# Patient Record
Sex: Female | Born: 1985 | Race: Black or African American | Hispanic: No | Marital: Single | Smoking: Former smoker
Health system: Southern US, Community
[De-identification: ages and names within clinical notes are randomized; demographics above are authoritative.]

## PROBLEM LIST (undated history)

## (undated) DIAGNOSIS — E785 Hyperlipidemia, unspecified: Secondary | ICD-10-CM

## (undated) DIAGNOSIS — N179 Acute kidney failure, unspecified: Secondary | ICD-10-CM

## (undated) DIAGNOSIS — E119 Type 2 diabetes mellitus without complications: Secondary | ICD-10-CM

## (undated) DIAGNOSIS — K859 Acute pancreatitis without necrosis or infection, unspecified: Secondary | ICD-10-CM

## (undated) DIAGNOSIS — I10 Essential (primary) hypertension: Secondary | ICD-10-CM

## (undated) HISTORY — PX: FOOT SURGERY: SHX648

---
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
## 2007-04-11 ENCOUNTER — Emergency Department (HOSPITAL_COMMUNITY): Admission: EM | Admit: 2007-04-11 | Discharge: 2007-04-11 | Payer: Self-pay | Admitting: Family Medicine

## 2007-11-25 ENCOUNTER — Emergency Department (HOSPITAL_COMMUNITY): Admission: EM | Admit: 2007-11-25 | Discharge: 2007-11-25 | Payer: Self-pay | Admitting: Emergency Medicine

## 2008-01-02 ENCOUNTER — Emergency Department (HOSPITAL_COMMUNITY): Admission: EM | Admit: 2008-01-02 | Discharge: 2008-01-02 | Payer: Self-pay | Admitting: Emergency Medicine

## 2008-03-01 ENCOUNTER — Emergency Department (HOSPITAL_COMMUNITY): Admission: EM | Admit: 2008-03-01 | Discharge: 2008-03-01 | Payer: Self-pay | Admitting: Emergency Medicine

## 2008-07-20 ENCOUNTER — Emergency Department (HOSPITAL_COMMUNITY): Admission: EM | Admit: 2008-07-20 | Discharge: 2008-07-20 | Payer: Self-pay | Admitting: Emergency Medicine

## 2008-08-02 ENCOUNTER — Emergency Department (HOSPITAL_COMMUNITY): Admission: EM | Admit: 2008-08-02 | Discharge: 2008-08-02 | Payer: Self-pay | Admitting: Emergency Medicine

## 2009-02-05 ENCOUNTER — Emergency Department (HOSPITAL_COMMUNITY): Admission: EM | Admit: 2009-02-05 | Discharge: 2009-02-06 | Payer: Self-pay | Admitting: Emergency Medicine

## 2009-02-24 ENCOUNTER — Emergency Department (HOSPITAL_COMMUNITY): Admission: EM | Admit: 2009-02-24 | Discharge: 2009-02-24 | Payer: Self-pay | Admitting: Emergency Medicine

## 2009-03-07 ENCOUNTER — Emergency Department (HOSPITAL_COMMUNITY): Admission: EM | Admit: 2009-03-07 | Discharge: 2009-03-07 | Payer: Self-pay | Admitting: Emergency Medicine

## 2009-10-21 ENCOUNTER — Emergency Department (HOSPITAL_COMMUNITY): Admission: EM | Admit: 2009-10-21 | Discharge: 2009-10-21 | Payer: Self-pay | Admitting: Emergency Medicine

## 2009-12-31 ENCOUNTER — Emergency Department (HOSPITAL_COMMUNITY)
Admission: EM | Admit: 2009-12-31 | Discharge: 2009-12-31 | Payer: Self-pay | Source: Home / Self Care | Admitting: Emergency Medicine

## 2010-03-20 ENCOUNTER — Ambulatory Visit: Payer: Self-pay | Admitting: Obstetrics and Gynecology

## 2010-03-25 LAB — URINALYSIS, ROUTINE W REFLEX MICROSCOPIC
Ketones, ur: NEGATIVE mg/dL
Nitrite: NEGATIVE
Specific Gravity, Urine: 1.015 (ref 1.005–1.030)

## 2010-03-25 LAB — URINE MICROSCOPIC-ADD ON

## 2010-03-25 LAB — PREGNANCY, URINE: Preg Test, Ur: NEGATIVE

## 2010-03-28 LAB — URINALYSIS, ROUTINE W REFLEX MICROSCOPIC
Glucose, UA: NEGATIVE mg/dL
Ketones, ur: NEGATIVE mg/dL
Nitrite: NEGATIVE
Protein, ur: NEGATIVE mg/dL
Specific Gravity, Urine: 1.017 (ref 1.005–1.030)
pH: 6 (ref 5.0–8.0)

## 2010-03-28 LAB — URINE MICROSCOPIC-ADD ON

## 2010-03-31 LAB — URINALYSIS, ROUTINE W REFLEX MICROSCOPIC
Bilirubin Urine: NEGATIVE
Glucose, UA: NEGATIVE mg/dL
Ketones, ur: NEGATIVE mg/dL
Protein, ur: >300 mg/dL — AB
Specific Gravity, Urine: 1.018 (ref 1.005–1.030)
pH: 6.5 (ref 5.0–8.0)

## 2010-04-03 LAB — URINALYSIS, ROUTINE W REFLEX MICROSCOPIC
Bilirubin Urine: NEGATIVE
Glucose, UA: NEGATIVE mg/dL
Ketones, ur: NEGATIVE mg/dL
Protein, ur: NEGATIVE mg/dL
Specific Gravity, Urine: 1.013 (ref 1.005–1.030)
Urobilinogen, UA: 0.2 mg/dL (ref 0.0–1.0)
pH: 7 (ref 5.0–8.0)

## 2010-04-03 LAB — POCT PREGNANCY, URINE: Preg Test, Ur: NEGATIVE

## 2010-04-21 LAB — RAPID STREP SCREEN (MED CTR MEBANE ONLY): Streptococcus, Group A Screen (Direct): NEGATIVE

## 2010-04-26 ENCOUNTER — Ambulatory Visit: Payer: Self-pay | Admitting: Obstetrics & Gynecology

## 2010-10-07 LAB — POCT URINALYSIS DIP (DEVICE)
Bilirubin Urine: NEGATIVE
Glucose, UA: NEGATIVE
Ketones, ur: NEGATIVE
Nitrite: NEGATIVE
Operator id: 235561
Specific Gravity, Urine: 1.02
Urobilinogen, UA: 0.2

## 2010-10-15 LAB — POCT I-STAT, CHEM 8
BUN: 12
Calcium, Ion: 1.18
Chloride: 102
Creatinine, Ser: 1
Glucose, Bld: 102 — ABNORMAL HIGH
HCT: 44
Hemoglobin: 15
Potassium: 3.5
Sodium: 139
TCO2: 27

## 2010-10-15 LAB — CBC
HCT: 41.2
Hemoglobin: 13.8
RBC: 4.47
RDW: 13.7

## 2010-10-15 LAB — DIFFERENTIAL
Basophils Absolute: 0.1
Basophils Relative: 0
Eosinophils Absolute: 0.2
Eosinophils Relative: 2
Lymphocytes Relative: 21
Lymphs Abs: 2.5
Monocytes Absolute: 0.9
Monocytes Relative: 7
Neutro Abs: 8.4 — ABNORMAL HIGH
Neutrophils Relative %: 70

## 2010-10-15 LAB — URINALYSIS, ROUTINE W REFLEX MICROSCOPIC
Ketones, ur: NEGATIVE
Protein, ur: >300 — AB
Urobilinogen, UA: 1

## 2010-10-15 LAB — POCT PREGNANCY, URINE: Preg Test, Ur: NEGATIVE

## 2010-10-15 LAB — URINE MICROSCOPIC-ADD ON

## 2010-10-15 LAB — POCT CARDIAC MARKERS: Troponin i, poc: <0.05

## 2011-02-08 ENCOUNTER — Encounter (HOSPITAL_COMMUNITY): Payer: Self-pay | Admitting: Emergency Medicine

## 2011-02-08 ENCOUNTER — Emergency Department (HOSPITAL_COMMUNITY)
Admission: EM | Admit: 2011-02-08 | Discharge: 2011-02-08 | Disposition: A | Discharge status: Home / Self Care | Payer: Self-pay | Attending: Emergency Medicine | Admitting: Emergency Medicine

## 2011-02-08 DIAGNOSIS — K047 Periapical abscess without sinus: Secondary | ICD-10-CM | POA: Insufficient documentation

## 2011-02-08 DIAGNOSIS — K029 Dental caries, unspecified: Secondary | ICD-10-CM | POA: Insufficient documentation

## 2011-02-08 DIAGNOSIS — K089 Disorder of teeth and supporting structures, unspecified: Secondary | ICD-10-CM | POA: Insufficient documentation

## 2011-02-08 DIAGNOSIS — K0889 Other specified disorders of teeth and supporting structures: Secondary | ICD-10-CM

## 2011-02-08 DIAGNOSIS — R220 Localized swelling, mass and lump, head: Secondary | ICD-10-CM | POA: Insufficient documentation

## 2011-02-08 MED ORDER — HYDROCODONE-ACETAMINOPHEN 5-500 MG PO TABS: 1.0000 | ORAL_TABLET | Freq: Four times a day (QID) | ORAL | Status: AC | PRN

## 2011-02-08 MED ORDER — AMOXICILLIN 500 MG PO CAPS: 500.0000 mg | ORAL_CAPSULE | Freq: Three times a day (TID) | ORAL | Status: AC

## 2011-02-08 NOTE — ED Provider Notes (Signed)
History     CSN: [id number]  Arrival date & time 02/08/11  1356   First MD Initiated Contact with Patient 02/08/11 1426      Chief Complaint  Patient presents with  . Dental Pain    (Consider location/radiation/quality/duration/timing/severity/associated sxs/prior treatment) Patient is a 26 y.o. female presenting with tooth pain. The history is provided by the patient.  Dental PainPrimary symptoms do not include headaches, fever, shortness of breath or sore throat.  Additional symptoms do not include: trouble swallowing.  pt c/o right lower dental pain posteriorly, constant for past couple weeks although similar pain for past couple months. Has no local dentist. No injury. No facial redness or swelling. No neck pain. No trouble breathing or swallowing. Pain constant, dull, non radiating worse w eating, exposure to liquids/cold.  No fever/ c hills.   History reviewed. No pertinent past medical history.  No past surgical history on file.  No family history on file.  History  Substance Use Topics  . Smoking status: Not on file  . Smokeless tobacco: Not on file  . Alcohol Use: Not on file    OB History    Grav Para Term Preterm Abortions TAB SAB Ect Mult Living                  Review of Systems  Constitutional: Negative for fever and chills.  HENT: Negative for sore throat and trouble swallowing.   Respiratory: Negative for shortness of breath.   Skin: Negative for rash.  Neurological: Negative for headaches.    Allergies  Review of patient's allergies indicates no known allergies.  Home Medications   Current Outpatient Rx  Name Route Sig Dispense Refill  . ACETAMINOPHEN 500 MG PO TABS Oral Take 500 mg by mouth every 6 (six) hours as needed. For pain    . BC HEADACHE POWDER PO Oral Take 1 packet by mouth daily as needed. For tooth pain      BP 130/91  Pulse 84  Temp(Src) 98.3 F (36.8 C) (Oral)  Resp 17  SpO2 97%  LMP 01/05/2011  Physical Exam  Nursing  note and vitals reviewed. Constitutional: She is oriented to person, place, and time. She appears well-developed and well-nourished. No distress.  HENT:  Right Ear: External ear normal.  Mouth/Throat: Oropharynx is clear and moist.       Right post molar decay, tenderness, assoc gum swelling and tenderness. No trismus. No floor of mouth, facial, or neck swelling or tenderness.   Eyes: Conjunctivae are normal. No scleral icterus.  Neck: Neck supple. No tracheal deviation present.  Cardiovascular: Normal rate.   Pulmonary/Chest: Effort normal. No respiratory distress.  Abdominal: Normal appearance.  Musculoskeletal: She exhibits no edema.  Neurological: She is alert and oriented to person, place, and time.       Steady gait  Skin: Skin is warm and dry. No rash noted.  Psychiatric: She has a normal mood and affect.    ED Course  Procedures (including critical care time)     MDM  Confirmed nkda w pt. Discussed importance prompt dental follow up.         Suzi Roots, MD 02/08/11 1440

## 2011-02-08 NOTE — ED Notes (Signed)
Pt. Stated, I have an infection in my teeth

## 2013-06-25 ENCOUNTER — Emergency Department (HOSPITAL_COMMUNITY)
Admission: EM | Admit: 2013-06-25 | Discharge: 2013-06-26 | Payer: BC Managed Care – PPO | Attending: Emergency Medicine | Admitting: Emergency Medicine

## 2013-06-25 ENCOUNTER — Emergency Department (HOSPITAL_COMMUNITY): Payer: BC Managed Care – PPO

## 2013-06-25 ENCOUNTER — Encounter (HOSPITAL_COMMUNITY): Payer: Self-pay | Admitting: Emergency Medicine

## 2013-06-25 DIAGNOSIS — S99919A Unspecified injury of unspecified ankle, initial encounter: Secondary | ICD-10-CM

## 2013-06-25 DIAGNOSIS — T23231A Burn of second degree of multiple right fingers (nail), not including thumb, initial encounter: Secondary | ICD-10-CM

## 2013-06-25 DIAGNOSIS — T23239A Burn of second degree of unspecified multiple fingers (nail), not including thumb, initial encounter: Secondary | ICD-10-CM | POA: Insufficient documentation

## 2013-06-25 DIAGNOSIS — Y93G9 Activity, other involving cooking and grilling: Secondary | ICD-10-CM | POA: Insufficient documentation

## 2013-06-25 DIAGNOSIS — S8990XA Unspecified injury of unspecified lower leg, initial encounter: Secondary | ICD-10-CM | POA: Insufficient documentation

## 2013-06-25 DIAGNOSIS — M25561 Pain in right knee: Secondary | ICD-10-CM

## 2013-06-25 DIAGNOSIS — S99929A Unspecified injury of unspecified foot, initial encounter: Secondary | ICD-10-CM

## 2013-06-25 DIAGNOSIS — Z79899 Other long term (current) drug therapy: Secondary | ICD-10-CM | POA: Insufficient documentation

## 2013-06-25 DIAGNOSIS — W06XXXA Fall from bed, initial encounter: Secondary | ICD-10-CM | POA: Insufficient documentation

## 2013-06-25 DIAGNOSIS — X028XXA Other exposure to controlled fire in building or structure, initial encounter: Secondary | ICD-10-CM | POA: Insufficient documentation

## 2013-06-25 DIAGNOSIS — Y9289 Other specified places as the place of occurrence of the external cause: Secondary | ICD-10-CM | POA: Insufficient documentation

## 2013-06-25 MED ORDER — HYDROMORPHONE HCL PF 1 MG/ML IJ SOLN
1.0000 mg | Freq: Once | INTRAMUSCULAR | Status: AC
  Administered 2013-06-25: 1 mg via INTRAVENOUS
  Filled 2013-06-25: qty 1
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
## 2013-06-25 NOTE — ED Notes (Signed)
Patient transported to X-ray 
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
## 2013-06-26 ENCOUNTER — Emergency Department (HOSPITAL_COMMUNITY): Payer: BC Managed Care – PPO

## 2013-06-26 ENCOUNTER — Emergency Department (HOSPITAL_COMMUNITY)
Admission: EM | Admit: 2013-06-26 | Discharge: 2013-06-26 | Disposition: A | Discharge status: Home / Self Care | Payer: BC Managed Care – PPO | Attending: Emergency Medicine | Admitting: Emergency Medicine

## 2013-06-26 ENCOUNTER — Encounter (HOSPITAL_COMMUNITY): Payer: Self-pay | Admitting: Emergency Medicine

## 2013-06-26 DIAGNOSIS — G8911 Acute pain due to trauma: Secondary | ICD-10-CM | POA: Insufficient documentation

## 2013-06-26 DIAGNOSIS — S99919A Unspecified injury of unspecified ankle, initial encounter: Secondary | ICD-10-CM | POA: Diagnosis not present

## 2013-06-26 DIAGNOSIS — Y9289 Other specified places as the place of occurrence of the external cause: Secondary | ICD-10-CM | POA: Diagnosis not present

## 2013-06-26 DIAGNOSIS — Z79899 Other long term (current) drug therapy: Secondary | ICD-10-CM | POA: Diagnosis not present

## 2013-06-26 DIAGNOSIS — F17200 Nicotine dependence, unspecified, uncomplicated: Secondary | ICD-10-CM | POA: Insufficient documentation

## 2013-06-26 DIAGNOSIS — Y93G9 Activity, other involving cooking and grilling: Secondary | ICD-10-CM | POA: Diagnosis not present

## 2013-06-26 DIAGNOSIS — M25559 Pain in unspecified hip: Secondary | ICD-10-CM | POA: Insufficient documentation

## 2013-06-26 DIAGNOSIS — M25552 Pain in left hip: Secondary | ICD-10-CM

## 2013-06-26 DIAGNOSIS — W06XXXA Fall from bed, initial encounter: Secondary | ICD-10-CM | POA: Diagnosis not present

## 2013-06-26 DIAGNOSIS — X028XXA Other exposure to controlled fire in building or structure, initial encounter: Secondary | ICD-10-CM | POA: Diagnosis not present

## 2013-06-26 DIAGNOSIS — T23009A Burn of unspecified degree of unspecified hand, unspecified site, initial encounter: Secondary | ICD-10-CM | POA: Diagnosis present

## 2013-06-26 DIAGNOSIS — T23239A Burn of second degree of unspecified multiple fingers (nail), not including thumb, initial encounter: Secondary | ICD-10-CM | POA: Diagnosis not present

## 2013-06-26 MED ORDER — IBUPROFEN 600 MG PO TABS: 600.0000 mg | ORAL_TABLET | Freq: Four times a day (QID) | ORAL | Status: DC | PRN

## 2013-06-26 MED ORDER — HYDROCODONE-ACETAMINOPHEN 5-325 MG PO TABS: 2.0000 | ORAL_TABLET | ORAL | Status: DC | PRN

## 2013-06-26 MED ORDER — OXYCODONE-ACETAMINOPHEN 5-325 MG PO TABS
2.0000 | ORAL_TABLET | Freq: Once | ORAL | Status: AC
  Administered 2013-06-26: 2 via ORAL
  Filled 2013-06-26: qty 2

## 2013-06-26 MED ORDER — SODIUM CHLORIDE 0.9 % IV SOLN
Freq: Once | INTRAVENOUS | Status: AC
  Administered 2013-06-26: via INTRAVENOUS

## 2013-06-26 MED ORDER — OXYCODONE-ACETAMINOPHEN 5-325 MG PO TABS: 1.0000 | ORAL_TABLET | ORAL | Status: DC | PRN
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
## 2013-06-26 NOTE — ED Notes (Signed)
Bed: IF53 Expected date:  Expected time:  Means of arrival:  Comments: EMS- hip pain, nonambulatory
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
## 2013-06-28 ENCOUNTER — Emergency Department (HOSPITAL_COMMUNITY)
Admission: EM | Admit: 2013-06-28 | Discharge: 2013-06-28 | Disposition: A | Discharge status: Home / Self Care | Payer: BC Managed Care – PPO | Attending: Emergency Medicine | Admitting: Emergency Medicine

## 2013-06-28 ENCOUNTER — Encounter (HOSPITAL_COMMUNITY): Payer: Self-pay | Admitting: Emergency Medicine

## 2013-06-28 DIAGNOSIS — R200 Anesthesia of skin: Secondary | ICD-10-CM

## 2013-06-28 DIAGNOSIS — Z87828 Personal history of other (healed) physical injury and trauma: Secondary | ICD-10-CM | POA: Insufficient documentation

## 2013-06-28 DIAGNOSIS — Z79899 Other long term (current) drug therapy: Secondary | ICD-10-CM | POA: Insufficient documentation

## 2013-06-28 DIAGNOSIS — F17200 Nicotine dependence, unspecified, uncomplicated: Secondary | ICD-10-CM | POA: Insufficient documentation

## 2013-06-28 DIAGNOSIS — R208 Other disturbances of skin sensation: Secondary | ICD-10-CM

## 2013-06-28 DIAGNOSIS — R209 Unspecified disturbances of skin sensation: Secondary | ICD-10-CM | POA: Insufficient documentation
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
## 2013-06-28 NOTE — ED Provider Notes (Signed)
CSN: [id number]     Arrival date & time 06/28/13  1121 History   First MD Initiated Contact with Patient 06/28/13 1317     Chief Complaint  Patient presents with  . Foot Pain     (Consider location/radiation/quality/duration/timing/severity/associated sxs/prior Treatment) HPI 28 year old female presents with 2 days of left foot numbness and weakness. She fell out of bed a couple days ago and states this started then. She's been seen ED twice for persistent left hip pain. She twice had negative hip x-rays. She states her hip is not bothering her now. She is unable to walk due to the weakness in her foot. She points to her entire bottom foot as well as diffusely on the top of her foot up to a few centimeters before her ankle joint. She feels is mildly swollen compared to the right as well. She denies any back pain or back injury. No bowel or bladder incontinence. No numbness or tingling in any other part of her leg.  History reviewed. No pertinent past medical history. History reviewed. No pertinent past surgical history. History reviewed. No pertinent family history. History  Substance Use Topics  . Smoking status: Current Every Day Smoker  . Smokeless tobacco: Not on file  . Alcohol Use: Yes     Comment: occasionally   OB History   Grav Para Term Preterm Abortions TAB SAB Ect Mult Living                 Review of Systems  Genitourinary:       No bowel or bladder incontinence  Musculoskeletal: Positive for joint swelling. Negative for back pain.  Neurological: Positive for weakness and numbness.  All other systems reviewed and are negative.     Allergies  Review of patient's allergies indicates no known allergies.  Home Medications   Prior to Admission medications   Medication Sig Start Date End Date Taking? Authorizing Provider  Multiple Vitamin (MULTIVITAMIN WITH MINERALS) TABS tablet Take 1 tablet by mouth daily.   Yes Historical Provider, MD  oxyCODONE-acetaminophen  (PERCOCET/ROXICET) 5-325 MG per tablet Take 1 tablet by mouth every 4 (four) hours as needed for severe pain. 06/26/13  Yes Hoy Morn, MD  vitamin B-12 (CYANOCOBALAMIN) 1000 MCG tablet Take 1,000 mcg by mouth daily.   Yes Historical Provider, MD  ibuprofen (ADVIL,MOTRIN) 600 MG tablet Take 1 tablet (600 mg total) by mouth every 6 (six) hours as needed. 06/26/13   Tatyana A Kirichenko, PA-C   BP 138/99  Pulse 86  Temp(Src) 98.5 F (36.9 C) (Oral)  Resp 20  SpO2 99%  LMP 05/27/2013 Physical Exam  Nursing note and vitals reviewed. Constitutional: She is oriented to person, place, and time. She appears well-developed and well-nourished. No distress.  HENT:  Head: Normocephalic and atraumatic.  Right Ear: External ear normal.  Left Ear: External ear normal.  Nose: Nose normal.  Eyes: Right eye exhibits no discharge. Left eye exhibits no discharge.  Cardiovascular: Normal rate, regular rhythm and normal heart sounds.   Pulses:      Popliteal pulses are 2+ on the right side, and 2+ on the left side.       Dorsalis pedis pulses are 2+ on the right side, and 2+ on the left side.  Pulmonary/Chest: Effort normal and breath sounds normal.  Abdominal: Soft. She exhibits no distension. There is no tenderness.  Musculoskeletal:       Left foot: She exhibits no tenderness.  Neurological: She is alert and oriented  to person, place, and time. She displays no Babinski's sign on the right side. She displays no Babinski's sign on the left side.  Reflex Scores:      Achilles reflexes are 2+ on the right side and 2+ on the left side. Subjective numbness to left foot. When touched with sharp object she initially jumps and then is able to tell sharp from dull. When doing babinski sign her toes curl down and she pulls her foot away because it is ticklish. Able to move toes but "less" on left compared to right. Decreased ROM with dorsiflexion compared to right. Normal plantar flexion  Skin: Skin is warm and  dry.    ED Course  Procedures (including critical care time) Labs Review Labs Reviewed - No data to display  Imaging Review Dg Hip Complete Left  06/26/2013   CLINICAL DATA:  Fall, left hip pain.  EXAM: LEFT HIP - COMPLETE 2+ VIEW  COMPARISON:  None.  FINDINGS: Hip joints and SI joints are symmetric and unremarkable. No acute bony abnormality. Specifically, no fracture, subluxation, or dislocation. Soft tissues are intact.  IMPRESSION: Negative.   Electronically Signed   By: Rolm Baptise M.D.   On: 06/26/2013 14:45     EKG Interpretation None      MDM   Final diagnoses:  Numbness of left foot    Patient with diffuse left foot numbness and weakness after an injury. No signs of an acute traumatic back injury or concern for spinal cord injury causing the symptoms. Her reflexes are normal. She endorses subjective numbness but on exam she is able to tell sharp from dull. She does have some decreased range of motion with dorsiflexion compared to the right side. I discussed her findings with orthopedic on call, Dr. Lorin Mercy, but this time recommends conservative treatment and followup in his office in about a week. Patient understands this and will followup as directed. At this time there is no indication of any signs of compartment syndrome, acute arterial injury, or obvious neuro injury. She has been propping her foot up to help with some swelling. She occasionally puts a pillow underneath her knee, and I recommended she not put underneath her knee as this could compress I nerve that could possibly symptoms.    Ephraim Hamburger, MD 06/28/13 [phone]

## 2013-07-04 DIAGNOSIS — T300 Burn of unspecified body region, unspecified degree: Secondary | ICD-10-CM | POA: Insufficient documentation

## 2013-07-26 ENCOUNTER — Encounter (HOSPITAL_COMMUNITY): Payer: Self-pay | Admitting: Emergency Medicine

## 2013-07-26 ENCOUNTER — Emergency Department (HOSPITAL_COMMUNITY)
Admission: EM | Admit: 2013-07-26 | Discharge: 2013-07-26 | Disposition: A | Discharge status: Home / Self Care | Payer: BC Managed Care – PPO | Attending: Emergency Medicine | Admitting: Emergency Medicine

## 2013-07-26 DIAGNOSIS — M25579 Pain in unspecified ankle and joints of unspecified foot: Secondary | ICD-10-CM | POA: Insufficient documentation

## 2013-07-26 DIAGNOSIS — F17200 Nicotine dependence, unspecified, uncomplicated: Secondary | ICD-10-CM | POA: Insufficient documentation

## 2013-07-26 DIAGNOSIS — Z79899 Other long term (current) drug therapy: Secondary | ICD-10-CM | POA: Insufficient documentation

## 2013-07-26 DIAGNOSIS — M25572 Pain in left ankle and joints of left foot: Secondary | ICD-10-CM

## 2013-07-26 MED ORDER — NAPROXEN 250 MG PO TABS
500.0000 mg | ORAL_TABLET | Freq: Once | ORAL | Status: AC
  Administered 2013-07-26: 500 mg via ORAL
  Filled 2013-07-26: qty 2

## 2013-07-26 MED ORDER — MELOXICAM 15 MG PO TABS: 15.0000 mg | ORAL_TABLET | Freq: Every day | ORAL | Status: DC
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
## 2013-07-26 NOTE — ED Provider Notes (Signed)
CSN: [id number]     Arrival date & time 07/26/13  1443 History  This chart was scribed for non-physician practitioner, Alvina Chou, PA-C working with Virgel Manifold, MD by Frederich Balding, ED scribe. This patient was seen in room TR07C/TR07C and the patient's care was started at 3:53 PM.    Chief Complaint  Patient presents with  . Ankle Pain   The history is provided by the patient. No language interpreter was used.   HPI Comments: Yolanda Flores is a 28 y.o. female who presents to the Emergency Department complaining of continued left ankle pain that worsened 3-4 days ago. States she initially sprained it on 06/25/13 and was given percocet. She was evaluated at an orthopedist and given gabapentin. Pt is now out of both medications and does not have another orthopedic appointment until 08/12/13. Bearing weight and certain movements worsen the pain.   History reviewed. No pertinent past medical history. History reviewed. No pertinent past surgical history. History reviewed. No pertinent family history. History  Substance Use Topics  . Smoking status: Current Every Day Smoker  . Smokeless tobacco: Not on file  . Alcohol Use: Yes     Comment: occasionally   OB History   Grav Para Term Preterm Abortions TAB SAB Ect Mult Living                 Review of Systems  Musculoskeletal: Positive for arthralgias.  All other systems reviewed and are negative.  Allergies  Review of patient's allergies indicates no known allergies.  Home Medications   Prior to Admission medications   Medication Sig Start Date End Date Taking? Authorizing Provider  ibuprofen (ADVIL,MOTRIN) 600 MG tablet Take 1 tablet (600 mg total) by mouth every 6 (six) hours as needed. 06/26/13   Tatyana A Kirichenko, PA-C  Multiple Vitamin (MULTIVITAMIN WITH MINERALS) TABS tablet Take 1 tablet by mouth daily.    Historical Provider, MD  oxyCODONE-acetaminophen (PERCOCET/ROXICET) 5-325 MG per tablet Take 1 tablet by mouth  every 4 (four) hours as needed for severe pain. 06/26/13   Hoy Morn, MD  vitamin B-12 (CYANOCOBALAMIN) 1000 MCG tablet Take 1,000 mcg by mouth daily.    Historical Provider, MD   BP 142/121  Pulse 64  Resp 22  SpO2 99%  Physical Exam  Nursing note and vitals reviewed. Constitutional: She is oriented to person, place, and time. She appears well-developed and well-nourished. No distress.  HENT:  Head: Normocephalic and atraumatic.  Eyes: Conjunctivae and EOM are normal.  Neck: Neck supple. No tracheal deviation present.  Cardiovascular: Normal rate.   Pulmonary/Chest: Effort normal. No respiratory distress.  Musculoskeletal: Normal range of motion.  Left lateral ankle tenderness to palpation. No obvious deformity. Limited ROM due to pain.   Neurological: She is alert and oriented to person, place, and time.  Skin: Skin is warm and dry.  Psychiatric: She has a normal mood and affect. Her behavior is normal.    ED Course  Procedures (including critical care time)  DIAGNOSTIC STUDIES: Oxygen Saturation is 99% on RA, normal by my interpretation.    COORDINATION OF CARE: 3:56 PM-Discussed treatment plan which includes RICE and a different pain medication with pt at bedside and pt agreed to plan. Advised her to keep her orthopedic follow up appointment.   Labs Review Labs Reviewed - No data to display  Imaging Review No results found.   EKG Interpretation None      MDM   Final diagnoses:  Left ankle pain  4:04 PM No new injury. Patient will have Mobic for pain. Patient instructed to follow up with Orthopedics. Vitals stable and patient afebrile.   I personally performed the services described in this documentation, which was scribed in my presence. The recorded information has been reviewed and is accurate.  Alvina Chou, PA-C 07/26/13 1604

## 2013-07-26 NOTE — Discharge Instructions (Signed)
Take Mobic as needed for pain. Continue to ice and elevate your ankle. Refer to attached documents for more information.

## 2013-07-28 NOTE — ED Provider Notes (Signed)
Medical screening examination/treatment/procedure(s) were performed by non-physician practitioner and as supervising physician I was immediately available for consultation/collaboration.   EKG Interpretation None       Virgel Manifold, MD 07/28/13 [phone]

## 2013-08-03 ENCOUNTER — Other Ambulatory Visit: Payer: Self-pay | Admitting: Orthopaedic Surgery

## 2013-08-03 DIAGNOSIS — M79672 Pain in left foot: Secondary | ICD-10-CM

## 2013-08-04 ENCOUNTER — Ambulatory Visit
Admission: RE | Admit: 2013-08-04 | Discharge: 2013-08-04 | Disposition: A | Discharge status: Home / Self Care | Payer: BC Managed Care – PPO | Source: Ambulatory Visit | Attending: Orthopaedic Surgery | Admitting: Orthopaedic Surgery

## 2013-08-04 DIAGNOSIS — M79672 Pain in left foot: Secondary | ICD-10-CM

## 2013-08-22 ENCOUNTER — Other Ambulatory Visit: Payer: Self-pay | Admitting: Orthopaedic Surgery

## 2013-08-22 DIAGNOSIS — R609 Edema, unspecified: Secondary | ICD-10-CM

## 2013-08-22 DIAGNOSIS — R52 Pain, unspecified: Secondary | ICD-10-CM

## 2013-08-23 ENCOUNTER — Ambulatory Visit
Admission: RE | Admit: 2013-08-23 | Discharge: 2013-08-23 | Disposition: A | Discharge status: Home / Self Care | Payer: BC Managed Care – PPO | Source: Ambulatory Visit | Attending: Orthopaedic Surgery | Admitting: Orthopaedic Surgery

## 2013-08-23 DIAGNOSIS — R52 Pain, unspecified: Secondary | ICD-10-CM

## 2013-08-23 DIAGNOSIS — R609 Edema, unspecified: Secondary | ICD-10-CM

## 2013-10-14 ENCOUNTER — Emergency Department (HOSPITAL_COMMUNITY)
Admission: EM | Admit: 2013-10-14 | Discharge: 2013-10-14 | Disposition: A | Discharge status: Home / Self Care | Payer: BC Managed Care – PPO | Attending: Emergency Medicine | Admitting: Emergency Medicine

## 2013-10-14 ENCOUNTER — Encounter (HOSPITAL_COMMUNITY): Payer: Self-pay | Admitting: Emergency Medicine

## 2013-10-14 DIAGNOSIS — M79672 Pain in left foot: Secondary | ICD-10-CM | POA: Diagnosis present

## 2013-10-14 DIAGNOSIS — Z720 Tobacco use: Secondary | ICD-10-CM | POA: Diagnosis not present

## 2013-10-14 DIAGNOSIS — G8911 Acute pain due to trauma: Secondary | ICD-10-CM | POA: Diagnosis not present

## 2013-10-14 DIAGNOSIS — Z79899 Other long term (current) drug therapy: Secondary | ICD-10-CM | POA: Insufficient documentation

## 2013-10-14 MED ORDER — NAPROXEN 500 MG PO TABS: 500.0000 mg | ORAL_TABLET | Freq: Two times a day (BID) | ORAL | Status: DC

## 2013-10-14 MED ORDER — TRAMADOL HCL 50 MG PO TABS: 50.0000 mg | ORAL_TABLET | Freq: Four times a day (QID) | ORAL | Status: DC | PRN

## 2013-10-14 NOTE — ED Notes (Signed)
Pt in c/o left foot pain that has continued from an injury in June, pt states she is out of pain medication, denies new injury

## 2013-10-14 NOTE — ED Provider Notes (Signed)
CSN: [id number]     Arrival date & time 10/14/13  1606 History  This chart was scribed for non-physician practitioner, Etta Quill, NP, working with Babette Relic, MD, by Marlowe Kays, ED Scribe. This patient was seen in room TR07C/TR07C and the patient's care was started at 4:29 PM.  Chief Complaint  Patient presents with  . Foot Pain   Patient is a 28 y.o. female presenting with lower extremity pain. The history is provided by the patient. No language interpreter was used.  Foot Pain This is a chronic problem. The current episode started more than 1 week ago. The problem occurs constantly. The problem has not changed since onset.Pertinent negatives include no chest pain, no abdominal pain, no headaches and no shortness of breath.   HPI Comments:  Yolanda Flores is a 28 y.o. female who presents to the Emergency Department complaining of severe left foot pain around the bases of all the toes that began approximately four months ago secondary to falling. She states she has seen Dr. Erlinda Hong and has had an MRI and an ultrasound. Pt reports associated moderate swelling and dry skin of the foot. She presents with a cam walker boot in place. She is unable to specify any worsening factors. Pt reports that she is out of pain medication and has been taking Ibuprofen with no significant relief. She has been taking Gabapentin as prescribed with mild relief of the pain. Pt reports resting, icing and elevating the foot. She states extension of the leg helps to ease the pain moderately. She denies numbness, tingling or redness of the foot. She has an appt to follow up Dr. Erlinda Hong in two weeks. She denies h/o DM. Denies any new trauma, injury or fall.  History reviewed. No pertinent past medical history. History reviewed. No pertinent past surgical history. History reviewed. No pertinent family history. History  Substance Use Topics  . Smoking status: Current Every Day Smoker  . Smokeless tobacco: Not on file  .  Alcohol Use: Yes     Comment: occasionally   OB History   Grav Para Term Preterm Abortions TAB SAB Ect Mult Living                 Review of Systems  Respiratory: Negative for shortness of breath.   Cardiovascular: Negative for chest pain.  Gastrointestinal: Negative for abdominal pain.  Musculoskeletal: Positive for arthralgias.  Skin: Negative for color change and wound.  Neurological: Negative for numbness and headaches.  All other systems reviewed and are negative.   Allergies  Review of patient's allergies indicates no known allergies.  Home Medications   Prior to Admission medications   Medication Sig Start Date End Date Taking? Authorizing Provider  CALCIUM PO Take 1 tablet by mouth daily.   Yes Historical Provider, MD  gabapentin (NEURONTIN) 100 MG capsule Take 100 mg by mouth 3 (three) times daily.   Yes Historical Provider, MD  ibuprofen (ADVIL,MOTRIN) 600 MG tablet Take 1 tablet (600 mg total) by mouth every 6 (six) hours as needed. 06/26/13  Yes Tatyana A Kirichenko, PA-C  Multiple Vitamin (MULTIVITAMIN WITH MINERALS) TABS tablet Take 1 tablet by mouth daily.   Yes Historical Provider, MD   Triage Vitals: BP 143/102  Pulse 99  Temp(Src) 98.4 F (36.9 C) (Oral)  Resp 18  Ht 5\' 8"  (1.727 m)  Wt 147 lb (66.679 kg)  BMI 22.36 kg/m2  SpO2 99% Physical Exam  Nursing note and vitals reviewed. Constitutional: She is oriented to  person, place, and time. She appears well-developed and well-nourished.  HENT:  Head: Normocephalic and atraumatic.  Eyes: EOM are normal.  Neck: Normal range of motion.  Cardiovascular: Normal rate.   Capillary refill less than 2 seconds.  Pulmonary/Chest: Effort normal.  Musculoskeletal: Normal range of motion. She exhibits tenderness.  Neurological: She is alert and oriented to person, place, and time.  Distal sensation of left foot intact.  Skin: Skin is warm and dry. No erythema.  No erythema, edema, warmth or signs of infection.   Psychiatric: She has a normal mood and affect. Her behavior is normal.    ED Course  Procedures (including critical care time) DIAGNOSTIC STUDIES: Oxygen Saturation is 99% on RA, normal by my interpretation.   COORDINATION OF CARE: 4:38 PM- Will prescribe pain medication and advised to keep follow up appt with Dr. Erlinda Hong. Pt verbalizes understanding and agrees to plan.  Medications - No data to display  Labs Review Labs Reviewed - No data to display  Imaging Review No results found.   EKG Interpretation None      MDM   Final diagnoses:  None    Left foot pain.  I personally performed the services described in this documentation, which was scribed in my presence. The recorded information has been reviewed and is accurate.    Norman Herrlich, NP 10/15/13 [phone]

## 2013-10-17 NOTE — ED Provider Notes (Signed)
Medical screening examination/treatment/procedure(s) were performed by non-physician practitioner and as supervising physician I was immediately available for consultation/collaboration.   EKG Interpretation None       Babette Relic, MD 10/17/13 [phone]

## 2014-02-13 ENCOUNTER — Encounter (HOSPITAL_COMMUNITY): Payer: Self-pay | Admitting: Emergency Medicine

## 2014-02-13 ENCOUNTER — Emergency Department (HOSPITAL_COMMUNITY)
Admission: EM | Admit: 2014-02-13 | Discharge: 2014-02-13 | Disposition: A | Discharge status: Home / Self Care | Payer: BLUE CROSS/BLUE SHIELD | Attending: Emergency Medicine | Admitting: Emergency Medicine

## 2014-02-13 DIAGNOSIS — Z791 Long term (current) use of non-steroidal anti-inflammatories (NSAID): Secondary | ICD-10-CM | POA: Diagnosis not present

## 2014-02-13 DIAGNOSIS — S0502XA Injury of conjunctiva and corneal abrasion without foreign body, left eye, initial encounter: Secondary | ICD-10-CM | POA: Diagnosis not present

## 2014-02-13 DIAGNOSIS — Y998 Other external cause status: Secondary | ICD-10-CM | POA: Diagnosis not present

## 2014-02-13 DIAGNOSIS — Y9289 Other specified places as the place of occurrence of the external cause: Secondary | ICD-10-CM | POA: Insufficient documentation

## 2014-02-13 DIAGNOSIS — Z720 Tobacco use: Secondary | ICD-10-CM | POA: Insufficient documentation

## 2014-02-13 DIAGNOSIS — Z79899 Other long term (current) drug therapy: Secondary | ICD-10-CM | POA: Diagnosis not present

## 2014-02-13 DIAGNOSIS — Y9389 Activity, other specified: Secondary | ICD-10-CM | POA: Diagnosis not present

## 2014-02-13 DIAGNOSIS — X58XXXA Exposure to other specified factors, initial encounter: Secondary | ICD-10-CM | POA: Diagnosis not present

## 2014-02-13 DIAGNOSIS — R030 Elevated blood-pressure reading, without diagnosis of hypertension: Secondary | ICD-10-CM | POA: Insufficient documentation

## 2014-02-13 DIAGNOSIS — S0592XA Unspecified injury of left eye and orbit, initial encounter: Secondary | ICD-10-CM | POA: Diagnosis present

## 2014-02-13 MED ORDER — TETRACAINE HCL 0.5 % OP SOLN
1.0000 [drp] | Freq: Once | OPHTHALMIC | Status: AC
  Administered 2014-02-13: 1 [drp] via OPHTHALMIC
  Filled 2014-02-13: qty 2

## 2014-02-13 MED ORDER — ERYTHROMYCIN 5 MG/GM OP OINT: TOPICAL_OINTMENT | OPHTHALMIC | Status: DC

## 2014-02-13 MED ORDER — FLUORESCEIN SODIUM 1 MG OP STRP
1.0000 | ORAL_STRIP | Freq: Once | OPHTHALMIC | Status: AC
  Administered 2014-02-13: 1 via OPHTHALMIC
  Filled 2014-02-13: qty 1

## 2014-02-13 NOTE — ED Notes (Signed)
Pt c/o left eye pain onset yesterday, states eye was red and watering yesterday, today pt c/o burning pain.

## 2014-02-13 NOTE — Discharge Instructions (Signed)
Call for a follow up appointment with a Family or Primary Care Provider about your elevated blood pressure reading. Return if Symptoms worsen.   Take medication as prescribed.  Call for a follow-up appointment with an ophthalmologist or another eye specialist for further evaluation of your eye discomfort. Avoid scratching your eye.   Emergency Department Resource Guide 1) Find a Doctor and Pay Out of Pocket Although you won't have to find out who is covered by your insurance plan, it is a good idea to ask around and get recommendations. You will then need to call the office and see if the doctor you have chosen will accept you as a new patient and what types of options they offer for patients who are self-pay. Some doctors offer discounts or will set up payment plans for their patients who do not have insurance, but you will need to ask so you aren't surprised when you get to your appointment.  2) Contact Your Local Health Department Not all health departments have doctors that can see patients for sick visits, but many do, so it is worth a call to see if yours does. If you don't know where your local health department is, you can check in your phone book. The CDC also has a tool to help you locate your state's health department, and many state websites also have listings of all of their local health departments.  3) Find a [facility] If your illness is not likely to be very severe or complicated, you may want to try a walk in clinic. These are popping up all over the country in pharmacies, drugstores, and shopping centers. They're usually staffed by nurse practitioners or physician assistants that have been trained to treat common illnesses and complaints. They're usually fairly quick and inexpensive. However, if you have serious medical issues or chronic medical problems, these are probably not your best option.  No Primary Care Doctor: - Call Health Connect at  [phone] - they can help you  locate a primary care doctor that  accepts your insurance, provides certain services, etc. - Physician Referral Service- [phone]  Chronic Pain Problems: Organization         Address  Phone   Notes  [facility]  [phone] Patients need to be referred by their primary care doctor.   Medication Assistance: Organization         Address  Phone   Notes  [facility] Medication Medical [facility] [address] [place], [place] [address] [phone] --Must be a resident of [facility] -- Must have NO insurance coverage whatsoever (no Medicaid/ Medicare, etc.) -- The pt. MUST have a primary care doctor that directs their care regularly and follows them in the community   Med[facility]ist  [phone]   [employer]  [phone]    Agencies that provide inexpensive medical care: Organization         Address  Phone   Notes  [place]  [phone]   [facility]    [phone]   [facility] [place], [place] [address] [phone]   [facility] [address], [place] [phone]   Planned Parenthood    [phone]   [facility]    [phone]   [place] and [place] [address], [place] Phone:  [phone], Fax:  [phone] Hours of  Operation:  9 am - 6 pm, M-F.  Also accepts Medicaid/Medicare and self-pay.  [facility] for [place], [address], [place] Phone: [phone], Fax: [phone]. Hours of Operation:  8:30 am - 5:30 pm, M-F.  Also accepts Medicaid and self-pay.  [facility] [address] [place] Phone: [phone]   [place], [place], [place] [phone], Ext. [phone] Mondays & Thursdays: 7-9 AM.  First 15 patients are seen on a first come, first serve basis.    [place]  Providers:  Organization         Address  Phone   Notes  [facility] [address], [place] [phone] Also accepts self-pay patients.  [facility] [address], [place]  [phone]   [place], [address], [place] [phone]   [facility] Family Medicine [address] [place] [phone]   Lucianne Lei [address], [place]   [phone] Only accepts [place] Access [place] patients after they have their name applied to their card.   Self-Pay (no insurance) in [facility]:  Organization         Address  Phone   Notes  Sickle Cell Patients, [facility] Internal Medicine [place] [phone]   [facility] [place] [phone]   [facility] [place]  [place], [place], [place] [phone]   [facility]/Dr. Osei-Bonsu  [address] [place] or [address], [place] [phone] Phone number for both [place] and [place] locations is the same.  Urgent Medical and [facility] [address] [place] [phone]   [facility] [address] [place] or [address] [phone]   [facility] [address] [place] [phone], phone; [phone], fax Sees patients 1st and 3rd Saturday of every month.  Must not qualify for public or private insurance (i.e. Medicaid, Medicare, [place] Health Choice, Veterans' Benefits)  Household income should be no more than 200% of the poverty level The clinic cannot treat you if you are pregnant or think you are pregnant  Sexually transmitted diseases are not treated at the clinic.    Dental Care: Organization         Address  Phone  Notes  [facility] Department of [facility] [place] [phone] Accepts children up to age 91 who are enrolled in [place] or [place]; pregnant women with a Medicaid card; and children who have applied for Medicaid or [place] Health Choice, but were declined, whose parents can pay a reduced fee at time of service.  [facility] Department of [facility]  [address], [place] [phone] Accepts children up to age 110 who are enrolled in [place] or [place]; pregnant women with a Medicaid card; and children who have applied for Medicaid or [place] Health Choice, but were declined, whose parents can pay a reduced fee at time of service.  [facility] Adult [facility] Access PROGRAM  [place] [phone] Patients are seen by appointment only. Walk-ins are not accepted. [place] will see patients  37 years of age and older. Monday - Tuesday (8am-5pm) Most Wednesdays (8:30-5pm) $30 per visit, cash only  [facility] Adult Dental Access PROGRAM  [address], [facility] [phone] Patients are seen by appointment only. Walk-ins are not accepted. [place] will see patients 38 years of age and older. One Wednesday Evening (Monthly: Volunteer Based).  $30 per visit, cash only  [place]  [phone] for adults; Children under age 20, call Graduate Pediatric Dentistry at [phone]. Children aged 64-14, please call [phone] to request a pediatric application.  Dental services are provided in all areas of dental care including fillings, crowns and bridges, complete and partial dentures, implants, gum treatment, root canals, and extractions. Preventive care is also provided. Treatment is provided to both adults and children. Patients are selected via a lottery and there is often a waiting list.   [facility] [address] [place]  [phone] [email or web address]   [facility]  [address] [place], [place] [phone], Ext. [phone] Second and Fourth Thursday of each month, opens at 6:30 AM; Clinic ends at 9 AM.  Patients are seen on a first-come first-served basis, and a limited number are seen during each clinic.   [facility]  [address] Hillard Danker [place], [place] [phone]   Eligibility Requirements You must have lived in [place], [place], or [place] counties for at least the last three months.   You cannot be eligible for state or federal sponsored [employer], including [employer], [place], or [employer].   You generally cannot be eligible for healthcare insurance through your employer.    How to apply: Eligibility screenings are held every Tuesday and Wednesday afternoon from 1:00 pm until 4:00 pm. You do not need an appointment for the interview!  [facility] [address] [place], [place]   [place]  [place] Department  [place]  [phone]    Behavioral Health Resources in the Community: Intensive Outpatient Programs Organization         Address  Phone  Notes  [facility] [place]. [address] [place], [place] [phone]   [facility] Outpatient [address] [place], [place]   ADS: Alcohol & Drug Svcs [address] [place], [place]   [place] [address],  [place], [place] or [phone]   Substance Abuse Resources Organization         Address  Phone  Notes  Alcohol and Drug Services  [phone]   [place]  [phone]   [facility] [place]   Chinita Pester  [phone]   Residential & Outpatient Substance Abuse Program  [phone]   Psychological Services Organization         Address  Phone  Notes  [facility] [place]  [place]  [phone]   [place] [address], [place] or [phone]    Mobile Crisis Teams Organization         Address  Phone  Notes  [facility], Mobile Crisis Care Unit  [phone]   Assertive Psychotherapeutic Services  [address] [place], [place]   [facility] [address] [place] [phone]    Self-Help/Support Groups Organization  Address  Phone             Notes  [place]. of [place] - variety of support groups  [place]ien Call for more information  Narcotics Anonymous (NA), [facility] [address], [employer] [place]  2 meetings at this location   Special educational needs teacher         Address  Phone  Notes  ASAP Residential Treatment [place],    [place]  [phone]   [facility]  [address] [place] [address], [place], [place]   [place], [place] [phone] Admissions: 8am-3pm M-F  Incentives Substance [place] [address],    [place], [place] [phone]   [facility] [address] [place], [place], [place]   [facility] [address],  [place], [place]   [facility] - Intensive Outpatient [address], [place], [place]   [facility] [place].,  [place], [place] [phone] or [phone]   [facility]idential Treatment Services (RTS) [address], [place], [place] Accepts Medicaid  [facility] [address],  [place] [phone] Substance Abuse/Addiction Treatment   [facility] Organization         Address  Phone  Notes  [facility]  [phone]   Domenic Schwab, PhD [address] Arlis Porta [place], [place]   [phone] or [phone]    [address] [facility] [place], [place] [phone]   [facility] [address], [place], [place] [phone] Insurance/Medicaid/sponsorship through [facility] and Families [address], [address] [place]                                    [place], [place] [phone] [place] [address] [place], [place] [phone]    Dr. Adele Schilder  [phone]   [facility] Dept. 1) [address] [place] 2) [place] 3)  [place] [address], [place] [phone]  [phone]   [place] [phone] or [phone] (After Hours)

## 2014-02-13 NOTE — ED Provider Notes (Signed)
CSN: [id number]     Arrival date & time 02/13/14  1131 History  This chart was scribed for Harvie Heck, PA-C, working with Veryl Speak, MD by Marti Sleigh, ED Scribe. This patient was seen in room WTR6/WTR6 and the patient's care was started at 1:07 PM.     Chief Complaint  Patient presents with  . Eye Pain   HPI Comments: Yolanda Flores is a 29 y.o. female who presents to the Emergency Department complaining of left eye pain that started yesterday.  She reports a gradual onset of eye discomfort.  With associated blurry vision earlier, self resolved. Reports associated discharge, itchyness and redness. Pt reports matting of lids in morning. Pt states it feels like there is something in her eye. Pt denies sick contacts. Does not wear contacts or glasses.  Patient is a 29 y.o. female presenting with eye pain. The history is provided by the patient. No language interpreter was used.  Eye Pain    History reviewed. No pertinent past medical history. History reviewed. No pertinent past surgical history. History reviewed. No pertinent family history. History  Substance Use Topics  . Smoking status: Current Every Day Smoker  . Smokeless tobacco: Not on file  . Alcohol Use: Yes     Comment: occasionally   OB History    No data available     Review of Systems  Constitutional: Negative for fever and chills.  HENT: Negative for congestion and sore throat.   Eyes: Positive for pain, discharge, redness, itching and visual disturbance.     Allergies  Review of patient's allergies indicates no known allergies.  Home Medications   Prior to Admission medications   Medication Sig Start Date End Date Taking? Authorizing Provider  gabapentin (NEURONTIN) 300 MG capsule Take 300 mg by mouth 3 (three) times daily.   Yes Historical Provider, MD  ibuprofen (ADVIL,MOTRIN) 200 MG tablet Take 800 mg by mouth every 6 (six) hours as needed for moderate pain.   Yes Historical Provider, MD  Multiple  Vitamin (MULTIVITAMIN WITH MINERALS) TABS tablet Take 1 tablet by mouth daily.   Yes Historical Provider, MD  naproxen (NAPROSYN) 500 MG tablet Take 1 tablet (500 mg total) by mouth 2 (two) times daily. Patient not taking: Reported on 02/13/2014 10/14/13   Norman Herrlich, NP  traMADol (ULTRAM) 50 MG tablet Take 1 tablet (50 mg total) by mouth every 6 (six) hours as needed. Patient not taking: Reported on 02/13/2014 10/14/13   Norman Herrlich, NP   BP 144/102 mmHg  Pulse 85  Temp(Src) 98.2 F (36.8 C) (Oral)  Resp 16  SpO2 99% Physical Exam  Constitutional: She is oriented to person, place, and time. She appears well-developed and well-nourished. No distress.  HENT:  Head: Normocephalic and atraumatic.  Eyes: Pupils are equal, round, and reactive to light. Left eye exhibits no chemosis and no hordeolum. No foreign body present in the left eye. Left conjunctiva is injected. Left conjunctiva has no hemorrhage. Left eye exhibits normal extraocular motion and no nystagmus.  Slit lamp exam:      The left eye shows fluorescein uptake. The left eye shows no anterior chamber bulge.  Neck: Neck supple.  Pulmonary/Chest: Effort normal.  Musculoskeletal: Normal range of motion.  Neurological: She is alert and oriented to person, place, and time.  Skin: Skin is warm and dry. She is not diaphoretic.  Psychiatric: She has a normal mood and affect. Her behavior is normal.  Nursing note and vitals reviewed.  ED Course  Procedures  DIAGNOSTIC STUDIES: Oxygen Saturation is 99% on RA, noraml by my interpretation.     Visual Acuity MH  Visual Acuity - Bilateral Distance: 20/25 ; R Distance: 20/25 ; L Distance: 20/25    COORDINATION OF CARE: 1:11 PM Discussed treatment plan with pt at bedside and pt agreed to plan.  Labs Review Labs Reviewed - No data to display  Imaging Review No results found.   EKG Interpretation None      MDM   Final diagnoses:  Corneal abrasion, left, initial  encounter  Elevated blood pressure reading   Pt with corneal abrasion, no obvious FB.  I personally performed the services described in this documentation, which was scribed in my presence. The recorded information has been reviewed and is accurate.    Harvie Heck, PA-C 02/13/14 1354  Veryl Speak, MD 02/14/14 [phone]

## 2014-06-28 ENCOUNTER — Emergency Department (HOSPITAL_COMMUNITY): Payer: BLUE CROSS/BLUE SHIELD

## 2014-06-28 ENCOUNTER — Encounter (HOSPITAL_COMMUNITY): Payer: Self-pay | Admitting: Nurse Practitioner

## 2014-06-28 ENCOUNTER — Emergency Department (HOSPITAL_COMMUNITY)
Admission: EM | Admit: 2014-06-28 | Discharge: 2014-06-28 | Disposition: A | Discharge status: Home / Self Care | Payer: BLUE CROSS/BLUE SHIELD | Attending: Emergency Medicine | Admitting: Emergency Medicine

## 2014-06-28 DIAGNOSIS — Z7982 Long term (current) use of aspirin: Secondary | ICD-10-CM | POA: Insufficient documentation

## 2014-06-28 DIAGNOSIS — R61 Generalized hyperhidrosis: Secondary | ICD-10-CM | POA: Insufficient documentation

## 2014-06-28 DIAGNOSIS — H9209 Otalgia, unspecified ear: Secondary | ICD-10-CM | POA: Insufficient documentation

## 2014-06-28 DIAGNOSIS — J069 Acute upper respiratory infection, unspecified: Secondary | ICD-10-CM | POA: Diagnosis not present

## 2014-06-28 DIAGNOSIS — R05 Cough: Secondary | ICD-10-CM

## 2014-06-28 DIAGNOSIS — J9801 Acute bronchospasm: Secondary | ICD-10-CM | POA: Insufficient documentation

## 2014-06-28 DIAGNOSIS — Z720 Tobacco use: Secondary | ICD-10-CM | POA: Insufficient documentation

## 2014-06-28 DIAGNOSIS — Z79899 Other long term (current) drug therapy: Secondary | ICD-10-CM | POA: Diagnosis not present

## 2014-06-28 DIAGNOSIS — R059 Cough, unspecified: Secondary | ICD-10-CM

## 2014-06-28 MED ORDER — AZITHROMYCIN 250 MG PO TABS: 250.0000 mg | ORAL_TABLET | Freq: Every day | ORAL | Status: DC

## 2014-06-28 MED ORDER — GUAIFENESIN-CODEINE 100-10 MG/5ML PO SOLN: 5.0000 mL | Freq: Three times a day (TID) | ORAL | Status: DC | PRN

## 2014-06-28 NOTE — ED Notes (Signed)
Pt presents with c/o cough that she says is dry, nasal congestion, nights sweats and burning senstaion with cough. States at her work place, upper respiratory infections have been common, she has tried over the counter remedies without getting relief.

## 2014-06-28 NOTE — Discharge Instructions (Signed)
Upper Respiratory Infection, Adult An upper respiratory infection (URI) is also sometimes known as the common cold. The upper respiratory tract includes the nose, sinuses, throat, trachea, and bronchi. Bronchi are the airways leading to the lungs. Most people improve within 1 week, but symptoms can last up to 2 weeks. A residual cough may last even longer.  CAUSES Many different viruses can infect the tissues lining the upper respiratory tract. The tissues become irritated and inflamed and often become very moist. Mucus production is also common. A cold is contagious. You can easily spread the virus to others by oral contact. This includes kissing, sharing a glass, coughing, or sneezing. Touching your mouth or nose and then touching a surface, which is then touched by another person, can also spread the virus. SYMPTOMS  Symptoms typically develop 1 to 3 days after you come in contact with a cold virus. Symptoms vary from person to person. They may include:  Runny nose.  Sneezing.  Nasal congestion.  Sinus irritation.  Sore throat.  Loss of voice (laryngitis).  Cough.  Fatigue.  Muscle aches.  Loss of appetite.  Headache.  Low-grade fever. DIAGNOSIS  You might diagnose your own cold based on familiar symptoms, since most people get a cold 2 to 3 times a year. Your caregiver can confirm this based on your exam. Most importantly, your caregiver can check that your symptoms are not due to another disease such as strep throat, sinusitis, pneumonia, asthma, or epiglottitis. Blood tests, throat tests, and X-rays are not necessary to diagnose a common cold, but they may sometimes be helpful in excluding other more serious diseases. Your caregiver will decide if any further tests are required. RISKS AND COMPLICATIONS  You may be at risk for a more severe case of the common cold if you smoke cigarettes, have chronic heart disease (such as heart failure) or lung disease (such as asthma), or if  you have a weakened immune system. The very young and very old are also at risk for more serious infections. Bacterial sinusitis, middle ear infections, and bacterial pneumonia can complicate the common cold. The common cold can worsen asthma and chronic obstructive pulmonary disease (COPD). Sometimes, these complications can require emergency medical care and may be life-threatening. PREVENTION  The best way to protect against getting a cold is to practice good hygiene. Avoid oral or hand contact with people with cold symptoms. Wash your hands often if contact occurs. There is no clear evidence that vitamin C, vitamin E, echinacea, or exercise reduces the chance of developing a cold. However, it is always recommended to get plenty of rest and practice good nutrition. TREATMENT  Treatment is directed at relieving symptoms. There is no cure. Antibiotics are not effective, because the infection is caused by a virus, not by bacteria. Treatment may include:  Increased fluid intake. Sports drinks offer valuable electrolytes, sugars, and fluids.  Breathing heated mist or steam (vaporizer or shower).  Eating chicken soup or other clear broths, and maintaining good nutrition.  Getting plenty of rest.  Using gargles or lozenges for comfort.  Controlling fevers with ibuprofen or acetaminophen as directed by your caregiver.  Increasing usage of your inhaler if you have asthma. Zinc gel and zinc lozenges, taken in the first 24 hours of the common cold, can shorten the duration and lessen the severity of symptoms. Pain medicines may help with fever, muscle aches, and throat pain. A variety of non-prescription medicines are available to treat congestion and runny nose. Your caregiver   can make recommendations and may suggest nasal or lung inhalers for other symptoms.  HOME CARE INSTRUCTIONS   Only take over-the-counter or prescription medicines for pain, discomfort, or fever as directed by your  caregiver.  Use a warm mist humidifier or inhale steam from a shower to increase air moisture. This may keep secretions moist and make it easier to breathe.  Drink enough water and fluids to keep your urine clear or pale yellow.  Rest as needed.  Return to work when your temperature has returned to normal or as your caregiver advises. You may need to stay home longer to avoid infecting others. You can also use a face mask and careful hand washing to prevent spread of the virus. SEEK MEDICAL CARE IF:   After the first few days, you feel you are getting worse rather than better.  You need your caregiver's advice about medicines to control symptoms.  You develop chills, worsening shortness of breath, or brown or red sputum. These may be signs of pneumonia.  You develop yellow or brown nasal discharge or pain in the face, especially when you bend forward. These may be signs of sinusitis.  You develop a fever, swollen neck glands, pain with swallowing, or white areas in the back of your throat. These may be signs of strep throat. SEEK IMMEDIATE MEDICAL CARE IF:   You have a fever.  You develop severe or persistent headache, ear pain, sinus pain, or chest pain.  You develop wheezing, a prolonged cough, cough up blood, or have a change in your usual mucus (if you have chronic lung disease).  You develop sore muscles or a stiff neck. Document Released: 06/25/2000 Document Revised: 03/24/2011 Document Reviewed: 04/06/2013 Exit[employer] Patient Information 2015 [employer]. This information is not intended to replace advice given to you by your health care provider. Make sure you discuss any questions you have with your health care provider.  

## 2014-06-28 NOTE — ED Provider Notes (Signed)
CSN: [id number]     Arrival date & time 06/28/14  1501 History   This chart was scribed for Delos Haring, PA-C working with Serita Grit, MD by Mercy Moore, ED Scribe. This patient was seen in room WTR5/WTR5 and the patient's care was started at 4:13 PM.   Chief Complaint  Patient presents with  . Cough   The history is provided by the patient. No language interpreter was used.   HPI Comments: Yolanda Flores is a 29 y.o. female who presents to the Emergency Department complaining of URI like symptoms including dry, burning cough, worse at night, night sweats, nasal congestion, sore throat, ear pain, post nasal drainage, and malaise ongoing for six days now. Patient reports treatment with Alka Seltzer Cold without complete relief. 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  The patient denies diaphoresis, fever, headache, weakness (general or focal), confusion, change of vision,  neck pain, dysphagia, aphagia, chest pain, shortness of breath,  back pain, abdominal pains, nausea, vomiting, diarrhea, lower extremity swelling, rash.     History reviewed. No pertinent past medical history. History reviewed. No pertinent past surgical history. History reviewed. No pertinent family history. History  Substance Use Topics  . Smoking status: Current Every Day Smoker -- 4.00 packs/day    Types: Cigarettes  . Smokeless tobacco: Not on file  . Alcohol Use: Yes     Comment: occasionally   OB History    No data available     Review of Systems  Constitutional: Positive for diaphoresis. Negative for fever.  HENT: Positive for congestion, ear pain, postnasal drip and sore throat.   Respiratory: Positive for cough.   Gastrointestinal: Negative for nausea and vomiting.    Allergies  Review of patient's allergies indicates no known allergies.  Home Medications   Prior to Admission medications   Medication  Sig Start Date End Date Taking? Authorizing Provider  aspirin-sod bicarb-citric acid (ALKA-SELTZER) 325 MG TBEF tablet Take 325 mg by mouth every 6 (six) hours as needed (cold).   Yes Historical Provider, MD  Chlorphen-Pseudoephed-APAP (THERAFLU FLU/COLD PO) Take 1 tablet by mouth every 4 (four) hours as needed (cold).   Yes Historical Provider, MD  Cyanocobalamin (VITAMIN B 12 PO) Take 1 tablet by mouth daily.   Yes Historical Provider, MD  gabapentin (NEURONTIN) 300 MG capsule Take 300 mg by mouth 3 (three) times daily.   Yes Historical Provider, MD  ibuprofen (ADVIL,MOTRIN) 200 MG tablet Take 800 mg by mouth every 6 (six) hours as needed for moderate pain.   Yes Historical Provider, MD  Multiple Vitamin (MULTIVITAMIN WITH MINERALS) TABS tablet Take 1 tablet by mouth daily.   Yes Historical Provider, MD  erythromycin ophthalmic ointment Place a 1/2 inch ribbon of ointment into the lower eyelid 4 times a day. Patient not taking: Reported on 06/28/2014 02/13/14   Harvie Heck, PA-C   Triage Vitals: BP 137/101 mmHg  Pulse 103  Temp(Src) 98.7 F (37.1 C) (Oral)  Resp 14  Ht 5\' 7"  (1.702 m)  Wt 148 lb (67.132 kg)  BMI 23.17 kg/m2  SpO2 99%  LMP 06/07/2014 Physical Exam  Constitutional: She is oriented to person, place, and time. She appears well-developed and well-nourished. No distress.  HENT:  Head: Normocephalic and atraumatic.  Eyes: EOM are normal.  Neck: Neck supple. No tracheal deviation present.  Cardiovascular: Normal rate, regular rhythm and normal heart sounds.   Pulmonary/Chest: Effort normal and  breath sounds normal. No respiratory distress. She has no wheezes. She has no rales.  + bronchospasm  Musculoskeletal: Normal range of motion.  Neurological: She is alert and oriented to person, place, and time.  Skin: Skin is warm and dry.  Psychiatric: She has a normal mood and affect. Her behavior is normal.  Nursing note and vitals reviewed.   ED Course  Procedures (including  critical care time)  COORDINATION OF CARE: 4:18 PM- Will prescribe antibiotics and cough medication to help patient rest at night. Discussed treatment plan with patient at bedside and patient agreed to plan.   Labs Review Labs Reviewed - No data to display  Imaging Review Dg Chest 2 View  06/28/2014   CLINICAL DATA:  One week history of cough and congestion  EXAM: CHEST  2 VIEW  COMPARISON:  November 25, 2007  FINDINGS: Lungs are clear. Heart size and pulmonary vascularity are normal. No adenopathy. No bone lesions.  IMPRESSION: No edema or consolidation.   Electronically Signed   By: Lowella Grip III M.D.   On: 06/28/2014 15:50     EKG Interpretation None      MDM   Final diagnoses:  Cough   She has been sick for over one week, endorses feeling overall poor. 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 Will prescribe her azithromycin and give her cough medication to take at night. Patient is to follow-up with her primary care doctor regarding her blood pressure.  Medications - No data to display  29 y.o.Yolanda Flores's evaluation in the Emergency Department is complete. It has been determined that no acute conditions requiring further emergency intervention are present at this time. The patient/guardian have been advised of the diagnosis and plan. We have discussed signs and symptoms that warrant return to the ED, such as changes or worsening in symptoms.  Vital signs are stable at discharge. Filed Vitals:   06/28/14 1517  BP: 137/101  Pulse: 103  Temp: 98.7 F (37.1 C)  Resp: 14    Patient/guardian has voiced understanding and agreed to follow-up with the PCP or specialist.   I personally performed the services described in this documentation, which was scribed in my presence. The recorded information has been reviewed and is accurate.    Delos Haring, PA-C 06/28/14 [place], MD 06/28/14 [id number]

## 2014-12-20 ENCOUNTER — Emergency Department (HOSPITAL_COMMUNITY)
Admission: EM | Admit: 2014-12-20 | Discharge: 2014-12-20 | Disposition: A | Discharge status: Home / Self Care | Payer: BLUE CROSS/BLUE SHIELD | Attending: Emergency Medicine | Admitting: Emergency Medicine

## 2014-12-20 ENCOUNTER — Encounter (HOSPITAL_COMMUNITY): Payer: Self-pay | Admitting: Emergency Medicine

## 2014-12-20 ENCOUNTER — Emergency Department (HOSPITAL_COMMUNITY): Payer: BLUE CROSS/BLUE SHIELD

## 2014-12-20 DIAGNOSIS — J069 Acute upper respiratory infection, unspecified: Secondary | ICD-10-CM | POA: Diagnosis not present

## 2014-12-20 DIAGNOSIS — R05 Cough: Secondary | ICD-10-CM | POA: Diagnosis present

## 2014-12-20 DIAGNOSIS — F17210 Nicotine dependence, cigarettes, uncomplicated: Secondary | ICD-10-CM | POA: Diagnosis not present

## 2014-12-20 DIAGNOSIS — Z79899 Other long term (current) drug therapy: Secondary | ICD-10-CM | POA: Diagnosis not present

## 2014-12-20 DIAGNOSIS — R059 Cough, unspecified: Secondary | ICD-10-CM

## 2014-12-20 MED ORDER — PROMETHAZINE-CODEINE 6.25-10 MG/5ML PO SYRP: 5.0000 mL | ORAL_SOLUTION | Freq: Four times a day (QID) | ORAL | Status: DC | PRN

## 2014-12-20 MED ORDER — PSEUDOEPHEDRINE HCL 60 MG PO TABS
60.0000 mg | ORAL_TABLET | Freq: Four times a day (QID) | ORAL | Status: DC | PRN
  Filled 2014-12-20: qty 1

## 2014-12-20 MED ORDER — PSEUDOEPHEDRINE HCL 60 MG PO TABS: 60.0000 mg | ORAL_TABLET | Freq: Four times a day (QID) | ORAL | Status: DC | PRN

## 2014-12-20 NOTE — ED Provider Notes (Signed)
CSN: [id number]     Arrival date & time 12/20/14  42 History   First MD Initiated Contact with Patient 12/20/14 2143     Chief Complaint  Patient presents with  . Cough  . URI     (Consider location/radiation/quality/duration/timing/severity/associated sxs/prior Treatment) HPI Comments: Patient with URI symptoms for 7-10 day with associated cough has taken several OTC medication 1 time each without relief  Cough worsening   Patient is a 29 y.o. female presenting with cough and URI. The history is provided by the patient.  Cough Cough characteristics:  Non-productive Severity:  Moderate Onset quality:  Gradual Duration:  10 days Timing:  Intermittent Progression:  Worsening Chronicity:  New Smoker: yes   Relieved by:  Nothing Worsened by:  Deep breathing Ineffective treatments: OTC  Associated symptoms: rhinorrhea and sinus congestion   Associated symptoms: no chills, no fever, no shortness of breath, no sore throat and no wheezing   URI Presenting symptoms: congestion, cough and rhinorrhea   Presenting symptoms: no fever and no sore throat   Associated symptoms: no wheezing     History reviewed. No pertinent past medical history. History reviewed. No pertinent past surgical history. History reviewed. No pertinent family history. Social History  Substance Use Topics  . Smoking status: Current Every Day Smoker -- 4.00 packs/day    Types: Cigarettes  . Smokeless tobacco: None  . Alcohol Use: Yes     Comment: occasionally   OB History    No data available     Review of Systems  Constitutional: Negative for fever and chills.  HENT: Positive for congestion and rhinorrhea. Negative for sore throat.   Respiratory: Positive for cough. Negative for shortness of breath and wheezing.   All other systems reviewed and are negative.     Allergies  Review of patient's allergies indicates no known allergies.  Home Medications   Prior to Admission medications    Medication Sig Start Date End Date Taking? Authorizing Provider  aspirin-sod bicarb-citric acid (ALKA-SELTZER) 325 MG TBEF tablet Take 325 mg by mouth every 6 (six) hours as needed (cold).   Yes Historical Provider, MD  Chlorphen-Pseudoephed-APAP (THERAFLU FLU/COLD PO) Take 1 tablet by mouth every 4 (four) hours as needed (cold).   Yes Historical Provider, MD  gabapentin (NEURONTIN) 300 MG capsule Take 300 mg by mouth 3 (three) times daily.   Yes Historical Provider, MD  ibuprofen (ADVIL,MOTRIN) 200 MG tablet Take 800 mg by mouth every 6 (six) hours as needed for moderate pain.   Yes Historical Provider, MD  promethazine-codeine (PHENERGAN WITH CODEINE) 6.25-10 MG/5ML syrup Take 5 mLs by mouth every 6 (six) hours as needed for cough. 12/20/14   Junius Creamer, NP  pseudoephedrine (SUDAFED) 60 MG tablet Take 1 tablet (60 mg total) by mouth every 6 (six) hours as needed for congestion. 12/20/14   Junius Creamer, NP   BP 144/88 mmHg  Pulse 77  Temp(Src) 99 F (37.2 C) (Oral)  Resp 21  SpO2 99%  LMP 12/11/2014 Physical Exam  Constitutional: She appears well-developed and well-nourished.  HENT:  Head: Normocephalic.  Mouth/Throat: Oropharynx is clear and moist.  Eyes: Pupils are equal, round, and reactive to light.  Neck: Normal range of motion.  Cardiovascular: Normal rate and regular rhythm.   When BP check when not coughing diastolic [id number]  Pulmonary/Chest: Effort normal and breath sounds normal. No respiratory distress. She has no wheezes. She exhibits no tenderness.  Abdominal: Soft.  Musculoskeletal: Normal range of motion. She exhibits no  edema or tenderness.  Lymphadenopathy:    She has no cervical adenopathy.  Neurological: She is alert.  Skin: Skin is warm.  Nursing note and vitals reviewed.   ED Course  Procedures (including critical care time) Labs Review Labs Reviewed - No data to display  Imaging Review Dg Chest 2 View  12/20/2014  CLINICAL DATA:  Cough, congestion and  chest and back pain for 5 days. EXAM: CHEST  2 VIEW COMPARISON:  06/28/2014. FINDINGS: The heart size and mediastinal contours are within normal limits. Both lungs are clear. The visualized skeletal structures are unremarkable. IMPRESSION: Normal chest x-ray. Electronically Signed   By: Marijo Sanes M.D.   On: 12/20/2014 18:25   I have personally reviewed and evaluated these images and lab results as part of my medical decision-making.   EKG Interpretation None     instucted to take the RX medications only as directed  MDM   Final diagnoses:  URI (upper respiratory infection)  Cough         Junius Creamer, NP 12/21/14 [id number]  Milton Ferguson, MD 12/21/14 1557

## 2014-12-20 NOTE — Discharge Instructions (Signed)

## 2014-12-20 NOTE — ED Notes (Signed)
Pt sts cough and pain with cough in chest and back x 4 days

## 2018-05-24 ENCOUNTER — Other Ambulatory Visit: Payer: Self-pay

## 2018-05-24 ENCOUNTER — Inpatient Hospital Stay (HOSPITAL_COMMUNITY)
Admission: EM | Admit: 2018-05-24 | Discharge: 2018-05-27 | DRG: 392 | Disposition: A | Discharge status: Home / Self Care | Payer: BLUE CROSS/BLUE SHIELD | Attending: Internal Medicine | Admitting: Internal Medicine

## 2018-05-24 ENCOUNTER — Emergency Department (HOSPITAL_COMMUNITY): Payer: BLUE CROSS/BLUE SHIELD

## 2018-05-24 ENCOUNTER — Encounter (HOSPITAL_COMMUNITY): Payer: Self-pay

## 2018-05-24 DIAGNOSIS — Z79899 Other long term (current) drug therapy: Secondary | ICD-10-CM

## 2018-05-24 DIAGNOSIS — R079 Chest pain, unspecified: Secondary | ICD-10-CM | POA: Diagnosis present

## 2018-05-24 DIAGNOSIS — R9431 Abnormal electrocardiogram [ECG] [EKG]: Secondary | ICD-10-CM | POA: Insufficient documentation

## 2018-05-24 DIAGNOSIS — E785 Hyperlipidemia, unspecified: Secondary | ICD-10-CM | POA: Diagnosis present

## 2018-05-24 DIAGNOSIS — Z791 Long term (current) use of non-steroidal anti-inflammatories (NSAID): Secondary | ICD-10-CM

## 2018-05-24 DIAGNOSIS — Z8249 Family history of ischemic heart disease and other diseases of the circulatory system: Secondary | ICD-10-CM

## 2018-05-24 DIAGNOSIS — E872 Acidosis, unspecified: Secondary | ICD-10-CM | POA: Diagnosis present

## 2018-05-24 DIAGNOSIS — K219 Gastro-esophageal reflux disease without esophagitis: Secondary | ICD-10-CM | POA: Diagnosis present

## 2018-05-24 DIAGNOSIS — K2920 Alcoholic gastritis without bleeding: Principal | ICD-10-CM | POA: Diagnosis present

## 2018-05-24 DIAGNOSIS — F1290 Cannabis use, unspecified, uncomplicated: Secondary | ICD-10-CM | POA: Diagnosis present

## 2018-05-24 DIAGNOSIS — Y903 Blood alcohol level of 60-79 mg/100 ml: Secondary | ICD-10-CM | POA: Diagnosis present

## 2018-05-24 DIAGNOSIS — I152 Hypertension secondary to endocrine disorders: Secondary | ICD-10-CM | POA: Diagnosis present

## 2018-05-24 DIAGNOSIS — R7989 Other specified abnormal findings of blood chemistry: Secondary | ICD-10-CM | POA: Diagnosis present

## 2018-05-24 DIAGNOSIS — E876 Hypokalemia: Secondary | ICD-10-CM | POA: Diagnosis present

## 2018-05-24 DIAGNOSIS — Z720 Tobacco use: Secondary | ICD-10-CM | POA: Diagnosis present

## 2018-05-24 DIAGNOSIS — E1159 Type 2 diabetes mellitus with other circulatory complications: Secondary | ICD-10-CM | POA: Diagnosis present

## 2018-05-24 DIAGNOSIS — R945 Abnormal results of liver function studies: Secondary | ICD-10-CM

## 2018-05-24 DIAGNOSIS — R0789 Other chest pain: Secondary | ICD-10-CM | POA: Diagnosis not present

## 2018-05-24 DIAGNOSIS — Z7982 Long term (current) use of aspirin: Secondary | ICD-10-CM

## 2018-05-24 DIAGNOSIS — I10 Essential (primary) hypertension: Secondary | ICD-10-CM | POA: Diagnosis present

## 2018-05-24 DIAGNOSIS — Z20828 Contact with and (suspected) exposure to other viral communicable diseases: Secondary | ICD-10-CM | POA: Diagnosis present

## 2018-05-24 DIAGNOSIS — F1011 Alcohol abuse, in remission: Secondary | ICD-10-CM | POA: Diagnosis present

## 2018-05-24 DIAGNOSIS — F17210 Nicotine dependence, cigarettes, uncomplicated: Secondary | ICD-10-CM | POA: Diagnosis present

## 2018-05-24 DIAGNOSIS — F1010 Alcohol abuse, uncomplicated: Secondary | ICD-10-CM | POA: Diagnosis not present

## 2018-05-24 HISTORY — DX: Essential (primary) hypertension: I10

## 2018-05-24 LAB — HEPATIC FUNCTION PANEL
ALT: 92 U/L — ABNORMAL HIGH (ref 0–44)
AST: 329 U/L — ABNORMAL HIGH (ref 15–41)
Albumin: 5.2 g/dL — ABNORMAL HIGH (ref 3.5–5.0)
Alkaline Phosphatase: 101 U/L (ref 38–126)
Bilirubin, Direct: 0.3 mg/dL — ABNORMAL HIGH (ref 0.0–0.2)
Indirect Bilirubin: 1.4 mg/dL — ABNORMAL HIGH (ref 0.3–0.9)
Total Bilirubin: 1.7 mg/dL — ABNORMAL HIGH (ref 0.3–1.2)
Total Protein: 8.7 g/dL — ABNORMAL HIGH (ref 6.5–8.1)

## 2018-05-24 LAB — CBC WITH DIFFERENTIAL/PLATELET
Abs Immature Granulocytes: 0.02 10*3/uL (ref 0.00–0.07)
Basophils Absolute: 0.1 10*3/uL (ref 0.0–0.1)
Basophils Relative: 1 %
Eosinophils Absolute: 0 10*3/uL (ref 0.0–0.5)
Eosinophils Relative: 1 %
HCT: 34.7 % — ABNORMAL LOW (ref 36.0–46.0)
Hemoglobin: 12.4 g/dL (ref 12.0–15.0)
Immature Granulocytes: 1 %
Lymphocytes Relative: 16 %
Lymphs Abs: 0.7 10*3/uL (ref 0.7–4.0)
MCH: 35.9 pg — ABNORMAL HIGH (ref 26.0–34.0)
MCHC: 35.7 g/dL (ref 30.0–36.0)
MCV: 100.6 fL — ABNORMAL HIGH (ref 80.0–100.0)
Monocytes Absolute: 0.3 10*3/uL (ref 0.1–1.0)
Monocytes Relative: 7 %
Neutro Abs: 3.2 10*3/uL (ref 1.7–7.7)
Neutrophils Relative %: 74 %
Platelets: 229 10*3/uL (ref 150–400)
RBC: 3.45 MIL/uL — ABNORMAL LOW (ref 3.87–5.11)
RDW: 14.6 % (ref 11.5–15.5)
WBC: 4.3 10*3/uL (ref 4.0–10.5)
nRBC: 0 % (ref 0.0–0.2)

## 2018-05-24 LAB — D-DIMER, QUANTITATIVE: D-Dimer, Quant: 2.12 ug/mL-FEU — ABNORMAL HIGH (ref 0.00–0.50)

## 2018-05-24 LAB — BASIC METABOLIC PANEL
Anion gap: 26 — ABNORMAL HIGH (ref 5–15)
BUN: 5 mg/dL — ABNORMAL LOW (ref 6–20)
CO2: 23 mmol/L (ref 22–32)
Calcium: 9.1 mg/dL (ref 8.9–10.3)
Chloride: 92 mmol/L — ABNORMAL LOW (ref 98–111)
Creatinine, Ser: 0.83 mg/dL (ref 0.44–1.00)
GFR calc Af Amer: >60 mL/min (ref >60)
GFR calc non Af Amer: >60 mL/min (ref >60)
Glucose, Bld: 139 mg/dL — ABNORMAL HIGH (ref 70–99)
Potassium: 3 mmol/L — ABNORMAL LOW (ref 3.5–5.1)
Sodium: 141 mmol/L (ref 135–145)

## 2018-05-24 LAB — I-STAT BETA HCG BLOOD, ED (MC, WL, AP ONLY): I-stat hCG, quantitative: <5 m[IU]/mL (ref <5)

## 2018-05-24 LAB — ETHANOL: Alcohol, Ethyl (B): 60 mg/dL — ABNORMAL HIGH (ref <10)

## 2018-05-24 LAB — CBC
HCT: 34.1 % — ABNORMAL LOW (ref 36.0–46.0)
Hemoglobin: 12.3 g/dL (ref 12.0–15.0)
MCH: 36.2 pg — ABNORMAL HIGH (ref 26.0–34.0)
MCHC: 36.1 g/dL — ABNORMAL HIGH (ref 30.0–36.0)
MCV: 100.3 fL — ABNORMAL HIGH (ref 80.0–100.0)
Platelets: 225 10*3/uL (ref 150–400)
RBC: 3.4 MIL/uL — ABNORMAL LOW (ref 3.87–5.11)
RDW: 14.4 % (ref 11.5–15.5)
WBC: 4.3 10*3/uL (ref 4.0–10.5)
nRBC: 0.5 % — ABNORMAL HIGH (ref 0.0–0.2)

## 2018-05-24 LAB — ACETAMINOPHEN LEVEL: Acetaminophen (Tylenol), Serum: <10 ug/mL — ABNORMAL LOW (ref 10–30)

## 2018-05-24 LAB — LACTIC ACID, PLASMA
Lactic Acid, Venous: 5.4 mmol/L (ref 0.5–1.9)
Lactic Acid, Venous: 2.8 mmol/L (ref 0.5–1.9)

## 2018-05-24 LAB — PROTIME-INR
INR: 0.9 (ref 0.8–1.2)
Prothrombin Time: 12.5 seconds (ref 11.4–15.2)

## 2018-05-24 LAB — LIPASE, BLOOD: Lipase: 73 U/L — ABNORMAL HIGH (ref 11–51)

## 2018-05-24 LAB — TROPONIN I
Troponin I: <0.03 ng/mL (ref <0.03)
Troponin I: <0.03 ng/mL (ref <0.03)

## 2018-05-24 LAB — MAGNESIUM: Magnesium: 1.8 mg/dL (ref 1.7–2.4)

## 2018-05-24 LAB — SALICYLATE LEVEL: Salicylate Lvl: <7 mg/dL (ref 2.8–30.0)

## 2018-05-24 LAB — SARS CORONAVIRUS 2 BY RT PCR (HOSPITAL ORDER, PERFORMED IN ~~LOC~~ HOSPITAL LAB): SARS Coronavirus 2: NEGATIVE

## 2018-05-24 MED ORDER — IOHEXOL 350 MG/ML SOLN
75.0000 mL | Freq: Once | INTRAVENOUS | Status: AC | PRN
  Administered 2018-05-24: 75 mL via INTRAVENOUS

## 2018-05-24 MED ORDER — NICOTINE 21 MG/24HR TD PT24
21.0000 mg | MEDICATED_PATCH | Freq: Every day | TRANSDERMAL | Status: DC
  Administered 2018-05-24 – 2018-05-27 (×4): 21 mg via TRANSDERMAL
  Filled 2018-05-24 (×4): qty 1

## 2018-05-24 MED ORDER — SODIUM CHLORIDE 0.9 % IV BOLUS
1000.0000 mL | Freq: Once | INTRAVENOUS | Status: AC
  Administered 2018-05-24: 20:00:00 1000 mL via INTRAVENOUS

## 2018-05-24 MED ORDER — ENOXAPARIN SODIUM 40 MG/0.4ML ~~LOC~~ SOLN: 40.0000 mg | SUBCUTANEOUS | Status: DC

## 2018-05-24 MED ORDER — LISINOPRIL 10 MG PO TABS
10.0000 mg | ORAL_TABLET | Freq: Every day | ORAL | Status: DC
  Administered 2018-05-25 – 2018-05-26 (×2): 10 mg via ORAL
  Filled 2018-05-24 (×2): qty 1

## 2018-05-24 MED ORDER — CALCIUM CARBONATE ANTACID 500 MG PO CHEW
1.0000 | CHEWABLE_TABLET | Freq: Once | ORAL | Status: AC
  Administered 2018-05-24: 200 mg via ORAL
  Filled 2018-05-24: qty 1

## 2018-05-24 MED ORDER — SODIUM CHLORIDE 0.9 % IV BOLUS
1000.0000 mL | Freq: Once | INTRAVENOUS | Status: AC
  Administered 2018-05-24: 19:00:00 1000 mL via INTRAVENOUS

## 2018-05-24 MED ORDER — VITAMIN B-1 100 MG PO TABS
100.0000 mg | ORAL_TABLET | Freq: Every day | ORAL | Status: DC
  Administered 2018-05-25 – 2018-05-27 (×3): 100 mg via ORAL
  Filled 2018-05-24 (×3): qty 1

## 2018-05-24 MED ORDER — PANTOPRAZOLE SODIUM 40 MG PO TBEC
40.0000 mg | DELAYED_RELEASE_TABLET | Freq: Every day | ORAL | Status: DC
  Administered 2018-05-25 – 2018-05-27 (×3): 40 mg via ORAL
  Filled 2018-05-24 (×3): qty 1

## 2018-05-24 MED ORDER — THIAMINE HCL 100 MG/ML IJ SOLN
100.0000 mg | Freq: Every day | INTRAMUSCULAR | Status: DC
  Administered 2018-05-24: 100 mg via INTRAVENOUS
  Filled 2018-05-24 (×3): qty 2

## 2018-05-24 MED ORDER — SODIUM CHLORIDE 0.9 % IV SOLN
INTRAVENOUS | Status: DC
  Administered 2018-05-24 – 2018-05-25 (×2): via INTRAVENOUS

## 2018-05-24 MED ORDER — MORPHINE SULFATE (PF) 2 MG/ML IV SOLN: 2.0000 mg | INTRAVENOUS | Status: DC | PRN

## 2018-05-24 MED ORDER — SODIUM CHLORIDE 0.9% FLUSH
3.0000 mL | Freq: Once | INTRAVENOUS | Status: AC
  Administered 2018-05-26: 3 mL via INTRAVENOUS

## 2018-05-24 MED ORDER — LORAZEPAM 2 MG/ML IJ SOLN
0.0000 mg | Freq: Four times a day (QID) | INTRAMUSCULAR | Status: AC
  Administered 2018-05-24: 2 mg via INTRAVENOUS
  Filled 2018-05-24: qty 1

## 2018-05-24 MED ORDER — NITROGLYCERIN 0.4 MG SL SUBL: 0.4000 mg | SUBLINGUAL_TABLET | SUBLINGUAL | Status: DC | PRN

## 2018-05-24 MED ORDER — POTASSIUM CHLORIDE CRYS ER 20 MEQ PO TBCR
40.0000 meq | EXTENDED_RELEASE_TABLET | Freq: Once | ORAL | Status: AC
  Administered 2018-05-24: 40 meq via ORAL
  Filled 2018-05-24: qty 2

## 2018-05-24 MED ORDER — LORAZEPAM 2 MG/ML IJ SOLN: 0.0000 mg | Freq: Two times a day (BID) | INTRAMUSCULAR | Status: DC

## 2018-05-24 MED ORDER — LORAZEPAM 1 MG PO TABS: 0.0000 mg | ORAL_TABLET | Freq: Four times a day (QID) | ORAL | Status: AC

## 2018-05-24 MED ORDER — ALUM & MAG HYDROXIDE-SIMETH 200-200-20 MG/5ML PO SUSP
30.0000 mL | Freq: Four times a day (QID) | ORAL | Status: DC | PRN
  Administered 2018-05-26 – 2018-05-27 (×2): 30 mL via ORAL
  Filled 2018-05-24 (×2): qty 30

## 2018-05-24 MED ORDER — HYDRALAZINE HCL 20 MG/ML IJ SOLN
5.0000 mg | INTRAMUSCULAR | Status: DC | PRN
  Filled 2018-05-24: qty 1

## 2018-05-24 MED ORDER — MAGNESIUM SULFATE 2 GM/50ML IV SOLN
2.0000 g | Freq: Once | INTRAVENOUS | Status: AC
  Administered 2018-05-24: 2 g via INTRAVENOUS
  Filled 2018-05-24: qty 50

## 2018-05-24 MED ORDER — ACETAMINOPHEN 325 MG PO TABS: 650.0000 mg | ORAL_TABLET | ORAL | Status: DC | PRN

## 2018-05-24 MED ORDER — AMLODIPINE BESYLATE 5 MG PO TABS: 5.0000 mg | ORAL_TABLET | Freq: Every day | ORAL | Status: DC

## 2018-05-24 MED ORDER — SUCRALFATE 1 GM/10ML PO SUSP
1.0000 g | Freq: Three times a day (TID) | ORAL | Status: DC
  Administered 2018-05-24 – 2018-05-27 (×11): 1 g via ORAL
  Filled 2018-05-24 (×13): qty 10

## 2018-05-24 MED ORDER — LORAZEPAM 1 MG PO TABS: 0.0000 mg | ORAL_TABLET | Freq: Two times a day (BID) | ORAL | Status: DC

## 2018-05-24 NOTE — ED Notes (Signed)
Holding bolus per EDP until after CT scan

## 2018-05-24 NOTE — ED Notes (Signed)
ED TO INPATIENT HANDOFF REPORT  ED Nurse Name and Phone #: [phone]  S Name/Age/Gender Yolanda Flores 33 y.o. female Room/Bed: 047C/047C  Code Status   Code Status: Full Code  Home/SNF/Other Home Patient oriented to: self, place, time and situation Is this baseline? Yes   Triage Complete: Triage complete  Chief Complaint Chest Pressure  Triage Note Pt reports chest pressure that's been ongoing for a month but has gotten worse. Seen at [facility] today but states the medicine they gave her today did not help (GI cocktail). Pt a.o, nad noted.   Allergies No Known Allergies  Level of Care/Admitting Diagnosis ED Disposition    ED Disposition Condition Comment   Admit  Hospital Area: [facility] [[id number]]  Level of Care: Telemetry Cardiac [103]  I expect the patient will be discharged within 24 hours: No (not a candidate for 5C-Observation unit)  Covid Evaluation: N/A  Diagnosis: Chest pain [542706]  Admitting Physician: Ivor Costa [[id number]]  Attending Physician: Ivor Costa [[id number]]  PT Class (Do Not Modify): Observation [104]  PT Acc Code (Do Not Modify): Observation [10022]       B Medical/Surgery History Past Medical History:  Diagnosis Date  . Hypertension    History reviewed. No pertinent surgical history.   A IV Location/Drains/Wounds Patient Lines/Drains/Airways Status   Active Line/Drains/Airways    Name:   Placement date:   Placement time:   Site:   Days:   Peripheral IV 05/24/18 Left Antecubital   05/24/18    1723    Antecubital   less than 1          Intake/Output Last 24 hours No intake or output data in the 24 hours ending 05/24/18 2119  Labs/Imaging Results for orders placed or performed during the hospital encounter of 05/24/18 (from the past 48 hour(s))  Basic metabolic panel     Status: Abnormal   Collection Time: 05/24/18  1:33 PM  Result Value Ref Range   Sodium 141 135 - 145 mmol/L   Potassium 3.0 (L) 3.5 - 5.1 mmol/L    Chloride 92 (L) 98 - 111 mmol/L   CO2 23 22 - 32 mmol/L   Glucose, Bld 139 (H) 70 - 99 mg/dL   BUN 5 (L) 6 - 20 mg/dL   Creatinine, Ser 0.83 0.44 - 1.00 mg/dL   Calcium 9.1 8.9 - 10.3 mg/dL   GFR calc non Af Amer >60 >60 mL/min   GFR calc Af Amer >60 >60 mL/min   Anion gap 26 (H) 5 - 15    Comment: Performed at [facility], [address], [place], [place] [address]  CBC     Status: Abnormal   Collection Time: 05/24/18  1:33 PM  Result Value Ref Range   WBC 4.3 4.0 - 10.5 K/uL   RBC 3.40 (L) 3.87 - 5.11 MIL/uL   Hemoglobin 12.3 12.0 - 15.0 g/dL   HCT 34.1 (L) 36.0 - 46.0 %   MCV 100.3 (H) 80.0 - 100.0 fL   MCH 36.2 (H) 26.0 - 34.0 pg   MCHC 36.1 (H) 30.0 - 36.0 g/dL   RDW 14.4 11.5 - 15.5 %   Platelets 225 150 - 400 K/uL   nRBC 0.5 (H) 0.0 - 0.2 %    Comment: Performed at [facility] [address], [place], [place] [address]  Troponin I - ONCE - STAT     Status: None   Collection Time: 05/24/18  1:33 PM  Result Value Ref Range   Troponin I <0.03 <0.03 ng/mL    Comment: Performed at [facility], [place] [address], [place], [place] [address]  CBC with Differential/Platelet     Status: Abnormal   Collection Time: 05/24/18  1:33 PM  Result Value Ref Range   WBC 4.3 4.0 - 10.5 K/uL   RBC 3.45 (L) 3.87 - 5.11 MIL/uL   Hemoglobin 12.4 12.0 - 15.0 g/dL   HCT 34.7 (L) 36.0 - 46.0 %   MCV 100.6 (H) 80.0 - 100.0 fL   MCH 35.9 (H) 26.0 - 34.0 pg   MCHC 35.7 30.0 - 36.0 g/dL   RDW 14.6 11.5 - 15.5 %   Platelets 229 150 - 400 K/uL   nRBC 0.0 0.0 - 0.2 %   Neutrophils Relative % 74 %   Neutro Abs 3.2 1.7 - 7.7 K/uL   Lymphocytes Relative 16 %   Lymphs Abs 0.7 0.7 - 4.0 K/uL   Monocytes Relative 7 %   Monocytes Absolute 0.3 0.1 - 1.0 K/uL   Eosinophils Relative 1 %   Eosinophils Absolute 0.0 0.0 - 0.5 K/uL   Basophils Relative 1 %   Basophils Absolute 0.1 0.0 - 0.1 K/uL   Immature Granulocytes 1 %   Abs Immature Granulocytes 0.02 0.00 - 0.07 K/uL    Comment:  Performed at [facility], [address] [place], [place] [address]  I-Stat beta hCG blood, ED     Status: None   Collection Time: 05/24/18  2:23 PM  Result Value Ref Range   I-stat hCG, quantitative <5.0 <5 mIU/mL   Comment 3            Comment:   GEST. AGE      CONC.  (mIU/mL)   <=1 WEEK        5 - 50     2 WEEKS       50 - 500     3 WEEKS       100 - 10,000     4 WEEKS     1,000 - 30,000        FEMALE AND NON-PREGNANT FEMALE:     LESS THAN 5 mIU/mL   Ethanol     Status: Abnormal   Collection Time: 05/24/18  4:55 PM  Result Value Ref Range   Alcohol, Ethyl (B) 60 (H) <10 mg/dL    Comment: (NOTE) Lowest detectable limit for serum alcohol is 10 mg/dL. For medical purposes only. Performed at [facility], [place] [address] [place], [place] [address]   Acetaminophen level     Status: Abnormal   Collection Time: 05/24/18  4:55 PM  Result Value Ref Range   Acetaminophen (Tylenol), Serum <10 (L) 10 - 30 ug/mL    Comment: (NOTE) Therapeutic concentrations vary significantly. A range of 10-30 ug/mL  may be an effective concentration for many patients. However, some  are best treated at concentrations outside of this range. Acetaminophen concentrations >150 ug/mL at 4 hours after ingestion  and >50 ug/mL at 12 hours after ingestion are often associated with  toxic reactions. Performed at [facility], [place] [address] [place], [place] [address]   Salicylate level     Status: None   Collection Time: 05/24/18  4:55 PM  Result Value Ref Range   Salicylate Lvl <0.3 2.8 - 30.0 mg/dL    Comment: Performed at [facility] [address], [place], [place] [address]  D-dimer, quantitative     Status: Abnormal   Collection Time: 05/24/18  4:55 PM  Result Value Ref Range   D-Dimer, Quant 2.12 (H) 0.00 - 0.50 ug/mL-FEU    Comment: (NOTE) At the manufacturer cut-off of 0.50 ug/mL FEU, this assay has been documented to exclude PE with a sensitivity and negative  predictive value of 97 to 99%.  At this time, this assay has not been approved by the FDA to exclude DVT/VTE. Results should be correlated with clinical presentation. Performed at [facility], [place] [address] [place], [place] [address]   Hepatic function panel     Status: Abnormal   Collection Time: 05/24/18  4:55 PM  Result Value Ref Range   Total Protein 8.7 (H) 6.5 - 8.1 g/dL   Albumin 5.2 (H) 3.5 - 5.0 g/dL   AST 329 (H) 15 - 41 U/L   ALT 92 (H) 0 - 44 U/L   Alkaline Phosphatase 101 38 - 126 U/L   Total Bilirubin 1.7 (H) 0.3 - 1.2 mg/dL   Bilirubin, Direct 0.3 (H) 0.0 - 0.2 mg/dL   Indirect Bilirubin 1.4 (H) 0.3 - 0.9 mg/dL    Comment: Performed at [facility] [address] [place], [place] [address]  Lipase, blood     Status: Abnormal   Collection Time: 05/24/18  4:55 PM  Result Value Ref Range   Lipase 73 (H) 11 - 51 U/L    Comment: Performed at [facility], [place] [address], [place], [place] [address]  Magnesium     Status: None   Collection Time: 05/24/18  4:59 PM  Result Value Ref Range   Magnesium 1.8 1.7 - 2.4 mg/dL    Comment: Performed at [facility] [address], [place], [place] [address]  SARS Coronavirus 2 (CEPHEID- Performed in [facility] hospital lab), Hosp Order     Status: None   Collection Time: 05/24/18  5:08 PM  Result Value Ref Range   SARS Coronavirus 2 NEGATIVE NEGATIVE    Comment: (NOTE) If result is NEGATIVE SARS-CoV-2 target nucleic acids are NOT DETECTED. The SARS-CoV-2 RNA is generally detectable in upper and lower  respiratory specimens during the acute phase of infection. The lowest  concentration of SARS-CoV-2 viral copies this assay can detect is 250  copies / mL. A negative result does not preclude SARS-CoV-2 infection  and should not be used as the sole basis for treatment or other  patient management decisions.  A negative result may occur with  improper specimen collection / handling, submission of  specimen other  than nasopharyngeal swab, presence of viral mutation(s) within the  areas targeted by this assay, and inadequate number of viral copies  (<250 copies / mL). A negative result must be combined with clinical  observations, patient history, and epidemiological information. If result is POSITIVE SARS-CoV-2 target nucleic acids are DETECTED. The SARS-CoV-2 RNA is generally detectable in upper and lower  respiratory specimens dur ing the acute phase of infection.  Positive  results are indicative of active infection with SARS-CoV-2.  Clinical  correlation with patient history and other diagnostic information is  necessary to determine patient infection status.  Positive results do  not rule out bacterial infection or co-infection with other viruses. If result is PRESUMPTIVE POSTIVE SARS-CoV-2 nucleic acids MAY BE PRESENT.   A presumptive positive result was obtained on the submitted specimen  and confirmed on repeat testing.  While 2019 novel coronavirus  (SARS-CoV-2)  nucleic acids may be present in the submitted sample  additional confirmatory testing may be necessary for epidemiological  and / or clinical management purposes  to differentiate between  SARS-CoV-2 and other Sarbecovirus currently known to infect humans.  If clinically indicated additional testing with an alternate test  methodology [phone]) is advised. The SARS-CoV-2 RNA is generally  detectable in upper and lower respiratory sp ecimens during the acute  phase of infection. The expected result is Negative. Fact Sheet for Patients:  StrictlyIdeas.no Fact Sheet for Healthcare Providers: BankingDealers.co.za This test is not yet approved or cleared by the [place] FDA and has been authorized for detection and/or diagnosis of SARS-CoV-2 by FDA under an Emergency Use Authorization (EUA).  This EUA will remain in effect (meaning this test can be used) for the  duration of the COVID-19 declaration under Section 564(b)(1) of the Act, 21 U.S.C. section 360bbb-3(b)(1), unless the authorization is terminated or revoked sooner. Performed at [facility], [place] [address] [place], [place] [address]   Lactic acid, plasma     Status: Abnormal   Collection Time: 05/24/18  5:10 PM  Result Value Ref Range   Lactic Acid, Venous 5.4 (HH) 0.5 - 1.9 mmol/L    Comment: CRITICAL RESULT CALLED TO, READ BACK BY AND VERIFIED WITH: B.Day Greb RN 1800 05/24/2018 MCCORMICK K Performed at [facility], [place] [address], [place], [place] [address]   Protime-INR     Status: None   Collection Time: 05/24/18  6:09 PM  Result Value Ref Range   Prothrombin Time 12.5 11.4 - 15.2 seconds   INR 0.9 0.8 - 1.2    Comment: (NOTE) INR goal varies based on device and disease states. Performed at [facility], [place] [address] [place], [place] [address]   Troponin I - Now Then Q6H     Status: None   Collection Time: 05/24/18  8:10 PM  Result Value Ref Range   Troponin I <0.03 <0.03 ng/mL    Comment: Performed at [facility] [address] [place], [place] [address]  Lactic acid, plasma     Status: Abnormal   Collection Time: 05/24/18  8:15 PM  Result Value Ref Range   Lactic Acid, Venous 2.8 (HH) 0.5 - 1.9 mmol/L    Comment: CRITICAL RESULT CALLED TO, READ BACK BY AND VERIFIED WITH: Naomy Esham B,RN 05/24/18 2053 WAYK Performed at [facility], [place] [address] [place], [place] [address]    Dg Chest 2 View  Result Date: 05/24/2018 CLINICAL DATA:  33 year old female with a history of chest pain EXAM: CHEST - 2 VIEW COMPARISON:  Multiple prior most recent 12/20/2014 FINDINGS: Cardiomediastinal silhouette within normal limits in size and contour. No pneumothorax or pleural effusion. No confluent airspace disease. No displaced fracture. IMPRESSION: Negative for acute cardiopulmonary disease Electronically Signed   By: Corrie Mckusick D.O.   On: 05/24/2018  14:24   Ct Angio Chest Pe W And/or Wo Contrast  Result Date: 05/24/2018 CLINICAL DATA:  Evaluate for pulmonary embolus. Positive D-dimer. Chest pain. EXAM: CT ANGIOGRAPHY CHEST WITH CONTRAST TECHNIQUE: Multidetector CT imaging of the chest was performed using the standard protocol during bolus administration of intravenous contrast. Multiplanar CT image reconstructions and MIPs were obtained to evaluate the vascular anatomy. CONTRAST:  70mL OMNIPAQUE IOHEXOL 350 MG/ML SOLN COMPARISON:  None. FINDINGS: Cardiovascular: The heart size is normal. Main pulmonary artery appears patent. No saddle embolus or central obstructing pulmonary emboli identified. No lobar or  segmental pulmonary artery filling defects identified. Mediastinum/Nodes: No enlarged mediastinal, hilar, or axillary lymph nodes. Thyroid gland, trachea, and esophagus demonstrate no significant findings. Lungs/Pleura: No pleural effusion. No airspace consolidation or atelectasis. Upper Abdomen: Hepatic steatosis.  No acute abnormality. Musculoskeletal: No chest wall abnormality. No acute or significant osseous findings. Review of the MIP images confirms the above findings. IMPRESSION: 1. No evidence for acute pulmonary embolus. No acute cardiopulmonary abnormalities. 2. Suspect hepatic steatosis. 1. Electronically Signed   By: Kerby Moors M.D.   On: 05/24/2018 19:31    Pending Labs Unresulted Labs (From admission, onward)    Start     Ordered   05/25/18 0500  Hemoglobin A1c  Tomorrow morning,   R     05/24/18 2003   05/25/18 0500  Lipid panel  Tomorrow morning,   R    Comments:  Please obtain as a fasting lipid panel - should not have eaten/ drank food for 8 hours prior to labs.    05/24/18 2003   05/24/18 2014  Hepatitis panel, acute  Once,   R     05/24/18 2013   05/24/18 2014  HIV Antibody (routine testing w rflx)  Once,   R     05/24/18 2013   05/24/18 2004  Troponin I - Now Then Q6H  Now then every 6 hours,   R     05/24/18 2003    05/24/18 1552  Urinalysis, Routine w reflex microscopic  ONCE - STAT,   STAT     05/24/18 1551   05/24/18 1552  Urine rapid drug screen (hosp performed)  ONCE - STAT,   STAT     05/24/18 1551          Vitals/Pain Today's Vitals   05/24/18 2015 05/24/18 2016 05/24/18 2052 05/24/18 2100  BP:      Pulse: (!) 108  (!) 105 94  Resp:      Temp:      TempSrc:      SpO2: 97%  100% 100%  PainSc:  0-No pain      Isolation Precautions No active isolations  Medications Medications  sodium chloride flush (NS) 0.9 % injection 3 mL (has no administration in time range)  LORazepam (ATIVAN) injection 0-4 mg (2 mg Intravenous Given 05/24/18 1947)    Or  LORazepam (ATIVAN) tablet 0-4 mg ( Oral See Alternative 05/24/18 1947)  LORazepam (ATIVAN) injection 0-4 mg (has no administration in time range)    Or  LORazepam (ATIVAN) tablet 0-4 mg (has no administration in time range)  thiamine (VITAMIN B-1) tablet 100 mg ( Oral See Alternative 05/24/18 1947)    Or  thiamine (B-1) injection 100 mg (100 mg Intravenous Given 05/24/18 1947)  calcium carbonate (TUMS - dosed in mg elemental calcium) chewable tablet 200 mg of elemental calcium (has no administration in time range)  nitroGLYCERIN (NITROSTAT) SL tablet 0.4 mg (has no administration in time range)  morphine 2 MG/ML injection 2 mg (has no administration in time range)  nicotine (NICODERM CQ - dosed in mg/24 hours) patch 21 mg (has no administration in time range)  0.9 %  sodium chloride infusion (has no administration in time range)  sucralfate (CARAFATE) 1 GM/10ML suspension 1 g (has no administration in time range)  alum & mag hydroxide-simeth (MAALOX/MYLANTA) 200-200-20 MG/5ML suspension 30 mL (has no administration in time range)  pantoprazole (PROTONIX) EC tablet 40 mg (has no administration in time range)  hydrALAZINE (APRESOLINE) injection 5 mg (has no administration  in time range)  lisinopril (ZESTRIL) tablet 10 mg (has no administration  in time range)  acetaminophen (TYLENOL) tablet 650 mg (has no administration in time range)  enoxaparin (LOVENOX) injection 40 mg (has no administration in time range)  magnesium sulfate IVPB 2 g 50 mL (has no administration in time range)  sodium chloride 0.9 % bolus 1,000 mL (1,000 mLs Intravenous New Bag/Given 05/24/18 1831)  sodium chloride 0.9 % bolus 1,000 mL (1,000 mLs Intravenous New Bag/Given 05/24/18 2010)  potassium chloride SA (K-DUR) CR tablet 40 mEq (40 mEq Oral Given 05/24/18 1949)  iohexol (OMNIPAQUE) 350 MG/ML injection 75 mL (75 mLs Intravenous Contrast Given 05/24/18 1918)    Mobility walks     Focused Assessments    R Recommendations: See Admitting Provider Note  Report given to:   Additional Notes:

## 2018-05-24 NOTE — ED Triage Notes (Signed)
Pt reports chest pressure that's been ongoing for a month but has gotten worse. Seen at [facility] today but states the medicine they gave her today did not help (GI cocktail). Pt a.o, nad noted.

## 2018-05-24 NOTE — ED Notes (Signed)
Patient transported to CT 

## 2018-05-24 NOTE — H&P (Signed)
History and Physical    Yolanda Flores DOB: [birth year] DOA: 05/24/2018  Referring MD/NP/PA:   PCP: Patient, No Pcp Per   Patient coming from:  The patient is coming from home.  At baseline, pt is independent for most of ADL.        Chief Complaint: chest pain  HPI: Yolanda Flores is a 33 y.o. female with medical history significant of hypertension, alcohol abuse, tobacco abuse, who presents with chest pain.  Patient states that she has been having intermittent chest discomfort, chest pressure and chest pain for more than 1 month, which has worsened today.  The pain is located in the right side of the chest and also substernal area, pressure-like, moderate, some times radiating to the back. described as "Feels like a giant bubble in my chest causing pressure". It is associated with mild shortness of breath sometimes, but no cough, fever or chills.  No recent long distant traveling.  No tenderness in the calf area. Patient reports acid reflux symptoms.  No nausea, vomiting or diarrhea.  No symptoms of UTI or unilateral weakness. Patient was seen in urgent care prior to coming to the ED and treated with GI cocktail which did not improve symptoms. She admits to drinking a few shots and beers "almost daily."  Last alcohol was last night.  Occasional THC use, but denies other illicit drug use. States she has not taken her lisinopril for at least a month.    ED Course: pt was found to have negative troponin, WBC 4.3, lactic acid of 5.4, INR 0.9, lipase 73, potassium 3.0, negative pregnancy test, Tylenol level less than 10, salicylate level less than 7, COVID-19 test negative, alcohol level 60, d-dimer 2.12, abnormal liver function (AST 329, ALT 92, total bilirubin 1.4), temperature 99.1, blood pressure 163/114, tachycardia, oxygen saturation 99% on room air.  Chest x-ray negative.  CT angiogram of chest is negative for PE.  Patient is placed on telemetry bed for observation.  Review of  Systems:   General: no fevers, chills, no body weight gain, has fatigue HEENT: no blurry vision, hearing changes or sore throat Respiratory: no dyspnea, coughing, wheezing CV: has chest pain, no palpitations GI: no nausea, vomiting, abdominal pain, diarrhea, constipation. Has acid reflux GU: no dysuria, burning on urination, increased urinary frequency, hematuria  Ext: no leg edema Neuro: no unilateral weakness, numbness, or tingling, no vision change or hearing loss Skin: no rash, no skin tear. MSK: No muscle spasm, no deformity, no limitation of range of movement in spin Heme: No easy bruising.  Travel history: No recent long distant travel.  Allergy: No Known Allergies  Past Medical History:  Diagnosis Date  . Hypertension     Past Surgical History:  Procedure Laterality Date  . FOOT SURGERY      Social History:  reports that she has been smoking cigarettes. She has been smoking about 4.00 packs per day. She does not have any smokeless tobacco history on file. She reports current alcohol use. She reports that she does not use drugs.  Family History:  Family History  Problem Relation Age of Onset  . Hypertension Mother   . Hypertension Father   . Hypertension Brother      Prior to Admission medications   Medication Sig Start Date End Date Taking? Authorizing Provider  aspirin-sod bicarb-citric acid (ALKA-SELTZER) 325 MG TBEF tablet Take 325 mg by mouth every 6 (six) hours as needed (cold).    [provider]  Chlorphen-Pseudoephed-APAP (  THERAFLU FLU/COLD PO) Take 1 tablet by mouth every 4 (four) hours as needed (cold).    [provider]  gabapentin (NEURONTIN) 300 MG capsule Take 300 mg by mouth 3 (three) times daily.    [provider]  ibuprofen (ADVIL,MOTRIN) 200 MG tablet Take 800 mg by mouth every 6 (six) hours as needed for moderate pain.    [provider]  promethazine-codeine (PHENERGAN WITH CODEINE) 6.25-10 MG/5ML syrup Take  5 mLs by mouth every 6 (six) hours as needed for cough. 12/20/14   Junius Creamer, NP  pseudoephedrine (SUDAFED) 60 MG tablet Take 1 tablet (60 mg total) by mouth every 6 (six) hours as needed for congestion. 12/20/14   Junius Creamer, NP    Physical Exam: Vitals:   05/24/18 2052 05/24/18 2100 05/24/18 2248 05/25/18 0305  BP:   (!) 157/101 (!) 148/107  Pulse: (!) 105 94 96 90  Resp:   18 18  Temp:   99.8 F (37.7 C) 98.6 F (37 C)  TempSrc:   Oral Oral  SpO2: 100% 100% 100% 99%  Weight:   73.4 kg   Height:   5\' 6"  (1.676 m)    General: Not in acute distress HEENT:       Eyes: PERRL, EOMI, no scleral icterus.       ENT: No discharge from the ears and nose, no pharynx injection, no tonsillar enlargement.        Neck: No JVD, no bruit, no mass felt. Heme: No neck lymph node enlargement. Cardiac: S1/S2, RRR, No murmurs, No gallops or rubs. Respiratory: No rales, wheezing, rhonchi or rubs. GI: Soft, nondistended, nontender, no rebound pain, no organomegaly, BS present. GU: No hematuria Ext: No pitting leg edema bilaterally. 2+DP/PT pulse bilaterally. Musculoskeletal: No joint deformities, No joint redness or warmth, no limitation of ROM in spin. Skin: No rashes.  Neuro: Alert, oriented X3, cranial nerves II-XII grossly intact, moves all extremities normally.  Psych: Patient is not psychotic, no suicidal or hemocidal ideation.  Labs on Admission: I have personally reviewed following labs and imaging studies  CBC: Recent Labs  Lab 05/24/18 1333  WBC 4.3  4.3  NEUTROABS 3.2  HGB 12.4  12.3  HCT 34.7*  34.1*  MCV 100.6*  100.3*  PLT 229  782   Basic Metabolic Panel: Recent Labs  Lab 05/24/18 1333 05/24/18 1659  NA 141  --   K 3.0*  --   CL 92*  --   CO2 23  --   GLUCOSE 139*  --   BUN 5*  --   CREATININE 0.83  --   CALCIUM 9.1  --   MG  --  1.8   GFR: Estimated Creatinine Clearance: 101.5 mL/min (by C-G formula based on SCr of 0.83 mg/dL). Liver Function Tests:  Recent Labs  Lab 05/24/18 1655  AST 329*  ALT 92*  ALKPHOS 101  BILITOT 1.7*  PROT 8.7*  ALBUMIN 5.2*   Recent Labs  Lab 05/24/18 1655  LIPASE 73*   No results for input(s): AMMONIA in the last 168 hours. Coagulation Profile: Recent Labs  Lab 05/24/18 1809  INR 0.9   Cardiac Enzymes: Recent Labs  Lab 05/24/18 1333 05/24/18 2010 05/25/18 0403  TROPONINI <0.03 <0.03 <0.03   BNP (last 3 results) No results for input(s): PROBNP in the last 8760 hours. HbA1C: Recent Labs    05/25/18 0403  HGBA1C 5.0   CBG: No results for input(s): GLUCAP in the last 168 hours. Lipid Profile:  Recent Labs    05/25/18 0403  CHOL 389*  HDL >135  LDLCALC NOT CALCULATED  TRIG 77  CHOLHDL NOT CALCULATED   Thyroid Function Tests: No results for input(s): TSH, T4TOTAL, FREET4, T3FREE, THYROIDAB in the last 72 hours. Anemia Panel: No results for input(s): VITAMINB12, FOLATE, FERRITIN, TIBC, IRON, RETICCTPCT in the last 72 hours. Urine analysis:    Component Value Date/Time   COLORURINE YELLOW 05/25/2018 0326   APPEARANCEUR CLEAR 05/25/2018 0326   LABSPEC 1.010 05/25/2018 0326   PHURINE 7.5 05/25/2018 0326   GLUCOSEU NEGATIVE 05/25/2018 0326   HGBUR SMALL (A) 05/25/2018 0326   BILIRUBINUR NEGATIVE 05/25/2018 0326   KETONESUR 15 (A) 05/25/2018 0326   PROTEINUR 30 (A) 05/25/2018 0326   UROBILINOGEN 2.0 (H) 12/31/2009 1757   NITRITE NEGATIVE 05/25/2018 0326   LEUKOCYTESUR NEGATIVE 05/25/2018 0326   Sepsis Labs: @LABRCNTIP (procalcitonin:4,lacticidven:4) ) Recent Results (from the past 240 hour(s))  SARS Coronavirus 2 (CEPHEID- Performed in [place] hospital lab), Hosp Order     Status: None   Collection Time: 05/24/18  5:08 PM  Result Value Ref Range Status   SARS Coronavirus 2 NEGATIVE NEGATIVE Final    Comment: (NOTE) If result is NEGATIVE SARS-CoV-2 target nucleic acids are NOT DETECTED. The SARS-CoV-2 RNA is generally detectable in upper and lower  respiratory  specimens during the acute phase of infection. The lowest  concentration of SARS-CoV-2 viral copies this assay can detect is 250  copies / mL. A negative result does not preclude SARS-CoV-2 infection  and should not be used as the sole basis for treatment or other  patient management decisions.  A negative result may occur with  improper specimen collection / handling, submission of specimen other  than nasopharyngeal swab, presence of viral mutation(s) within the  areas targeted by this assay, and inadequate number of viral copies  (<250 copies / mL). A negative result must be combined with clinical  observations, patient history, and epidemiological information. If result is POSITIVE SARS-CoV-2 target nucleic acids are DETECTED. The SARS-CoV-2 RNA is generally detectable in upper and lower  respiratory specimens dur ing the acute phase of infection.  Positive  results are indicative of active infection with SARS-CoV-2.  Clinical  correlation with patient history and other diagnostic information is  necessary to determine patient infection status.  Positive results do  not rule out bacterial infection or co-infection with other viruses. If result is PRESUMPTIVE POSTIVE SARS-CoV-2 nucleic acids MAY BE PRESENT.   A presumptive positive result was obtained on the submitted specimen  and confirmed on repeat testing.  While 2019 novel coronavirus  (SARS-CoV-2) nucleic acids may be present in the submitted sample  additional confirmatory testing may be necessary for epidemiological  and / or clinical management purposes  to differentiate between  SARS-CoV-2 and other Sarbecovirus currently known to infect humans.  If clinically indicated additional testing with an alternate test  methodology [phone]) is advised. The SARS-CoV-2 RNA is generally  detectable in upper and lower respiratory sp ecimens during the acute  phase of infection. The expected result is Negative. Fact Sheet for  Patients:  StrictlyIdeas.no Fact Sheet for Healthcare Providers: BankingDealers.co.za This test is not yet approved or cleared by the [place] FDA and has been authorized for detection and/or diagnosis of SARS-CoV-2 by FDA under an Emergency Use Authorization (EUA).  This EUA will remain in effect (meaning this test can be used) for the duration of the COVID-19 declaration under Section 564(b)(1) of the Act, 21 U.S.C.  section 360bbb-3(b)(1), unless the authorization is terminated or revoked sooner. Performed at [facility], [place] [address] [place], [place] [address]      Radiological Exams on Admission: Dg Chest 2 View  Result Date: 05/24/2018 CLINICAL DATA:  33 year old female with a history of chest pain EXAM: CHEST - 2 VIEW COMPARISON:  Multiple prior most recent 12/20/2014 FINDINGS: Cardiomediastinal silhouette within normal limits in size and contour. No pneumothorax or pleural effusion. No confluent airspace disease. No displaced fracture. IMPRESSION: Negative for acute cardiopulmonary disease Electronically Signed   By: Corrie Mckusick D.O.   On: 05/24/2018 14:24   Ct Angio Chest Pe W And/or Wo Contrast  Result Date: 05/24/2018 CLINICAL DATA:  Evaluate for pulmonary embolus. Positive D-dimer. Chest pain. EXAM: CT ANGIOGRAPHY CHEST WITH CONTRAST TECHNIQUE: Multidetector CT imaging of the chest was performed using the standard protocol during bolus administration of intravenous contrast. Multiplanar CT image reconstructions and MIPs were obtained to evaluate the vascular anatomy. CONTRAST:  40mL OMNIPAQUE IOHEXOL 350 MG/ML SOLN COMPARISON:  None. FINDINGS: Cardiovascular: The heart size is normal. Main pulmonary artery appears patent. No saddle embolus or central obstructing pulmonary emboli identified. No lobar or segmental pulmonary artery filling defects identified. Mediastinum/Nodes: No enlarged mediastinal, hilar, or axillary  lymph nodes. Thyroid gland, trachea, and esophagus demonstrate no significant findings. Lungs/Pleura: No pleural effusion. No airspace consolidation or atelectasis. Upper Abdomen: Hepatic steatosis.  No acute abnormality. Musculoskeletal: No chest wall abnormality. No acute or significant osseous findings. Review of the MIP images confirms the above findings. IMPRESSION: 1. No evidence for acute pulmonary embolus. No acute cardiopulmonary abnormalities. 2. Suspect hepatic steatosis. 1. Electronically Signed   By: Kerby Moors M.D.   On: 05/24/2018 19:31     EKG: Independently reviewed.  Sinus rhythm, QTC 654, LAE, LAD, poor R wave progression  Assessment/Plan Principal Problem:   Chest pain Active Problems:   HTN (hypertension)   Hypokalemia   Lactic acidosis   Tobacco abuse   Alcohol abuse   Abnormal LFTs   Chest pain: pt seems to have atypical chest pain.  Differential diagnosis include alcoholic gastritis versus demand ischemia.  Patient did not take blood pressure medications for more than 1 month.  Blood pressure is elevated 163/114.  Initial troponin negative.  - will place on Tele bed for obs - cycle CE q6 x3 and repeat EKG in the am  - prn Nitroglycerin, Morphine - Risk factor stratification: will check FLP and A1C, UDS - will not start ASA due to possible alcoholic gastritis - Started Protonix, Mylanta, Carafate - f/u LE doppler to r/o DVT due to positive D-dimer.  HTN (hypertension): -start lisinopril 10 mg daily -IV hydralazine.  Hypokalemia: K= 3.0  on admission. - Repleted - Check Mg level-->1.8 - Give  2 g of magnesium sulfate  Lactic acidosis: Lactic acid 5.4.  No signs of infection.  Possibly due to alcohol abuse and starvation -IV fluid: 2 L normal saline bolus - 75 cc/h of normal saline -Trend lactic acid level  Tobacco abuse and Alcohol abuse: -Did counseling about importance of quitting smoking and drinking alcohol -Nicotine patch -CIWA protocol   Abnormal LFTs: likely due to alcohol abuse -Check hepatitis panel and HIV antibody -Avoid using liver toxic medication, such as Tylenol  QT prolongation: QTc 654: Likely due to hypokalemia -Repleted potassium as above - 2 g of magnesium sulfate was given to maintain magnesium level above 2   DVT ppx: SQ Lovenox Code Status: Full code Family Communication:  None at bed side.   Disposition Plan:  Anticipate discharge back to previous home environment Consults called:  none Admission status: Obs / tele   Date of Service 05/25/2018    [facility]   If 7PM-7AM, please contact night-coverage [email or web address] Password [facility] 05/25/2018, 6:33 AM

## 2018-05-24 NOTE — ED Provider Notes (Signed)
[facility] EMERGENCY DEPARTMENT Provider Note   CSN: [id number] Arrival date & time: 05/24/18  1323    History   Chief Complaint Chief Complaint  Patient presents with   Chest Pain    HPI DAWNMARIE BREON is a 33 y.o. female.     HPI   MONSERATH NEFF is a 33 y.o. female, with a history of HTN, presenting to the ED with chest pain.  States she has been experiencing chest pain for the last several days.  "Feels like a giant bubble in my chest causing pressure," sometimes radiating to the back, rated 9/10.  Pain improves with leaning forward and lying on the right side.  She also notes she has had instances like this over the last month, sometimes combined with shortness of breath.  Patient was seen in urgent care prior to coming to the ED and treated with GI cocktail which did not improve symptoms.  She states the taste of the cocktail made her vomit.  She is otherwise not experienced any nausea or vomiting.  She admits to drinking a few shots and beers "almost daily."  Last alcohol was last night.  Occasional THC use, but denies other illicit drug use. She will typically take about 800 mg ibuprofen every other day. 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  States she has not taken her lisinopril for at least a month.  However, when the urgent care told her blood pressure was high, she went home and took a dose of her lisinopril. Denies fever/chills, cough, abdominal pain, diarrhea, hematochezia/melena, syncope, dizziness, diaphoresis, neurologic deficits, or any other complaints.      Past Medical History:  Diagnosis Date   Hypertension     Patient Active Problem List   Diagnosis Date Noted   Chest pain 05/24/2018   HTN (hypertension) 05/24/2018   Hypokalemia 05/24/2018   Lactic acidosis 05/24/2018   Tobacco abuse 05/24/2018   Alcohol abuse 05/24/2018   Abnormal LFTs 05/24/2018   Prolonged QT interval     History reviewed. No pertinent surgical  history.   OB History   No obstetric history on file.      Home Medications    Prior to Admission medications   Medication Sig Start Date End Date Taking? Authorizing Provider  lisinopril-hydrochlorothiazide (ZESTORETIC) 10-12.5 MG tablet Take 1 tablet by mouth daily.   Yes [provider]    Family History No family history on file.  Social History Social History   Tobacco Use   Smoking status: Current Every Day Smoker    Packs/day: 4.00    Types: Cigarettes  Substance Use Topics   Alcohol use: Yes    Comment: occasionally   Drug use: No     Allergies   Patient has no known allergies.   Review of Systems Review of Systems  Constitutional: Negative for chills, diaphoresis and fever.  HENT: Negative for congestion.   Respiratory: Positive for shortness of breath. Negative for cough.   Cardiovascular: Positive for chest pain. Negative for palpitations and leg swelling.  Gastrointestinal: Negative for abdominal pain, blood in stool, constipation, diarrhea, nausea and vomiting.  Neurological: Negative for dizziness, syncope, weakness, light-headedness, numbness and headaches.  All other systems reviewed and are negative.     Physical Exam Updated Vital Signs BP (!) 147/101    Pulse 99    Temp 99.1 F (37.3 C) (Oral)    Resp 16    SpO2 100%   Physical Exam Vitals signs  and nursing note reviewed.  Constitutional:      General: She is not in acute distress.    Appearance: She is well-developed. She is not diaphoretic.  HENT:     Head: Normocephalic and atraumatic.     Mouth/Throat:     Mouth: Mucous membranes are moist.     Pharynx: Oropharynx is clear.  Eyes:     Extraocular Movements: Extraocular movements intact.     Conjunctiva/sclera: Conjunctivae normal.     Pupils: Pupils are equal, round, and reactive to light.  Neck:     Musculoskeletal: Neck supple.  Cardiovascular:     Rate and Rhythm: Regular rhythm. Tachycardia present.      Pulses: Normal pulses.          Radial pulses are 2+ on the right side and 2+ on the left side.       Posterior tibial pulses are 2+ on the right side and 2+ on the left side.     Heart sounds: Normal heart sounds.     Comments: Tactile temperature in the extremities appropriate and equal bilaterally. Mildly tachycardic around 104. Pulmonary:     Effort: Pulmonary effort is normal. No respiratory distress.     Breath sounds: Normal breath sounds.  Chest:     Chest wall: No tenderness.  Abdominal:     Palpations: Abdomen is soft.     Tenderness: There is no abdominal tenderness. There is no guarding.  Musculoskeletal:     Right lower leg: No edema.     Left lower leg: No edema.  Lymphadenopathy:     Cervical: No cervical adenopathy.  Skin:    General: Skin is warm and dry.  Neurological:     Mental Status: She is alert and oriented to person, place, and time.     Comments: Sensation grossly intact to light touch in the extremities.  Grip strengths equal bilaterally.  Strength 5/5 in all extremities. No gait disturbance. Coordination intact. Cranial nerves III-XII grossly intact. No facial droop.   Psychiatric:        Mood and Affect: Mood and affect normal.        Speech: Speech normal.        Behavior: Behavior normal.      ED Treatments / Results  Labs (all labs ordered are listed, but only abnormal results are displayed) Labs Reviewed  BASIC METABOLIC PANEL - Abnormal; Notable for the following components:      Result Value   Potassium 3.0 (*)    Chloride 92 (*)    Glucose, Bld 139 (*)    BUN 5 (*)    Anion gap 26 (*)    All other components within normal limits  CBC - Abnormal; Notable for the following components:   RBC 3.40 (*)    HCT 34.1 (*)    MCV 100.3 (*)    MCH 36.2 (*)    MCHC 36.1 (*)    nRBC 0.5 (*)    All other components within normal limits  ETHANOL - Abnormal; Notable for the following components:   Alcohol, Ethyl (B) 60 (*)    All other  components within normal limits  LACTIC ACID, PLASMA - Abnormal; Notable for the following components:   Lactic Acid, Venous 5.4 (*)    All other components within normal limits  LACTIC ACID, PLASMA - Abnormal; Notable for the following components:   Lactic Acid, Venous 2.8 (*)    All other components within normal limits  ACETAMINOPHEN  LEVEL - Abnormal; Notable for the following components:   Acetaminophen (Tylenol), Serum <10 (*)    All other components within normal limits  D-DIMER, QUANTITATIVE (NOT AT [facility]) - Abnormal; Notable for the following components:   D-Dimer, Quant 2.12 (*)    All other components within normal limits  HEPATIC FUNCTION PANEL - Abnormal; Notable for the following components:   Total Protein 8.7 (*)    Albumin 5.2 (*)    AST 329 (*)    ALT 92 (*)    Total Bilirubin 1.7 (*)    Bilirubin, Direct 0.3 (*)    Indirect Bilirubin 1.4 (*)    All other components within normal limits  LIPASE, BLOOD - Abnormal; Notable for the following components:   Lipase 73 (*)    All other components within normal limits  CBC WITH DIFFERENTIAL/PLATELET - Abnormal; Notable for the following components:   RBC 3.45 (*)    HCT 34.7 (*)    MCV 100.6 (*)    MCH 35.9 (*)    All other components within normal limits  SARS CORONAVIRUS 2 (HOSPITAL ORDER, [facility])  TROPONIN I  SALICYLATE LEVEL  MAGNESIUM  PROTIME-INR  TROPONIN I  URINALYSIS, ROUTINE W REFLEX MICROSCOPIC  RAPID URINE DRUG SCREEN, HOSP PERFORMED  HEMOGLOBIN A1C  LIPID PANEL  TROPONIN I  TROPONIN I  HEPATITIS PANEL, ACUTE  HIV ANTIBODY (ROUTINE TESTING W REFLEX)  I-STAT BETA HCG BLOOD, ED ([facility], [facility], [facility] ONLY)    EKG EKG Interpretation  Date/Time:  Monday May 24 2018 13:32:22 EDT Ventricular Rate:  104 PR Interval:    QRS Duration: 86 QT Interval:  498 QTC Calculation: 654 R Axis:   10 Text Interpretation:  Critical Test Result: Long QTc Sinus tachycardia Nonspecific T wave  abnormality Prolonged QT Abnormal ECG Since last EKG, QT is prolonged Confirmed by Duffy Bruce [phone]) on 05/24/2018 3:18:32 PM   Radiology Dg Chest 2 View  Result Date: 05/24/2018 CLINICAL DATA:  33 year old female with a history of chest pain EXAM: CHEST - 2 VIEW COMPARISON:  Multiple prior most recent 12/20/2014 FINDINGS: Cardiomediastinal silhouette within normal limits in size and contour. No pneumothorax or pleural effusion. No confluent airspace disease. No displaced fracture. IMPRESSION: Negative for acute cardiopulmonary disease Electronically Signed   By: Corrie Mckusick D.O.   On: 05/24/2018 14:24   Ct Angio Chest Pe W And/or Wo Contrast  Result Date: 05/24/2018 CLINICAL DATA:  Evaluate for pulmonary embolus. Positive D-dimer. Chest pain. EXAM: CT ANGIOGRAPHY CHEST WITH CONTRAST TECHNIQUE: Multidetector CT imaging of the chest was performed using the standard protocol during bolus administration of intravenous contrast. Multiplanar CT image reconstructions and MIPs were obtained to evaluate the vascular anatomy. CONTRAST:  33mL OMNIPAQUE IOHEXOL 350 MG/ML SOLN COMPARISON:  None. FINDINGS: Cardiovascular: The heart size is normal. Main pulmonary artery appears patent. No saddle embolus or central obstructing pulmonary emboli identified. No lobar or segmental pulmonary artery filling defects identified. Mediastinum/Nodes: No enlarged mediastinal, hilar, or axillary lymph nodes. Thyroid gland, trachea, and esophagus demonstrate no significant findings. Lungs/Pleura: No pleural effusion. No airspace consolidation or atelectasis. Upper Abdomen: Hepatic steatosis.  No acute abnormality. Musculoskeletal: No chest wall abnormality. No acute or significant osseous findings. Review of the MIP images confirms the above findings. IMPRESSION: 1. No evidence for acute pulmonary embolus. No acute cardiopulmonary abnormalities. 2. Suspect hepatic steatosis. 1. Electronically Signed   By: Kerby Moors M.D.    On: 05/24/2018 19:31    Procedures Procedures (  including critical care time)  Medications Ordered in ED Medications  sodium chloride flush (NS) 0.9 % injection 3 mL (has no administration in time range)  LORazepam (ATIVAN) injection 0-4 mg (2 mg Intravenous Given 05/24/18 1947)    Or  LORazepam (ATIVAN) tablet 0-4 mg ( Oral See Alternative 05/24/18 1947)  LORazepam (ATIVAN) injection 0-4 mg (has no administration in time range)    Or  LORazepam (ATIVAN) tablet 0-4 mg (has no administration in time range)  thiamine (VITAMIN B-1) tablet 100 mg ( Oral See Alternative 05/24/18 1947)    Or  thiamine (B-1) injection 100 mg (100 mg Intravenous Given 05/24/18 1947)  calcium carbonate (TUMS - dosed in mg elemental calcium) chewable tablet 200 mg of elemental calcium (has no administration in time range)  nitroGLYCERIN (NITROSTAT) SL tablet 0.4 mg (has no administration in time range)  morphine 2 MG/ML injection 2 mg (has no administration in time range)  nicotine (NICODERM CQ - dosed in mg/24 hours) patch 21 mg (has no administration in time range)  0.9 %  sodium chloride infusion (has no administration in time range)  sucralfate (CARAFATE) 1 GM/10ML suspension 1 g (has no administration in time range)  alum & mag hydroxide-simeth (MAALOX/MYLANTA) 200-200-20 MG/5ML suspension 30 mL (has no administration in time range)  pantoprazole (PROTONIX) EC tablet 40 mg (has no administration in time range)  hydrALAZINE (APRESOLINE) injection 5 mg (has no administration in time range)  lisinopril (ZESTRIL) tablet 10 mg (has no administration in time range)  acetaminophen (TYLENOL) tablet 650 mg (has no administration in time range)  enoxaparin (LOVENOX) injection 40 mg (has no administration in time range)  magnesium sulfate IVPB 2 g 50 mL (has no administration in time range)  sodium chloride 0.9 % bolus 1,000 mL (1,000 mLs Intravenous New Bag/Given 05/24/18 1831)  sodium chloride 0.9 % bolus 1,000 mL (1,000  mLs Intravenous New Bag/Given 05/24/18 2010)  potassium chloride SA (K-DUR) CR tablet 40 mEq (40 mEq Oral Given 05/24/18 1949)  iohexol (OMNIPAQUE) 350 MG/ML injection 75 mL (75 mLs Intravenous Contrast Given 05/24/18 1918)     Initial Impression / Assessment and Plan / ED Course  I have reviewed the triage vital signs and the nursing notes.  Pertinent labs & imaging results that were available during my care of the patient were reviewed by me and considered in my medical decision making (see chart for details).  Clinical Course as of May 23 2256  Mon May 24, 2018  1550 Anion gap(!): 26 [CI]  1958 Spoke with Dr. Blaine Hamper, hospitalist. Agrees to admit the patient.   [SJ]    Clinical Course User Index [CI] Duffy Bruce, MD [[facility]] Lorayne Bender, PA-C       Patient presents with chest discomfort.  Patient is nontoxic appearing, afebrile, not tachypneic, not hypotensive, maintains excellent SPO2 on room air, and is in no apparent distress.  Mildly tachycardic. She has a significant alcohol and NSAID use history, therefore, gastritis is on the list of possible differentials.  With her elevated lipase, alcoholic gastritis is also a possibility.  Due to the patient's alcohol use history, suspect there may also be some alcohol withdrawal involved in the patient's clinical picture.  Prolonged QT interval on EKG limited treatment options.  Anion gap is elevated, however, patient is alert and oriented without neurologic deficits, creatinine is normal, bicarb is normal.  There was initially some suspicion for COVID-19 in this patient with her chest discomfort, intermittent shortness of breath, and possible  exposure in her job as a [employer].  For this reason, since she was not hypotensive or altered, we did not initially treat aggressively with fluids in response to the lactic acid elevation.  Low suspicion for ACS. HEART score is 2, indicating low risk for a cardiac event.  EKG without evidence of  pathologic/symptomatic arrhythmia. Nonspecific T wave abnormalities.  Initial troponin negative. Dissection was considered, but thought less likely base on: History and description of the pain are not suggestive, patient is not ill-appearing, lack of risk factors, equal bilateral pulses, lack of neurologic deficits, no widened mediastinum on chest x-ray. Wells criteria score is 1.5, indicating low risk for PE.  D-dimer was positive at 2.12, CT PE study performed without acute abnormality noted, specifically no evidence of PE.  The patient is noted to have a lactate>4. With the current information available to me, I don't think the patient is in septic shock. The lactate>4, is related to dehydration and gastritis.  Findings and plan of care discussed with Duffy Bruce, MD.   Vitals:   05/24/18 1600 05/24/18 1613 05/24/18 1630 05/24/18 1645  BP: (!) 156/113  (!) 153/118 (!) 154/106  Pulse:  (!) 103 (!) 107 95  Resp:      Temp:      TempSrc:      SpO2:  100% 100% 100%     Final Clinical Impressions(s) / ED Diagnoses   Final diagnoses:  Prolonged QT interval  Atypical chest pain    ED Discharge Orders    None       Layla Maw 05/24/18 2311    Duffy Bruce, MD 05/25/18 1022

## 2018-05-24 NOTE — ED Notes (Signed)
Pt assisted to the restroom but unable to void at this time. Pt aware UA needed.

## 2018-05-25 ENCOUNTER — Observation Stay (HOSPITAL_COMMUNITY): Payer: BLUE CROSS/BLUE SHIELD

## 2018-05-25 ENCOUNTER — Encounter (HOSPITAL_COMMUNITY): Payer: Self-pay | Admitting: Internal Medicine

## 2018-05-25 DIAGNOSIS — F17210 Nicotine dependence, cigarettes, uncomplicated: Secondary | ICD-10-CM | POA: Diagnosis present

## 2018-05-25 DIAGNOSIS — R609 Edema, unspecified: Secondary | ICD-10-CM

## 2018-05-25 DIAGNOSIS — F1290 Cannabis use, unspecified, uncomplicated: Secondary | ICD-10-CM | POA: Diagnosis present

## 2018-05-25 DIAGNOSIS — E785 Hyperlipidemia, unspecified: Secondary | ICD-10-CM | POA: Diagnosis present

## 2018-05-25 DIAGNOSIS — Z791 Long term (current) use of non-steroidal anti-inflammatories (NSAID): Secondary | ICD-10-CM | POA: Diagnosis not present

## 2018-05-25 DIAGNOSIS — Z7982 Long term (current) use of aspirin: Secondary | ICD-10-CM | POA: Diagnosis not present

## 2018-05-25 DIAGNOSIS — K2920 Alcoholic gastritis without bleeding: Secondary | ICD-10-CM | POA: Diagnosis present

## 2018-05-25 DIAGNOSIS — K219 Gastro-esophageal reflux disease without esophagitis: Secondary | ICD-10-CM | POA: Diagnosis present

## 2018-05-25 DIAGNOSIS — R079 Chest pain, unspecified: Secondary | ICD-10-CM | POA: Diagnosis not present

## 2018-05-25 DIAGNOSIS — I10 Essential (primary) hypertension: Secondary | ICD-10-CM | POA: Diagnosis present

## 2018-05-25 DIAGNOSIS — Y903 Blood alcohol level of 60-79 mg/100 ml: Secondary | ICD-10-CM | POA: Diagnosis present

## 2018-05-25 DIAGNOSIS — Z79899 Other long term (current) drug therapy: Secondary | ICD-10-CM | POA: Diagnosis not present

## 2018-05-25 DIAGNOSIS — R9431 Abnormal electrocardiogram [ECG] [EKG]: Secondary | ICD-10-CM | POA: Diagnosis present

## 2018-05-25 DIAGNOSIS — E876 Hypokalemia: Secondary | ICD-10-CM | POA: Diagnosis present

## 2018-05-25 DIAGNOSIS — E872 Acidosis: Secondary | ICD-10-CM | POA: Diagnosis present

## 2018-05-25 DIAGNOSIS — Z8249 Family history of ischemic heart disease and other diseases of the circulatory system: Secondary | ICD-10-CM | POA: Diagnosis not present

## 2018-05-25 DIAGNOSIS — Z20828 Contact with and (suspected) exposure to other viral communicable diseases: Secondary | ICD-10-CM | POA: Diagnosis present

## 2018-05-25 DIAGNOSIS — R0789 Other chest pain: Secondary | ICD-10-CM

## 2018-05-25 DIAGNOSIS — F1010 Alcohol abuse, uncomplicated: Secondary | ICD-10-CM | POA: Diagnosis present

## 2018-05-25 LAB — URINALYSIS, ROUTINE W REFLEX MICROSCOPIC
Bilirubin Urine: NEGATIVE
Glucose, UA: NEGATIVE mg/dL
Ketones, ur: 15 mg/dL — AB
Leukocytes,Ua: NEGATIVE
Nitrite: NEGATIVE
Protein, ur: 30 mg/dL — AB
Specific Gravity, Urine: 1.01 (ref 1.005–1.030)
pH: 7.5 (ref 5.0–8.0)

## 2018-05-25 LAB — RAPID URINE DRUG SCREEN, HOSP PERFORMED
Amphetamines: NOT DETECTED
Barbiturates: NOT DETECTED
Benzodiazepines: NOT DETECTED
Cocaine: NOT DETECTED
Opiates: NOT DETECTED
Tetrahydrocannabinol: NOT DETECTED

## 2018-05-25 LAB — URINALYSIS, MICROSCOPIC (REFLEX)

## 2018-05-25 LAB — HEMOGLOBIN A1C
Hgb A1c MFr Bld: 5 % (ref 4.8–5.6)
Mean Plasma Glucose: 96.8 mg/dL

## 2018-05-25 LAB — TROPONIN I
Troponin I: <0.03 ng/mL (ref <0.03)
Troponin I: <0.03 ng/mL (ref <0.03)

## 2018-05-25 LAB — LIPID PANEL
Cholesterol: 389 mg/dL — ABNORMAL HIGH (ref 0–200)
HDL: >135 mg/dL (ref >40)
Triglycerides: 77 mg/dL (ref <150)
VLDL: 15 mg/dL (ref 0–40)

## 2018-05-25 LAB — HIV ANTIBODY (ROUTINE TESTING W REFLEX): HIV Screen 4th Generation wRfx: NONREACTIVE

## 2018-05-25 LAB — LACTIC ACID, PLASMA: Lactic Acid, Venous: 1.6 mmol/L (ref 0.5–1.9)

## 2018-05-25 MED ORDER — METOPROLOL TARTRATE 25 MG PO TABS
25.0000 mg | ORAL_TABLET | Freq: Two times a day (BID) | ORAL | Status: DC
  Administered 2018-05-25 – 2018-05-27 (×5): 25 mg via ORAL
  Filled 2018-05-25 (×5): qty 1

## 2018-05-25 MED ORDER — HYDRALAZINE HCL 20 MG/ML IJ SOLN
5.0000 mg | INTRAMUSCULAR | Status: DC | PRN
  Administered 2018-05-25: 5 mg via INTRAVENOUS

## 2018-05-25 MED ORDER — ENOXAPARIN SODIUM 40 MG/0.4ML ~~LOC~~ SOLN
40.0000 mg | Freq: Every day | SUBCUTANEOUS | Status: DC
  Filled 2018-05-25: qty 0.4

## 2018-05-25 NOTE — Progress Notes (Signed)
PROGRESS NOTE    Yolanda Flores  JYN:[id number] DOB: 02-13-1985 DOA: 05/24/2018 PCP: Patient, No Pcp Per    Brief Narrative:  33 y.o. female with medical history significant of hypertension, alcohol abuse, tobacco abuse, who presents with chest pain.  Patient states that she has been having intermittent chest discomfort, chest pressure and chest pain for more than 1 month, which has worsened today.  The pain is located in the right side of the chest and also substernal area, pressure-like, moderate, some times radiating to the back. described as "Feels like a giant bubble in my chest causing pressure". It is associated with mild shortness of breath sometimes, but no cough, fever or chills.  No recent long distant traveling.  No tenderness in the calf area. Patient reports acid reflux symptoms.  No nausea, vomiting or diarrhea.  No symptoms of UTI or unilateral weakness. Patient was seen in urgent care prior to coming to the ED and treated with GI cocktail which did not improve symptoms. She admits todrinkinga few shots and beers "almost daily." Last alcohol was last night. Occasional THC use, but denies other illicit drug use. States she has not taken her lisinopril for at least a month.    ED Course: pt was found to have negative troponin, WBC 4.3, lactic acid of 5.4, INR 0.9, lipase 73, potassium 3.0, negative pregnancy test, Tylenol level less than 10, salicylate level less than 7, COVID-19 test negative, alcohol level 60, d-dimer 2.12, abnormal liver function (AST 329, ALT 92, total bilirubin 1.4), temperature 99.1, blood pressure 163/114, tachycardia, oxygen saturation 99% on room air.  Chest x-ray negative.  CT angiogram of chest is negative for PE.  Patient is placed on telemetry bed for observation.   Assessment & Plan:   Principal Problem:   Chest pain Active Problems:   HTN (hypertension)   Hypokalemia   Lactic acidosis   Tobacco abuse   Alcohol abuse   Abnormal LFTs    Chest pain:  -pt seems to have atypical chest pain.   -Symptoms seem improved after protonix, mylanta, carafate -Suspicion for possible alcoholic gastritis/reflux -Serial troponin neg. -LE dopplers reviewed, no DVT  HTN (hypertension): -start lisinopril 10 mg daily -This AM, BP remains poorly controlled -Pt presents with stage 2 HTN, thus will add 25mg  bid metoprolol for added BP control -continue to titrate BP medication as tolerated.  Hypokalemia: K= 3.0  on admission. - Replaced -repeat bmet in AM  Lactic acidosis: Lactic acid 5.4.  No signs of infection.  Possibly due to alcohol abuse and starvation -IV fluid: 2 L normal saline bolus - Given IVF hydration with lactate down to 1.6  Tobacco abuse and Alcohol abuse: -Reports last ETOH intake was "a couple cups of wine" 2-3 days prior to visit -Did counseling about importance of quitting smoking and drinking alcohol -Nicotine patch -Continue CIWA protocol  Abnormal LFTs: likely due to alcohol abuse -hepatitis panel pending -HIV neg -Repeat LFT in AM  QT prolongation: QTc 654: Likely due to hypokalemia -Repleted potassium as above - 2 g of magnesium sulfate was given to maintain magnesium level above 2   DVT prophylaxis: Lovenox Code Status: Full Family Communication: Pt in room, family not at bedside Disposition Plan: Possible home in 24hrs if BP stable and if liver function improves  Consultants:     Procedures:     Antimicrobials: Anti-infectives (From admission, onward)   None       Subjective: Reports feeling better today, shaking resolved  Objective:  Vitals:   05/25/18 0305 05/25/18 1108 05/25/18 1255 05/25/18 1555  BP: (!) 148/107 (!) 161/118 (!) 153/105 (!) 151/110  Pulse: 90  (!) 114 87  Resp: 18   18  Temp: 98.6 F (37 C)   99.2 F (37.3 C)  TempSrc: Oral   Oral  SpO2: 99%   100%  Weight:      Height:        Intake/Output Summary (Last 24 hours) at 05/25/2018 1644 Last  data filed at 05/25/2018 1500 Gross per 24 hour  Intake 445.22 ml  Output 2000 ml  Net -1554.78 ml   Filed Weights   05/24/18 2248  Weight: 73.4 kg    Examination:  General exam: Appears calm and comfortable  Respiratory system: Clear to auscultation. Respiratory effort normal. Cardiovascular system: S1 & S2 heard, RRR Gastrointestinal system: Abdomen is nondistended, soft and nontender. No organomegaly or masses felt. Normal bowel sounds heard. Central nervous system: Alert and oriented. No focal neurological deficits. No tremors Extremities: Symmetric 5 x 5 power. Skin: No rashes, lesions  Psychiatry: Judgement and insight appear normal. Mood & affect appropriate.   Data Reviewed: I have personally reviewed following labs and imaging studies  CBC: Recent Labs  Lab 05/24/18 1333  WBC 4.3  4.3  NEUTROABS 3.2  HGB 12.4  12.3  HCT 34.7*  34.1*  MCV 100.6*  100.3*  PLT 229  325   Basic Metabolic Panel: Recent Labs  Lab 05/24/18 1333 05/24/18 1659  NA 141  --   K 3.0*  --   CL 92*  --   CO2 23  --   GLUCOSE 139*  --   BUN 5*  --   CREATININE 0.83  --   CALCIUM 9.1  --   MG  --  1.8   GFR: Estimated Creatinine Clearance: 101.5 mL/min (by C-G formula based on SCr of 0.83 mg/dL). Liver Function Tests: Recent Labs  Lab 05/24/18 1655  AST 329*  ALT 92*  ALKPHOS 101  BILITOT 1.7*  PROT 8.7*  ALBUMIN 5.2*   Recent Labs  Lab 05/24/18 1655  LIPASE 73*   No results for input(s): AMMONIA in the last 168 hours. Coagulation Profile: Recent Labs  Lab 05/24/18 1809  INR 0.9   Cardiac Enzymes: Recent Labs  Lab 05/24/18 1333 05/24/18 2010 05/25/18 0403 05/25/18 0755  TROPONINI <0.03 <0.03 <0.03 <0.03   BNP (last 3 results) No results for input(s): PROBNP in the last 8760 hours. HbA1C: Recent Labs    05/25/18 0403  HGBA1C 5.0   CBG: No results for input(s): GLUCAP in the last 168 hours. Lipid Profile: Recent Labs    05/25/18 0403  CHOL  389*  HDL >135  LDLCALC NOT CALCULATED  TRIG 77  CHOLHDL NOT CALCULATED   Thyroid Function Tests: No results for input(s): TSH, T4TOTAL, FREET4, T3FREE, THYROIDAB in the last 72 hours. Anemia Panel: No results for input(s): VITAMINB12, FOLATE, FERRITIN, TIBC, IRON, RETICCTPCT in the last 72 hours. Sepsis Labs: Recent Labs  Lab 05/24/18 1710 05/24/18 2015 05/25/18 0403  LATICACIDVEN 5.4* 2.8* 1.6    Recent Results (from the past 240 hour(s))  SARS Coronavirus 2 (CEPHEID- Performed in [place] hospital lab), Hosp Order     Status: None   Collection Time: 05/24/18  5:08 PM  Result Value Ref Range Status   SARS Coronavirus 2 NEGATIVE NEGATIVE Final    Comment: (NOTE) If result is NEGATIVE SARS-CoV-2 target nucleic acids are NOT DETECTED. The SARS-CoV-2 RNA is  generally detectable in upper and lower  respiratory specimens during the acute phase of infection. The lowest  concentration of SARS-CoV-2 viral copies this assay can detect is 250  copies / mL. A negative result does not preclude SARS-CoV-2 infection  and should not be used as the sole basis for treatment or other  patient management decisions.  A negative result may occur with  improper specimen collection / handling, submission of specimen other  than nasopharyngeal swab, presence of viral mutation(s) within the  areas targeted by this assay, and inadequate number of viral copies  (<250 copies / mL). A negative result must be combined with clinical  observations, patient history, and epidemiological information. If result is POSITIVE SARS-CoV-2 target nucleic acids are DETECTED. The SARS-CoV-2 RNA is generally detectable in upper and lower  respiratory specimens dur ing the acute phase of infection.  Positive  results are indicative of active infection with SARS-CoV-2.  Clinical  correlation with patient history and other diagnostic information is  necessary to determine patient infection status.  Positive results  do  not rule out bacterial infection or co-infection with other viruses. If result is PRESUMPTIVE POSTIVE SARS-CoV-2 nucleic acids MAY BE PRESENT.   A presumptive positive result was obtained on the submitted specimen  and confirmed on repeat testing.  While 2019 novel coronavirus  (SARS-CoV-2) nucleic acids may be present in the submitted sample  additional confirmatory testing may be necessary for epidemiological  and / or clinical management purposes  to differentiate between  SARS-CoV-2 and other Sarbecovirus currently known to infect humans.  If clinically indicated additional testing with an alternate test  methodology [phone]) is advised. The SARS-CoV-2 RNA is generally  detectable in upper and lower respiratory sp ecimens during the acute  phase of infection. The expected result is Negative. Fact Sheet for Patients:  StrictlyIdeas.no Fact Sheet for Healthcare Providers: BankingDealers.co.za This test is not yet approved or cleared by the [place] FDA and has been authorized for detection and/or diagnosis of SARS-CoV-2 by FDA under an Emergency Use Authorization (EUA).  This EUA will remain in effect (meaning this test can be used) for the duration of the COVID-19 declaration under Section 564(b)(1) of the Act, 21 U.S.C. section 360bbb-3(b)(1), unless the authorization is terminated or revoked sooner. Performed at [facility], [place] [address] [place], [place] [address]      Radiology Studies: Dg Chest 2 View  Result Date: 05/24/2018 CLINICAL DATA:  33 year old female with a history of chest pain EXAM: CHEST - 2 VIEW COMPARISON:  Multiple prior most recent 12/20/2014 FINDINGS: Cardiomediastinal silhouette within normal limits in size and contour. No pneumothorax or pleural effusion. No confluent airspace disease. No displaced fracture. IMPRESSION: Negative for acute cardiopulmonary disease Electronically Signed   By:  Corrie Mckusick D.O.   On: 05/24/2018 14:24   Ct Angio Chest Pe W And/or Wo Contrast  Result Date: 05/24/2018 CLINICAL DATA:  Evaluate for pulmonary embolus. Positive D-dimer. Chest pain. EXAM: CT ANGIOGRAPHY CHEST WITH CONTRAST TECHNIQUE: Multidetector CT imaging of the chest was performed using the standard protocol during bolus administration of intravenous contrast. Multiplanar CT image reconstructions and MIPs were obtained to evaluate the vascular anatomy. CONTRAST:  29mL OMNIPAQUE IOHEXOL 350 MG/ML SOLN COMPARISON:  None. FINDINGS: Cardiovascular: The heart size is normal. Main pulmonary artery appears patent. No saddle embolus or central obstructing pulmonary emboli identified. No lobar or segmental pulmonary artery filling defects identified. Mediastinum/Nodes: No enlarged mediastinal, hilar, or axillary lymph nodes. Thyroid gland,  trachea, and esophagus demonstrate no significant findings. Lungs/Pleura: No pleural effusion. No airspace consolidation or atelectasis. Upper Abdomen: Hepatic steatosis.  No acute abnormality. Musculoskeletal: No chest wall abnormality. No acute or significant osseous findings. Review of the MIP images confirms the above findings. IMPRESSION: 1. No evidence for acute pulmonary embolus. No acute cardiopulmonary abnormalities. 2. Suspect hepatic steatosis. 1. Electronically Signed   By: Kerby Moors M.D.   On: 05/24/2018 19:31   Vas [place] Lower Extremity Venous (dvt)  Result Date: 05/25/2018  Lower Venous Study Indications: Edema.  Performing Technologist: Toma Copier RVS  Examination Guidelines: A complete evaluation includes B-mode imaging, spectral Doppler, color Doppler, and power Doppler as needed of all accessible portions of each vessel. Bilateral testing is considered an integral part of a complete examination. Limited examinations for reoccurring indications may be performed as noted.  +---------+---------------+---------+-----------+----------+-------+ RIGHT     CompressibilityPhasicitySpontaneityPropertiesSummary +---------+---------------+---------+-----------+----------+-------+ CFV      Full           Yes      Yes                          +---------+---------------+---------+-----------+----------+-------+ SFJ      Full                                                 +---------+---------------+---------+-----------+----------+-------+ FV Prox  Full           Yes      Yes                          +---------+---------------+---------+-----------+----------+-------+ FV Mid   Full                                                 +---------+---------------+---------+-----------+----------+-------+ FV DistalFull           Yes      Yes                          +---------+---------------+---------+-----------+----------+-------+ PFV      Full           Yes      Yes                          +---------+---------------+---------+-----------+----------+-------+ POP      Full           Yes      Yes                          +---------+---------------+---------+-----------+----------+-------+ PTV      Full                                                 +---------+---------------+---------+-----------+----------+-------+ PERO     Full                                                 +---------+---------------+---------+-----------+----------+-------+   +---------+---------------+---------+-----------+----------+-------+  LEFT     CompressibilityPhasicitySpontaneityPropertiesSummary +---------+---------------+---------+-----------+----------+-------+ CFV      Full           Yes      Yes                          +---------+---------------+---------+-----------+----------+-------+ SFJ      Full                                                 +---------+---------------+---------+-----------+----------+-------+ FV Prox  Full           Yes      Yes                           +---------+---------------+---------+-----------+----------+-------+ FV Mid   Full                                                 +---------+---------------+---------+-----------+----------+-------+ FV DistalFull           Yes      Yes                          +---------+---------------+---------+-----------+----------+-------+ PFV      Full           Yes      Yes                          +---------+---------------+---------+-----------+----------+-------+ POP      Full           Yes      Yes                          +---------+---------------+---------+-----------+----------+-------+ PTV      Full                                                 +---------+---------------+---------+-----------+----------+-------+ PERO     Full                                                 +---------+---------------+---------+-----------+----------+-------+     Summary: Right: There is no evidence of deep vein thrombosis in the lower extremity. No cystic structure found in the popliteal fossa. Left: There is no evidence of deep vein thrombosis in the lower extremity. No cystic structure found in the popliteal fossa.  *See table(s) above for measurements and observations.    Preliminary     Scheduled Meds: . [START ON 05/26/2018] enoxaparin (LOVENOX) injection  40 mg Subcutaneous QHS  . lisinopril  10 mg Oral Daily  . LORazepam  0-4 mg Intravenous Q6H   Or  . LORazepam  0-4 mg Oral Q6H  . [START ON 05/27/2018] LORazepam  0-4 mg Intravenous Q12H   Or  . [START ON 05/27/2018] LORazepam  0-4 mg Oral Q12H  .  metoprolol tartrate  25 mg Oral BID  . nicotine  21 mg Transdermal Daily  . pantoprazole  40 mg Oral Q1200  . sodium chloride flush  3 mL Intravenous Once  . sucralfate  1 g Oral TID WC & HS  . thiamine  100 mg Oral Daily   Or  . thiamine  100 mg Intravenous Daily   Continuous Infusions: . sodium chloride 75 mL/hr at 05/25/18 1254     LOS: 0 days   Marylu Lund, MD  [facility] Pager On Amion  If 7PM-7AM, please contact night-coverage 05/25/2018, 4:44 PM

## 2018-05-25 NOTE — Progress Notes (Signed)
Bilateral lower extremity venous duplex competed. Results in Chart review [facility] [place]. Landon Bassford,RVS 05/25/2018,10:11 AM

## 2018-05-26 LAB — COMPREHENSIVE METABOLIC PANEL
ALT: 85 U/L — ABNORMAL HIGH (ref 0–44)
AST: 328 U/L — ABNORMAL HIGH (ref 15–41)
Albumin: 4.4 g/dL (ref 3.5–5.0)
Alkaline Phosphatase: 98 U/L (ref 38–126)
Anion gap: 13 (ref 5–15)
BUN: <5 mg/dL — ABNORMAL LOW (ref 6–20)
CO2: 27 mmol/L (ref 22–32)
Calcium: 8.6 mg/dL — ABNORMAL LOW (ref 8.9–10.3)
Chloride: 95 mmol/L — ABNORMAL LOW (ref 98–111)
Creatinine, Ser: 0.68 mg/dL (ref 0.44–1.00)
GFR calc Af Amer: >60 mL/min (ref >60)
GFR calc non Af Amer: >60 mL/min (ref >60)
Glucose, Bld: 100 mg/dL — ABNORMAL HIGH (ref 70–99)
Potassium: 3.4 mmol/L — ABNORMAL LOW (ref 3.5–5.1)
Sodium: 135 mmol/L (ref 135–145)
Total Bilirubin: 2.4 mg/dL — ABNORMAL HIGH (ref 0.3–1.2)
Total Protein: 7.7 g/dL (ref 6.5–8.1)

## 2018-05-26 LAB — HEPATITIS PANEL, ACUTE
HCV Ab: <0.1 s/co ratio (ref 0.0–0.9)
Hep A IgM: NEGATIVE
Hep B C IgM: NEGATIVE
Hepatitis B Surface Ag: NEGATIVE

## 2018-05-26 MED ORDER — POTASSIUM CHLORIDE CRYS ER 20 MEQ PO TBCR
40.0000 meq | EXTENDED_RELEASE_TABLET | Freq: Once | ORAL | Status: AC
  Administered 2018-05-26: 40 meq via ORAL
  Filled 2018-05-26: qty 2

## 2018-05-26 NOTE — Plan of Care (Signed)
  Problem: Education: Goal: Knowledge of General Education information will improve Description: Including pain rating scale, medication(s)/side effects and non-pharmacologic comfort measures Outcome: Progressing   Problem: Clinical Measurements: Goal: Will remain free from infection Outcome: Progressing Goal: Cardiovascular complication will be avoided Outcome: Progressing   Problem: Coping: Goal: Level of anxiety will decrease Outcome: Progressing   

## 2018-05-26 NOTE — Progress Notes (Signed)
PROGRESS NOTE  Yolanda Flores:[id number] DOB: 12-22-1985 DOA: 05/24/2018 PCP: Patient, No Pcp Per  HPI/Recap of past 24 hours: 33 y.o.femalewith medical history significant ofhypertension, alcohol abuse, tobacco abuse, who presents with chest pain.  Patient states that she has been having intermittent chest discomfort,chest pressure and chest pain for more than 1 month, which has worsened today. The pain is located in the right side of the chest and also substernal area, pressure-like, moderate, some timesradiating to the back.described as"Feels like a giant bubble in my chest causing pressure".It is associated with mild shortness of breathsometimes, but no cough, fever or chills. No recent long distanttraveling. No tenderness in the calf area.Patient reports acid reflux symptoms. No nausea,vomiting or diarrhea. No symptoms of UTI or unilateral weakness. Patient was seen in urgent care prior to coming to the ED and treated with GI cocktail which did not improve symptoms. She admits todrinkinga few shots and beers "almost daily." Last alcohol was last night. Occasional THC use, but denies other illicit drug use. States she has not taken her lisinopril for at least a month.  ED Course:pt was found to have negative troponin, WBC 4.3, lactic acid of 5.4, INR 0.9, lipase 73,potassium 3.0,negative pregnancy test, Tylenol level less than 10, salicylate level less than 7, COVID-19 test negative, alcohol level 60, d-dimer 2.12, abnormal liver function (AST 329, ALT 92, total bilirubin 1.4), temperature 99.1, blood pressure 163/114, tachycardia, oxygen saturation 99% on room air. Chest x-ray negative. CT angiogram of chest is negative for PE. Blood pressure uncontrolled with concern for alcohol withdrawal.  05/26/18: Patient seen and examined at bedside.  Reports intermittent chest pain improved with nitroglycerin sublingual.  Denies any dyspnea at rest.  Assessment/Plan:  Principal Problem:   Chest pain Active Problems:   HTN (hypertension)   Hypokalemia   Lactic acidosis   Tobacco abuse   Alcohol abuse   Abnormal LFTs  Chest pain rule out ACS Continues to have intermittent chest pain which is improved with nitro sublingual Troponin negative x3 Twelve-lead EKG in sinus rhythm with no specific ST-T changes Bilateral lower extremity Doppler ultrasound negative for DVT CTA PE negative  Uncontrolled hypertension Was started on lisinopril 10 mg daily Also on metoprolol 25 mg twice daily  Alcohol abuse with concern for withdrawal Still jittery on exam Day #4 post alcohol intake Will reassess in the morning  Hepatic steatosis in the setting of alcohol abuse Elevated LFTs Bedside counseling on alcohol cessation  Elevated LFTs likely secondary to alcohol abuse Repeat CMP in the morning Hepatitis panel negative  Hypokalemia Potassium 3.4 Repleted    DVT prophylaxis: Lovenox subcu Code Status: Full Family Communication: Pt in room, family not at bedside Disposition Plan:  Possible discharge tomorrow 05/27/2018 Consultants:  None   Objective: Vitals:   05/25/18 2049 05/26/18 0001 05/26/18 0600 05/26/18 1416  BP: (!) 143/111 (!) 147/117 (!) 141/109 (!) 144/111  Pulse: 89 82 84 70  Resp: 18  18   Temp: 99.8 F (37.7 C)  99 F (37.2 C) 98.9 F (37.2 C)  TempSrc: Oral  Oral Oral  SpO2: 100%  100% 99%  Weight:   73.4 kg   Height:        Intake/Output Summary (Last 24 hours) at 05/26/2018 1436 Last data filed at 05/26/2018 1417 Gross per 24 hour  Intake 700 ml  Output 2350 ml  Net -1650 ml   Filed Weights   05/24/18 2248 05/26/18 0600  Weight: 73.4 kg 73.4 kg  Exam:  . General: 33 y.o. year-old female well developed well nourished in no acute distress.  Alert and oriented x3. . Cardiovascular: Regular rate and rhythm with no rubs or gallops.  No thyromegaly or JVD noted.   Marland Kitchen Respiratory: Clear to auscultation with no  wheezes or rales. Good inspiratory effort. . Abdomen: Soft nontender nondistended with normal bowel sounds x4 quadrants. . Musculoskeletal: No lower extremity edema. 2/4 pulses in all 4 extremities. . Skin: No ulcerative lesions noted or rashes, . Psychiatry: Mood is appropriate for condition and setting   Data Reviewed: CBC: Recent Labs  Lab 05/24/18 1333  WBC 4.3  4.3  NEUTROABS 3.2  HGB 12.4  12.3  HCT 34.7*  34.1*  MCV 100.6*  100.3*  PLT 229  979   Basic Metabolic Panel: Recent Labs  Lab 05/24/18 1333 05/24/18 1659 05/26/18 0355  NA 141  --  135  K 3.0*  --  3.4*  CL 92*  --  95*  CO2 23  --  27  GLUCOSE 139*  --  100*  BUN 5*  --  <5*  CREATININE 0.83  --  0.68  CALCIUM 9.1  --  8.6*  MG  --  1.8  --    GFR: Estimated Creatinine Clearance: 105.3 mL/min (by C-G formula based on SCr of 0.68 mg/dL). Liver Function Tests: Recent Labs  Lab 05/24/18 1655 05/26/18 0355  AST 329* 328*  ALT 92* 85*  ALKPHOS 101 98  BILITOT 1.7* 2.4*  PROT 8.7* 7.7  ALBUMIN 5.2* 4.4   Recent Labs  Lab 05/24/18 1655  LIPASE 73*   No results for input(s): AMMONIA in the last 168 hours. Coagulation Profile: Recent Labs  Lab 05/24/18 1809  INR 0.9   Cardiac Enzymes: Recent Labs  Lab 05/24/18 1333 05/24/18 2010 05/25/18 0403 05/25/18 0755  TROPONINI <0.03 <0.03 <0.03 <0.03   BNP (last 3 results) No results for input(s): PROBNP in the last 8760 hours. HbA1C: Recent Labs    05/25/18 0403  HGBA1C 5.0   CBG: No results for input(s): GLUCAP in the last 168 hours. Lipid Profile: Recent Labs    05/25/18 0403  CHOL 389*  HDL >135  LDLCALC NOT CALCULATED  TRIG 77  CHOLHDL NOT CALCULATED   Thyroid Function Tests: No results for input(s): TSH, T4TOTAL, FREET4, T3FREE, THYROIDAB in the last 72 hours. Anemia Panel: No results for input(s): VITAMINB12, FOLATE, FERRITIN, TIBC, IRON, RETICCTPCT in the last 72 hours. Urine analysis:    Component Value  Date/Time   COLORURINE YELLOW 05/25/2018 0326   APPEARANCEUR CLEAR 05/25/2018 0326   LABSPEC 1.010 05/25/2018 0326   PHURINE 7.5 05/25/2018 0326   GLUCOSEU NEGATIVE 05/25/2018 0326   HGBUR SMALL (A) 05/25/2018 0326   BILIRUBINUR NEGATIVE 05/25/2018 0326   KETONESUR 15 (A) 05/25/2018 0326   PROTEINUR 30 (A) 05/25/2018 0326   UROBILINOGEN 2.0 (H) 12/31/2009 1757   NITRITE NEGATIVE 05/25/2018 0326   LEUKOCYTESUR NEGATIVE 05/25/2018 0326   Sepsis Labs: @LABRCNTIP (procalcitonin:4,lacticidven:4)  ) Recent Results (from the past 240 hour(s))  SARS Coronavirus 2 (CEPHEID- Performed in [place] hospital lab), Hosp Order     Status: None   Collection Time: 05/24/18  5:08 PM  Result Value Ref Range Status   SARS Coronavirus 2 NEGATIVE NEGATIVE Final    Comment: (NOTE) If result is NEGATIVE SARS-CoV-2 target nucleic acids are NOT DETECTED. The SARS-CoV-2 RNA is generally detectable in upper and lower  respiratory specimens during the acute phase of infection. The lowest  concentration of SARS-CoV-2 viral copies this assay can detect is 250  copies / mL. A negative result does not preclude SARS-CoV-2 infection  and should not be used as the sole basis for treatment or other  patient management decisions.  A negative result may occur with  improper specimen collection / handling, submission of specimen other  than nasopharyngeal swab, presence of viral mutation(s) within the  areas targeted by this assay, and inadequate number of viral copies  (<250 copies / mL). A negative result must be combined with clinical  observations, patient history, and epidemiological information. If result is POSITIVE SARS-CoV-2 target nucleic acids are DETECTED. The SARS-CoV-2 RNA is generally detectable in upper and lower  respiratory specimens dur ing the acute phase of infection.  Positive  results are indicative of active infection with SARS-CoV-2.  Clinical  correlation with patient history and other  diagnostic information is  necessary to determine patient infection status.  Positive results do  not rule out bacterial infection or co-infection with other viruses. If result is PRESUMPTIVE POSTIVE SARS-CoV-2 nucleic acids MAY BE PRESENT.   A presumptive positive result was obtained on the submitted specimen  and confirmed on repeat testing.  While 2019 novel coronavirus  (SARS-CoV-2) nucleic acids may be present in the submitted sample  additional confirmatory testing may be necessary for epidemiological  and / or clinical management purposes  to differentiate between  SARS-CoV-2 and other Sarbecovirus currently known to infect humans.  If clinically indicated additional testing with an alternate test  methodology [phone]) is advised. The SARS-CoV-2 RNA is generally  detectable in upper and lower respiratory sp ecimens during the acute  phase of infection. The expected result is Negative. Fact Sheet for Patients:  StrictlyIdeas.no Fact Sheet for Healthcare Providers: BankingDealers.co.za This test is not yet approved or cleared by the [place] FDA and has been authorized for detection and/or diagnosis of SARS-CoV-2 by FDA under an Emergency Use Authorization (EUA).  This EUA will remain in effect (meaning this test can be used) for the duration of the COVID-19 declaration under Section 564(b)(1) of the Act, 21 U.S.C. section 360bbb-3(b)(1), unless the authorization is terminated or revoked sooner. Performed at [facility], [place] [address] [place], [place] [address]       Studies: No results found.  Scheduled Meds: . enoxaparin (LOVENOX) injection  40 mg Subcutaneous QHS  . lisinopril  10 mg Oral Daily  . LORazepam  0-4 mg Intravenous Q6H   Or  . LORazepam  0-4 mg Oral Q6H  . [START ON 05/27/2018] LORazepam  0-4 mg Intravenous Q12H   Or  . [START ON 05/27/2018] LORazepam  0-4 mg Oral Q12H  . metoprolol tartrate   25 mg Oral BID  . nicotine  21 mg Transdermal Daily  . pantoprazole  40 mg Oral Q1200  . sucralfate  1 g Oral TID WC & HS  . thiamine  100 mg Oral Daily   Or  . thiamine  100 mg Intravenous Daily    Continuous Infusions:   LOS: 1 day     Kayleen Memos, MD [facility] Pager [phone]  If 7PM-7AM, please contact night-coverage [email or web address] Password [facility] 05/26/2018, 2:36 PM

## 2018-05-27 LAB — COMPREHENSIVE METABOLIC PANEL
ALT: 112 U/L — ABNORMAL HIGH (ref 0–44)
AST: 388 U/L — ABNORMAL HIGH (ref 15–41)
Albumin: 4.5 g/dL (ref 3.5–5.0)
Alkaline Phosphatase: 94 U/L (ref 38–126)
Anion gap: 12 (ref 5–15)
BUN: <5 mg/dL — ABNORMAL LOW (ref 6–20)
CO2: 26 mmol/L (ref 22–32)
Calcium: 9.3 mg/dL (ref 8.9–10.3)
Chloride: 99 mmol/L (ref 98–111)
Creatinine, Ser: 0.69 mg/dL (ref 0.44–1.00)
GFR calc Af Amer: >60 mL/min (ref >60)
GFR calc non Af Amer: >60 mL/min (ref >60)
Glucose, Bld: 103 mg/dL — ABNORMAL HIGH (ref 70–99)
Potassium: 3.7 mmol/L (ref 3.5–5.1)
Sodium: 137 mmol/L (ref 135–145)
Total Bilirubin: 2.3 mg/dL — ABNORMAL HIGH (ref 0.3–1.2)
Total Protein: 7.9 g/dL (ref 6.5–8.1)

## 2018-05-27 LAB — CBC
HCT: 35.6 % — ABNORMAL LOW (ref 36.0–46.0)
Hemoglobin: 12.4 g/dL (ref 12.0–15.0)
MCH: 36.4 pg — ABNORMAL HIGH (ref 26.0–34.0)
MCHC: 34.8 g/dL (ref 30.0–36.0)
MCV: 104.4 fL — ABNORMAL HIGH (ref 80.0–100.0)
Platelets: 122 10*3/uL — ABNORMAL LOW (ref 150–400)
RBC: 3.41 MIL/uL — ABNORMAL LOW (ref 3.87–5.11)
RDW: 13.8 % (ref 11.5–15.5)
WBC: 4.4 10*3/uL (ref 4.0–10.5)
nRBC: 0 % (ref 0.0–0.2)

## 2018-05-27 LAB — LIPASE, BLOOD: Lipase: 75 U/L — ABNORMAL HIGH (ref 11–51)

## 2018-05-27 MED ORDER — LISINOPRIL 20 MG PO TABS
20.0000 mg | ORAL_TABLET | Freq: Every day | ORAL | Status: DC
  Administered 2018-05-27: 20 mg via ORAL
  Filled 2018-05-27: qty 1

## 2018-05-27 MED ORDER — NICOTINE 21 MG/24HR TD PT24: 21.0000 mg | MEDICATED_PATCH | Freq: Every day | TRANSDERMAL | 0 refills | Status: DC

## 2018-05-27 MED ORDER — LISINOPRIL 20 MG PO TABS: 20.0000 mg | ORAL_TABLET | Freq: Every day | ORAL | 0 refills | Status: DC

## 2018-05-27 MED ORDER — METOPROLOL TARTRATE 25 MG PO TABS: 25.0000 mg | ORAL_TABLET | Freq: Two times a day (BID) | ORAL | 0 refills | Status: DC

## 2018-05-27 MED ORDER — FOLIC ACID 1 MG PO TABS: 1.0000 mg | ORAL_TABLET | Freq: Every day | ORAL | 0 refills | Status: AC

## 2018-05-27 MED ORDER — SUCRALFATE 1 GM/10ML PO SUSP: 1.0000 g | Freq: Three times a day (TID) | ORAL | 0 refills | Status: DC

## 2018-05-27 MED ORDER — THIAMINE HCL 100 MG PO TABS: 100.0000 mg | ORAL_TABLET | Freq: Every day | ORAL | 0 refills | Status: DC

## 2018-05-27 MED ORDER — PANTOPRAZOLE SODIUM 40 MG PO TBEC: 40.0000 mg | DELAYED_RELEASE_TABLET | Freq: Every day | ORAL | 0 refills | Status: DC

## 2018-05-27 NOTE — Discharge Summary (Addendum)
Discharge Summary  Yolanda Flores [facility]:[id number] DOB: 18-Apr-1985  PCP: Patient, No Pcp Per  Admit date: 05/24/2018 Discharge date: 05/27/2018  Time spent: 35 minutes  Recommendations for Outpatient Follow-up:  1. Follow-up with your primary care provider 2. Please call wellness center if you do not have a primary care provider to set up an appointment 3. Take your medications as prescribed 4. Completely abstain from alcohol use  Discharge Diagnoses:  Active Hospital Problems   Diagnosis Date Noted   Chest pain 05/24/2018   HTN (hypertension) 05/24/2018   Hypokalemia 05/24/2018   Lactic acidosis 05/24/2018   Tobacco abuse 05/24/2018   Alcohol abuse 05/24/2018   Abnormal LFTs 05/24/2018    Resolved Hospital Problems  No resolved problems to display.    Discharge Condition: Stable  Diet recommendation: Resume previous diet  Vitals:   05/27/18 1336 05/27/18 1341  BP: (!) 153/115 (!) 138/109  Pulse: 90   Resp:    Temp: 99 F (37.2 C)   SpO2: 100%     History of present illness:  33 y.o.femalewith medical history significant ofhypertension, alcohol abuse, tobacco abuse, who presents with chest pain.  Patient states that she has been having intermittent chest discomfort,chest pressure and chest pain for more than 1 month, which has worsened today. The pain is located in the right side of the chest and also substernal area, pressure-like, moderate, some timesradiating to the back.described as"Feels like a giant bubble in my chest causing pressure".It is associated with mild shortness of breathsometimes, but no cough, fever or chills. No recent long distanttraveling. No tenderness in the calf area.Patient reports acid reflux symptoms. No nausea,vomiting or diarrhea. No symptoms of UTI or unilateral weakness. Patient was seen in urgent care prior to coming to the ED and treated with GI cocktail which did not improve symptoms. She admits todrinkinga few  shots and beers "almost daily." Last alcohol was last night. Occasional THC use, but denies other illicit drug use. States she has not taken her lisinopril for at least a month.  ACS ruled out with negative troponins x3 and unremarkable twelve-lead EKG.  CTA PE negative.  Bilateral lower extremity Doppler ultrasound negative for DVT.  Hepatic steatosis incidentally noted on CT scan of the chest with and without contrast.  Hospital stay prolonged due persistent intermittent chest pain and also due to concern for alcohol withdrawal.  05/27/18: Patient seen and examined at her bedside.  States she feels better today she has no new complaints.  Denies chest pain, palpitations, or dyspnea.  Lab studies and vital signs reviewed and are stable.  On the day of discharge, the patient was hemodynamically stable.  She will need to follow-up with her primary care provider posthospitalization.  Patient advised to completely abstain from alcohol use.  Patient understands and agrees to plan.  Hospital Course:  Principal Problem:   Chest pain Active Problems:   HTN (hypertension)   Hypokalemia   Lactic acidosis   Tobacco abuse   Alcohol abuse   Abnormal LFTs  Chest pain, ACS ruled out On presentation twelve-lead EKG unremarkable and troponin negative x3 Bilateral lower extremity Doppler ultrasound negative for DVT CTA PE negative Intermittent chest pain is thought to be GI related  Improved with Carafate and Protonix  Alcoholic gastritis Continue PPI and carafate prn  Resolved accelerated hypertension Blood pressure improved with lisinopril 20 mg daily and metoprolol 25 mg  Continue blood pressure medications  Alcohol abuse with concern for withdrawal Now out of the window for  withdrawal Stable  Hepatic steatosis in the setting of alcohol abuse Elevated LFTs Bedside counseling on alcohol cessation Follow-up with your PCP  Elevated LFTs likely secondary to alcohol abuse Hepatitis panel  negative Hepatic steatosis and CT scan  Resolved hypokalemia post repletion  Mildly elevated lipase Asymptomatic Is to completely abstain from alcohol use  Hyperlipidemia Elevated cholesterol 389 from 05/25/2018 Follow-up with your PCP  Alcohol abuse Alcohol cessation counseled on at bedside Patient advised to completely abstain from alcohol abuse     Code Status:Full  Consultants: None   Discharge Exam: BP (!) 138/109    Pulse 90    Temp 99 F (37.2 C) (Oral)    Resp 20    Ht 5\' 6"  (1.676 m)    Wt 70.5 kg    SpO2 100%    BMI 25.09 kg/m   General: 33 y.o. year-old female well developed well nourished in no acute distress.  Alert and oriented x3.  Cardiovascular: Regular rate and rhythm with no rubs or gallops.  No thyromegaly or JVD noted.    Respiratory: Clear to auscultation with no wheezes or rales. Good inspiratory effort.  Abdomen: Soft nontender nondistended with normal bowel sounds x4 quadrants.  Musculoskeletal: No lower extremity edema. 2/4 pulses in all 4 extremities.  Skin: No ulcerative lesions noted or rashes,  Psychiatry: Mood is appropriate for condition and setting  Discharge Instructions You were cared for by a hospitalist during your hospital stay. If you have any questions about your discharge medications or the care you received while you were in the hospital after you are discharged, you can call the unit and asked to speak with the hospitalist on call if the hospitalist that took care of you is not available. Once you are discharged, your primary care physician will handle any further medical issues. Please note that NO REFILLS for any discharge medications will be authorized once you are discharged, as it is imperative that you return to your primary care physician (or establish a relationship with a primary care physician if you do not have one) for your aftercare needs so that they can reassess your need for medications and monitor your lab  values.   Allergies as of 05/27/2018   No Known Allergies     Medication List    STOP taking these medications   lisinopril-hydrochlorothiazide 10-12.5 MG tablet Commonly known as:  ZESTORETIC     TAKE these medications   folic acid 1 MG tablet Commonly known as:  FOLVITE Take 1 tablet (1 mg total) by mouth daily.   lisinopril 20 MG tablet Commonly known as:  ZESTRIL Take 1 tablet (20 mg total) by mouth daily. Start taking on:  May 28, 2018   metoprolol tartrate 25 MG tablet Commonly known as:  LOPRESSOR Take 1 tablet (25 mg total) by mouth 2 (two) times daily.   nicotine 21 mg/24hr patch Commonly known as:  NICODERM CQ - dosed in mg/24 hours Place 1 patch (21 mg total) onto the skin daily. Start taking on:  May 28, 2018   pantoprazole 40 MG tablet Commonly known as:  PROTONIX Take 1 tablet (40 mg total) by mouth daily at 12 noon. Start taking on:  May 28, 2018   sucralfate 1 GM/10ML suspension Commonly known as:  CARAFATE Take 10 mLs (1 g total) by mouth 4 (four) times daily -  with meals and at bedtime.   thiamine 100 MG tablet Take 1 tablet (100 mg total) by mouth daily. Start taking  on:  May 28, 2018      No Known Allergies Follow-up Information     [facility]. Call in 1 day(s).   Why:  Please call for a post hospital follow-up appointment. Contact information: [address] [place] [address] [phone]           The results of significant diagnostics from this hospitalization (including imaging, microbiology, ancillary and laboratory) are listed below for reference.    Significant Diagnostic Studies: Dg Chest 2 View  Result Date: 05/24/2018 CLINICAL DATA:  33 year old female with a history of chest pain EXAM: CHEST - 2 VIEW COMPARISON:  Multiple prior most recent 12/20/2014 FINDINGS: Cardiomediastinal silhouette within normal limits in size and contour. No pneumothorax or pleural effusion.  No confluent airspace disease. No displaced fracture. IMPRESSION: Negative for acute cardiopulmonary disease Electronically Signed   By: Corrie Mckusick D.O.   On: 05/24/2018 14:24   Ct Angio Chest Pe W And/or Wo Contrast  Result Date: 05/24/2018 CLINICAL DATA:  Evaluate for pulmonary embolus. Positive D-dimer. Chest pain. EXAM: CT ANGIOGRAPHY CHEST WITH CONTRAST TECHNIQUE: Multidetector CT imaging of the chest was performed using the standard protocol during bolus administration of intravenous contrast. Multiplanar CT image reconstructions and MIPs were obtained to evaluate the vascular anatomy. CONTRAST:  76mL OMNIPAQUE IOHEXOL 350 MG/ML SOLN COMPARISON:  None. FINDINGS: Cardiovascular: The heart size is normal. Main pulmonary artery appears patent. No saddle embolus or central obstructing pulmonary emboli identified. No lobar or segmental pulmonary artery filling defects identified. Mediastinum/Nodes: No enlarged mediastinal, hilar, or axillary lymph nodes. Thyroid gland, trachea, and esophagus demonstrate no significant findings. Lungs/Pleura: No pleural effusion. No airspace consolidation or atelectasis. Upper Abdomen: Hepatic steatosis.  No acute abnormality. Musculoskeletal: No chest wall abnormality. No acute or significant osseous findings. Review of the MIP images confirms the above findings. IMPRESSION: 1. No evidence for acute pulmonary embolus. No acute cardiopulmonary abnormalities. 2. Suspect hepatic steatosis. 1. Electronically Signed   By: Kerby Moors M.D.   On: 05/24/2018 19:31   Vas [place] Lower Extremity Venous (dvt)  Result Date: 05/25/2018  Lower Venous Study Indications: Edema.  Performing Technologist: Toma Copier RVS  Examination Guidelines: A complete evaluation includes B-mode imaging, spectral Doppler, color Doppler, and power Doppler as needed of all accessible portions of each vessel. Bilateral testing is considered an integral part of a complete examination. Limited  examinations for reoccurring indications may be performed as noted.  +---------+---------------+---------+-----------+----------+-------+  RIGHT     Compressibility Phasicity Spontaneity Properties Summary  +---------+---------------+---------+-----------+----------+-------+  CFV       Full            Yes       Yes                             +---------+---------------+---------+-----------+----------+-------+  SFJ       Full                                                      +---------+---------------+---------+-----------+----------+-------+  FV Prox   Full            Yes       Yes                             +---------+---------------+---------+-----------+----------+-------+  FV Mid    Full                                                      +---------+---------------+---------+-----------+----------+-------+  FV Distal Full            Yes       Yes                             +---------+---------------+---------+-----------+----------+-------+  PFV       Full            Yes       Yes                             +---------+---------------+---------+-----------+----------+-------+  POP       Full            Yes       Yes                             +---------+---------------+---------+-----------+----------+-------+  PTV       Full                                                      +---------+---------------+---------+-----------+----------+-------+  PERO      Full                                                      +---------+---------------+---------+-----------+----------+-------+   +---------+---------------+---------+-----------+----------+-------+  LEFT      Compressibility Phasicity Spontaneity Properties Summary  +---------+---------------+---------+-----------+----------+-------+  CFV       Full            Yes       Yes                             +---------+---------------+---------+-----------+----------+-------+  SFJ       Full                                                       +---------+---------------+---------+-----------+----------+-------+  FV Prox   Full            Yes       Yes                             +---------+---------------+---------+-----------+----------+-------+  FV Mid    Full                                                      +---------+---------------+---------+-----------+----------+-------+  FV Distal Full            Yes       Yes                             +---------+---------------+---------+-----------+----------+-------+  PFV       Full            Yes       Yes                             +---------+---------------+---------+-----------+----------+-------+  POP       Full            Yes       Yes                             +---------+---------------+---------+-----------+----------+-------+  PTV       Full                                                      +---------+---------------+---------+-----------+----------+-------+  PERO      Full                                                      +---------+---------------+---------+-----------+----------+-------+     Summary: Right: There is no evidence of deep vein thrombosis in the lower extremity. No cystic structure found in the popliteal fossa. Left: There is no evidence of deep vein thrombosis in the lower extremity. No cystic structure found in the popliteal fossa.  *See table(s) above for measurements and observations. Electronically signed by Deitra Mayo MD on 05/25/2018 at 7:01:49 PM.    Final     Microbiology: Recent Results (from the past 240 hour(s))  SARS Coronavirus 2 (CEPHEID- Performed in [facility] hospital lab), Hosp Order     Status: None   Collection Time: 05/24/18  5:08 PM  Result Value Ref Range Status   SARS Coronavirus 2 NEGATIVE NEGATIVE Final    Comment: (NOTE) If result is NEGATIVE SARS-CoV-2 target nucleic acids are NOT DETECTED. The SARS-CoV-2 RNA is generally detectable in upper and lower  respiratory specimens during the acute phase of infection. The lowest    concentration of SARS-CoV-2 viral copies this assay can detect is 250  copies / mL. A negative result does not preclude SARS-CoV-2 infection  and should not be used as the sole basis for treatment or other  patient management decisions.  A negative result may occur with  improper specimen collection / handling, submission of specimen other  than nasopharyngeal swab, presence of viral mutation(s) within the  areas targeted by this assay, and inadequate number of viral copies  (<250 copies / mL). A negative result must be combined with clinical  observations, patient history, and epidemiological information. If result is POSITIVE SARS-CoV-2 target nucleic acids are DETECTED. The SARS-CoV-2 RNA is generally detectable in upper and lower  respiratory specimens dur ing the acute phase of infection.  Positive  results are indicative of active infection with SARS-CoV-2.  Clinical  correlation with patient  history and other diagnostic information is  necessary to determine patient infection status.  Positive results do  not rule out bacterial infection or co-infection with other viruses. If result is PRESUMPTIVE POSTIVE SARS-CoV-2 nucleic acids MAY BE PRESENT.   A presumptive positive result was obtained on the submitted specimen  and confirmed on repeat testing.  While 2019 novel coronavirus  (SARS-CoV-2) nucleic acids may be present in the submitted sample  additional confirmatory testing may be necessary for epidemiological  and / or clinical management purposes  to differentiate between  SARS-CoV-2 and other Sarbecovirus currently known to infect humans.  If clinically indicated additional testing with an alternate test  methodology [phone]) is advised. The SARS-CoV-2 RNA is generally  detectable in upper and lower respiratory sp ecimens during the acute  phase of infection. The expected result is Negative. Fact Sheet for Patients:  StrictlyIdeas.no Fact Sheet  for Healthcare Providers: BankingDealers.co.za This test is not yet approved or cleared by the [place] FDA and has been authorized for detection and/or diagnosis of SARS-CoV-2 by FDA under an Emergency Use Authorization (EUA).  This EUA will remain in effect (meaning this test can be used) for the duration of the COVID-19 declaration under Section 564(b)(1) of the Act, 21 U.S.C. section 360bbb-3(b)(1), unless the authorization is terminated or revoked sooner. Performed at [facility], [place] [address] [place], [place] [address]      Labs: Basic Metabolic Panel: Recent Labs  Lab 05/24/18 1333 05/24/18 1659 05/26/18 0355 05/27/18 0452  NA 141  --  135 137  K 3.0*  --  3.4* 3.7  CL 92*  --  95* 99  CO2 23  --  27 26  GLUCOSE 139*  --  100* 103*  BUN 5*  --  <5* <5*  CREATININE 0.83  --  0.68 0.69  CALCIUM 9.1  --  8.6* 9.3  MG  --  1.8  --   --    Liver Function Tests: Recent Labs  Lab 05/24/18 1655 05/26/18 0355 05/27/18 0452  AST 329* 328* 388*  ALT 92* 85* 112*  ALKPHOS 101 98 94  BILITOT 1.7* 2.4* 2.3*  PROT 8.7* 7.7 7.9  ALBUMIN 5.2* 4.4 4.5   Recent Labs  Lab 05/24/18 1655 05/27/18 0452  LIPASE 73* 75*   No results for input(s): AMMONIA in the last 168 hours. CBC: Recent Labs  Lab 05/24/18 1333 05/27/18 0452  WBC 4.3   4.3 4.4  NEUTROABS 3.2  --   HGB 12.4   12.3 12.4  HCT 34.7*   34.1* 35.6*  MCV 100.6*   100.3* 104.4*  PLT 229   225 122*   Cardiac Enzymes: Recent Labs  Lab 05/24/18 1333 05/24/18 2010 05/25/18 0403 05/25/18 0755  TROPONINI <0.03 <0.03 <0.03 <0.03   BNP: BNP (last 3 results) No results for input(s): BNP in the last 8760 hours.  ProBNP (last 3 results) No results for input(s): PROBNP in the last 8760 hours.  CBG: No results for input(s): GLUCAP in the last 168 hours.     Signed:  Kayleen Memos, MD [facility] 05/27/2018, 2:43 PM

## 2018-05-27 NOTE — Discharge Instructions (Signed)

## 2018-05-27 NOTE — Progress Notes (Signed)
Pt discharging to home, instructions, follow up information and MyChart education completed and pt verbalizes understanding

## 2018-07-13 NOTE — Progress Notes (Signed)
Patient ID: Yolanda Flores, female   DOB: 10-23-1985, 33 y.o.   MRN: [id number]   Virtual Visit via Telephone Note  I connected with Yolanda Flores on 07/14/18 at  2:30 PM EDT by telephone and verified that I am speaking with the correct person using two identifiers.   I discussed the limitations, risks, security and privacy concerns of performing an evaluation and management service by telephone and the availability of in person appointments. I also discussed with the patient that there may be a patient responsible charge related to this service. The patient expressed understanding and agreed to proceed.  Patient location:  work My Location:  [place] office Persons on the call:  Myself and the patient  History of Present Illness: After hospitalization 5/11-5/14/2020 for CP that was believed to be related GI issues.  She is drinking alcohol again but much less-usually 1 time per week and only a "couple." no further pain except if she misses a dose of carafate. Needs RF on meds.  No FH early cardiac events  From discharge summary: History of present illness:  33 y.o.femalewith medical history significant ofhypertension, alcohol abuse, tobacco abuse, who presents with chest pain.  Patient states that she has been having intermittent chest discomfort,chest pressure and chest pain for more than 1 month, which has worsened today. The pain is located in the right side of the chest and also substernal area, pressure-like, moderate, some timesradiating to the back.described as"Feels like a giant bubble in my chest causing pressure".It is associated with mild shortness of breathsometimes, but no cough, fever or chills. No recent long distanttraveling. No tenderness in the calf area.Patient reports acid reflux symptoms. No nausea,vomiting or diarrhea. No symptoms of UTI or unilateral weakness. Patient was seen in urgent care prior to coming to the ED and treated with GI cocktail which did  not improve symptoms. She admits todrinkinga few shots and beers "almost daily." Last alcohol was last night. Occasional THC use, but denies other illicit drug use. States she has not taken her lisinopril for at least a month.  ACS ruled out with negative troponins x3 and unremarkable twelve-lead EKG.  CTA PE negative.  Bilateral lower extremity Doppler ultrasound negative for DVT.  Hepatic steatosis incidentally noted on CT scan of the chest with and without contrast.  Hospital stay prolonged due persistent intermittent chest pain and also due to concern for alcohol withdrawal.  05/27/18: Patient seen and examined at her bedside.  States she feels better today she has no new complaints.  Denies chest pain, palpitations, or dyspnea.  Lab studies and vital signs reviewed and are stable.  On the day of discharge, the patient was hemodynamically stable.  She will need to follow-up with her primary care provider posthospitalization.  Patient advised to completely abstain from alcohol use.  Patient understands and agrees to plan.  Hospital Course:  Principal Problem:   Chest pain Active Problems:   HTN (hypertension)   Hypokalemia   Lactic acidosis   Tobacco abuse   Alcohol abuse   Abnormal LFTs  Chest pain, ACS ruled out On presentation twelve-lead EKG unremarkable and troponin negative x3 Bilateral lower extremity Doppler ultrasound negative for DVT CTA PE negative Intermittent chest pain is thought to be GI related  Improved with Carafate and Protonix  Alcoholic gastritis Continue PPI and carafate prn  Resolved accelerated hypertension Blood pressure improved with lisinopril 20 mg daily and metoprolol 25 mg  Continue blood pressure medications  Alcohol abuse with concern for  withdrawal Now out of the window for withdrawal Stable  Hepatic steatosis in the setting of alcohol abuse Elevated LFTs Bedside counseling on alcohol cessation Follow-up with your PCP  Elevated  LFTs likely secondary to alcohol abuse Hepatitis panel negative Hepatic steatosis and CT scan  Resolved hypokalemia post repletion  Mildly elevated lipase Asymptomatic Is to completely abstain from alcohol use  Hyperlipidemia Elevated cholesterol 389 from 05/25/2018 Follow-up with your PCP  Alcohol abuse Alcohol cessation counseled on at bedside Patient advised to completely abstain from alcohol abuse     Observations/Objective:  A&Ox3   Assessment and Plan: 1. Essential hypertension continue - metoprolol tartrate (LOPRESSOR) 25 MG tablet; Take 1 tablet (25 mg total) by mouth 2 (two) times daily.  Dispense: 180 tablet; Refill: 1 - lisinopril (ZESTRIL) 20 MG tablet; Take 1 tablet (20 mg total) by mouth daily.  Dispense: 90 tablet; Refill: 1  2. Abnormal LFTs Eliminate alcohol and tylenol containing products - Comprehensive metabolic panel; Future - Lipase; Future  3. Hypokalemia - Comprehensive metabolic panel; Future  4. Gastroesophageal reflux disease with esophagitis - sucralfate (CARAFATE) 1 GM/10ML suspension; Take 10 mLs (1 g total) by mouth 4 (four) times daily -  with meals and at bedtime.  Dispense: 420 mL; Refill: 2 - Comprehensive metabolic panel; Future - H. pylori breath test; Future - pantoprazole (PROTONIX) 40 MG tablet; Take 1 tablet (40 mg total) by mouth daily at 12 noon.  Dispense: 90 tablet; Refill: 0  5. Other hyperlipidemia - Lipid panel; Future    Follow Up Instructions: Establish care in 1 month   I discussed the assessment and treatment plan with the patient. The patient was provided an opportunity to ask questions and all were answered. The patient agreed with the plan and demonstrated an understanding of the instructions.   The patient was advised to call back or seek an in-person evaluation if the symptoms worsen or if the condition fails to improve as anticipated.  I provided 14 minutes of non-face-to-face time during this  encounter.   Freeman Caldron, PA-C

## 2018-07-14 ENCOUNTER — Ambulatory Visit: Payer: BLUE CROSS/BLUE SHIELD | Attending: Family Medicine | Admitting: Physician Assistant

## 2018-07-14 ENCOUNTER — Other Ambulatory Visit: Payer: Self-pay

## 2018-07-14 DIAGNOSIS — K2100 Gastro-esophageal reflux disease with esophagitis, without bleeding: Secondary | ICD-10-CM

## 2018-07-14 DIAGNOSIS — I10 Essential (primary) hypertension: Secondary | ICD-10-CM | POA: Diagnosis not present

## 2018-07-14 DIAGNOSIS — E876 Hypokalemia: Secondary | ICD-10-CM | POA: Diagnosis not present

## 2018-07-14 DIAGNOSIS — R945 Abnormal results of liver function studies: Secondary | ICD-10-CM

## 2018-07-14 DIAGNOSIS — R7989 Other specified abnormal findings of blood chemistry: Secondary | ICD-10-CM

## 2018-07-14 DIAGNOSIS — E7849 Other hyperlipidemia: Secondary | ICD-10-CM

## 2018-07-14 MED ORDER — PANTOPRAZOLE SODIUM 40 MG PO TBEC: 40.0000 mg | DELAYED_RELEASE_TABLET | Freq: Every day | ORAL | 0 refills | Status: DC

## 2018-07-14 MED ORDER — SUCRALFATE 1 GM/10ML PO SUSP: 1.0000 g | Freq: Three times a day (TID) | ORAL | 2 refills | Status: DC

## 2018-07-14 MED ORDER — LISINOPRIL 20 MG PO TABS: 20.0000 mg | ORAL_TABLET | Freq: Every day | ORAL | 1 refills | Status: DC

## 2018-07-14 MED ORDER — METOPROLOL TARTRATE 25 MG PO TABS: 25.0000 mg | ORAL_TABLET | Freq: Two times a day (BID) | ORAL | 1 refills | Status: DC

## 2018-07-14 NOTE — Progress Notes (Signed)
Patient verified DOB Patient has taken medication. Patient has not eaten today. Patient denies pain. Patient needs refills.

## 2018-07-15 ENCOUNTER — Other Ambulatory Visit: Payer: Self-pay | Admitting: Physician Assistant

## 2018-07-15 DIAGNOSIS — K2100 Gastro-esophageal reflux disease with esophagitis, without bleeding: Secondary | ICD-10-CM

## 2018-08-02 ENCOUNTER — Other Ambulatory Visit: Payer: BLUE CROSS/BLUE SHIELD

## 2018-08-16 ENCOUNTER — Ambulatory Visit: Payer: BLUE CROSS/BLUE SHIELD | Admitting: Internal Medicine

## 2019-01-04 ENCOUNTER — Inpatient Hospital Stay (HOSPITAL_COMMUNITY)
Admission: EM | Admit: 2019-01-04 | Discharge: 2019-01-18 | DRG: 444 | Disposition: A | Discharge status: Home / Self Care | Payer: BLUE CROSS/BLUE SHIELD | Attending: Internal Medicine | Admitting: Internal Medicine

## 2019-01-04 ENCOUNTER — Encounter (HOSPITAL_COMMUNITY): Payer: Self-pay | Admitting: Internal Medicine

## 2019-01-04 ENCOUNTER — Other Ambulatory Visit: Payer: Self-pay

## 2019-01-04 ENCOUNTER — Inpatient Hospital Stay (HOSPITAL_COMMUNITY): Payer: BLUE CROSS/BLUE SHIELD

## 2019-01-04 DIAGNOSIS — F1011 Alcohol abuse, in remission: Secondary | ICD-10-CM | POA: Diagnosis present

## 2019-01-04 DIAGNOSIS — E860 Dehydration: Secondary | ICD-10-CM | POA: Diagnosis present

## 2019-01-04 DIAGNOSIS — R7989 Other specified abnormal findings of blood chemistry: Secondary | ICD-10-CM | POA: Diagnosis present

## 2019-01-04 DIAGNOSIS — R12 Heartburn: Secondary | ICD-10-CM

## 2019-01-04 DIAGNOSIS — D473 Essential (hemorrhagic) thrombocythemia: Secondary | ICD-10-CM | POA: Diagnosis not present

## 2019-01-04 DIAGNOSIS — Z09 Encounter for follow-up examination after completed treatment for conditions other than malignant neoplasm: Secondary | ICD-10-CM

## 2019-01-04 DIAGNOSIS — R579 Shock, unspecified: Secondary | ICD-10-CM | POA: Diagnosis present

## 2019-01-04 DIAGNOSIS — E781 Pure hyperglyceridemia: Secondary | ICD-10-CM | POA: Diagnosis present

## 2019-01-04 DIAGNOSIS — D7589 Other specified diseases of blood and blood-forming organs: Secondary | ICD-10-CM | POA: Diagnosis present

## 2019-01-04 DIAGNOSIS — E872 Acidosis, unspecified: Secondary | ICD-10-CM | POA: Diagnosis present

## 2019-01-04 DIAGNOSIS — F1010 Alcohol abuse, uncomplicated: Secondary | ICD-10-CM | POA: Diagnosis not present

## 2019-01-04 DIAGNOSIS — R945 Abnormal results of liver function studies: Secondary | ICD-10-CM | POA: Diagnosis present

## 2019-01-04 DIAGNOSIS — K219 Gastro-esophageal reflux disease without esophagitis: Secondary | ICD-10-CM | POA: Diagnosis present

## 2019-01-04 DIAGNOSIS — R040 Epistaxis: Secondary | ICD-10-CM | POA: Diagnosis present

## 2019-01-04 DIAGNOSIS — Z20822 Contact with and (suspected) exposure to covid-19: Secondary | ICD-10-CM | POA: Diagnosis present

## 2019-01-04 DIAGNOSIS — K7011 Alcoholic hepatitis with ascites: Secondary | ICD-10-CM | POA: Diagnosis not present

## 2019-01-04 DIAGNOSIS — R0602 Shortness of breath: Secondary | ICD-10-CM

## 2019-01-04 DIAGNOSIS — T510X1A Toxic effect of ethanol, accidental (unintentional), initial encounter: Secondary | ICD-10-CM | POA: Diagnosis present

## 2019-01-04 DIAGNOSIS — I10 Essential (primary) hypertension: Secondary | ICD-10-CM | POA: Diagnosis present

## 2019-01-04 DIAGNOSIS — J90 Pleural effusion, not elsewhere classified: Secondary | ICD-10-CM | POA: Diagnosis present

## 2019-01-04 DIAGNOSIS — N170 Acute kidney failure with tubular necrosis: Secondary | ICD-10-CM | POA: Diagnosis not present

## 2019-01-04 DIAGNOSIS — N179 Acute kidney failure, unspecified: Secondary | ICD-10-CM

## 2019-01-04 DIAGNOSIS — Z8249 Family history of ischemic heart disease and other diseases of the circulatory system: Secondary | ICD-10-CM

## 2019-01-04 DIAGNOSIS — Z87891 Personal history of nicotine dependence: Secondary | ICD-10-CM

## 2019-01-04 DIAGNOSIS — K859 Acute pancreatitis without necrosis or infection, unspecified: Secondary | ICD-10-CM | POA: Diagnosis present

## 2019-01-04 DIAGNOSIS — K8521 Alcohol induced acute pancreatitis with uninfected necrosis: Secondary | ICD-10-CM | POA: Diagnosis not present

## 2019-01-04 DIAGNOSIS — E876 Hypokalemia: Secondary | ICD-10-CM | POA: Diagnosis present

## 2019-01-04 DIAGNOSIS — E663 Overweight: Secondary | ICD-10-CM | POA: Diagnosis present

## 2019-01-04 DIAGNOSIS — K2210 Ulcer of esophagus without bleeding: Secondary | ICD-10-CM | POA: Diagnosis present

## 2019-01-04 DIAGNOSIS — E1159 Type 2 diabetes mellitus with other circulatory complications: Secondary | ICD-10-CM | POA: Diagnosis present

## 2019-01-04 DIAGNOSIS — K852 Alcohol induced acute pancreatitis without necrosis or infection: Secondary | ICD-10-CM | POA: Diagnosis not present

## 2019-01-04 DIAGNOSIS — K851 Biliary acute pancreatitis without necrosis or infection: Secondary | ICD-10-CM | POA: Diagnosis not present

## 2019-01-04 DIAGNOSIS — R6510 Systemic inflammatory response syndrome (SIRS) of non-infectious origin without acute organ dysfunction: Secondary | ICD-10-CM | POA: Diagnosis present

## 2019-01-04 DIAGNOSIS — E871 Hypo-osmolality and hyponatremia: Secondary | ICD-10-CM | POA: Diagnosis present

## 2019-01-04 DIAGNOSIS — D509 Iron deficiency anemia, unspecified: Secondary | ICD-10-CM | POA: Diagnosis present

## 2019-01-04 DIAGNOSIS — K2100 Gastro-esophageal reflux disease with esophagitis, without bleeding: Secondary | ICD-10-CM

## 2019-01-04 DIAGNOSIS — K7200 Acute and subacute hepatic failure without coma: Secondary | ICD-10-CM | POA: Diagnosis present

## 2019-01-04 DIAGNOSIS — D75839 Thrombocytosis, unspecified: Secondary | ICD-10-CM | POA: Diagnosis not present

## 2019-01-04 DIAGNOSIS — K8050 Calculus of bile duct without cholangitis or cholecystitis without obstruction: Principal | ICD-10-CM | POA: Diagnosis present

## 2019-01-04 DIAGNOSIS — D649 Anemia, unspecified: Secondary | ICD-10-CM | POA: Diagnosis not present

## 2019-01-04 DIAGNOSIS — Z6827 Body mass index (BMI) 27.0-27.9, adult: Secondary | ICD-10-CM

## 2019-01-04 DIAGNOSIS — R1310 Dysphagia, unspecified: Secondary | ICD-10-CM

## 2019-01-04 DIAGNOSIS — E8770 Fluid overload, unspecified: Secondary | ICD-10-CM | POA: Diagnosis present

## 2019-01-04 DIAGNOSIS — R0600 Dyspnea, unspecified: Secondary | ICD-10-CM

## 2019-01-04 DIAGNOSIS — D6489 Other specified anemias: Secondary | ICD-10-CM | POA: Diagnosis present

## 2019-01-04 DIAGNOSIS — K760 Fatty (change of) liver, not elsewhere classified: Secondary | ICD-10-CM | POA: Diagnosis present

## 2019-01-04 DIAGNOSIS — R7401 Elevation of levels of liver transaminase levels: Secondary | ICD-10-CM | POA: Diagnosis not present

## 2019-01-04 LAB — CBC
HCT: 35.1 % — ABNORMAL LOW (ref 36.0–46.0)
Hemoglobin: 12.3 g/dL (ref 12.0–15.0)
MCH: 33.7 pg (ref 26.0–34.0)
MCHC: 35 g/dL (ref 30.0–36.0)
MCV: 96.2 fL (ref 80.0–100.0)
Platelets: 180 10*3/uL (ref 150–400)
RBC: 3.65 MIL/uL — ABNORMAL LOW (ref 3.87–5.11)
RDW: 14.1 % (ref 11.5–15.5)
WBC: 8.3 10*3/uL (ref 4.0–10.5)
nRBC: 0 % (ref 0.0–0.2)

## 2019-01-04 LAB — I-STAT BETA HCG BLOOD, ED (MC, WL, AP ONLY): I-stat hCG, quantitative: <5 m[IU]/mL (ref <5)

## 2019-01-04 LAB — CBC WITH DIFFERENTIAL/PLATELET
Abs Immature Granulocytes: 0.09 10*3/uL — ABNORMAL HIGH (ref 0.00–0.07)
Basophils Absolute: 0 10*3/uL (ref 0.0–0.1)
Basophils Relative: 0 %
Eosinophils Absolute: 0 10*3/uL (ref 0.0–0.5)
Eosinophils Relative: 0 %
HCT: 42 % (ref 36.0–46.0)
Hemoglobin: 14.6 g/dL (ref 12.0–15.0)
Immature Granulocytes: 1 %
Lymphocytes Relative: 2 %
Lymphs Abs: 0.3 10*3/uL — ABNORMAL LOW (ref 0.7–4.0)
MCH: 33.1 pg (ref 26.0–34.0)
MCHC: 34.8 g/dL (ref 30.0–36.0)
MCV: 95.2 fL (ref 80.0–100.0)
Monocytes Absolute: 0.4 10*3/uL (ref 0.1–1.0)
Monocytes Relative: 2 %
Neutro Abs: 15.6 10*3/uL — ABNORMAL HIGH (ref 1.7–7.7)
Neutrophils Relative %: 95 %
Platelets: 333 10*3/uL (ref 150–400)
RBC: 4.41 MIL/uL (ref 3.87–5.11)
RDW: 14.2 % (ref 11.5–15.5)
WBC: 16.4 10*3/uL — ABNORMAL HIGH (ref 4.0–10.5)
nRBC: 0 % (ref 0.0–0.2)

## 2019-01-04 LAB — COMPREHENSIVE METABOLIC PANEL
ALT: 285 U/L — ABNORMAL HIGH (ref 0–44)
AST: 1669 U/L — ABNORMAL HIGH (ref 15–41)
Albumin: 3.8 g/dL (ref 3.5–5.0)
Alkaline Phosphatase: 74 U/L (ref 38–126)
Anion gap: 20 — ABNORMAL HIGH (ref 5–15)
BUN: 36 mg/dL — ABNORMAL HIGH (ref 6–20)
CO2: 19 mmol/L — ABNORMAL LOW (ref 22–32)
Calcium: 7.1 mg/dL — ABNORMAL LOW (ref 8.9–10.3)
Chloride: 92 mmol/L — ABNORMAL LOW (ref 98–111)
Creatinine, Ser: 3.3 mg/dL — ABNORMAL HIGH (ref 0.44–1.00)
GFR calc Af Amer: 21 mL/min — ABNORMAL LOW (ref >60)
GFR calc non Af Amer: 18 mL/min — ABNORMAL LOW (ref >60)
Glucose, Bld: 139 mg/dL — ABNORMAL HIGH (ref 70–99)
Potassium: 4.4 mmol/L (ref 3.5–5.1)
Sodium: 131 mmol/L — ABNORMAL LOW (ref 135–145)
Total Bilirubin: 2.6 mg/dL — ABNORMAL HIGH (ref 0.3–1.2)
Total Protein: 7.1 g/dL (ref 6.5–8.1)

## 2019-01-04 LAB — LIPASE, BLOOD: Lipase: 4101 U/L — ABNORMAL HIGH (ref 11–51)

## 2019-01-04 LAB — RESPIRATORY PANEL BY RT PCR (FLU A&B, COVID)
Influenza A by PCR: NEGATIVE
Influenza B by PCR: NEGATIVE
SARS Coronavirus 2 by RT PCR: NEGATIVE

## 2019-01-04 LAB — LACTIC ACID, PLASMA: Lactic Acid, Venous: 2.4 mmol/L (ref 0.5–1.9)

## 2019-01-04 MED ORDER — SODIUM CHLORIDE 0.9 % IV SOLN: INTRAVENOUS | Status: DC

## 2019-01-04 MED ORDER — ACETAMINOPHEN 325 MG PO TABS: 650.0000 mg | ORAL_TABLET | Freq: Four times a day (QID) | ORAL | Status: DC | PRN

## 2019-01-04 MED ORDER — LORAZEPAM 2 MG/ML IJ SOLN: 1.0000 mg | INTRAMUSCULAR | Status: AC | PRN

## 2019-01-04 MED ORDER — ONDANSETRON HCL 4 MG PO TABS
4.0000 mg | ORAL_TABLET | Freq: Four times a day (QID) | ORAL | Status: DC | PRN
  Administered 2019-01-16 (×2): 4 mg via ORAL
  Filled 2019-01-04 (×2): qty 1

## 2019-01-04 MED ORDER — HEPARIN SODIUM (PORCINE) 5000 UNIT/ML IJ SOLN
5000.0000 [IU] | Freq: Three times a day (TID) | INTRAMUSCULAR | Status: DC
  Administered 2019-01-04 – 2019-01-18 (×39): 5000 [IU] via SUBCUTANEOUS
  Filled 2019-01-04 (×39): qty 1

## 2019-01-04 MED ORDER — POLYETHYLENE GLYCOL 3350 17 G PO PACK: 17.0000 g | PACK | Freq: Every day | ORAL | Status: DC | PRN

## 2019-01-04 MED ORDER — THIAMINE HCL 100 MG/ML IJ SOLN
100.0000 mg | Freq: Every day | INTRAMUSCULAR | Status: DC
  Administered 2019-01-04 – 2019-01-10 (×2): 100 mg via INTRAVENOUS
  Filled 2019-01-04 (×4): qty 2

## 2019-01-04 MED ORDER — PANTOPRAZOLE SODIUM 40 MG PO TBEC
40.0000 mg | DELAYED_RELEASE_TABLET | Freq: Every day | ORAL | Status: DC
  Administered 2019-01-05 – 2019-01-08 (×4): 40 mg via ORAL
  Filled 2019-01-04 (×4): qty 1

## 2019-01-04 MED ORDER — LORAZEPAM 1 MG PO TABS: 1.0000 mg | ORAL_TABLET | ORAL | Status: AC | PRN

## 2019-01-04 MED ORDER — FENTANYL CITRATE (PF) 100 MCG/2ML IJ SOLN
50.0000 ug | Freq: Once | INTRAMUSCULAR | Status: AC
  Administered 2019-01-04: 50 ug via INTRAVENOUS
  Filled 2019-01-04: qty 2

## 2019-01-04 MED ORDER — ONDANSETRON HCL 4 MG/2ML IJ SOLN
4.0000 mg | Freq: Four times a day (QID) | INTRAMUSCULAR | Status: DC | PRN
  Administered 2019-01-04 – 2019-01-17 (×7): 4 mg via INTRAVENOUS
  Filled 2019-01-04 (×7): qty 2

## 2019-01-04 MED ORDER — SODIUM CHLORIDE 0.9 % IV BOLUS
2000.0000 mL | Freq: Once | INTRAVENOUS | Status: AC
  Administered 2019-01-04: 10:00:00 2000 mL via INTRAVENOUS

## 2019-01-04 MED ORDER — THIAMINE HCL 100 MG PO TABS
100.0000 mg | ORAL_TABLET | Freq: Every day | ORAL | Status: DC
  Administered 2019-01-05 – 2019-01-18 (×13): 100 mg via ORAL
  Filled 2019-01-04 (×14): qty 1

## 2019-01-04 MED ORDER — METOPROLOL TARTRATE 25 MG PO TABS
25.0000 mg | ORAL_TABLET | Freq: Two times a day (BID) | ORAL | Status: DC
  Administered 2019-01-04 – 2019-01-06 (×4): 25 mg via ORAL
  Filled 2019-01-04 (×4): qty 1

## 2019-01-04 MED ORDER — LORAZEPAM 2 MG/ML IJ SOLN: 0.0000 mg | Freq: Four times a day (QID) | INTRAMUSCULAR | Status: AC

## 2019-01-04 MED ORDER — ADULT MULTIVITAMIN W/MINERALS CH
1.0000 | ORAL_TABLET | Freq: Every day | ORAL | Status: DC
  Administered 2019-01-04 – 2019-01-18 (×15): 1 via ORAL
  Filled 2019-01-04 (×15): qty 1

## 2019-01-04 MED ORDER — LORAZEPAM 2 MG/ML IJ SOLN: 0.0000 mg | Freq: Two times a day (BID) | INTRAMUSCULAR | Status: AC

## 2019-01-04 MED ORDER — MORPHINE SULFATE (PF) 4 MG/ML IV SOLN
6.0000 mg | Freq: Once | INTRAVENOUS | Status: AC
  Administered 2019-01-04: 10:00:00 6 mg via INTRAVENOUS
  Filled 2019-01-04: qty 2

## 2019-01-04 MED ORDER — THIAMINE HCL 100 MG PO TABS
100.0000 mg | ORAL_TABLET | Freq: Every day | ORAL | Status: DC
  Administered 2019-01-04: 19:00:00 100 mg via ORAL

## 2019-01-04 MED ORDER — OXYCODONE HCL 5 MG PO TABS
5.0000 mg | ORAL_TABLET | ORAL | Status: DC | PRN
  Administered 2019-01-04 – 2019-01-18 (×25): 5 mg via ORAL
  Filled 2019-01-04 (×25): qty 1

## 2019-01-04 MED ORDER — FOLIC ACID 1 MG PO TABS
1.0000 mg | ORAL_TABLET | Freq: Every day | ORAL | Status: DC
  Administered 2019-01-04 – 2019-01-18 (×15): 1 mg via ORAL
  Filled 2019-01-04 (×15): qty 1

## 2019-01-04 MED ORDER — BISACODYL 10 MG RE SUPP: 10.0000 mg | Freq: Every day | RECTAL | Status: DC | PRN

## 2019-01-04 MED ORDER — ACETAMINOPHEN 650 MG RE SUPP: 650.0000 mg | Freq: Four times a day (QID) | RECTAL | Status: DC | PRN

## 2019-01-04 MED ORDER — SODIUM CHLORIDE 0.9 % IV BOLUS
1000.0000 mL | Freq: Once | INTRAVENOUS | Status: AC
  Administered 2019-01-04: 1000 mL via INTRAVENOUS

## 2019-01-04 MED ORDER — SODIUM CHLORIDE 0.9 % IV BOLUS
1000.0000 mL | Freq: Once | INTRAVENOUS | Status: AC
  Administered 2019-01-04: 13:00:00 1000 mL via INTRAVENOUS

## 2019-01-04 NOTE — H&P (Signed)
History and Physical    Yolanda Flores [id number] DOB: [birth year] DOA: 01/04/2019  PCP: Patient, No Pcp Per   Patient coming from: Home  Chief Complaint: Nausea and Vomiting; Abdominal Pain  HPI: Yolanda Flores is a 32 y.o. female with medical history for hypertension, GERD, alcohol abuse who presents with chief complaint of nausea vomiting abdominal pain that started Sunday.  Patient states that she was drinking on Sunday had a couple shots and subsequently she developed insidious abdominal pain she states the pain has been constant since Sunday and has been laying in bed due to the pain and is not able to sleep.  She states it hurts in the left upper quadrant and mid abdomen but does not radiate to the back.  Never had this type of pain before and states that she has been drinking shots previously.  Has been taking Protonix for epigastric pain and she also endorses chest discomfort while swallowing.  Because of these issues she came to the emergency room for further evaluation and she was found to have acute pancreatitis.  TRH was asked to admit this patient for acute pancreatitis and gastroenterology was consulted for further evaluation recommendations  ED Course: In the ED she had basic blood work and was given pain control and given 2 L of saline boluses and placed on maintenance IV fluid.  Gastroenterology was consulted for further evaluation recommendations.  Of note SARS-CoV-2 testing was negative  Review of Systems: As per HPI otherwise all other systems reviewed and negative.   Past Medical History:  Diagnosis Date  . Hypertension    Past Surgical History:  Procedure Laterality Date  . FOOT SURGERY     SOCIAL HISTORY  reports that she quit smoking about 7 months ago. Her smoking use included cigarettes. She smoked 4.00 packs per day. She has never used smokeless tobacco. She reports current alcohol use. She reports that she does not use drugs.  ALLERGIES No Known  Allergies  Family History  Problem Relation Age of Onset  . Hypertension Mother   . Hypertension Father   . Hypertension Brother    Prior to Admission medications   Medication Sig Start Date End Date Taking? Authorizing Provider  lisinopril (ZESTRIL) 20 MG tablet Take 1 tablet (20 mg total) by mouth daily. Patient not taking: Reported on 01/04/2019 07/14/18   Argentina Donovan, PA-C  metoprolol tartrate (LOPRESSOR) 25 MG tablet Take 1 tablet (25 mg total) by mouth 2 (two) times daily. Patient not taking: Reported on 01/04/2019 07/14/18   Argentina Donovan, PA-C  pantoprazole (PROTONIX) 40 MG tablet Take 1 tablet (40 mg total) by mouth daily at 12 noon. Patient not taking: Reported on 01/04/2019 07/14/18   Argentina Donovan, PA-C  sucralfate (CARAFATE) 1 GM/10ML suspension Take 10 mLs (1 g total) by mouth 4 (four) times daily -  with meals and at bedtime. Patient not taking: Reported on 01/04/2019 07/14/18   Argentina Donovan, PA-C  thiamine 100 MG tablet Take 1 tablet (100 mg total) by mouth daily. Patient not taking: Reported on 01/04/2019 05/28/18   Kayleen Memos, DO   Physical Exam: Vitals:   01/04/19 1445 01/04/19 1500 01/04/19 1530 01/04/19 1600  BP:  (!) 163/126 (!) 146/110 (!) 130/93  Pulse: (!) 122  (!) 116 (!) 115  Resp: (!) 25 20 20  (!) 21  Temp:      TempSrc:      SpO2: 100%  100% 99%  Weight:  Height:       Constitutional: WN/WD overweight female currently in mild distress and appears uncomfortable Eyes: Lids and conjunctivae normal, sclerae anicteric  ENMT: External Ears, Nose appear normal. Grossly normal hearing. Mucous membranes are dry Neck: Appears normal, supple, no cervical masses, normal ROM, no appreciable thyromegaly Respiratory: Diminished to auscultation bilaterally, no wheezing, rales, rhonchi or crackles. She is tachypenic  Cardiovascular: Tachycardic rate but regular rhythm, no murmurs / rubs / gallops. S1 and S2 auscultated.  Abdomen: Soft,Tender to  palpate. Distended. Bowel sounds positive x4.  GU: Deferred. Musculoskeletal: No clubbing / cyanosis of digits/nails. No joint deformity upper and lower extremities.  Skin: No rashes, lesions, ulcers on a limited skin evaluation. No induration; Warm and dry.  Neurologic: CN 2-12 grossly intact with no focal deficits. Romberg sign cerebellar reflexes not assessed.  Psychiatric: Normal judgment and insight. Alert and oriented x 3. Normal mood and appropriate affect.   Labs on Admission: I have personally reviewed following labs and imaging studies  CBC: Recent Labs  Lab 01/04/19 0943  WBC 16.4*  NEUTROABS 15.6*  HGB 14.6  HCT 42.0  MCV 95.2  PLT 0000000   Basic Metabolic Panel: Recent Labs  Lab 01/04/19 1041  NA 131*  K 4.4  CL 92*  CO2 19*  GLUCOSE 139*  BUN 36*  CREATININE 3.30*  CALCIUM 7.1*   GFR: Estimated Creatinine Clearance: 24 mL/min (A) (by C-G formula based on SCr of 3.3 mg/dL (H)). Liver Function Tests: Recent Labs  Lab 01/04/19 1041  AST 1,669*  ALT 285*  ALKPHOS 74  BILITOT 2.6*  PROT 7.1  ALBUMIN 3.8   Recent Labs  Lab 01/04/19 1041  LIPASE 4,101*   No results for input(s): AMMONIA in the last 168 hours. Coagulation Profile: No results for input(s): INR, PROTIME in the last 168 hours. Cardiac Enzymes: No results for input(s): CKTOTAL, CKMB, CKMBINDEX, TROPONINI in the last 168 hours. BNP (last 3 results) No results for input(s): PROBNP in the last 8760 hours. HbA1C: No results for input(s): HGBA1C in the last 72 hours. CBG: No results for input(s): GLUCAP in the last 168 hours. Lipid Profile: No results for input(s): CHOL, HDL, LDLCALC, TRIG, CHOLHDL, LDLDIRECT in the last 72 hours. Thyroid Function Tests: No results for input(s): TSH, T4TOTAL, FREET4, T3FREE, THYROIDAB in the last 72 hours. Anemia Panel: No results for input(s): VITAMINB12, FOLATE, FERRITIN, TIBC, IRON, RETICCTPCT in the last 72 hours. Urine analysis:    Component Value  Date/Time   COLORURINE YELLOW 05/25/2018 0326   APPEARANCEUR CLEAR 05/25/2018 0326   LABSPEC 1.010 05/25/2018 0326   PHURINE 7.5 05/25/2018 0326   GLUCOSEU NEGATIVE 05/25/2018 0326   HGBUR SMALL (A) 05/25/2018 0326   BILIRUBINUR NEGATIVE 05/25/2018 0326   KETONESUR 15 (A) 05/25/2018 0326   PROTEINUR 30 (A) 05/25/2018 0326   UROBILINOGEN 2.0 (H) 12/31/2009 1757   NITRITE NEGATIVE 05/25/2018 0326   LEUKOCYTESUR NEGATIVE 05/25/2018 0326   Sepsis Labs: !!!!!!!!!!!!!!!!!!!!!!!!!!!!!!!!!!!!!!!!!!!! @LABRCNTIP (procalcitonin:4,lacticidven:4) ) Recent Results (from the past 240 hour(s))  Respiratory Panel by RT PCR (Flu A&B, Covid) - Nasopharyngeal Swab     Status: None   Collection Time: 01/04/19  1:08 PM   Specimen: Nasopharyngeal Swab  Result Value Ref Range Status   SARS Coronavirus 2 by RT PCR NEGATIVE NEGATIVE Final    Comment: (NOTE) SARS-CoV-2 target nucleic acids are NOT DETECTED. The SARS-CoV-2 RNA is generally detectable in upper respiratoy specimens during the acute phase of infection. The lowest concentration of SARS-CoV-2 viral copies this  assay can detect is 131 copies/mL. A negative result does not preclude SARS-Cov-2 infection and should not be used as the sole basis for treatment or other patient management decisions. A negative result may occur with  improper specimen collection/handling, submission of specimen other than nasopharyngeal swab, presence of viral mutation(s) within the areas targeted by this assay, and inadequate number of viral copies (<131 copies/mL). A negative result must be combined with clinical observations, patient history, and epidemiological information. The expected result is Negative. Fact Sheet for Patients:  PinkCheek.be Fact Sheet for Healthcare Providers:  GravelBags.it This test is not yet ap proved or cleared by the [place] FDA and  has been authorized for detection  and/or diagnosis of SARS-CoV-2 by FDA under an Emergency Use Authorization (EUA). This EUA will remain  in effect (meaning this test can be used) for the duration of the COVID-19 declaration under Section 564(b)(1) of the Act, 21 U.S.C. section 360bbb-3(b)(1), unless the authorization is terminated or revoked sooner.    Influenza A by PCR NEGATIVE NEGATIVE Final   Influenza B by PCR NEGATIVE NEGATIVE Final    Comment: (NOTE) The Xpert Xpress SARS-CoV-2/FLU/RSV assay is intended as an aid in  the diagnosis of influenza from Nasopharyngeal swab specimens and  should not be used as a sole basis for treatment. Nasal washings and  aspirates are unacceptable for Xpert Xpress SARS-CoV-2/FLU/RSV  testing. Fact Sheet for Patients: PinkCheek.be Fact Sheet for Healthcare Providers: GravelBags.it This test is not yet approved or cleared by the [place] FDA and  has been authorized for detection and/or diagnosis of SARS-CoV-2 by  FDA under an Emergency Use Authorization (EUA). This EUA will remain  in effect (meaning this test can be used) for the duration of the  Covid-19 declaration under Section 564(b)(1) of the Act, 21  U.S.C. section 360bbb-3(b)(1), unless the authorization is  terminated or revoked. Performed at [facility], [place] [address] [place], [place] [address]     Radiological Exams on Admission: RUQ [place]  Result Date: 01/04/2019 CLINICAL DATA:  Upper abdominal pain EXAM: ULTRASOUND ABDOMEN LIMITED RIGHT UPPER QUADRANT COMPARISON:  None. FINDINGS: Gallbladder: No gallstones or wall thickening visualized. There is slight pericholecystic fluid. No sonographic Murphy sign noted by sonographer. Common bile duct: Diameter: 5 mm. No intrahepatic or extrahepatic biliary duct dilatation. Liver: No focal lesion identified. Liver echogenicity is increased. Portal vein is patent on color Doppler imaging with normal  direction of blood flow towards the liver. Other: None. IMPRESSION: 1. There is mild pericholecystic fluid. No gallbladder wall thickening or gallstones evident. Etiology for this pericholecystic fluid is uncertain. This is a finding that may be associated with early acute cholecystitis. In this regard, it may be prudent to consider nuclear medicine hepatobiliary imaging study to assess for cystic duct patency. 2. Increase in liver echogenicity, a finding indicative of hepatic steatosis. No focal liver lesions are evident. Electronically Signed   By: Lowella Grip III M.D.   On: 01/04/2019 16:18    EKG: Independently reviewed.  Showed sinus tachycardia rate of 131 and a QTC of 457  Assessment/Plan Active Problems:   HTN (hypertension)   Lactic acidosis   Alcohol abuse   Abnormal LFTs   Acute gallstone pancreatitis   Pancreatitis  SIRS from Acute Pancreatitis -Presented with tachypnea, tachycardia, leukocytosis and a pancreatitis -Unclear Etiology of her pancreatitis but could be biliary versus alcohol induced as she drinks at least 3 times a week -Patient's lipase level is over 4000 LFTs  were also abnormal -N.p.o. and bowel rest -Continue IV fluid hydration at 125 mL's per hour and will give another additional 1 L bolus; she has already 3 mL boluses in the ED and will give an additional 1 L and she was written for 250 mL's per hour and will reduce this to 1.5 mL following -Continue to monitor trend LFTs and lipase level -Continue with pain control -Gastroenterology was consulted for further evaluation -No abdominal imaging was done but ultrasound abdomen is currently been ordered and pending to be done next-pending ultrasound of abdomen may need an MRCP and/or an ERCP -Continue to monitor and follow lipase levels and LFTs  Hyponatremia/Hypocholremia -Patient sodium is 131 and chloride level of 92 -Continue IV fluid hydration as above  Lactic Acidosis -Patient's lactic acid level on  admission was 2.4 -IV fluid hydration as above -Continue to monitor and trend and repeat lactic acid level in a.m.  AKI  Elevated anion gap metabolic acidosis -in the setting of nausea vomiting and dehydration from pancreatitis -Avoid nephrotoxic medications and will hold her lisinopril -Patient's CO2 on admission was 19, chloride was 92, and anion gap was 20 -Continue IV fluid hydration as above and may need a renal ultrasound -Avoid nephrotoxic medications, contrast dyes, hypotension and renally adjust medications -Continue to monitor and trend renal function next-repeat CMP in a.m.  Hyperglycemia -Patient blood sugar on admission was 139 next -Check hemoglobin A1c in the a.m. -If necessary will place on sensitive NovoLog sliding scale insulin  Leukocytosis -Patient's WBC was 16.4 -In the setting of pancreatitis -Continue to monitor for signs and symptoms of infection -Repeat CBC in a.m.  Abnormal LFTs -Patient's AST was 1669 and ALT was 285 -Checking renal ultrasound I will also check a hepatitis panel -Continue to monitor and trend LFTs and may need further work-up and imaging and will defer to GI for this next -Repeat CMP in a.m.  Essential Hypertension -Was elevated on admission at 163/126 -We will place on IV hydralazine as we are holding her lisinopril -Continue metoprolol  Alcohol abuse -Patient drinks at least 3 times a week -We will place on CIWA protocol and continue with folic acid, multivitamin and thiamine  GERD -PPI  DVT prophylaxis: Heparin  Code Status: FULL  Family Communication: No family present  Disposition Plan: Pending improvement and back to baseline  Consults called: Gastroenterology  Admission status: Inpatient Tele   Severity of Illness: The appropriate patient status for this patient is INPATIENT. Inpatient status is judged to be reasonable and necessary in order to provide the required intensity of service to ensure the patient's safety.  The patient's presenting symptoms, physical exam findings, and initial radiographic and laboratory data in the context of their chronic comorbidities is felt to place them at high risk for further clinical deterioration. Furthermore, it is not anticipated that the patient will be medically stable for discharge from the hospital within 2 midnights of admission. The following factors support the patient status of inpatient.   " The patient's presenting symptoms include N/V/Abdominal Pain " The worrisome physical exam findings include Dry MM. " The initial radiographic and laboratory data are worrisome because of Pancreatitis  " The chronic co-morbidities are as above  * I certify that at the point of admission it is my clinical judgment that the patient will require inpatient hospital care spanning beyond 2 midnights from the point of admission due to high intensity of service, high risk for further deterioration and high frequency of surveillance required.*  Kerney Elbe, D.O. [facility] is on [place]  If 7PM-7AM, please contact night-coverage [email or web address]  01/04/2019, 5:24 PM

## 2019-01-04 NOTE — ED Triage Notes (Deleted)
Pt arrived by EMS c/o 3 days n/v, epigastric abdominal pain and dehydration.   HR 135 BP 88/52  4 mg zofran 1000 ml NS bolus

## 2019-01-04 NOTE — ED Notes (Addendum)
Pt ambulated to RR without assistance.  Gait was steady.  Pt unable to provide urine sample at this time.

## 2019-01-04 NOTE — Consult Note (Addendum)
Referring Provider:  [facility]         Primary Care Physician:  Patient, No Pcp Per Primary Gastroenterologist: unassigned              We were asked to see this patient for:     pancreatitis             ASSESSMENT /  PLAN   1.  33 yo female with acute pancreatitis ( ? SIRS), possibly biliary related with abnormal liver tests. [place] is pending. She drinks ~ 3 times a week (wine, double shots of liquor) though enzymes much higher than expected from  Etoh alone. Hospitalized in May 2020 with chest pain and concern for Etoh withdrawal. At that time her liver tests were more c/w Etoh. ACE inhibitors have been associated with pancreatitis but seems less likely etiology.      --Await [place]. If no cholelithiasis / biliary duct dilation then may need MRCP --Aggressive IV fluid resuscitation, pain control --Am liver tests and CBC  2.  HTN. Diastolic [id number] right now. Took am dose of Lisinopril but in pain which is likely driving BP right now  HPI:    Chief Complaint: abdominal pain  Yolanda Flores is a 33 y.o. female with PMH remarkable for HTN, Etoh and tobacco use. She presented to ED today for evaluation of N/V and upper abdominal pain. Found to have AKI, electrolyte abnormalities, elevated white count, abnormal liver tests and lipase of 4K.  [place] is pending. Her abdominal pain began several days ago, it has been constant since Sunday. She has been confined to the bed for 2 days due to pain though hasn't been able to sleep. . She hurts in LUQ / Left mid abdomen, non-radiating to back. Never had this pain before. No [place] of pancreatic problems. She drinks a couple of drinks ~ 3 times a week. No chronic GI complaints. She has been taking protonix since admission for chest pain in May. She takes Carafate but not recently. She endorses mild discomfort in chest with swallowing   Past Medical History:  Diagnosis Date  . Hypertension     Past Surgical History:  Procedure Laterality Date  . FOOT  SURGERY      Prior to Admission medications   Medication Sig Start Date End Date Taking? Authorizing Provider  lisinopril (ZESTRIL) 20 MG tablet Take 1 tablet (20 mg total) by mouth daily. Patient not taking: Reported on 01/04/2019 07/14/18   Argentina Donovan, PA-C  metoprolol tartrate (LOPRESSOR) 25 MG tablet Take 1 tablet (25 mg total) by mouth 2 (two) times daily. Patient not taking: Reported on 01/04/2019 07/14/18   Argentina Donovan, PA-C  pantoprazole (PROTONIX) 40 MG tablet Take 1 tablet (40 mg total) by mouth daily at 12 noon. Patient not taking: Reported on 01/04/2019 07/14/18   Argentina Donovan, PA-C  sucralfate (CARAFATE) 1 GM/10ML suspension Take 10 mLs (1 g total) by mouth 4 (four) times daily -  with meals and at bedtime. Patient not taking: Reported on 01/04/2019 07/14/18   Argentina Donovan, PA-C  thiamine 100 MG tablet Take 1 tablet (100 mg total) by mouth daily. Patient not taking: Reported on 01/04/2019 05/28/18   Kayleen Memos, DO    Current Facility-Administered Medications  Medication Dose Route Frequency Provider Last Rate Last Admin  . 0.9 %  sodium chloride infusion   Intravenous Continuous Lacretia Leigh, MD 125 mL/hr at 01/04/19 1416 New Bag at 01/04/19 1416   Current  Outpatient Medications  Medication Sig Dispense Refill  . lisinopril (ZESTRIL) 20 MG tablet Take 1 tablet (20 mg total) by mouth daily. (Patient not taking: Reported on 01/04/2019) 90 tablet 1  . metoprolol tartrate (LOPRESSOR) 25 MG tablet Take 1 tablet (25 mg total) by mouth 2 (two) times daily. (Patient not taking: Reported on 01/04/2019) 180 tablet 1  . pantoprazole (PROTONIX) 40 MG tablet Take 1 tablet (40 mg total) by mouth daily at 12 noon. (Patient not taking: Reported on 01/04/2019) 90 tablet 0  . sucralfate (CARAFATE) 1 GM/10ML suspension Take 10 mLs (1 g total) by mouth 4 (four) times daily -  with meals and at bedtime. (Patient not taking: Reported on 01/04/2019) 420 mL 2  . thiamine 100 MG  tablet Take 1 tablet (100 mg total) by mouth daily. (Patient not taking: Reported on 01/04/2019) 60 tablet 0    Allergies as of 01/04/2019  . (No Known Allergies)    Family History  Problem Relation Age of Onset  . Hypertension Mother   . Hypertension Father   . Hypertension Brother     Social History   Socioeconomic History  . Marital status: Single    Spouse name: Not on file  . Number of children: Not on file  . Years of education: Not on file  . Highest education level: Not on file  Occupational History  . Not on file  Tobacco Use  . Smoking status: Former Smoker    Packs/day: 4.00    Types: Cigarettes    Quit date: 05/24/2018    Years since quitting: 0.6  . Smokeless tobacco: Never Used  Substance and Sexual Activity  . Alcohol use: Yes    Comment: occasionally  . Drug use: No  . Sexual activity: Yes  Other Topics Concern  . Not on file  Social History Narrative  . Not on file   Social Determinants of Health   Financial Resource Strain:   . Difficulty of Paying Living Expenses: Not on file  Food Insecurity:   . Worried About Charity fundraiser in the Last Year: Not on file  . Ran Out of Food in the Last Year: Not on file  Transportation Needs:   . Lack of Transportation (Medical): Not on file  . Lack of Transportation (Non-Medical): Not on file  Physical Activity:   . Days of Exercise per Week: Not on file  . Minutes of Exercise per Session: Not on file  Stress:   . Feeling of Stress : Not on file  Social Connections:   . Frequency of Communication with Friends and Family: Not on file  . Frequency of Social Gatherings with Friends and Family: Not on file  . Attends Religious Services: Not on file  . Active Member of Clubs or Organizations: Not on file  . Attends Archivist Meetings: Not on file  . Marital Status: Not on file  Intimate Partner Violence:   . Fear of Current or Ex-Partner: Not on file  . Emotionally Abused: Not on file  .  Physically Abused: Not on file  . Sexually Abused: Not on file    Review of Systems: All systems reviewed and negative except where noted in HPI.  Physical Exam: Vital signs in last 24 hours: Temp:  [97.6 F (36.4 C)-98.5 F (36.9 C)] 98.5 F (36.9 C) (12/22 0938) Pulse Rate:  [111-136] 118 (12/22 1430) Resp:  [17-32] 24 (12/22 1430) BP: (124-154)/(101-123) 151/120 (12/22 1430) SpO2:  [98 %-100 %] 100 % (  12/22 1430) Weight:  [65.8 kg] 65.8 kg (12/22 0939)   General:   Alert, well-developed,  female in NAD Psych:  Pleasant, cooperative. Normal mood and affect. Eyes:  Pupils equal, sclera clear, no icterus.   Conjunctiva pink. Ears:  Normal auditory acuity. Nose:  No deformity, discharge,  or lesions. Neck:  Supple; no masses Lungs:  Clear throughout to auscultation.   No wheezes, crackles, or rhonchi.  Heart:  Regular rate and rhythm; no murmurs, no lower extremity edema Abdomen:  Soft, non-distended, mild LUQ discomfort, hypoactive BS active, no palp mass   Rectal:  Deferred  Msk:  Symmetrical without gross deformities. . Neurologic:  Alert and  oriented x4;  grossly normal neurologically. Skin:  Intact without significant lesions or rashes.   Intake/Output from previous day: No intake/output data recorded. Intake/Output this shift: Total I/O In: 3000 [IV Piggyback:3000] Out: -   Lab Results: Recent Labs    01/04/19 0943  WBC 16.4*  HGB 14.6  HCT 42.0  PLT 333   BMET Recent Labs    01/04/19 1041  NA 131*  K 4.4  CL 92*  CO2 19*  GLUCOSE 139*  BUN 36*  CREATININE 3.30*  CALCIUM 7.1*   LFT Recent Labs    01/04/19 1041  PROT 7.1  ALBUMIN 3.8  AST 1,669*  ALT 285*  ALKPHOS 74  BILITOT 2.6*   PT/INR No results for input(s): LABPROT, INR in the last 72 hours. Hepatitis Panel No results for input(s): HEPBSAG, HCVAB, HEPAIGM, HEPBIGM in the last 72 hours.   . CBC Latest Ref Rng & Units 01/04/2019 05/27/2018 05/24/2018  WBC 4.0 - 10.5 K/uL  16.4(H) 4.4 4.3  Hemoglobin 12.0 - 15.0 g/dL 14.6 12.4 12.4  Hematocrit 36.0 - 46.0 % 42.0 35.6(L) 34.7(L)  Platelets 150 - 400 K/uL 333 122(L) 229    . CMP Latest Ref Rng & Units 01/04/2019 05/27/2018 05/26/2018  Glucose 70 - 99 mg/dL 139(H) 103(H) 100(H)  BUN 6 - 20 mg/dL 36(H) <5(L) <5(L)  Creatinine 0.44 - 1.00 mg/dL 3.30(H) 0.69 0.68  Sodium 135 - 145 mmol/L 131(L) 137 135  Potassium 3.5 - 5.1 mmol/L 4.4 3.7 3.4(L)  Chloride 98 - 111 mmol/L 92(L) 99 95(L)  CO2 22 - 32 mmol/L 19(L) 26 27  Calcium 8.9 - 10.3 mg/dL 7.1(L) 9.3 8.6(L)  Total Protein 6.5 - 8.1 g/dL 7.1 7.9 7.7  Total Bilirubin 0.3 - 1.2 mg/dL 2.6(H) 2.3(H) 2.4(H)  Alkaline Phos 38 - 126 U/L 74 94 98  AST 15 - 41 U/L 1,669(H) 388(H) 328(H)  ALT 0 - 44 U/L 285(H) 112(H) 85(H)   Studies/Results: No results found.  Active Problems:   Acute gallstone pancreatitis    Tye Savoy, NP-C @  01/04/2019, 2:57 PM    Attending physician's note   I have taken an interval history, reviewed the chart and examined the patient. I agree with the Advanced Practitioner's note, impression and recommendations.   Acute pancreatitis likely d/t ETOH with SIRS (HR>90, Rr>20, WBC>12) with AKI  (Cr>2), metabolic acidosis. BUN 36, Cr 3.3, Hct 42. Lipase >4000. Abn LFTs. [place] - neg for cholelithiasis. No IHBR dilatation. R/O other causes.  ETOH abuse. AST:ALT ratio c/w ETOH liver disease. Has H/O Fatty liver.  Covid 19 - negative  Plan: -IVF (bolus given, then 250 mlhr x 24hr, then 49ml/hr). Watch for overhydration. -Clear liquids (early feeding) -No ERCP in absence of cholangitis. -No need for antibiotics for now. -ETOH and smoking cessation. May need help out-pt. -Trend  CBC, CMP, lipase. -Add IgG4 (r/o autoimmune pancreatitis) in AM. (not ordered) -Agree to hold lisinopril d/t AKI. -VTE prophylaxis. -If no improvement in 48hrs, consider CT pancreatic protocol once Cr improves. -Will follow along.   Carmell Austria, MD Velora Heckler  Fabienne Bruns [phone].

## 2019-01-04 NOTE — ED Notes (Signed)
US at bedside

## 2019-01-04 NOTE — ED Provider Notes (Signed)
[place] DEPT Provider Note   CSN: [id number] Arrival date & time: 01/04/19  [id number]     History No chief complaint on file.   Yolanda Flores is a 33 y.o. female.  33 year old female presents with several days of abdominal discomfort with nonbilious emesis.  No fever or chills.  Pain does not radiate to her back.  Has been epigastric and right upper quadrant area.  No prior history of same.  Patient states that she did consume 5 shots of alcohol several hours prior to the event.  Denies any vaginal bleeding or discharge.  No treatment use prior to arrival.  [facility] and was found to be slightly hypotensive and given 1 L of fluid as well as Zofran and transported here.        Past Medical History:  Diagnosis Date  . Hypertension     Patient Active Problem List   Diagnosis Date Noted  . Chest pain 05/24/2018  . HTN (hypertension) 05/24/2018  . Hypokalemia 05/24/2018  . Lactic acidosis 05/24/2018  . Tobacco abuse 05/24/2018  . Alcohol abuse 05/24/2018  . Abnormal LFTs 05/24/2018  . Prolonged QT interval     Past Surgical History:  Procedure Laterality Date  . FOOT SURGERY       OB History   No obstetric history on file.     Family History  Problem Relation Age of Onset  . Hypertension Mother   . Hypertension Father   . Hypertension Brother     Social History   Tobacco Use  . Smoking status: Former Smoker    Packs/day: 4.00    Types: Cigarettes    Quit date: 05/24/2018    Years since quitting: 0.6  . Smokeless tobacco: Never Used  Substance Use Topics  . Alcohol use: Yes    Comment: occasionally  . Drug use: No    Home Medications Prior to Admission medications   Medication Sig Start Date End Date Taking? Authorizing Provider  lisinopril (ZESTRIL) 20 MG tablet Take 1 tablet (20 mg total) by mouth daily. 07/14/18   Argentina Donovan, PA-C  metoprolol tartrate (LOPRESSOR) 25 MG tablet Take 1 tablet (25 mg total) by  mouth 2 (two) times daily. 07/14/18   Argentina Donovan, PA-C  nicotine (NICODERM CQ - DOSED IN MG/24 HOURS) 21 mg/24hr patch Place 1 patch (21 mg total) onto the skin daily. Patient not taking: Reported on 07/14/2018 05/28/18   Kayleen Memos, DO  pantoprazole (PROTONIX) 40 MG tablet Take 1 tablet (40 mg total) by mouth daily at 12 noon. 07/14/18   Argentina Donovan, PA-C  sucralfate (CARAFATE) 1 GM/10ML suspension Take 10 mLs (1 g total) by mouth 4 (four) times daily -  with meals and at bedtime. 07/14/18   Argentina Donovan, PA-C  thiamine 100 MG tablet Take 1 tablet (100 mg total) by mouth daily. 05/28/18   Kayleen Memos, DO    Allergies    Patient has no known allergies.  Review of Systems   Review of Systems  All other systems reviewed and are negative.   Physical Exam Updated Vital Signs BP (!) 124/101 (BP Location: Left Arm) Comment: RN aware  Pulse (!) 135   Temp 97.6 F (36.4 C) (Oral)   Resp 20   SpO2 100%   Physical Exam Vitals and nursing note reviewed.  Constitutional:      General: She is not in acute distress.    Appearance: Normal appearance. She is  well-developed. She is not toxic-appearing.  HENT:     Head: Normocephalic and atraumatic.  Eyes:     General: Lids are normal.     Conjunctiva/sclera: Conjunctivae normal.     Pupils: Pupils are equal, round, and reactive to light.  Neck:     Thyroid: No thyroid mass.     Trachea: No tracheal deviation.  Cardiovascular:     Rate and Rhythm: Regular rhythm. Tachycardia present.     Heart sounds: Normal heart sounds. No murmur. No gallop.   Pulmonary:     Effort: Pulmonary effort is normal. No respiratory distress.     Breath sounds: Normal breath sounds. No stridor. No decreased breath sounds, wheezing, rhonchi or rales.  Abdominal:     General: Bowel sounds are normal. There is no distension.     Palpations: Abdomen is soft.     Tenderness: There is abdominal tenderness in the right upper quadrant and epigastric  area. There is guarding. There is no rebound.    Musculoskeletal:        General: No tenderness. Normal range of motion.     Cervical back: Normal range of motion and neck supple.  Skin:    General: Skin is warm and dry.     Findings: No abrasion or rash.  Neurological:     Mental Status: She is alert and oriented to person, place, and time.     GCS: GCS eye subscore is 4. GCS verbal subscore is 5. GCS motor subscore is 6.     Cranial Nerves: No cranial nerve deficit.     Sensory: No sensory deficit.  Psychiatric:        Speech: Speech normal.        Behavior: Behavior normal.     ED Results / Procedures / Treatments   Labs (all labs ordered are listed, but only abnormal results are displayed) Labs Reviewed  CBC WITH DIFFERENTIAL/PLATELET  COMPREHENSIVE METABOLIC PANEL  LIPASE, BLOOD  LACTIC ACID, PLASMA  I-STAT BETA HCG BLOOD, ED ([facility], [facility], [facility] ONLY)    EKG None  Radiology No results found.  Procedures Procedures (including critical care time)  Medications Ordered in ED Medications  sodium chloride 0.9 % bolus 2,000 mL (has no administration in time range)  0.9 %  sodium chloride infusion (has no administration in time range)  morphine 4 MG/ML injection 6 mg (has no administration in time range)    ED Course  I have reviewed the triage vital signs and the nursing notes.  Pertinent labs & imaging results that were available during my care of the patient were reviewed by me and considered in my medical decision making (see chart for details).    MDM Rules/Calculators/A&P                      Patient with evidence of acute kidney injury here with a creatinine of 3.  Given 3 L of IV saline.  Has a mild leukocytosis as well as elevated lipase.  Transaminases are also significantly elevated.  Suspect gallstone pancreatitis.  Spoke with [facility] who will see the patient in consultation.  Patient has been medicated at this time for pain.  Will admit to the hospital  service  CRITICAL CARE Performed by: Leota Jacobsen Total critical care time: 50 minutes Critical care time was exclusive of separately billable procedures and treating other patients. Critical care was necessary to treat or prevent imminent or life-threatening deterioration. Critical care was time  spent personally by me on the following activities: development of treatment plan with patient and/or surrogate as well as nursing, discussions with consultants, evaluation of patient's response to treatment, examination of patient, obtaining history from patient or surrogate, ordering and performing treatments and interventions, ordering and review of laboratory studies, ordering and review of radiographic studies, pulse oximetry and re-evaluation of patient's condition.  Final Clinical Impression(s) / ED Diagnoses Final diagnoses:  None    Rx / DC Orders ED Discharge Orders    None       Lacretia Leigh, MD 01/04/19 1349

## 2019-01-04 NOTE — ED Triage Notes (Signed)
Pt arrived by EMS c/o 3 days n/v, epigastric abdominal pain and dehydration. Tested negative for COVID-19 at work 12/28/2018. 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   HR 135 BP 88/52  4 mg zofran 1000 ml NS bolus

## 2019-01-04 NOTE — ED Notes (Addendum)
Date and time results received: 01/04/19 1046  Test: Lactic acid  Critical Value: 2.4  Name of Provider Notified: Dr. Zenia Resides  Orders Received? Or Actions Taken?: n/a

## 2019-01-05 ENCOUNTER — Inpatient Hospital Stay (HOSPITAL_COMMUNITY): Payer: BLUE CROSS/BLUE SHIELD

## 2019-01-05 DIAGNOSIS — R945 Abnormal results of liver function studies: Secondary | ICD-10-CM

## 2019-01-05 DIAGNOSIS — K851 Biliary acute pancreatitis without necrosis or infection: Secondary | ICD-10-CM

## 2019-01-05 LAB — CBC WITH DIFFERENTIAL/PLATELET
Abs Immature Granulocytes: 0.02 10*3/uL (ref 0.00–0.07)
Basophils Absolute: 0.1 10*3/uL (ref 0.0–0.1)
Basophils Relative: 1 %
Eosinophils Absolute: 0.1 10*3/uL (ref 0.0–0.5)
Eosinophils Relative: 1 %
HCT: 37.6 % (ref 36.0–46.0)
Hemoglobin: 12.4 g/dL (ref 12.0–15.0)
Immature Granulocytes: 0 %
Lymphocytes Relative: 6 %
Lymphs Abs: 0.4 10*3/uL — ABNORMAL LOW (ref 0.7–4.0)
MCH: 32.7 pg (ref 26.0–34.0)
MCHC: 33 g/dL (ref 30.0–36.0)
MCV: 99.2 fL (ref 80.0–100.0)
Monocytes Absolute: 0.3 10*3/uL (ref 0.1–1.0)
Monocytes Relative: 5 %
Neutro Abs: 6.5 10*3/uL (ref 1.7–7.7)
Neutrophils Relative %: 87 %
Platelets: 176 10*3/uL (ref 150–400)
RBC: 3.79 MIL/uL — ABNORMAL LOW (ref 3.87–5.11)
RDW: 14.5 % (ref 11.5–15.5)
WBC: 7.4 10*3/uL (ref 4.0–10.5)
nRBC: 0 % (ref 0.0–0.2)

## 2019-01-05 LAB — HEMOGLOBIN A1C
Hgb A1c MFr Bld: 4.9 % (ref 4.8–5.6)
Mean Plasma Glucose: 93.93 mg/dL

## 2019-01-05 LAB — CBG MONITORING, ED: Glucose-Capillary: 107 mg/dL — ABNORMAL HIGH (ref 70–99)

## 2019-01-05 LAB — COMPREHENSIVE METABOLIC PANEL
ALT: 160 U/L — ABNORMAL HIGH (ref 0–44)
AST: 538 U/L — ABNORMAL HIGH (ref 15–41)
Albumin: 3 g/dL — ABNORMAL LOW (ref 3.5–5.0)
Alkaline Phosphatase: 85 U/L (ref 38–126)
Anion gap: 15 (ref 5–15)
BUN: 43 mg/dL — ABNORMAL HIGH (ref 6–20)
CO2: 13 mmol/L — ABNORMAL LOW (ref 22–32)
Calcium: 5.9 mg/dL — CL (ref 8.9–10.3)
Chloride: 105 mmol/L (ref 98–111)
Creatinine, Ser: 4.19 mg/dL — ABNORMAL HIGH (ref 0.44–1.00)
GFR calc Af Amer: 15 mL/min — ABNORMAL LOW (ref >60)
GFR calc non Af Amer: 13 mL/min — ABNORMAL LOW (ref >60)
Glucose, Bld: 106 mg/dL — ABNORMAL HIGH (ref 70–99)
Potassium: 3.9 mmol/L (ref 3.5–5.1)
Sodium: 133 mmol/L — ABNORMAL LOW (ref 135–145)
Total Bilirubin: 1.5 mg/dL — ABNORMAL HIGH (ref 0.3–1.2)
Total Protein: 6.1 g/dL — ABNORMAL LOW (ref 6.5–8.1)

## 2019-01-05 LAB — MAGNESIUM: Magnesium: 1.3 mg/dL — ABNORMAL LOW (ref 1.7–2.4)

## 2019-01-05 LAB — PHOSPHORUS: Phosphorus: 1 mg/dL — CL (ref 2.5–4.6)

## 2019-01-05 LAB — TSH: TSH: 0.567 u[IU]/mL (ref 0.350–4.500)

## 2019-01-05 LAB — HEPATITIS PANEL, ACUTE
HCV Ab: NONREACTIVE
Hep A IgM: NONREACTIVE
Hep B C IgM: NONREACTIVE
Hepatitis B Surface Ag: NONREACTIVE

## 2019-01-05 LAB — LIPASE, BLOOD: Lipase: 1642 U/L — ABNORMAL HIGH (ref 11–51)

## 2019-01-05 LAB — ACETAMINOPHEN LEVEL: Acetaminophen (Tylenol), Serum: <10 ug/mL — ABNORMAL LOW (ref 10–30)

## 2019-01-05 MED ORDER — CHLORHEXIDINE GLUCONATE CLOTH 2 % EX PADS
6.0000 | MEDICATED_PAD | Freq: Every day | CUTANEOUS | Status: DC
  Administered 2019-01-06 – 2019-01-14 (×9): 6 via TOPICAL

## 2019-01-05 MED ORDER — PHENOL 1.4 % MT LIQD
1.0000 | OROMUCOSAL | Status: DC | PRN
  Administered 2019-01-08 – 2019-01-11 (×3): 1 via OROMUCOSAL
  Filled 2019-01-05: qty 177

## 2019-01-05 MED ORDER — MAGNESIUM SULFATE 2 GM/50ML IV SOLN
2.0000 g | Freq: Once | INTRAVENOUS | Status: AC
  Administered 2019-01-05: 19:00:00 2 g via INTRAVENOUS
  Filled 2019-01-05: qty 50

## 2019-01-05 MED ORDER — MORPHINE SULFATE (PF) 2 MG/ML IV SOLN
1.0000 mg | INTRAVENOUS | Status: DC | PRN
  Administered 2019-01-14: 1 mg via INTRAVENOUS
  Filled 2019-01-05 (×2): qty 1

## 2019-01-05 MED ORDER — SODIUM BICARBONATE 8.4 % IV SOLN: INTRAVENOUS | Status: DC

## 2019-01-05 MED ORDER — MENTHOL 3 MG MT LOZG: 1.0000 | LOZENGE | OROMUCOSAL | Status: DC | PRN

## 2019-01-05 MED ORDER — POTASSIUM PHOSPHATES 15 MMOLE/5ML IV SOLN
10.0000 mmol | Freq: Once | INTRAVENOUS | Status: AC
  Administered 2019-01-05: 21:00:00 10 mmol via INTRAVENOUS
  Filled 2019-01-05 (×2): qty 3.33

## 2019-01-05 MED ORDER — PANTOPRAZOLE SODIUM 40 MG PO TBEC
40.0000 mg | DELAYED_RELEASE_TABLET | Freq: Once | ORAL | Status: AC
  Administered 2019-01-05: 40 mg via ORAL
  Filled 2019-01-05: qty 1

## 2019-01-05 MED ORDER — SODIUM BICARBONATE-DEXTROSE 150-5 MEQ/L-% IV SOLN
150.0000 meq | INTRAVENOUS | Status: DC
  Administered 2019-01-05 – 2019-01-06 (×2): 150 meq via INTRAVENOUS
  Filled 2019-01-05 (×3): qty 1000

## 2019-01-05 NOTE — Consult Note (Signed)
[facility] Consult/Admission Note  Yolanda Flores 1985/09/30  [id number].    Requesting Provider: Tye Savoy, PA-C Chief Complaint/Reason for Consult: possible gallstone pancreatitis  HPI:   Patient is a 33 yo female with no past abdominal surgical history who presented to the ED with complaints of abdominal pain. Patient does drink ETOH and has a h/o HTN. She states she drinks about a pint in 3 days. She denies illicit drug use. She states Sunday night around midnight she had a sudden onset of epigastric abdominal pain. Pain was severe, constant, non radiating with associated nausea and nonbillious emesis. Abdominal pain is persistent. No changes in bowel movements. No urinary complaints. No fever, chills, diarrhea, SOB or CP. Patient is complaining of sore throat worse with swallowing.   ROS:  Review of Systems  Constitutional: Negative for chills, diaphoresis and fever.  HENT: Positive for sore throat.   Respiratory: Negative for cough and shortness of breath.   Cardiovascular: Negative for chest pain.  Gastrointestinal: Positive for abdominal pain, nausea and vomiting. Negative for blood in stool, constipation and diarrhea.  Genitourinary: Negative for dysuria.  Skin: Negative for rash.  Neurological: Negative for dizziness and loss of consciousness.  All other systems reviewed and are negative.    Family History  Problem Relation Age of Onset  . Hypertension Mother   . Hypertension Father   . Hypertension Brother     Past Medical History:  Diagnosis Date  . Hypertension     Past Surgical History:  Procedure Laterality Date  . FOOT SURGERY      Social History:  reports that she quit smoking about 7 months ago. Her smoking use included cigarettes. She smoked 4.00 packs per day. She has never used smokeless tobacco. She reports current alcohol use. She reports that she does not use drugs.  Allergies: No Known Allergies  Medications Prior to  Admission  Medication Sig Dispense Refill  . lisinopril (ZESTRIL) 20 MG tablet Take 1 tablet (20 mg total) by mouth daily. (Patient not taking: Reported on 01/04/2019) 90 tablet 1  . metoprolol tartrate (LOPRESSOR) 25 MG tablet Take 1 tablet (25 mg total) by mouth 2 (two) times daily. (Patient not taking: Reported on 01/04/2019) 180 tablet 1  . pantoprazole (PROTONIX) 40 MG tablet Take 1 tablet (40 mg total) by mouth daily at 12 noon. (Patient not taking: Reported on 01/04/2019) 90 tablet 0  . sucralfate (CARAFATE) 1 GM/10ML suspension Take 10 mLs (1 g total) by mouth 4 (four) times daily -  with meals and at bedtime. (Patient not taking: Reported on 01/04/2019) 420 mL 2  . thiamine 100 MG tablet Take 1 tablet (100 mg total) by mouth daily. (Patient not taking: Reported on 01/04/2019) 60 tablet 0    Blood pressure 113/86, pulse 99, temperature 98.5 F (36.9 C), temperature source Oral, resp. rate 20, height 5\' 7"  (1.702 m), weight 65.8 kg, last menstrual period 12/14/2018, SpO2 100 %.  Physical Exam Vitals and nursing note reviewed. Exam conducted with a chaperone present.  Constitutional:      General: She is not in acute distress.    Appearance: Normal appearance. She is not diaphoretic.  HENT:     Head: Normocephalic and atraumatic.     Nose: Nose normal.     Mouth/Throat:     Lips: Pink.     Mouth: Mucous membranes are moist.     Dentition: Abnormal dentition.     Pharynx: Oropharynx is clear. Posterior oropharyngeal erythema present. No  pharyngeal swelling, oropharyngeal exudate or uvula swelling.     Comments: Posterior oropharynx is mildly injected without exudate or swelling. Eyes:     General: No scleral icterus.       Right eye: No discharge.        Left eye: No discharge.     Conjunctiva/sclera: Conjunctivae normal.     Pupils: Pupils are equal, round, and reactive to light.  Cardiovascular:     Rate and Rhythm: Normal rate and regular rhythm.     Heart sounds: Normal  heart sounds. No murmur.  Pulmonary:     Effort: Pulmonary effort is normal. No respiratory distress.     Breath sounds: Normal breath sounds. No wheezing, rhonchi or rales.  Abdominal:     General: Bowel sounds are normal. There is no distension.     Palpations: Abdomen is soft. Abdomen is not rigid.     Tenderness: There is abdominal tenderness in the right upper quadrant, epigastric area and left upper quadrant. There is guarding.     Hernia: No hernia is present.  Musculoskeletal:        General: No tenderness or deformity. Normal range of motion.     Cervical back: Normal range of motion and neck supple.  Skin:    General: Skin is warm and dry.     Findings: No rash.  Neurological:     Mental Status: She is alert and oriented to person, place, and time.  Psychiatric:        Mood and Affect: Mood normal.        Behavior: Behavior normal.     Results for orders placed or performed during the hospital encounter of 01/04/19 (from the past 48 hour(s))  CBC with Differential     Status: Abnormal   Collection Time: 01/04/19  9:43 AM  Result Value Ref Range   WBC 16.4 (H) 4.0 - 10.5 K/uL   RBC 4.41 3.87 - 5.11 MIL/uL   Hemoglobin 14.6 12.0 - 15.0 g/dL   HCT 42.0 36.0 - 46.0 %   MCV 95.2 80.0 - 100.0 fL   MCH 33.1 26.0 - 34.0 pg   MCHC 34.8 30.0 - 36.0 g/dL   RDW 14.2 11.5 - 15.5 %   Platelets 333 150 - 400 K/uL   nRBC 0.0 0.0 - 0.2 %   Neutrophils Relative % 95 %   Neutro Abs 15.6 (H) 1.7 - 7.7 K/uL   Lymphocytes Relative 2 %   Lymphs Abs 0.3 (L) 0.7 - 4.0 K/uL   Monocytes Relative 2 %   Monocytes Absolute 0.4 0.1 - 1.0 K/uL   Eosinophils Relative 0 %   Eosinophils Absolute 0.0 0.0 - 0.5 K/uL   Basophils Relative 0 %   Basophils Absolute 0.0 0.0 - 0.1 K/uL   Immature Granulocytes 1 %   Abs Immature Granulocytes 0.09 (H) 0.00 - 0.07 K/uL    Comment: Performed at [facility], [place] [address] [place], [place] [address]  LACTIC ACID     Status: Abnormal    Collection Time: 01/04/19  9:43 AM  Result Value Ref Range   Lactic Acid, Venous 2.4 (HH) 0.5 - 1.9 mmol/L    Comment: CRITICAL RESULT CALLED TO, READ BACK BY AND VERIFIED WITH: Wynetta Emery, L. @ 1046 01/04/2019 PERRY,J.  Performed at [facility], [place] [address], [place], [place] [address]   I-Stat Beta hCG     Status: None   Collection Time: 01/04/19  9:58 AM  Result Value Ref Range   I-stat hCG, quantitative <5.0 <5 mIU/mL   Comment 3            Comment:   GEST. AGE      CONC.  (mIU/mL)   <=1 WEEK        5 - 50     2 WEEKS       50 - 500     3 WEEKS       100 - 10,000     4 WEEKS     1,000 - 30,000        FEMALE AND NON-PREGNANT FEMALE:     LESS THAN 5 mIU/mL   Comprehensive metabolic panel     Status: Abnormal   Collection Time: 01/04/19 10:41 AM  Result Value Ref Range   Sodium 131 (L) 135 - 145 mmol/L   Potassium 4.4 3.5 - 5.1 mmol/L   Chloride 92 (L) 98 - 111 mmol/L   CO2 19 (L) 22 - 32 mmol/L   Glucose, Bld 139 (H) 70 - 99 mg/dL   BUN 36 (H) 6 - 20 mg/dL   Creatinine, Ser 3.30 (H) 0.44 - 1.00 mg/dL   Calcium 7.1 (L) 8.9 - 10.3 mg/dL   Total Protein 7.1 6.5 - 8.1 g/dL   Albumin 3.8 3.5 - 5.0 g/dL   AST 1,669 (H) 15 - 41 U/L   ALT 285 (H) 0 - 44 U/L   Alkaline Phosphatase 74 38 - 126 U/L   Total Bilirubin 2.6 (H) 0.3 - 1.2 mg/dL   GFR calc non Af Amer 18 (L) >60 mL/min   GFR calc Af Amer 21 (L) >60 mL/min   Anion gap 20 (H) 5 - 15    Comment: Performed at [facility], [place] [address], [place], [place] [address]  Lipase, blood     Status: Abnormal   Collection Time: 01/04/19 10:41 AM  Result Value Ref Range   Lipase 4,101 (H) 11 - 51 U/L    Comment: RESULTS CONFIRMED BY MANUAL DILUTION Performed at [facility], [place] [address], [place], [place] [address]   Respiratory Panel by RT PCR (Flu A&B, Covid) - Nasopharyngeal Swab     Status: None   Collection Time: 01/04/19  1:08 PM   Specimen: Nasopharyngeal Swab   Result Value Ref Range   SARS Coronavirus 2 by RT PCR NEGATIVE NEGATIVE    Comment: (NOTE) SARS-CoV-2 target nucleic acids are NOT DETECTED. The SARS-CoV-2 RNA is generally detectable in upper respiratoy specimens during the acute phase of infection. The lowest concentration of SARS-CoV-2 viral copies this assay can detect is 131 copies/mL. A negative result does not preclude SARS-Cov-2 infection and should not be used as the sole basis for treatment or other patient management decisions. A negative result may occur with  improper specimen collection/handling, submission of specimen other than nasopharyngeal swab, presence of viral mutation(s) within the areas targeted by this assay, and inadequate number of viral copies (<131 copies/mL). A negative result must be combined with clinical observations, patient history, and epidemiological information. The expected result is Negative. Fact Sheet for Patients:  PinkCheek.be Fact Sheet for Healthcare Providers:  GravelBags.it This test is not yet ap proved or cleared by the [place] FDA and  has been authorized for detection and/or diagnosis of SARS-CoV-2 by FDA under an Emergency Use Authorization (EUA). This EUA will remain  in effect (meaning this test can be used) for the duration of the COVID-19 declaration  under Section 564(b)(1) of the Act, 21 U.S.C. section 360bbb-3(b)(1), unless the authorization is terminated or revoked sooner.    Influenza A by PCR NEGATIVE NEGATIVE   Influenza B by PCR NEGATIVE NEGATIVE    Comment: (NOTE) The Xpert Xpress SARS-CoV-2/FLU/RSV assay is intended as an aid in  the diagnosis of influenza from Nasopharyngeal swab specimens and  should not be used as a sole basis for treatment. Nasal washings and  aspirates are unacceptable for Xpert Xpress SARS-CoV-2/FLU/RSV  testing. Fact Sheet for  Patients: PinkCheek.be Fact Sheet for Healthcare Providers: GravelBags.it This test is not yet approved or cleared by the [place] FDA and  has been authorized for detection and/or diagnosis of SARS-CoV-2 by  FDA under an Emergency Use Authorization (EUA). This EUA will remain  in effect (meaning this test can be used) for the duration of the  Covid-19 declaration under Section 564(b)(1) of the Act, 21  U.S.C. section 360bbb-3(b)(1), unless the authorization is  terminated or revoked. Performed at [facility], [place] [address], [place], [place] [address]   CBC     Status: Abnormal   Collection Time: 01/04/19 10:08 PM  Result Value Ref Range   WBC 8.3 4.0 - 10.5 K/uL   RBC 3.65 (L) 3.87 - 5.11 MIL/uL   Hemoglobin 12.3 12.0 - 15.0 g/dL   HCT 35.1 (L) 36.0 - 46.0 %   MCV 96.2 80.0 - 100.0 fL   MCH 33.7 26.0 - 34.0 pg   MCHC 35.0 30.0 - 36.0 g/dL   RDW 14.1 11.5 - 15.5 %   Platelets 180 150 - 400 K/uL   nRBC 0.0 0.0 - 0.2 %    Comment: Performed at [facility], [place] [address] [place], [place] [address]  Magnesium     Status: Abnormal   Collection Time: 01/05/19  4:14 AM  Result Value Ref Range   Magnesium 1.3 (L) 1.7 - 2.4 mg/dL    Comment: Performed at [facility], [place] [address] [place], [place] [address]  Phosphorus     Status: Abnormal   Collection Time: 01/05/19  4:14 AM  Result Value Ref Range   Phosphorus 1.0 (LL) 2.5 - 4.6 mg/dL    Comment: CRITICAL RESULT CALLED TO, READ BACK BY AND VERIFIED WITH: PXENDINE,J @ 0644 ON [id number] BY POTEAT,S Performed at [place] [address], [place], [place] [address]   Comprehensive metabolic panel     Status: Abnormal   Collection Time: 01/05/19  4:14 AM  Result Value Ref Range   Sodium 133 (L) 135 - 145 mmol/L   Potassium 3.9 3.5 - 5.1 mmol/L   Chloride 105 98 - 111 mmol/L   CO2 13 (L)  22 - 32 mmol/L   Glucose, Bld 106 (H) 70 - 99 mg/dL   BUN 43 (H) 6 - 20 mg/dL   Creatinine, Ser 4.19 (H) 0.44 - 1.00 mg/dL   Calcium 5.9 (LL) 8.9 - 10.3 mg/dL    Comment: CRITICAL RESULT CALLED TO, READ BACK BY AND VERIFIED WITH: OXENDINE,J @ 0644 ON [id number] BY POTEAT,S    Total Protein 6.1 (L) 6.5 - 8.1 g/dL   Albumin 3.0 (L) 3.5 - 5.0 g/dL   AST 538 (H) 15 - 41 U/L   ALT 160 (H) 0 - 44 U/L   Alkaline Phosphatase 85 38 - 126 U/L   Total Bilirubin 1.5 (H) 0.3 - 1.2 mg/dL   GFR calc non Af Amer 13 (L) >60 mL/min  GFR calc Af Amer 15 (L) >60 mL/min   Anion gap 15 5 - 15    Comment: Performed at [facility], [place] [address], [place], [place] [address]  CBC with Differential/Platelet     Status: Abnormal   Collection Time: 01/05/19  4:14 AM  Result Value Ref Range   WBC 7.4 4.0 - 10.5 K/uL   RBC 3.79 (L) 3.87 - 5.11 MIL/uL   Hemoglobin 12.4 12.0 - 15.0 g/dL   HCT 37.6 36.0 - 46.0 %   MCV 99.2 80.0 - 100.0 fL   MCH 32.7 26.0 - 34.0 pg   MCHC 33.0 30.0 - 36.0 g/dL   RDW 14.5 11.5 - 15.5 %   Platelets 176 150 - 400 K/uL   nRBC 0.0 0.0 - 0.2 %   Neutrophils Relative % 87 %    Comment: VACUOLATED NEUTROPHILS   Neutro Abs 6.5 1.7 - 7.7 K/uL   Lymphocytes Relative 6 %   Lymphs Abs 0.4 (L) 0.7 - 4.0 K/uL   Monocytes Relative 5 %   Monocytes Absolute 0.3 0.1 - 1.0 K/uL   Eosinophils Relative 1 %   Eosinophils Absolute 0.1 0.0 - 0.5 K/uL   Basophils Relative 1 %   Basophils Absolute 0.1 0.0 - 0.1 K/uL   WBC Morphology MILD LEFT SHIFT (1-5% METAS, OCC MYELO, OCC BANDS)    Immature Granulocytes 0 %   Abs Immature Granulocytes 0.02 0.00 - 0.07 K/uL    Comment: Performed at [facility], [place] [address], [place], [place] [address]  TSH     Status: None   Collection Time: 01/05/19  4:14 AM  Result Value Ref Range   TSH 0.567 0.350 - 4.500 uIU/mL    Comment: Performed by a 3rd Generation assay with a functional sensitivity of <=0.01  uIU/mL. Performed at [facility], [place] [address], [place], [place] [address]   Hemoglobin A1c     Status: None   Collection Time: 01/05/19  4:14 AM  Result Value Ref Range   Hgb A1c MFr Bld 4.9 4.8 - 5.6 %    Comment: (NOTE) Pre diabetes:          5.7%-6.4% Diabetes:              >6.4% Glycemic control for   <7.0% adults with diabetes    Mean Plasma Glucose 93.93 mg/dL    Comment: Performed at [place] [address] [place], [place] [address]  Lipase, blood     Status: Abnormal   Collection Time: 01/05/19  4:14 AM  Result Value Ref Range   Lipase 1,642 (H) 11 - 51 U/L    Comment: RESULTS CONFIRMED BY MANUAL DILUTION Performed at [facility], [place] [address], [place], [place] [address]   Hepatitis panel, acute     Status: None (Preliminary result)   Collection Time: 01/05/19  4:14 AM  Result Value Ref Range   Hepatitis B Surface Ag NON REACTIVE NON REACTIVE    Comment: Performed at [place] [address] [place],  [address]   HCV Ab PENDING NON REACTIVE   Hep A IgM PENDING NON REACTIVE   Hep B C IgM PENDING NON REACTIVE  CBG monitoring, ED     Status: Abnormal   Collection Time: 01/05/19 12:09 PM  Result Value Ref Range   Glucose-Capillary 107 (H) 70 - 99 mg/dL   MR ABDOMEN MRCP WO CONTRAST  Result Date: 01/05/2019 CLINICAL  DATA:  Acute pancreatitis, nausea/vomiting/abdominal pain, ETOH EXAM: MRI ABDOMEN WITHOUT CONTRAST  (INCLUDING MRCP) TECHNIQUE: Multiplanar multisequence MR imaging of the abdomen was performed. Heavily T2-weighted images of the biliary and pancreatic ducts were obtained, and three-dimensional MRCP images were rendered by post processing. COMPARISON:  None. FINDINGS: Lower chest: Small left and trace right pleural effusions. Hepatobiliary: Mild hepatic steatosis. No morphologic findings of cirrhosis. No focal hepatic lesion is seen. Mild gallbladder sludge (series 11/image 62). No  cholelithiasis, gallbladder wall thickening, or inflammatory changes. Mild pericholecystic fluid/edema is likely related to the peripancreatic inflammatory process (described below). No intrahepatic or extrahepatic ductal dilatation. Common duct measures 4 mm. No choledocholithiasis is seen. Pancreas: Moderate peripancreatic fluid/ascites, likely reflecting acute pancreatitis. No pancreatic hemorrhage. Pancreatic necrosis cannot be assessed in the absence of intravenous contrast administration. No walled-off necrosis/pseudocyst. Spleen:  Within normal limits. Adrenals/Urinary Tract:  Adrenal glands are within normal limits. Kidneys are within normal limits, noting mild right perinephric fluid. No hydronephrosis. Stomach/Bowel: Stomach is within normal limits, noting mild fluid along the gastric cardia. Visualized bowel is unremarkable. Vascular/Lymphatic:  No evidence of abdominal aortic aneurysm. No suspicious abdominal lymphadenopathy. Other: Moderate upper abdominal ascites centered along the pancreas and right upper quadrant. Musculoskeletal: No focal osseous lesions. IMPRESSION: Acute pancreatitis with moderate abdominal ascites. No walled-off necrosis/pseudocyst. Mild gallbladder sludge, without associated inflammatory changes to suggest acute cholecystitis. No intrahepatic or extrahepatic ductal dilatation. No choledocholithiasis is seen. Electronically Signed   By: Julian Hy M.D.   On: 01/05/2019 12:16   MR 3D Recon At Scanner  Result Date: 01/05/2019 CLINICAL DATA:  Acute pancreatitis, nausea/vomiting/abdominal pain, ETOH EXAM: MRI ABDOMEN WITHOUT CONTRAST  (INCLUDING MRCP) TECHNIQUE: Multiplanar multisequence MR imaging of the abdomen was performed. Heavily T2-weighted images of the biliary and pancreatic ducts were obtained, and three-dimensional MRCP images were rendered by post processing. COMPARISON:  None. FINDINGS: Lower chest: Small left and trace right pleural effusions. Hepatobiliary:  Mild hepatic steatosis. No morphologic findings of cirrhosis. No focal hepatic lesion is seen. Mild gallbladder sludge (series 11/image 62). No cholelithiasis, gallbladder wall thickening, or inflammatory changes. Mild pericholecystic fluid/edema is likely related to the peripancreatic inflammatory process (described below). No intrahepatic or extrahepatic ductal dilatation. Common duct measures 4 mm. No choledocholithiasis is seen. Pancreas: Moderate peripancreatic fluid/ascites, likely reflecting acute pancreatitis. No pancreatic hemorrhage. Pancreatic necrosis cannot be assessed in the absence of intravenous contrast administration. No walled-off necrosis/pseudocyst. Spleen:  Within normal limits. Adrenals/Urinary Tract:  Adrenal glands are within normal limits. Kidneys are within normal limits, noting mild right perinephric fluid. No hydronephrosis. Stomach/Bowel: Stomach is within normal limits, noting mild fluid along the gastric cardia. Visualized bowel is unremarkable. Vascular/Lymphatic:  No evidence of abdominal aortic aneurysm. No suspicious abdominal lymphadenopathy. Other: Moderate upper abdominal ascites centered along the pancreas and right upper quadrant. Musculoskeletal: No focal osseous lesions. IMPRESSION: Acute pancreatitis with moderate abdominal ascites. No walled-off necrosis/pseudocyst. Mild gallbladder sludge, without associated inflammatory changes to suggest acute cholecystitis. No intrahepatic or extrahepatic ductal dilatation. No choledocholithiasis is seen. Electronically Signed   By: Julian Hy M.D.   On: 01/05/2019 12:16   RUQ [place]  Result Date: 01/04/2019 CLINICAL DATA:  Upper abdominal pain EXAM: ULTRASOUND ABDOMEN LIMITED RIGHT UPPER QUADRANT COMPARISON:  None. FINDINGS: Gallbladder: No gallstones or wall thickening visualized. There is slight pericholecystic fluid. No sonographic Murphy sign noted by sonographer. Common bile duct: Diameter: 5 mm. No intrahepatic or  extrahepatic biliary duct dilatation. Liver: No focal lesion identified. Liver echogenicity is increased. Portal  vein is patent on color Doppler imaging with normal direction of blood flow towards the liver. Other: None. IMPRESSION: 1. There is mild pericholecystic fluid. No gallbladder wall thickening or gallstones evident. Etiology for this pericholecystic fluid is uncertain. This is a finding that may be associated with early acute cholecystitis. In this regard, it may be prudent to consider nuclear medicine hepatobiliary imaging study to assess for cystic duct patency. 2. Increase in liver echogenicity, a finding indicative of hepatic steatosis. No focal liver lesions are evident. Electronically Signed   By: Lowella Grip III M.D.   On: 01/04/2019 16:18      Assessment/Plan Active Problems:   HTN (hypertension)   Lactic acidosis   Alcohol abuse   Abnormal LFTs   Acute gallstone pancreatitis   Pancreatitis  acute pancreatitis with SIRS - lipase down to 1642 AKI  - creatinine rising despite aggressive IVF resuscitation, up to 4.19 Hypocalcemia - 5.9 Low phos - 1.0 Hypomagnesemia  - 1.3 Above per medicine*  Elevated LFT's and Tbili - improving, ALT 538/AST 160, Tbili 1.5 - No biliary duct dilation nor cholelithiasis on [place] nor MRCP . MRCP does show gallbladder sludge and pancreatis with ascites / no necrosis - WBC WNL - GI following and labs pending to r/o autoimmune pancreatitis - h/o ETOH abuse but transaminases are not consistent with alcohol-related pancreatitis - HIDA scan pending - this does not fit the picture of gallstone pancreatitis but we will follow along  Thank you for the consult.    Kalman Drape, [facility] 01/05/2019, 4:13 PM Please see amion for pager for the following: Cristine Polio, & Friday 7:00am - 4:30pm Thursdays 7:00am -11:30am

## 2019-01-05 NOTE — ED Notes (Signed)
Pt provided new gown and washcloth.

## 2019-01-05 NOTE — Progress Notes (Signed)
Yolanda Flores is a 33 y.o. female with medical history for hypertension, GERD, alcohol abuse who presents with chief complaint of nausea vomiting abdominal pain that started Sunday.  Patient states that she was drinking on Sunday had a couple shots and subsequently she developed insidious abdominal pain she states the pain has been constant since Sunday and has been laying in bed due to the pain and is not able to sleep.  She states it hurts in the left upper quadrant and mid abdomen but does not radiate to the back.  Never had this type of pain before and states that she has been drinking shots previously.  Has been taking Protonix for epigastric pain and she also endorses chest discomfort while swallowing.  Because of these issues she came to the emergency room for further evaluation and she was found to have acute pancreatitis.  [facility] was asked to admit this patient for acute pancreatitis and gastroenterology was consulted for further evaluation recommendations  Subjective: Still having abdominal pain about 6-8 out of 10 on pain scale.  Denies any nausea vomiting.  Denies any fever, dysuria or hematuria.  Patient says she has been voiding every time she uses toilet.  Objective: Vital signs in last 24 hours: Temp:  [98.5 F (36.9 C)-99.2 F (37.3 C)] 98.5 F (36.9 C) (12/23 1558) Pulse Rate:  [80-101] 99 (12/23 1558) Resp:  [17-31] 20 (12/23 1558) BP: (113-138)/(86-112) 113/86 (12/23 1558) SpO2:  [95 %-100 %] 100 % (12/23 1558)  Intake/Output from previous day: 12/22 0701 - 12/23 0700 In: 5350.9 [I.V.:1350.9] Out: -  Intake/Output this shift: Total I/O In: 1000 [I.V.:1000] Out: -   Constitutional: WN/WD overweight female currently in mild distress and appears uncomfortable Eyes: Lids and conjunctivae normal, sclerae anicteric  ENMT: External Ears, Nose appear normal. Grossly normal hearing. Mucous membranes are dry Neck: Appears normal, supple, no cervical masses, normal ROM, no  appreciable thyromegaly Respiratory:  Clear to auscultation bilaterally, no wheezing, rales, rhonchi or crackles.   No tachypnea or accessory muscle use Cardiovascular: Tachycardic rate but regular rhythm, no murmurs / rubs / gallops. S1 and S2 auscultated.  Abdomen: Soft,Tender to palpate. Distended. Bowel sounds positive x4.  GU: Deferred. Musculoskeletal: No clubbing / cyanosis of digits/nails. No joint deformity upper and lower extremities.  Skin: No rashes, lesions, ulcers on a limited skin evaluation. No induration; Warm and dry.  Neurologic: CN 2-12 grossly intact with no focal deficits. Romberg sign cerebellar reflexes not assessed.  Psychiatric: Normal judgment and insight. Alert and oriented x 3. Normal mood and appropriate affect.   Results for orders placed or performed during the hospital encounter of 01/04/19 (from the past 24 hour(s))  CBC     Status: Abnormal   Collection Time: 01/04/19 10:08 PM  Result Value Ref Range   WBC 8.3 4.0 - 10.5 K/uL   RBC 3.65 (L) 3.87 - 5.11 MIL/uL   Hemoglobin 12.3 12.0 - 15.0 g/dL   HCT 35.1 (L) 36.0 - 46.0 %   MCV 96.2 80.0 - 100.0 fL   MCH 33.7 26.0 - 34.0 pg   MCHC 35.0 30.0 - 36.0 g/dL   RDW 14.1 11.5 - 15.5 %   Platelets 180 150 - 400 K/uL   nRBC 0.0 0.0 - 0.2 %  Magnesium     Status: Abnormal   Collection Time: 01/05/19  4:14 AM  Result Value Ref Range   Magnesium 1.3 (L) 1.7 - 2.4 mg/dL  Phosphorus     Status: Abnormal  Collection Time: 01/05/19  4:14 AM  Result Value Ref Range   Phosphorus 1.0 (LL) 2.5 - 4.6 mg/dL  Comprehensive metabolic panel     Status: Abnormal   Collection Time: 01/05/19  4:14 AM  Result Value Ref Range   Sodium 133 (L) 135 - 145 mmol/L   Potassium 3.9 3.5 - 5.1 mmol/L   Chloride 105 98 - 111 mmol/L   CO2 13 (L) 22 - 32 mmol/L   Glucose, Bld 106 (H) 70 - 99 mg/dL   BUN 43 (H) 6 - 20 mg/dL   Creatinine, Ser 4.19 (H) 0.44 - 1.00 mg/dL   Calcium 5.9 (LL) 8.9 - 10.3 mg/dL   Total Protein 6.1 (L) 6.5  - 8.1 g/dL   Albumin 3.0 (L) 3.5 - 5.0 g/dL   AST 538 (H) 15 - 41 U/L   ALT 160 (H) 0 - 44 U/L   Alkaline Phosphatase 85 38 - 126 U/L   Total Bilirubin 1.5 (H) 0.3 - 1.2 mg/dL   GFR calc non Af Amer 13 (L) >60 mL/min   GFR calc Af Amer 15 (L) >60 mL/min   Anion gap 15 5 - 15  CBC with Differential/Platelet     Status: Abnormal   Collection Time: 01/05/19  4:14 AM  Result Value Ref Range   WBC 7.4 4.0 - 10.5 K/uL   RBC 3.79 (L) 3.87 - 5.11 MIL/uL   Hemoglobin 12.4 12.0 - 15.0 g/dL   HCT 37.6 36.0 - 46.0 %   MCV 99.2 80.0 - 100.0 fL   MCH 32.7 26.0 - 34.0 pg   MCHC 33.0 30.0 - 36.0 g/dL   RDW 14.5 11.5 - 15.5 %   Platelets 176 150 - 400 K/uL   nRBC 0.0 0.0 - 0.2 %   Neutrophils Relative % 87 %   Neutro Abs 6.5 1.7 - 7.7 K/uL   Lymphocytes Relative 6 %   Lymphs Abs 0.4 (L) 0.7 - 4.0 K/uL   Monocytes Relative 5 %   Monocytes Absolute 0.3 0.1 - 1.0 K/uL   Eosinophils Relative 1 %   Eosinophils Absolute 0.1 0.0 - 0.5 K/uL   Basophils Relative 1 %   Basophils Absolute 0.1 0.0 - 0.1 K/uL   WBC Morphology MILD LEFT SHIFT (1-5% METAS, OCC MYELO, OCC BANDS)    Immature Granulocytes 0 %   Abs Immature Granulocytes 0.02 0.00 - 0.07 K/uL  TSH     Status: None   Collection Time: 01/05/19  4:14 AM  Result Value Ref Range   TSH 0.567 0.350 - 4.500 uIU/mL  Hemoglobin A1c     Status: None   Collection Time: 01/05/19  4:14 AM  Result Value Ref Range   Hgb A1c MFr Bld 4.9 4.8 - 5.6 %   Mean Plasma Glucose 93.93 mg/dL  Lipase, blood     Status: Abnormal   Collection Time: 01/05/19  4:14 AM  Result Value Ref Range   Lipase 1,642 (H) 11 - 51 U/L  Hepatitis panel, acute     Status: None   Collection Time: 01/05/19  4:14 AM  Result Value Ref Range   Hepatitis B Surface Ag NON REACTIVE NON REACTIVE   HCV Ab NON REACTIVE NON REACTIVE   Hep A IgM NON REACTIVE NON REACTIVE   Hep B C IgM NON REACTIVE NON REACTIVE  CBG monitoring, ED     Status: Abnormal   Collection Time: 01/05/19 12:09 PM   Result Value Ref Range   Glucose-Capillary 107 (H)  70 - 99 mg/dL  Acetaminophen level     Status: Abnormal   Collection Time: 01/05/19  4:37 PM  Result Value Ref Range   Acetaminophen (Tylenol), Serum <10 (L) 10 - 30 ug/mL    Studies/Results: MR ABDOMEN MRCP WO CONTRAST  Result Date: 01/05/2019 CLINICAL DATA:  Acute pancreatitis, nausea/vomiting/abdominal pain, ETOH EXAM: MRI ABDOMEN WITHOUT CONTRAST  (INCLUDING MRCP) TECHNIQUE: Multiplanar multisequence MR imaging of the abdomen was performed. Heavily T2-weighted images of the biliary and pancreatic ducts were obtained, and three-dimensional MRCP images were rendered by post processing. COMPARISON:  None. FINDINGS: Lower chest: Small left and trace right pleural effusions. Hepatobiliary: Mild hepatic steatosis. No morphologic findings of cirrhosis. No focal hepatic lesion is seen. Mild gallbladder sludge (series 11/image 62). No cholelithiasis, gallbladder wall thickening, or inflammatory changes. Mild pericholecystic fluid/edema is likely related to the peripancreatic inflammatory process (described below). No intrahepatic or extrahepatic ductal dilatation. Common duct measures 4 mm. No choledocholithiasis is seen. Pancreas: Moderate peripancreatic fluid/ascites, likely reflecting acute pancreatitis. No pancreatic hemorrhage. Pancreatic necrosis cannot be assessed in the absence of intravenous contrast administration. No walled-off necrosis/pseudocyst. Spleen:  Within normal limits. Adrenals/Urinary Tract:  Adrenal glands are within normal limits. Kidneys are within normal limits, noting mild right perinephric fluid. No hydronephrosis. Stomach/Bowel: Stomach is within normal limits, noting mild fluid along the gastric cardia. Visualized bowel is unremarkable. Vascular/Lymphatic:  No evidence of abdominal aortic aneurysm. No suspicious abdominal lymphadenopathy. Other: Moderate upper abdominal ascites centered along the pancreas and right upper  quadrant. Musculoskeletal: No focal osseous lesions. IMPRESSION: Acute pancreatitis with moderate abdominal ascites. No walled-off necrosis/pseudocyst. Mild gallbladder sludge, without associated inflammatory changes to suggest acute cholecystitis. No intrahepatic or extrahepatic ductal dilatation. No choledocholithiasis is seen. Electronically Signed   By: Julian Hy M.D.   On: 01/05/2019 12:16   MR 3D Recon At Scanner  Result Date: 01/05/2019 CLINICAL DATA:  Acute pancreatitis, nausea/vomiting/abdominal pain, ETOH EXAM: MRI ABDOMEN WITHOUT CONTRAST  (INCLUDING MRCP) TECHNIQUE: Multiplanar multisequence MR imaging of the abdomen was performed. Heavily T2-weighted images of the biliary and pancreatic ducts were obtained, and three-dimensional MRCP images were rendered by post processing. COMPARISON:  None. FINDINGS: Lower chest: Small left and trace right pleural effusions. Hepatobiliary: Mild hepatic steatosis. No morphologic findings of cirrhosis. No focal hepatic lesion is seen. Mild gallbladder sludge (series 11/image 62). No cholelithiasis, gallbladder wall thickening, or inflammatory changes. Mild pericholecystic fluid/edema is likely related to the peripancreatic inflammatory process (described below). No intrahepatic or extrahepatic ductal dilatation. Common duct measures 4 mm. No choledocholithiasis is seen. Pancreas: Moderate peripancreatic fluid/ascites, likely reflecting acute pancreatitis. No pancreatic hemorrhage. Pancreatic necrosis cannot be assessed in the absence of intravenous contrast administration. No walled-off necrosis/pseudocyst. Spleen:  Within normal limits. Adrenals/Urinary Tract:  Adrenal glands are within normal limits. Kidneys are within normal limits, noting mild right perinephric fluid. No hydronephrosis. Stomach/Bowel: Stomach is within normal limits, noting mild fluid along the gastric cardia. Visualized bowel is unremarkable. Vascular/Lymphatic:  No evidence of  abdominal aortic aneurysm. No suspicious abdominal lymphadenopathy. Other: Moderate upper abdominal ascites centered along the pancreas and right upper quadrant. Musculoskeletal: No focal osseous lesions. IMPRESSION: Acute pancreatitis with moderate abdominal ascites. No walled-off necrosis/pseudocyst. Mild gallbladder sludge, without associated inflammatory changes to suggest acute cholecystitis. No intrahepatic or extrahepatic ductal dilatation. No choledocholithiasis is seen. Electronically Signed   By: Julian Hy M.D.   On: 01/05/2019 12:16   RUQ [place]  Result Date: 01/04/2019 CLINICAL DATA:  Upper abdominal pain EXAM: ULTRASOUND ABDOMEN  LIMITED RIGHT UPPER QUADRANT COMPARISON:  None. FINDINGS: Gallbladder: No gallstones or wall thickening visualized. There is slight pericholecystic fluid. No sonographic Murphy sign noted by sonographer. Common bile duct: Diameter: 5 mm. No intrahepatic or extrahepatic biliary duct dilatation. Liver: No focal lesion identified. Liver echogenicity is increased. Portal vein is patent on color Doppler imaging with normal direction of blood flow towards the liver. Other: None. IMPRESSION: 1. There is mild pericholecystic fluid. No gallbladder wall thickening or gallstones evident. Etiology for this pericholecystic fluid is uncertain. This is a finding that may be associated with early acute cholecystitis. In this regard, it may be prudent to consider nuclear medicine hepatobiliary imaging study to assess for cystic duct patency. 2. Increase in liver echogenicity, a finding indicative of hepatic steatosis. No focal liver lesions are evident. Electronically Signed   By: Lowella Grip III M.D.   On: 01/04/2019 16:18    Scheduled Meds: . folic acid  1 mg Oral Daily  . heparin  5,000 Units Subcutaneous Q8H  . LORazepam  0-4 mg Intravenous Q6H   Followed by  . [START ON 01/06/2019] LORazepam  0-4 mg Intravenous Q12H  . metoprolol tartrate  25 mg Oral BID  .  multivitamin with minerals  1 tablet Oral Daily  . pantoprazole  40 mg Oral Q1200  . thiamine  100 mg Oral Daily   Or  . thiamine  100 mg Intravenous Daily   Continuous Infusions: . sodium bicarbonate 150 mEq in dextrose 5% 1000 mL 150 mEq (01/05/19 1644)   PRN Meds:bisacodyl, LORazepam **OR** LORazepam, menthol-cetylpyridinium, morphine injection, ondansetron **OR** ondansetron (ZOFRAN) IV, oxyCODONE, phenol, polyethylene glycol  Assessment/Plan: Assessment/Plan Active Problems:   HTN (hypertension)   Lactic acidosis   Alcohol abuse   Abnormal LFTs   Acute gallstone pancreatitis   Pancreatitis  SIRS from Acute Pancreatitis: Improving -On admission Presented with tachypnea, tachycardia, leukocytosis and a pancreatitis -Unclear Etiology of her pancreatitis but could be biliary versus alcohol induced versus drug intoxication versus shock liver versus CBD stone -Patient's lipase came down to 1600 from over 4000 ; while LFTs also shown improvement as AST came down to 1600 from 4000 and ALT came down to 538 from 1600 -Continue n.p.o. 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 Acute pancreatitis with moderate abdominal ascites. No walled-off necrosis/pseudocyst. There is no biliary dilatation.  No choledocholithiasis. -HIDA scan pending -Continue to monitor and follow lipase levels and LFTs -Appreciate GI recs - Avoid hepatotoxic agents -We will monitor patient in the stepdown unit for now  Hyponatremia/Hypocholremia: Improving -Again likely from dehydration -Patient sodium is 133 and chloride level of 105 day after  admission -Continue IV fluid hydration as above  Lactic Acidosis: On admission -Patient's lactic acid level on admission was 2.4 -Could be from dehydration or SIRS -IV fluid hydration as above -Continue to monitor and trend and may monitor  AKI  Elevated anion gap metabolic acidosis: Improving slowly -in the setting of nausea vomiting and dehydration from pancreatitis -Avoid nephrotoxic medications and will hold her lisinopril -Patient's CO2 on admission was 19, chloride was 92, and anion gap was 20; changed to 13, 105 and 15 respectively -Continue IV fluid hydration with 3  A of sodium bicarb in D5 at 125 cc/h - renal ultrasound -Avoid nephrotoxic medications, contrast dyes, hypotension and renally adjust medications -Check urine lites and UA -Monitor urine output if not adequate may insert a Foley -Consulted nephrology.  Will follow the recommendation.  And appreciate the rec -Continue to monitor and trend renal function next-repeat CMP in a.m.  Hyperglycemia: Improved -Patient blood sugar on admission was 139 next -Check hemoglobin A1c found to be 4.9 -Continue to monitor  Abnormal LFTs: Trending down -Patient's AST was 1669 and ALT was 285 which went up to 4000 and 1600 on the following day -MRCP as above -HIDA scan pending -Avoid hepatotoxic agents -Check serum Tylenol level, UDS may also check hepatitis panel -Continue to monitor and trend LFTs -Appreciate GI recs -Repeat CMP in a.m.  Essential Hypertension: Improving -Was elevated on admission at 163/126 -We will place on IV hydralazine  -Continue metoprolol if allowed to have p.o. intake  Alcohol abuse -Patient drinks at least 3 times a week -We will place on CIWA protocol and continue with folic acid, multivitamin and thiamine -Counseling on cessation  Electrolyte imbalance: Found to be hypomagnesemic and hypophosphatemia -Replenishment and recheck  GERD -PPI  DVT prophylaxis: Heparin  Code  Status: FULL  Family Communication: No family present  Disposition Plan: Pending improvement and back to baseline  Consults called: Gastroenterology , neurology    LOS: 1 day   Thornell Mule

## 2019-01-05 NOTE — Progress Notes (Signed)
Progress Note    ASSESSMENT AND PLAN:    55. 33 yo female with acute pancreatitis with SIRS. Completed aggressive IV fluid resuscitation. Her hematocrit is 37% but with worsening renal function 3.3 >>> 4.19. Still evaluating for etiology of pancreatitis. Liver tests raised concern for biliary etiology. No biliary duct dilation nor cholelithiasis on [place] nor MRCP . MRCP does show gallbladder sludge and pancreatis with ascites / no necrosis. WBC has normalized. Liver tests improving. Passed stone or sludge? -continue supportive care. TRH has already cut back on IV fluids.  -NPO, still having some LUQ abdominal pain   2. Electrolyte abnormalities. --Total Ca+ 6.9 , probably unrelated to pancreatitis, albumin is low --Phos low at 1.0, Mg+ low at 1.3 --NA+ 133  3. Burning in throat. Asking to resume carafate --Will order carafate but only BID given renal function.    4. Progressive AKI,  --continue IVF, avoid Nephrotoxic meds, consult Nephrology is not improving.    SUBJECTIVE    Just asked for pain meds for stomach. Still having burning in throat / chest when swallows, wants carafate   OBJECTIVE:     Vital signs in last 24 hours: Temp:  [99.2 F (37.3 C)] 99.2 F (37.3 C) (12/22 2040) Pulse Rate:  [80-123] 80 (12/23 1000) Resp:  [16-32] 20 (12/23 1000) BP: (116-163)/(88-126) 128/88 (12/23 1000) SpO2:  [95 %-100 %] 98 % (12/23 1000)   General:   Alert, well-developed female in NAD EENT:  Normal hearing, non icteric sclera   Heart:  Regular rate and rhythm;  No lower extremity edema   Pulm: Normal respiratory effort   Abdomen:  Soft, nondistended, mod tender in LUQ.  Normal bowel sounds.          Neurologic:  Alert and  oriented x4;  grossly normal neurologically. Psych:  Pleasant, cooperative.  Normal mood and affect.   Intake/Output from previous day: 12/22 0701 - 12/23 0700 In: 5350.9 [I.V.:1350.9; IV Piggyback:4000] Out: -  Intake/Output this shift: No  intake/output data recorded.  Lab Results: Recent Labs    01/04/19 0943 01/04/19 2208 01/05/19 0414  WBC 16.4* 8.3 7.4  HGB 14.6 12.3 12.4  HCT 42.0 35.1* 37.6  PLT 333 180 176   BMET Recent Labs    01/04/19 1041 01/05/19 0414  NA 131* 133*  K 4.4 3.9  CL 92* 105  CO2 19* 13*  GLUCOSE 139* 106*  BUN 36* 43*  CREATININE 3.30* 4.19*  CALCIUM 7.1* 5.9*   LFT Recent Labs    01/05/19 0414  PROT 6.1*  ALBUMIN 3.0*  AST 538*  ALT 160*  ALKPHOS 85  BILITOT 1.5*    MR ABDOMEN MRCP WO CONTRAST  Result Date: 01/05/2019 CLINICAL DATA:  Acute pancreatitis, nausea/vomiting/abdominal pain, ETOH EXAM: MRI ABDOMEN WITHOUT CONTRAST  (INCLUDING MRCP) TECHNIQUE: Multiplanar multisequence MR imaging of the abdomen was performed. Heavily T2-weighted images of the biliary and pancreatic ducts were obtained, and three-dimensional MRCP images were rendered by post processing. COMPARISON:  None. FINDINGS: Lower chest: Small left and trace right pleural effusions. Hepatobiliary: Mild hepatic steatosis. No morphologic findings of cirrhosis. No focal hepatic lesion is seen. Mild gallbladder sludge (series 11/image 62). No cholelithiasis, gallbladder wall thickening, or inflammatory changes. Mild pericholecystic fluid/edema is likely related to the peripancreatic inflammatory process (described below). No intrahepatic or extrahepatic ductal dilatation. Common duct measures 4 mm. No choledocholithiasis is seen. Pancreas: Moderate peripancreatic fluid/ascites, likely reflecting acute pancreatitis. No pancreatic hemorrhage. Pancreatic necrosis cannot be assessed in  the absence of intravenous contrast administration. No walled-off necrosis/pseudocyst. Spleen:  Within normal limits. Adrenals/Urinary Tract:  Adrenal glands are within normal limits. Kidneys are within normal limits, noting mild right perinephric fluid. No hydronephrosis. Stomach/Bowel: Stomach is within normal limits, noting mild fluid along  the gastric cardia. Visualized bowel is unremarkable. Vascular/Lymphatic:  No evidence of abdominal aortic aneurysm. No suspicious abdominal lymphadenopathy. Other: Moderate upper abdominal ascites centered along the pancreas and right upper quadrant. Musculoskeletal: No focal osseous lesions. IMPRESSION: Acute pancreatitis with moderate abdominal ascites. No walled-off necrosis/pseudocyst. Mild gallbladder sludge, without associated inflammatory changes to suggest acute cholecystitis. No intrahepatic or extrahepatic ductal dilatation. No choledocholithiasis is seen. Electronically Signed   By: Julian Hy M.D.   On: 01/05/2019 12:16   MR 3D Recon At Scanner  Result Date: 01/05/2019 CLINICAL DATA:  Acute pancreatitis, nausea/vomiting/abdominal pain, ETOH EXAM: MRI ABDOMEN WITHOUT CONTRAST  (INCLUDING MRCP) TECHNIQUE: Multiplanar multisequence MR imaging of the abdomen was performed. Heavily T2-weighted images of the biliary and pancreatic ducts were obtained, and three-dimensional MRCP images were rendered by post processing. COMPARISON:  None. FINDINGS: Lower chest: Small left and trace right pleural effusions. Hepatobiliary: Mild hepatic steatosis. No morphologic findings of cirrhosis. No focal hepatic lesion is seen. Mild gallbladder sludge (series 11/image 62). No cholelithiasis, gallbladder wall thickening, or inflammatory changes. Mild pericholecystic fluid/edema is likely related to the peripancreatic inflammatory process (described below). No intrahepatic or extrahepatic ductal dilatation. Common duct measures 4 mm. No choledocholithiasis is seen. Pancreas: Moderate peripancreatic fluid/ascites, likely reflecting acute pancreatitis. No pancreatic hemorrhage. Pancreatic necrosis cannot be assessed in the absence of intravenous contrast administration. No walled-off necrosis/pseudocyst. Spleen:  Within normal limits. Adrenals/Urinary Tract:  Adrenal glands are within normal limits. Kidneys are  within normal limits, noting mild right perinephric fluid. No hydronephrosis. Stomach/Bowel: Stomach is within normal limits, noting mild fluid along the gastric cardia. Visualized bowel is unremarkable. Vascular/Lymphatic:  No evidence of abdominal aortic aneurysm. No suspicious abdominal lymphadenopathy. Other: Moderate upper abdominal ascites centered along the pancreas and right upper quadrant. Musculoskeletal: No focal osseous lesions. IMPRESSION: Acute pancreatitis with moderate abdominal ascites. No walled-off necrosis/pseudocyst. Mild gallbladder sludge, without associated inflammatory changes to suggest acute cholecystitis. No intrahepatic or extrahepatic ductal dilatation. No choledocholithiasis is seen. Electronically Signed   By: Julian Hy M.D.   On: 01/05/2019 12:16   RUQ [place]  Result Date: 01/04/2019 CLINICAL DATA:  Upper abdominal pain EXAM: ULTRASOUND ABDOMEN LIMITED RIGHT UPPER QUADRANT COMPARISON:  None. FINDINGS: Gallbladder: No gallstones or wall thickening visualized. There is slight pericholecystic fluid. No sonographic Murphy sign noted by sonographer. Common bile duct: Diameter: 5 mm. No intrahepatic or extrahepatic biliary duct dilatation. Liver: No focal lesion identified. Liver echogenicity is increased. Portal vein is patent on color Doppler imaging with normal direction of blood flow towards the liver. Other: None. IMPRESSION: 1. There is mild pericholecystic fluid. No gallbladder wall thickening or gallstones evident. Etiology for this pericholecystic fluid is uncertain. This is a finding that may be associated with early acute cholecystitis. In this regard, it may be prudent to consider nuclear medicine hepatobiliary imaging study to assess for cystic duct patency. 2. Increase in liver echogenicity, a finding indicative of hepatic steatosis. No focal liver lesions are evident. Electronically Signed   By: Lowella Grip III M.D.   On: 01/04/2019 16:18     Active  Problems:   HTN (hypertension)   Lactic acidosis   Alcohol abuse   Abnormal LFTs   Acute gallstone pancreatitis  Pancreatitis     LOS: 1 day   Tye Savoy ,NP 01/05/2019, 12:32 PM

## 2019-01-05 NOTE — ED Notes (Signed)
Pt provided urine specimen cup, pt denied feeling of voiding at this time, will monitor.

## 2019-01-06 ENCOUNTER — Inpatient Hospital Stay (HOSPITAL_COMMUNITY): Payer: BLUE CROSS/BLUE SHIELD

## 2019-01-06 DIAGNOSIS — E871 Hypo-osmolality and hyponatremia: Secondary | ICD-10-CM

## 2019-01-06 DIAGNOSIS — K859 Acute pancreatitis without necrosis or infection, unspecified: Secondary | ICD-10-CM

## 2019-01-06 DIAGNOSIS — E876 Hypokalemia: Secondary | ICD-10-CM

## 2019-01-06 DIAGNOSIS — N179 Acute kidney failure, unspecified: Secondary | ICD-10-CM

## 2019-01-06 LAB — COMPREHENSIVE METABOLIC PANEL
ALT: 90 U/L — ABNORMAL HIGH (ref 0–44)
AST: 185 U/L — ABNORMAL HIGH (ref 15–41)
Albumin: 2.6 g/dL — ABNORMAL LOW (ref 3.5–5.0)
Alkaline Phosphatase: 96 U/L (ref 38–126)
Anion gap: 15 (ref 5–15)
BUN: 58 mg/dL — ABNORMAL HIGH (ref 6–20)
CO2: 22 mmol/L (ref 22–32)
Calcium: 5.4 mg/dL — CL (ref 8.9–10.3)
Chloride: 97 mmol/L — ABNORMAL LOW (ref 98–111)
Creatinine, Ser: 6.75 mg/dL — ABNORMAL HIGH (ref 0.44–1.00)
GFR calc Af Amer: 9 mL/min — ABNORMAL LOW (ref >60)
GFR calc non Af Amer: 7 mL/min — ABNORMAL LOW (ref >60)
Glucose, Bld: 165 mg/dL — ABNORMAL HIGH (ref 70–99)
Potassium: 3.2 mmol/L — ABNORMAL LOW (ref 3.5–5.1)
Sodium: 134 mmol/L — ABNORMAL LOW (ref 135–145)
Total Bilirubin: 0.9 mg/dL (ref 0.3–1.2)
Total Protein: 5.6 g/dL — ABNORMAL LOW (ref 6.5–8.1)

## 2019-01-06 LAB — MRSA PCR SCREENING: MRSA by PCR: NEGATIVE

## 2019-01-06 LAB — CBC WITH DIFFERENTIAL/PLATELET
Abs Immature Granulocytes: 0.04 10*3/uL (ref 0.00–0.07)
Basophils Absolute: 0 10*3/uL (ref 0.0–0.1)
Basophils Relative: 0 %
Eosinophils Absolute: 0.2 10*3/uL (ref 0.0–0.5)
Eosinophils Relative: 2 %
HCT: 28.9 % — ABNORMAL LOW (ref 36.0–46.0)
Hemoglobin: 10.1 g/dL — ABNORMAL LOW (ref 12.0–15.0)
Immature Granulocytes: 0 %
Lymphocytes Relative: 5 %
Lymphs Abs: 0.5 10*3/uL — ABNORMAL LOW (ref 0.7–4.0)
MCH: 33.3 pg (ref 26.0–34.0)
MCHC: 34.9 g/dL (ref 30.0–36.0)
MCV: 95.4 fL (ref 80.0–100.0)
Monocytes Absolute: 0.4 10*3/uL (ref 0.1–1.0)
Monocytes Relative: 5 %
Neutro Abs: 8 10*3/uL — ABNORMAL HIGH (ref 1.7–7.7)
Neutrophils Relative %: 88 %
Platelets: 146 10*3/uL — ABNORMAL LOW (ref 150–400)
RBC: 3.03 MIL/uL — ABNORMAL LOW (ref 3.87–5.11)
RDW: 14.3 % (ref 11.5–15.5)
WBC: 9.2 10*3/uL (ref 4.0–10.5)
nRBC: 0 % (ref 0.0–0.2)

## 2019-01-06 LAB — GLUCOSE, CAPILLARY: Glucose-Capillary: 155 mg/dL — ABNORMAL HIGH (ref 70–99)

## 2019-01-06 LAB — LIPASE, BLOOD: Lipase: 622 U/L — ABNORMAL HIGH (ref 11–51)

## 2019-01-06 MED ORDER — POTASSIUM CHLORIDE 10 MEQ/100ML IV SOLN
10.0000 meq | INTRAVENOUS | Status: AC
  Administered 2019-01-06 (×3): 10 meq via INTRAVENOUS
  Filled 2019-01-06 (×4): qty 100

## 2019-01-06 MED ORDER — POTASSIUM CHLORIDE 10 MEQ/100ML IV SOLN: 10.0000 meq | INTRAVENOUS | Status: DC

## 2019-01-06 MED ORDER — SUCRALFATE 1 GM/10ML PO SUSP
1.0000 g | Freq: Three times a day (TID) | ORAL | Status: DC
  Administered 2019-01-06 – 2019-01-07 (×3): 1 g via ORAL
  Filled 2019-01-06 (×3): qty 10

## 2019-01-06 MED ORDER — SODIUM CHLORIDE 0.9 % IV SOLN: INTRAVENOUS | Status: DC

## 2019-01-06 MED ORDER — LACTATED RINGERS IV SOLN: INTRAVENOUS | Status: DC

## 2019-01-06 MED ORDER — POTASSIUM CHLORIDE 10 MEQ/100ML IV SOLN
INTRAVENOUS | Status: AC
  Administered 2019-01-06: 21:00:00 10 meq via INTRAVENOUS
  Filled 2019-01-06: qty 100

## 2019-01-06 MED ORDER — CALCIUM GLUCONATE-NACL 2-0.675 GM/100ML-% IV SOLN
2.0000 g | Freq: Once | INTRAVENOUS | Status: AC
  Administered 2019-01-06: 16:00:00 2000 mg via INTRAVENOUS
  Filled 2019-01-06: qty 100

## 2019-01-06 NOTE — Progress Notes (Addendum)
Progress Note    ASSESSMENT AND PLAN:     37. 33 yo female with acute pancreatitis with SIRS. Completed aggressive IV fluid resuscitation with multiple IV bolus in ED and ongoing fluids at 125 ml / hr now. Pain improving. Still evaluating for etiology of pancreatitis. Etoh could be contributing factor but doesn't typically cause just a dramatic AST. Lisinopril has been associated with pancreatitis but doesn't explain such dramatic liver tests abnormalities. There has been  concern for biliary etiology but no biliary duct dilation nor cholelithiasis on [place] nor MRCP . MRCP did show gallbladder sludge, pancreatitis with ascites / no necrosis. WBC has normalized. ---Hct 28%  ---Liver tests markedly improved. Lipase 622 ---Appreciate Surgery's input - no role for surgery.  ---Continue supportive care. Hopefully trial of clears tomorrow. ---Am liver tests   2. Worsening AKI. Cr 3.3 >>> 4.19 >> 6.75.   ----Recommend Nephrology consult.  3. Electrolyte abnormalities felt to be secondary to dehydration. Marland Kitchen ---Repleted by TRH  4. Burning in throat. Better after resuming Carafate --Continue Carafate but only BID given renal function.        SUBJECTIVE    Pain improving. No specific complaints   OBJECTIVE:     Vital signs in last 24 hours: Temp:  [98 F (36.7 C)-99.7 F (37.6 C)] 99.7 F (37.6 C) (12/24 0745) Pulse Rate:  [80-99] 96 (12/24 0600) Resp:  [20-28] 26 (12/24 0600) BP: (91-133)/(60-104) 91/60 (12/24 0600) SpO2:  [98 %-100 %] 98 % (12/24 0600) Weight:  [76.3 kg] 76.3 kg (12/23 1800) Last BM Date: 01/05/19 General:   Alert, well-developed female in NAD EENT:  Normal hearing, non icteric sclera   Heart:  Regular rate and rhythm;  No lower extremity edema   Pulm: Normal respiratory effort   Abdomen:  Soft, mildly distended,  mderate LUQ tenderness, hypoactive bs.            Neurologic:  Alert and  oriented x4;  grossly normal neurologically. Psych:  Pleasant,  cooperative.  Normal mood and affect.   Intake/Output from previous day: 12/23 0701 - 12/24 0700 In: 2585.3 [I.V.:2332.3; IV Piggyback:253] Out: 175 [Urine:175] Intake/Output this shift: No intake/output data recorded.  Lab Results: Recent Labs    01/04/19 2208 01/05/19 0414 01/06/19 0854  WBC 8.3 7.4 9.2  HGB 12.3 12.4 10.1*  HCT 35.1* 37.6 28.9*  PLT 180 176 146*   BMET Recent Labs    01/04/19 1041 01/05/19 0414  NA 131* 133*  K 4.4 3.9  CL 92* 105  CO2 19* 13*  GLUCOSE 139* 106*  BUN 36* 43*  CREATININE 3.30* 4.19*  CALCIUM 7.1* 5.9*   LFT Recent Labs    01/05/19 0414  PROT 6.1*  ALBUMIN 3.0*  AST 538*  ALT 160*  ALKPHOS 85  BILITOT 1.5*   PT/INR No results for input(s): LABPROT, INR in the last 72 hours. Hepatitis Panel Recent Labs    01/05/19 0414  HEPBSAG NON REACTIVE  HCVAB NON REACTIVE  HEPAIGM NON REACTIVE  HEPBIGM NON REACTIVE    US RENAL  Result Date: 01/05/2019 CLINICAL DATA:  Acute kidney insufficiency. Nausea and vomiting. Dehydration. EXAM: RENAL / URINARY TRACT ULTRASOUND COMPLETE COMPARISON:  None. FINDINGS: Right Kidney: Renal measurements: 12.5 x 6.3 x 5.5 cm = volume: 230 mL. The parenchyma is diffusely echogenic. There is no stone or mass lesion. No hydronephrosis is present. Left Kidney: Renal measurements: 12.5 x 5.7 x 5.1 cm = volume: 188 mL. The parenchyma is diffusely  echogenic. There is no stone or mass lesion. No hydronephrosis is present. Bladder: Appears normal for degree of bladder distention. Other: Small amount of ascites is present. Perinephric fluid is evident bilaterally. IMPRESSION: 1. Echogenic kidneys bilaterally. This is nonspecific, but can be seen in the setting of medical renal disease. No obstructive disease is present. 2. Abdominal ascites. Electronically Signed   By: San Morelle M.D.   On: 01/05/2019 19:03   MR ABDOMEN MRCP WO CONTRAST  Result Date: 01/05/2019 CLINICAL DATA:  Acute pancreatitis,  nausea/vomiting/abdominal pain, ETOH EXAM: MRI ABDOMEN WITHOUT CONTRAST  (INCLUDING MRCP) TECHNIQUE: Multiplanar multisequence MR imaging of the abdomen was performed. Heavily T2-weighted images of the biliary and pancreatic ducts were obtained, and three-dimensional MRCP images were rendered by post processing. COMPARISON:  None. FINDINGS: Lower chest: Small left and trace right pleural effusions. Hepatobiliary: Mild hepatic steatosis. No morphologic findings of cirrhosis. No focal hepatic lesion is seen. Mild gallbladder sludge (series 11/image 62). No cholelithiasis, gallbladder wall thickening, or inflammatory changes. Mild pericholecystic fluid/edema is likely related to the peripancreatic inflammatory process (described below). No intrahepatic or extrahepatic ductal dilatation. Common duct measures 4 mm. No choledocholithiasis is seen. Pancreas: Moderate peripancreatic fluid/ascites, likely reflecting acute pancreatitis. No pancreatic hemorrhage. Pancreatic necrosis cannot be assessed in the absence of intravenous contrast administration. No walled-off necrosis/pseudocyst. Spleen:  Within normal limits. Adrenals/Urinary Tract:  Adrenal glands are within normal limits. Kidneys are within normal limits, noting mild right perinephric fluid. No hydronephrosis. Stomach/Bowel: Stomach is within normal limits, noting mild fluid along the gastric cardia. Visualized bowel is unremarkable. Vascular/Lymphatic:  No evidence of abdominal aortic aneurysm. No suspicious abdominal lymphadenopathy. Other: Moderate upper abdominal ascites centered along the pancreas and right upper quadrant. Musculoskeletal: No focal osseous lesions. IMPRESSION: Acute pancreatitis with moderate abdominal ascites. No walled-off necrosis/pseudocyst. Mild gallbladder sludge, without associated inflammatory changes to suggest acute cholecystitis. No intrahepatic or extrahepatic ductal dilatation. No choledocholithiasis is seen. Electronically  Signed   By: Julian Hy M.D.   On: 01/05/2019 12:16   MR 3D Recon At Scanner  Result Date: 01/05/2019 CLINICAL DATA:  Acute pancreatitis, nausea/vomiting/abdominal pain, ETOH EXAM: MRI ABDOMEN WITHOUT CONTRAST  (INCLUDING MRCP) TECHNIQUE: Multiplanar multisequence MR imaging of the abdomen was performed. Heavily T2-weighted images of the biliary and pancreatic ducts were obtained, and three-dimensional MRCP images were rendered by post processing. COMPARISON:  None. FINDINGS: Lower chest: Small left and trace right pleural effusions. Hepatobiliary: Mild hepatic steatosis. No morphologic findings of cirrhosis. No focal hepatic lesion is seen. Mild gallbladder sludge (series 11/image 62). No cholelithiasis, gallbladder wall thickening, or inflammatory changes. Mild pericholecystic fluid/edema is likely related to the peripancreatic inflammatory process (described below). No intrahepatic or extrahepatic ductal dilatation. Common duct measures 4 mm. No choledocholithiasis is seen. Pancreas: Moderate peripancreatic fluid/ascites, likely reflecting acute pancreatitis. No pancreatic hemorrhage. Pancreatic necrosis cannot be assessed in the absence of intravenous contrast administration. No walled-off necrosis/pseudocyst. Spleen:  Within normal limits. Adrenals/Urinary Tract:  Adrenal glands are within normal limits. Kidneys are within normal limits, noting mild right perinephric fluid. No hydronephrosis. Stomach/Bowel: Stomach is within normal limits, noting mild fluid along the gastric cardia. Visualized bowel is unremarkable. Vascular/Lymphatic:  No evidence of abdominal aortic aneurysm. No suspicious abdominal lymphadenopathy. Other: Moderate upper abdominal ascites centered along the pancreas and right upper quadrant. Musculoskeletal: No focal osseous lesions. IMPRESSION: Acute pancreatitis with moderate abdominal ascites. No walled-off necrosis/pseudocyst. Mild gallbladder sludge, without associated  inflammatory changes to suggest acute cholecystitis. No intrahepatic or extrahepatic ductal dilatation.  No choledocholithiasis is seen. Electronically Signed   By: Julian Hy M.D.   On: 01/05/2019 12:16   RUQ [place]  Result Date: 01/04/2019 CLINICAL DATA:  Upper abdominal pain EXAM: ULTRASOUND ABDOMEN LIMITED RIGHT UPPER QUADRANT COMPARISON:  None. FINDINGS: Gallbladder: No gallstones or wall thickening visualized. There is slight pericholecystic fluid. No sonographic Murphy sign noted by sonographer. Common bile duct: Diameter: 5 mm. No intrahepatic or extrahepatic biliary duct dilatation. Liver: No focal lesion identified. Liver echogenicity is increased. Portal vein is patent on color Doppler imaging with normal direction of blood flow towards the liver. Other: None. IMPRESSION: 1. There is mild pericholecystic fluid. No gallbladder wall thickening or gallstones evident. Etiology for this pericholecystic fluid is uncertain. This is a finding that may be associated with early acute cholecystitis. In this regard, it may be prudent to consider nuclear medicine hepatobiliary imaging study to assess for cystic duct patency. 2. Increase in liver echogenicity, a finding indicative of hepatic steatosis. No focal liver lesions are evident. Electronically Signed   By: Lowella Grip III M.D.   On: 01/04/2019 16:18    Active Problems:   HTN (hypertension)   Lactic acidosis   Alcohol abuse   Abnormal LFTs   Acute gallstone pancreatitis   Pancreatitis     LOS: 2 days   Tye Savoy ,NP 01/06/2019, 9:45 AM   Attending physician's note   I have taken an interval history, reviewed the chart and examined the patient. I agree with the Advanced Practitioner's note, impression and recommendations.   Acute pancreatitis likely d/t ETOH with SIRS and acute peripancreatic fluid collection.  Necrosis could not be assessed d/t lack of IV contrast on MRCP. No cholelithiasis/CBD dilatation. LFTs trending  down. Worsening acute anuric renal failure.   ETOH abuse.  Covid 19 - negative  Plan: -Clear liquid diet.  Advance gradually to full liquid diet if tolerated. -Ambulate -Appreciate renal consultation by Dr Justin Mend.  -Monitor and correct electrolytes. -No GI intervention planned. -She has assured me that she will stop drinking all alcohol. -We will follow along in the next 1 to 2 days.  Please call my cell phone listed below for any questions or concerns.  Carmell Austria, MD Velora Heckler Fabienne Bruns [phone].

## 2019-01-06 NOTE — Consult Note (Addendum)
NAME:  Yolanda Flores, MRN:  [id number], DOB:  October 21, 1985, LOS: 2 ADMISSION DATE:  01/04/2019, CONSULTATION DATE:  12/24 REFERRING MD:  Justin Mend, CHIEF COMPLAINT:  Renal failure/SIRS/pancreatitis    Brief History   33 year old female with history of hypertension and EtOH abuse admitted on 12/22 with nausea vomiting, and abdominal pain.  Found to have acute pancreatitis, elevated LFTs and acute kidney injury.  Treated with IV hydration and bowel rest.  LFTs and lipase all improving however on 12/24 in spite of supportive care serum creatinine continues to rise.  Critical care asked to evaluate given concern for possible need for dialysis  History of present illness   This is a 33 year old female who was admitted on 12/22 with chief complaint of nausea and vomiting and abdominal pain.  This was first noted on 12/20, primarily involving the epigastric and left upper quadrant without radiation.  As noted was associated with frequent amounts of nausea and vomiting.  Also reported having discomfort with swallowing.  ER evaluation was notable for AKI, elevated LFTs AST 1669, ALT 285, T bili 2.6, elevated lipase 4101, ultrasound of abdomen was negative for gallstones or gallbladder wall thickening.  She was admitted with working diagnosis of acute  pancreatitis she was seen in consultation by GI medicine, surgery, and nephrology.  She was hypotensive meeting SIRS parameters and started on IV hydration Hospital course: 12/23 LFTs improving AST 538, ALT 160, T bili 1.5, lipase decline from 4101 to 1642.  Ultrasound was negative for cholelithiasis he underwent MRCP which showed gallbladder sludge with associated inflammatory changes with pancreatitis and associated ascites there was no biliary dilation or choledocholithiasis GI noted clinical picture not completely c/w alcohol related pancreatitis.  They question the etiology of pancreatitis specifically could this have been drug-induced?  Or possibly prior stone.   Also question the possibility of autoimmune pancreatitis as an additional contributing factor.  Started on bicarbonate infusion for worsening acidosis 12/24: Creatinine continuing to rise had been 3.3 on admission discontinued to increase.  From time of admission she had received 10 L of fluid, serum creatinine now up to 4.19. Lipase down to 622,  AST 185, ALT 90, Tbili 0.9 Nephrology was consulted and due to concern that she may ultimately require dialysis critical care was asked to evaluate    Past Medical History  Hypertension on lisinopril, tobacco abuse, alcohol consumption, Endorses drinking at least 3 times a week.  Was hospitalized in May 2020 for alcohol withdrawal.  Has history of elevated LFTs in the setting of alcohol.  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  Ultrasound was negative for cholelithiasis he underwent MRCP which showed gallbladder sludge with associated inflammatory changes with pancreatitis and associated ascites there was no biliary dilation or choledocholithiasis GI noted clinical picture not completely c/w alcohol related pancreatitis.  They question the etiology of pancreatitis specifically could this have been drug-induced?  Or possibly prior stone.  Also question the possibility of autoimmune pancreatitis as an additional contributing factor.  Started on bicarbonate infusion for worsening acidosis 12/24: Creatinine continuing to rise had been 3.3 on admission discontinued to increase.  From time of admission she had received 10 L of fluid, serum creatinine now up to 4.19. Lipase down to 622,  AST 185, ALT 90, Tbili 0.9 Nephrology was consulted and due to concern that she may ultimately  require dialysis critical care was asked to evaluate  Consults:  GI consulted 12/23 Surgical services consulted 12/24 Nephrology  consulted 12/24 Pulmonary/critical care consulted 12/24  Procedures:    Significant Diagnostic Tests:   12/22 ultrasound of the abdomen: Mild pericholecystic fluid.  No gallbladder wall thickening or gallstones. 12/23 MR abdomen with MRCP without contrast.  Small trace left and right pleural effusions.  Mild hepatic steatosis but no morphological findings consistent with cirrhosis.  Mild gallbladder sludge, no cholelithiasis, gallbladder wall thickening or inflammatory changes.  Mild pericholecystic fluid/edema moderate peripancreatic fluid/ascites , No walled off necrosis or pseudocyst. 12/23 MR 3D recon scanner acute pancreatitis with moderate abdominal ascites no necrosis or pseudocyst mild gallbladder sludge without associated inflammatory changes 12/23: Renal ultrasound echogenic kidneys bilaterally no obstruction Micro Data:  COVID-19 12/22: Negative MRSA screen: Negative  Antimicrobials:    Interim history/subjective:  Pain is better.  Not currently having nausea or vomiting.  Does have discomfort with swallowing primarily at the epigastric the region Objective   Blood pressure 131/82, pulse (Abnormal) 106, temperature 100.1 F (37.8 C), temperature source Oral, resp. rate (Abnormal) 26, height 5\' 7"  (1.702 m), weight 76.3 kg, last menstrual period 12/14/2018, SpO2 98 %.        Intake/Output Summary (Last 24 hours) at 01/06/2019 1308 Last data filed at 01/06/2019 [id number] Gross per 24 hour  Intake 2585.33 ml  Output 215 ml  Net 2370.33 ml   Filed Weights   01/04/19 0939 01/05/19 1800  Weight: 65.8 kg 76.3 kg    Examination: General: 33 year old female she is resting comfortably in bed she is in no acute distress HENT: Normocephalic atraumatic sclera are nonicteric there is no JVD mucous membranes are moist Lungs: Clear to auscultation without accessory use currently on room air Cardiovascular: Mildly tachycardic no murmur rub or gallop sinus tachycardia on  telemetry Abdomen: Soft, reports she feels slightly bloated, hypoactive bowel sounds no organomegaly Extremities: No significant edema, capillary refill is brisk pulses are strong warm to palpation Neuro: Awake oriented no focal deficits GU: Minimal urine output  Resolved Hospital Problem list   Elevated total bilirubin  Assessment & Plan:  Acute pancreatitis systemic inflammatory response syndrome/SIRS Felt likely multifactorial by GI service.  Alcohol felt to be contributing factor however abnormal elevated LFTs were not consistent with alcohol-related pancreatitis and more consistent with possible common bile duct stone versus drugs versus medication versus autoimmune process.  Radiographic imaging is negative for common bile duct obstruction.  Fortunately pain is improving and lipase trending down Plan Continuing supportive care Continue IV fluids Continue telemetry monitoring Advancing diet to clears As needed analgesia Repeat lipase in a.m.  Abnormal LFTs.  Etiology unclear, all improving.  Suspect multifactorial, alcohol, possibility of medication  Plan Continue to monitor Additional recommendations per GI  AKI, now ATN with ongoing anuria suspect hemodynamically mediated, further complicated by concomitant ACE inhibitor.   -Seen by nephrology Plan Continuing LR at 100 cc an hour Pulse oximetry to monitor for evidence of volume overload Serial chemistries Strict intake output No current role for urgent dialysis but should acid-base, or electrolyte imbalance, or breathing become an issue we may need to consider CRRT  Fluid and electrolyte imbalance: Hyponatremia, hypokalemia, hypocalcemia, now with nonanion gap metabolic acidosis, which is resolved Plan Continuing LR at 75 cc an hour Replace calcium and potassium Serial electrolytes Strict intake output  Painful swallowing, suspect mild esophagitis or gastritis with history of EtOH abuse Plan Continue Carafate, and  add PPI  At  risk for DTs Plan CIWA protocol   Best practice:  Diet: Clears Pain/Anxiety/Delirium protocol (if indicated): Not applicable VAP protocol (if indicated): Not applicable DVT prophylaxis: Heparin subcutaneously every 8 hours GI prophylaxis: PPI and Carafate Glucose control: Pending Mobility: Bedrest Code Status: Full code Family Communication: Per primary Disposition: Continue current supportive care.  We will continue Carafate for GI symptoms, gentle hydration with LR at 100 cc an hour discontinuing bicarbonate is at its base has normalized.  Get chest x-ray rule out edema/pleural effusion.  No urgent need for dialysis but we will be on board to assist with line placement.  Labs   CBC: Recent Labs  Lab 01/04/19 0943 01/04/19 2208 01/05/19 0414 01/06/19 0854  WBC 16.4* 8.3 7.4 9.2  NEUTROABS 15.6*  --  6.5 8.0*  HGB 14.6 12.3 12.4 10.1*  HCT 42.0 35.1* 37.6 28.9*  MCV 95.2 96.2 99.2 95.4  PLT 333 180 176 146*    Basic Metabolic Panel: Recent Labs  Lab 01/04/19 1041 01/05/19 0414 01/06/19 0854  NA 131* 133* 134*  K 4.4 3.9 3.2*  CL 92* 105 97*  CO2 19* 13* 22  GLUCOSE 139* 106* 165*  BUN 36* 43* 58*  CREATININE 3.30* 4.19* 6.75*  CALCIUM 7.1* 5.9* 5.4*  MG  --  1.3*  --   PHOS  --  1.0*  --    GFR: Estimated Creatinine Clearance: 12.9 mL/min (A) (by C-G formula based on SCr of 6.75 mg/dL (H)). Recent Labs  Lab 01/04/19 0943 01/04/19 2208 01/05/19 0414 01/06/19 0854  WBC 16.4* 8.3 7.4 9.2  LATICACIDVEN 2.4*  --   --   --     Liver Function Tests: Recent Labs  Lab 01/04/19 1041 01/05/19 0414 01/06/19 0854  AST 1,669* 538* 185*  ALT 285* 160* 90*  ALKPHOS 74 85 96  BILITOT 2.6* 1.5* 0.9  PROT 7.1 6.1* 5.6*  ALBUMIN 3.8 3.0* 2.6*   Recent Labs  Lab 01/04/19 1041 01/05/19 0414 01/06/19 0854  LIPASE 4,101* 1,642* 622*   No results for input(s): AMMONIA in the last 168 hours.  ABG    Component Value Date/Time   TCO2 27  11/25/2007 0635     Coagulation Profile: No results for input(s): INR, PROTIME in the last 168 hours.  Cardiac Enzymes: No results for input(s): CKTOTAL, CKMB, CKMBINDEX, TROPONINI in the last 168 hours.  HbA1C: Hgb A1c MFr Bld  Date/Time Value Ref Range Status  01/05/2019 04:14 AM 4.9 4.8 - 5.6 % Final    Comment:    (NOTE) Pre diabetes:          5.7%-6.4% Diabetes:              >6.4% Glycemic control for   <7.0% adults with diabetes   05/25/2018 04:03 AM 5.0 4.8 - 5.6 % Final    Comment:    (NOTE) Pre diabetes:          5.7%-6.4% Diabetes:              >6.4% Glycemic control for   <7.0% adults with diabetes     CBG: Recent Labs  Lab 01/05/19 1209  GLUCAP 107*    Review of Systems:   Review of Systems  Constitutional: Positive for malaise/fatigue and weight loss. Negative for fever.  HENT: Positive for congestion and sinus pain.   Eyes: Negative.   Respiratory: Positive for shortness of breath.   Cardiovascular: Positive for chest pain.  Gastrointestinal: Positive for heartburn and nausea.  Genitourinary: Negative.  Musculoskeletal: Negative.   Skin: Negative.   Neurological: Negative.   Endo/Heme/Allergies: Negative.   Psychiatric/Behavioral: Negative.      Past Medical History  She,  has a past medical history of Hypertension.   Surgical History    Past Surgical History:  Procedure Laterality Date  . FOOT SURGERY       Social History   reports that she quit smoking about 7 months ago. Her smoking use included cigarettes. She smoked 4.00 packs per day. She has never used smokeless tobacco. She reports current alcohol use. She reports that she does not use drugs.   Family History   Her family history includes Hypertension in her brother, father, and mother.   Allergies No Known Allergies   Home Medications  Prior to Admission medications   Medication Sig Start Date End Date Taking? Authorizing Provider  lisinopril (ZESTRIL) 20 MG tablet  Take 1 tablet (20 mg total) by mouth daily. Patient not taking: Reported on 01/04/2019 07/14/18   Argentina Donovan, PA-C  metoprolol tartrate (LOPRESSOR) 25 MG tablet Take 1 tablet (25 mg total) by mouth 2 (two) times daily. Patient not taking: Reported on 01/04/2019 07/14/18   Argentina Donovan, PA-C  pantoprazole (PROTONIX) 40 MG tablet Take 1 tablet (40 mg total) by mouth daily at 12 noon. Patient not taking: Reported on 01/04/2019 07/14/18   Argentina Donovan, PA-C  sucralfate (CARAFATE) 1 GM/10ML suspension Take 10 mLs (1 g total) by mouth 4 (four) times daily -  with meals and at bedtime. Patient not taking: Reported on 01/04/2019 07/14/18   Argentina Donovan, PA-C  thiamine 100 MG tablet Take 1 tablet (100 mg total) by mouth daily. Patient not taking: Reported on 01/04/2019 05/28/18   Kayleen Memos, DO     Critical care time: NA    Erick Colace ACNP-BC [place] Pager # [phone] OR # [phone] if no answer

## 2019-01-06 NOTE — Consult Note (Signed)
[facility] Renal Consultation Note  Requesting MD:  Indication for Consultation: Acute kidney injury, maintenance of euvolemia, assessment and treatment of electrolyte and acid-base abnormalities  HPI:  Yolanda Flores is a 33 y.o. female.  With a history remarkable for hypertension alcohol abuse and tobacco abuse presented to the emergency room on 01/04/2019 with nausea vomiting abdominal pain.  MRCP 01/05/2019 showed gallbladder sludge associated inflammatory changes of pancreatitis associated with ascites.  No walled off necrosis or pseudocyst were seen HIDA scan ordered and gastroenterology following.  Creatinine appears to be elevated on admission at 3.3 and has continued to rise.  There has been hemodynamic instability with blood pressure of consistently less than 100 mmHg since admission.  Urine output appears poor with urine output on 01/05/2019 at 175 cc an hour.  Blood pressure 101/52 pulse 100 temperature 100.1 O2 sats 94% room air  No urine output.  Has been administration about 10 L of fluid.  Since admission  Sodium 134 potassium 3.2 chloride 97 CO2 22 BUN 58 creatinine 6.75 glucose 165 calcium 5.4 albumin 2.6 AST 185 ALT 90 WBC 9.2 hemoglobin 10.1 platelets 146  Metoprolol 25 mg twice daily multivitamins 1 daily, Protonix 40 mg daily thiamine 100 mg daily,  IV sodium chloride 100 cc an hour IV sodium bicarbonate 125 cc an hour  Outpatient medications included lisinopril.  Ultrasound showed abdominal ascites with echogenic kidneys bilaterally no obstruction   Creatinine, Ser  Date/Time Value Ref Range Status  01/06/2019 08:54 AM 6.75 (H) 0.44 - 1.00 mg/dL Final    Comment:    DELTA CHECK NOTED  01/05/2019 04:14 AM 4.19 (H) 0.44 - 1.00 mg/dL Final  01/04/2019 10:41 AM 3.30 (H) 0.44 - 1.00 mg/dL Final  05/27/2018 04:52 AM 0.69 0.44 - 1.00 mg/dL Final  05/26/2018 03:55 AM 0.68 0.44 - 1.00 mg/dL Final  05/24/2018 01:33 PM 0.83 0.44 - 1.00 mg/dL Final   11/25/2007 06:35 AM 1.0  Final     PMHx:   Past Medical History:  Diagnosis Date  . Hypertension     Past Surgical History:  Procedure Laterality Date  . FOOT SURGERY      Family Hx:  Family History  Problem Relation Age of Onset  . Hypertension Mother   . Hypertension Father   . Hypertension Brother     Social History:  reports that she quit smoking about 7 months ago. Her smoking use included cigarettes. She smoked 4.00 packs per day. She has never used smokeless tobacco. She reports current alcohol use. She reports that she does not use drugs.  Allergies: No Known Allergies  Medications: Prior to Admission medications   Medication Sig Start Date End Date Taking? Authorizing Provider  lisinopril (ZESTRIL) 20 MG tablet Take 1 tablet (20 mg total) by mouth daily. Patient not taking: Reported on 01/04/2019 07/14/18   Argentina Donovan, PA-C  metoprolol tartrate (LOPRESSOR) 25 MG tablet Take 1 tablet (25 mg total) by mouth 2 (two) times daily. Patient not taking: Reported on 01/04/2019 07/14/18   Argentina Donovan, PA-C  pantoprazole (PROTONIX) 40 MG tablet Take 1 tablet (40 mg total) by mouth daily at 12 noon. Patient not taking: Reported on 01/04/2019 07/14/18   Argentina Donovan, PA-C  sucralfate (CARAFATE) 1 GM/10ML suspension Take 10 mLs (1 g total) by mouth 4 (four) times daily -  with meals and at bedtime. Patient not taking: Reported on 01/04/2019 07/14/18   Argentina Donovan, PA-C  thiamine 100 MG tablet Take 1  tablet (100 mg total) by mouth daily. Patient not taking: Reported on 01/04/2019 05/28/18   Kayleen Memos, DO     Labs:  Results for orders placed or performed during the hospital encounter of 01/04/19 (from the past 48 hour(s))  Respiratory Panel by RT PCR (Flu A&B, Covid) - Nasopharyngeal Swab     Status: None   Collection Time: 01/04/19  1:08 PM   Specimen: Nasopharyngeal Swab  Result Value Ref Range   SARS Coronavirus 2 by RT PCR NEGATIVE NEGATIVE     Comment: (NOTE) SARS-CoV-2 target nucleic acids are NOT DETECTED. The SARS-CoV-2 RNA is generally detectable in upper respiratoy specimens during the acute phase of infection. The lowest concentration of SARS-CoV-2 viral copies this assay can detect is 131 copies/mL. A negative result does not preclude SARS-Cov-2 infection and should not be used as the sole basis for treatment or other patient management decisions. A negative result may occur with  improper specimen collection/handling, submission of specimen other than nasopharyngeal swab, presence of viral mutation(s) within the areas targeted by this assay, and inadequate number of viral copies (<131 copies/mL). A negative result must be combined with clinical observations, patient history, and epidemiological information. The expected result is Negative. Fact Sheet for Patients:  PinkCheek.be Fact Sheet for Healthcare Providers:  GravelBags.it This test is not yet ap proved or cleared by the [place] FDA and  has been authorized for detection and/or diagnosis of SARS-CoV-2 by FDA under an Emergency Use Authorization (EUA). This EUA will remain  in effect (meaning this test can be used) for the duration of the COVID-19 declaration under Section 564(b)(1) of the Act, 21 U.S.C. section 360bbb-3(b)(1), unless the authorization is terminated or revoked sooner.    Influenza A by PCR NEGATIVE NEGATIVE   Influenza B by PCR NEGATIVE NEGATIVE    Comment: (NOTE) The Xpert Xpress SARS-CoV-2/FLU/RSV assay is intended as an aid in  the diagnosis of influenza from Nasopharyngeal swab specimens and  should not be used as a sole basis for treatment. Nasal washings and  aspirates are unacceptable for Xpert Xpress SARS-CoV-2/FLU/RSV  testing. Fact Sheet for Patients: PinkCheek.be Fact Sheet for Healthcare  Providers: GravelBags.it This test is not yet approved or cleared by the [place] FDA and  has been authorized for detection and/or diagnosis of SARS-CoV-2 by  FDA under an Emergency Use Authorization (EUA). This EUA will remain  in effect (meaning this test can be used) for the duration of the  Covid-19 declaration under Section 564(b)(1) of the Act, 21  U.S.C. section 360bbb-3(b)(1), unless the authorization is  terminated or revoked. Performed at [facility], [place] [address] [place], [place] [address]   CBC     Status: Abnormal   Collection Time: 01/04/19 10:08 PM  Result Value Ref Range   WBC 8.3 4.0 - 10.5 K/uL   RBC 3.65 (L) 3.87 - 5.11 MIL/uL   Hemoglobin 12.3 12.0 - 15.0 g/dL   HCT 35.1 (L) 36.0 - 46.0 %   MCV 96.2 80.0 - 100.0 fL   MCH 33.7 26.0 - 34.0 pg   MCHC 35.0 30.0 - 36.0 g/dL   RDW 14.1 11.5 - 15.5 %   Platelets 180 150 - 400 K/uL   nRBC 0.0 0.0 - 0.2 %    Comment: Performed at [facility], [place] [address], [place], [place] [address]  Magnesium     Status: Abnormal   Collection Time: 01/05/19  4:14 AM  Result Value Ref  Range   Magnesium 1.3 (L) 1.7 - 2.4 mg/dL    Comment: Performed at [facility], [place] [address], [place], [place] [address]  Phosphorus     Status: Abnormal   Collection Time: 01/05/19  4:14 AM  Result Value Ref Range   Phosphorus 1.0 (LL) 2.5 - 4.6 mg/dL    Comment: CRITICAL RESULT CALLED TO, READ BACK BY AND VERIFIED WITH: PXENDINE,J @ 0644 ON [id number] BY POTEAT,S Performed at [place] [address], [place], [place] [address]   Comprehensive metabolic panel     Status: Abnormal   Collection Time: 01/05/19  4:14 AM  Result Value Ref Range   Sodium 133 (L) 135 - 145 mmol/L   Potassium 3.9 3.5 - 5.1 mmol/L   Chloride 105 98 - 111 mmol/L   CO2 13 (L) 22 - 32 mmol/L   Glucose, Bld 106 (H) 70 - 99 mg/dL   BUN 43 (H) 6 - 20 mg/dL    Creatinine, Ser 4.19 (H) 0.44 - 1.00 mg/dL   Calcium 5.9 (LL) 8.9 - 10.3 mg/dL    Comment: CRITICAL RESULT CALLED TO, READ BACK BY AND VERIFIED WITH: OXENDINE,J @ 0644 ON [id number] BY POTEAT,S    Total Protein 6.1 (L) 6.5 - 8.1 g/dL   Albumin 3.0 (L) 3.5 - 5.0 g/dL   AST 538 (H) 15 - 41 U/L   ALT 160 (H) 0 - 44 U/L   Alkaline Phosphatase 85 38 - 126 U/L   Total Bilirubin 1.5 (H) 0.3 - 1.2 mg/dL   GFR calc non Af Amer 13 (L) >60 mL/min   GFR calc Af Amer 15 (L) >60 mL/min   Anion gap 15 5 - 15    Comment: Performed at [facility], [place] [address] [place], [place] [address]  CBC with Differential/Platelet     Status: Abnormal   Collection Time: 01/05/19  4:14 AM  Result Value Ref Range   WBC 7.4 4.0 - 10.5 K/uL   RBC 3.79 (L) 3.87 - 5.11 MIL/uL   Hemoglobin 12.4 12.0 - 15.0 g/dL   HCT 37.6 36.0 - 46.0 %   MCV 99.2 80.0 - 100.0 fL   MCH 32.7 26.0 - 34.0 pg   MCHC 33.0 30.0 - 36.0 g/dL   RDW 14.5 11.5 - 15.5 %   Platelets 176 150 - 400 K/uL   nRBC 0.0 0.0 - 0.2 %   Neutrophils Relative % 87 %    Comment: VACUOLATED NEUTROPHILS   Neutro Abs 6.5 1.7 - 7.7 K/uL   Lymphocytes Relative 6 %   Lymphs Abs 0.4 (L) 0.7 - 4.0 K/uL   Monocytes Relative 5 %   Monocytes Absolute 0.3 0.1 - 1.0 K/uL   Eosinophils Relative 1 %   Eosinophils Absolute 0.1 0.0 - 0.5 K/uL   Basophils Relative 1 %   Basophils Absolute 0.1 0.0 - 0.1 K/uL   WBC Morphology MILD LEFT SHIFT (1-5% METAS, OCC MYELO, OCC BANDS)    Immature Granulocytes 0 %   Abs Immature Granulocytes 0.02 0.00 - 0.07 K/uL    Comment: Performed at [facility], [place] [address] [place], [place] [address]  TSH     Status: None   Collection Time: 01/05/19  4:14 AM  Result Value Ref Range   TSH 0.567 0.350 - 4.500 uIU/mL    Comment: Performed by a 3rd Generation assay with a functional sensitivity of <=0.01 uIU/mL. Performed at [facility], [place]  [address], [place], [place]  [address]   Hemoglobin A1c     Status: None   Collection Time: 01/05/19  4:14 AM  Result Value Ref Range   Hgb A1c MFr Bld 4.9 4.8 - 5.6 %    Comment: (NOTE) Pre diabetes:          5.7%-6.4% Diabetes:              >6.4% Glycemic control for   <7.0% adults with diabetes    Mean Plasma Glucose 93.93 mg/dL    Comment: Performed at [place] [address], [place], [place] [address]  Lipase, blood     Status: Abnormal   Collection Time: 01/05/19  4:14 AM  Result Value Ref Range   Lipase 1,642 (H) 11 - 51 U/L    Comment: RESULTS CONFIRMED BY MANUAL DILUTION Performed at [facility], [place] [address] [place], [place] [address]   Hepatitis panel, acute     Status: None   Collection Time: 01/05/19  4:14 AM  Result Value Ref Range   Hepatitis B Surface Ag NON REACTIVE NON REACTIVE   HCV Ab NON REACTIVE NON REACTIVE    Comment: (NOTE) Nonreactive HCV antibody screen is consistent with no HCV infections,  unless recent infection is suspected or other evidence exists to indicate HCV infection.    Hep A IgM NON REACTIVE NON REACTIVE   Hep B C IgM NON REACTIVE NON REACTIVE    Comment: Performed at [facility], [place] [address] [place], [place] [address]  CBG monitoring, ED     Status: Abnormal   Collection Time: 01/05/19 12:09 PM  Result Value Ref Range   Glucose-Capillary 107 (H) 70 - 99 mg/dL  Acetaminophen level     Status: Abnormal   Collection Time: 01/05/19  4:37 PM  Result Value Ref Range   Acetaminophen (Tylenol), Serum <10 (L) 10 - 30 ug/mL    Comment: (NOTE) Therapeutic concentrations vary significantly. A range of 10-30 ug/mL  may be an effective concentration for many patients. However, some  are best treated at concentrations outside of this range. Acetaminophen concentrations >150 ug/mL at 4 hours after ingestion  and >50 ug/mL at 12 hours after ingestion are often associated with  toxic reactions. Performed at [facility], [place] [address] [place], [place] [address]   MRSA PCR Screening     Status: None   Collection Time: 01/06/19  3:40 AM   Specimen: Nasal Mucosa; Nasopharyngeal  Result Value Ref Range   MRSA by PCR NEGATIVE NEGATIVE    Comment:        The GeneXpert MRSA Assay (FDA approved for NASAL specimens only), is one component of a comprehensive MRSA colonization surveillance program. It is not intended to diagnose MRSA infection nor to guide or monitor treatment for MRSA infections. Performed at [facility], [place] [address], [place], [place] [address]   CBC with Differential/Platelet     Status: Abnormal   Collection Time: 01/06/19  8:54 AM  Result Value Ref Range   WBC 9.2 4.0 - 10.5 K/uL   RBC 3.03 (L) 3.87 - 5.11 MIL/uL   Hemoglobin 10.1 (L) 12.0 - 15.0 g/dL   HCT 28.9 (L) 36.0 - 46.0 %   MCV 95.4 80.0 - 100.0 fL   MCH 33.3 26.0 - 34.0 pg   MCHC 34.9 30.0 - 36.0 g/dL   RDW 14.3 11.5 - 15.5 %   Platelets 146 (L) 150 -  400 K/uL   nRBC 0.0 0.0 - 0.2 %   Neutrophils Relative % 88 %   Neutro Abs 8.0 (H) 1.7 - 7.7 K/uL   Lymphocytes Relative 5 %   Lymphs Abs 0.5 (L) 0.7 - 4.0 K/uL   Monocytes Relative 5 %   Monocytes Absolute 0.4 0.1 - 1.0 K/uL   Eosinophils Relative 2 %   Eosinophils Absolute 0.2 0.0 - 0.5 K/uL   Basophils Relative 0 %   Basophils Absolute 0.0 0.0 - 0.1 K/uL   Immature Granulocytes 0 %   Abs Immature Granulocytes 0.04 0.00 - 0.07 K/uL    Comment: Performed at [facility], [place] [address], [place], [place] [address]  Comprehensive metabolic panel     Status: Abnormal   Collection Time: 01/06/19  8:54 AM  Result Value Ref Range   Sodium 134 (L) 135 - 145 mmol/L   Potassium 3.2 (L) 3.5 - 5.1 mmol/L    Comment: DELTA CHECK NOTED   Chloride 97 (L) 98 - 111 mmol/L   CO2 22 22 - 32 mmol/L   Glucose, Bld 165 (H) 70 - 99 mg/dL   BUN 58 (H) 6 - 20 mg/dL   Creatinine, Ser 6.75 (H) 0.44 - 1.00 mg/dL    Comment: DELTA  CHECK NOTED   Calcium 5.4 (LL) 8.9 - 10.3 mg/dL    Comment: CRITICAL RESULT CALLED TO, READ BACK BY AND VERIFIED WITH: GILLIAM,M. RN AT [id number] 01/06/19 MULLINS,T    Total Protein 5.6 (L) 6.5 - 8.1 g/dL   Albumin 2.6 (L) 3.5 - 5.0 g/dL   AST 185 (H) 15 - 41 U/L   ALT 90 (H) 0 - 44 U/L   Alkaline Phosphatase 96 38 - 126 U/L   Total Bilirubin 0.9 0.3 - 1.2 mg/dL   GFR calc non Af Amer 7 (L) >60 mL/min   GFR calc Af Amer 9 (L) >60 mL/min   Anion gap 15 5 - 15    Comment: Performed at [facility], [place] [address], [place], [place] [address]  Lipase, blood     Status: Abnormal   Collection Time: 01/06/19  8:54 AM  Result Value Ref Range   Lipase 622 (H) 11 - 51 U/L    Comment: RESULTS CONFIRMED BY MANUAL DILUTION Performed at [facility], [place] [address], [place],  [address]      ROS:  Negative except for HPI  Physical Exam: Vitals:   01/06/19 1007 01/06/19 1135  BP: 131/82   Pulse: (!) 106   Resp:    Temp:  100.1 F (37.8 C)  SpO2:       General: Ill-appearing nondistressed HEENT: No icterus no pallor because membranes moist Eyes: Pupils round equal reactive Neck: JVP not elevated neck supple no thyromegaly Heart: Regular rate and rhythm no murmurs rubs or gallops Lungs: Clear to auscultation without rales Abdomen: Mildly tender nondistended Extremities: No edema Skin: No rashes no lesions Neuro: Intact  Assessment/Plan: 1.Acute kidney injury in the setting of acute pancreatitis.  She has had massive volume resuscitation.  Urinalysis is bland with no white blood cells no red blood cells.  She has no evidence of obstruction on ultrasound.  I suggest this looks like acute tubular necrosis.  This probably is ischemic as there is been no administration of nephrotoxins.  I am concerned about the persistent hypotension and recommend consulting with critical care medicine.  She is anuric with no recorded urine output.  This is very  troublesome and she may need CRRT. 2. Hypertension/volume  -appears to be adequately volume resuscitated with 10 L IV fluids. 4.  Metabolic acidosis.  Appears to have resolved at this point would discontinue her IV sodium bicarbonate. 5.  Hypokalemia.  Replete per primary service 6.  Hypocalcemia corrected calcium less than 8 mg/dL with 1 g calcium gluconate will follow. 7.  Acute pancreatitis appreciate assistance from gastroenterology   Sherril Croon 01/06/2019, 12:40 PM

## 2019-01-06 NOTE — Progress Notes (Addendum)
33 y.o. female with medical history for hypertension, GERD, alcohol abuse who presents with chief complaint of nausea vomiting abdominal pain that started Sunday.  Patient states that she was drinking on Sunday had a couple shots and subsequently she developed insidious abdominal pain she states the pain has been constant since Sunday and has been laying in bed due to the pain and is not able to sleep.  She states it hurts in the left upper quadrant and mid abdomen but does not radiate to the back.  Never had this type of pain before and states that she has been drinking shots previously.  Has been taking Protonix for epigastric pain and she also endorses chest discomfort while swallowing.  Because of these issues she came to the emergency room for further evaluation and she was found to have acute pancreatitis.  [facility] was asked to admit this patient for acute pancreatitis and gastroenterology was consulted for further evaluation recommendations  Subjective: Still having some abdominal pain but less than before. Was not making much urine as per nurse, 40cc since last night. Denies any fever.   Objective: Vital signs in last 24 hours: Temp:  [98 F (36.7 C)-100.1 F (37.8 C)] 100.1 F (37.8 C) (12/24 1135) Pulse Rate:  [93-106] 106 (12/24 1007) Resp:  [20-28] 26 (12/24 0600) BP: (91-133)/(60-97) 131/82 (12/24 1007) SpO2:  [98 %-100 %] 98 % (12/24 0600) Weight:  [76.3 kg] 76.3 kg (12/23 1800)  Intake/Output from previous day: 12/23 0701 - 12/24 0700 In: 2585.3 [I.V.:2332.3] Out: 175 [Urine:175] Intake/Output this shift: Total I/O In: -  Out: 40 [Urine:40]  Constitutional:  distress and appears uncomfortable Eyes: Lids and conjunctivae normal, sclerae anicteric  ENMT: External Ears, Nose appear normal. Grossly normal hearing. Mucous membranes are dry Neck: Appears normal, supple, no cervical masses, normal ROM, no appreciable thyromegaly Respiratory:  Clear to auscultation bilaterally, no  wheezing, rales, rhonchi or crackles.   No tachypnea or accessory muscle use Cardiovascular: Tachycardic rate but regular rhythm, no murmurs / rubs / gallops. S1 and S2 auscultated.  Abdomen: Soft,Tender to palpate. Distended. Bowel sounds positive x4.  GU: Deferred. Musculoskeletal: No clubbing / cyanosis of digits/nails. No joint deformity upper and lower extremities.  Skin: No rashes, lesions, ulcers on a limited skin evaluation. No induration; Warm and dry.  Neurologic: CN 2-12 grossly intact with no focal deficits. Romberg sign cerebellar reflexes not assessed.  Psychiatric: Normal judgment and insight. Alert and oriented x 3. Normal mood and appropriate affect.   Results for orders placed or performed during the hospital encounter of 01/04/19 (from the past 24 hour(s))  Acetaminophen level     Status: Abnormal   Collection Time: 01/05/19  4:37 PM  Result Value Ref Range   Acetaminophen (Tylenol), Serum <10 (L) 10 - 30 ug/mL  MRSA PCR Screening     Status: None   Collection Time: 01/06/19  3:40 AM   Specimen: Nasal Mucosa; Nasopharyngeal  Result Value Ref Range   MRSA by PCR NEGATIVE NEGATIVE  CBC with Differential/Platelet     Status: Abnormal   Collection Time: 01/06/19  8:54 AM  Result Value Ref Range   WBC 9.2 4.0 - 10.5 K/uL   RBC 3.03 (L) 3.87 - 5.11 MIL/uL   Hemoglobin 10.1 (L) 12.0 - 15.0 g/dL   HCT 28.9 (L) 36.0 - 46.0 %   MCV 95.4 80.0 - 100.0 fL   MCH 33.3 26.0 - 34.0 pg   MCHC 34.9 30.0 - 36.0 g/dL   RDW  14.3 11.5 - 15.5 %   Platelets 146 (L) 150 - 400 K/uL   nRBC 0.0 0.0 - 0.2 %   Neutrophils Relative % 88 %   Neutro Abs 8.0 (H) 1.7 - 7.7 K/uL   Lymphocytes Relative 5 %   Lymphs Abs 0.5 (L) 0.7 - 4.0 K/uL   Monocytes Relative 5 %   Monocytes Absolute 0.4 0.1 - 1.0 K/uL   Eosinophils Relative 2 %   Eosinophils Absolute 0.2 0.0 - 0.5 K/uL   Basophils Relative 0 %   Basophils Absolute 0.0 0.0 - 0.1 K/uL   Immature Granulocytes 0 %   Abs Immature Granulocytes  0.04 0.00 - 0.07 K/uL  Comprehensive metabolic panel     Status: Abnormal   Collection Time: 01/06/19  8:54 AM  Result Value Ref Range   Sodium 134 (L) 135 - 145 mmol/L   Potassium 3.2 (L) 3.5 - 5.1 mmol/L   Chloride 97 (L) 98 - 111 mmol/L   CO2 22 22 - 32 mmol/L   Glucose, Bld 165 (H) 70 - 99 mg/dL   BUN 58 (H) 6 - 20 mg/dL   Creatinine, Ser 6.75 (H) 0.44 - 1.00 mg/dL   Calcium 5.4 (LL) 8.9 - 10.3 mg/dL   Total Protein 5.6 (L) 6.5 - 8.1 g/dL   Albumin 2.6 (L) 3.5 - 5.0 g/dL   AST 185 (H) 15 - 41 U/L   ALT 90 (H) 0 - 44 U/L   Alkaline Phosphatase 96 38 - 126 U/L   Total Bilirubin 0.9 0.3 - 1.2 mg/dL   GFR calc non Af Amer 7 (L) >60 mL/min   GFR calc Af Amer 9 (L) >60 mL/min   Anion gap 15 5 - 15  Lipase, blood     Status: Abnormal   Collection Time: 01/06/19  8:54 AM  Result Value Ref Range   Lipase 622 (H) 11 - 51 U/L    Studies/Results: US RENAL  Result Date: 01/05/2019 CLINICAL DATA:  Acute kidney insufficiency. Nausea and vomiting. Dehydration. EXAM: RENAL / URINARY TRACT ULTRASOUND COMPLETE COMPARISON:  None. FINDINGS: Right Kidney: Renal measurements: 12.5 x 6.3 x 5.5 cm = volume: 230 mL. The parenchyma is diffusely echogenic. There is no stone or mass lesion. No hydronephrosis is present. Left Kidney: Renal measurements: 12.5 x 5.7 x 5.1 cm = volume: 188 mL. The parenchyma is diffusely echogenic. There is no stone or mass lesion. No hydronephrosis is present. Bladder: Appears normal for degree of bladder distention. Other: Small amount of ascites is present. Perinephric fluid is evident bilaterally. IMPRESSION: 1. Echogenic kidneys bilaterally. This is nonspecific, but can be seen in the setting of medical renal disease. No obstructive disease is present. 2. Abdominal ascites. Electronically Signed   By: San Morelle M.D.   On: 01/05/2019 19:03   MR ABDOMEN MRCP WO CONTRAST  Result Date: 01/05/2019 CLINICAL DATA:  Acute pancreatitis, nausea/vomiting/abdominal pain,  ETOH EXAM: MRI ABDOMEN WITHOUT CONTRAST  (INCLUDING MRCP) TECHNIQUE: Multiplanar multisequence MR imaging of the abdomen was performed. Heavily T2-weighted images of the biliary and pancreatic ducts were obtained, and three-dimensional MRCP images were rendered by post processing. COMPARISON:  None. FINDINGS: Lower chest: Small left and trace right pleural effusions. Hepatobiliary: Mild hepatic steatosis. No morphologic findings of cirrhosis. No focal hepatic lesion is seen. Mild gallbladder sludge (series 11/image 62). No cholelithiasis, gallbladder wall thickening, or inflammatory changes. Mild pericholecystic fluid/edema is likely related to the peripancreatic inflammatory process (described below). No intrahepatic or extrahepatic ductal dilatation. Common  duct measures 4 mm. No choledocholithiasis is seen. Pancreas: Moderate peripancreatic fluid/ascites, likely reflecting acute pancreatitis. No pancreatic hemorrhage. Pancreatic necrosis cannot be assessed in the absence of intravenous contrast administration. No walled-off necrosis/pseudocyst. Spleen:  Within normal limits. Adrenals/Urinary Tract:  Adrenal glands are within normal limits. Kidneys are within normal limits, noting mild right perinephric fluid. No hydronephrosis. Stomach/Bowel: Stomach is within normal limits, noting mild fluid along the gastric cardia. Visualized bowel is unremarkable. Vascular/Lymphatic:  No evidence of abdominal aortic aneurysm. No suspicious abdominal lymphadenopathy. Other: Moderate upper abdominal ascites centered along the pancreas and right upper quadrant. Musculoskeletal: No focal osseous lesions. IMPRESSION: Acute pancreatitis with moderate abdominal ascites. No walled-off necrosis/pseudocyst. Mild gallbladder sludge, without associated inflammatory changes to suggest acute cholecystitis. No intrahepatic or extrahepatic ductal dilatation. No choledocholithiasis is seen. Electronically Signed   By: Julian Hy M.D.    On: 01/05/2019 12:16   MR 3D Recon At Scanner  Result Date: 01/05/2019 CLINICAL DATA:  Acute pancreatitis, nausea/vomiting/abdominal pain, ETOH EXAM: MRI ABDOMEN WITHOUT CONTRAST  (INCLUDING MRCP) TECHNIQUE: Multiplanar multisequence MR imaging of the abdomen was performed. Heavily T2-weighted images of the biliary and pancreatic ducts were obtained, and three-dimensional MRCP images were rendered by post processing. COMPARISON:  None. FINDINGS: Lower chest: Small left and trace right pleural effusions. Hepatobiliary: Mild hepatic steatosis. No morphologic findings of cirrhosis. No focal hepatic lesion is seen. Mild gallbladder sludge (series 11/image 62). No cholelithiasis, gallbladder wall thickening, or inflammatory changes. Mild pericholecystic fluid/edema is likely related to the peripancreatic inflammatory process (described below). No intrahepatic or extrahepatic ductal dilatation. Common duct measures 4 mm. No choledocholithiasis is seen. Pancreas: Moderate peripancreatic fluid/ascites, likely reflecting acute pancreatitis. No pancreatic hemorrhage. Pancreatic necrosis cannot be assessed in the absence of intravenous contrast administration. No walled-off necrosis/pseudocyst. Spleen:  Within normal limits. Adrenals/Urinary Tract:  Adrenal glands are within normal limits. Kidneys are within normal limits, noting mild right perinephric fluid. No hydronephrosis. Stomach/Bowel: Stomach is within normal limits, noting mild fluid along the gastric cardia. Visualized bowel is unremarkable. Vascular/Lymphatic:  No evidence of abdominal aortic aneurysm. No suspicious abdominal lymphadenopathy. Other: Moderate upper abdominal ascites centered along the pancreas and right upper quadrant. Musculoskeletal: No focal osseous lesions. IMPRESSION: Acute pancreatitis with moderate abdominal ascites. No walled-off necrosis/pseudocyst. Mild gallbladder sludge, without associated inflammatory changes to suggest acute  cholecystitis. No intrahepatic or extrahepatic ductal dilatation. No choledocholithiasis is seen. Electronically Signed   By: Julian Hy M.D.   On: 01/05/2019 12:16   RUQ [place]  Result Date: 01/04/2019 CLINICAL DATA:  Upper abdominal pain EXAM: ULTRASOUND ABDOMEN LIMITED RIGHT UPPER QUADRANT COMPARISON:  None. FINDINGS: Gallbladder: No gallstones or wall thickening visualized. There is slight pericholecystic fluid. No sonographic Murphy sign noted by sonographer. Common bile duct: Diameter: 5 mm. No intrahepatic or extrahepatic biliary duct dilatation. Liver: No focal lesion identified. Liver echogenicity is increased. Portal vein is patent on color Doppler imaging with normal direction of blood flow towards the liver. Other: None. IMPRESSION: 1. There is mild pericholecystic fluid. No gallbladder wall thickening or gallstones evident. Etiology for this pericholecystic fluid is uncertain. This is a finding that may be associated with early acute cholecystitis. In this regard, it may be prudent to consider nuclear medicine hepatobiliary imaging study to assess for cystic duct patency. 2. Increase in liver echogenicity, a finding indicative of hepatic steatosis. No focal liver lesions are evident. Electronically Signed   By: Lowella Grip III M.D.   On: 01/04/2019 16:18    Scheduled Meds: .  Chlorhexidine Gluconate Cloth  6 each Topical Daily  . folic acid  1 mg Oral Daily  . heparin  5,000 Units Subcutaneous Q8H  . LORazepam  0-4 mg Intravenous Q6H   Followed by  . LORazepam  0-4 mg Intravenous Q12H  . multivitamin with minerals  1 tablet Oral Daily  . pantoprazole  40 mg Oral Q1200  . sucralfate  1 g Oral TID WC & HS  . thiamine  100 mg Oral Daily   Or  . thiamine  100 mg Intravenous Daily   Continuous Infusions: . calcium gluconate    . lactated ringers    . potassium chloride     PRN Meds:bisacodyl, LORazepam **OR** LORazepam, morphine injection, ondansetron **OR** ondansetron  (ZOFRAN) IV, oxyCODONE, phenol, polyethylene glycol  Assessment/Plan: Assessment/Plan Active Problems:   HTN (hypertension)   Lactic acidosis   Alcohol abuse   Abnormal LFTs   Acute gallstone pancreatitis   Pancreatitis  SIRS from Acute Pancreatitis: Improving -On admission Presented with tachypnea, tachycardia, leukocytosis and a pancreatitis - Still running sporadic episode of low BP  -Unclear Etiology of her pancreatitis but could be biliary versus alcohol induced versus drug intoxication versus shock liver versus CBD stone -Patient's lipase came down to ~ 600 from over 4000 ; while LFTs also shown improvement as AST came down to 185 from 4000 and ALT came down to 90 from 1600 -Started CLD per Pulm Critical Care; monitor closely  - Appreciate Pulm Critical Care recs  -Continue IV fluid hydration; S post multiple IV bolus in ER -Continue to monitor trend LFTs and lipase level -Continue with pain control -Gastroenterology Recs appreciated -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 Acute pancreatitis with moderate abdominal ascites. No walled-off necrosis/pseudocyst. There is no biliary dilatation.  No choledocholithiasis. -HIDA scan per GI  -Continue to monitor and follow lipase levels and LFTs - Gen surgery deosn't think any intervention indicated at this time; appreciate their recs  - Avoid hepatotoxic agents -We will continue to  monitor patient in the stepdown unit for now  Hyponatremia/Hypocholremia: Improving -Again likely from dehydration - slowly improving  -Continue IV fluid hydration as above  Lactic Acidosis: On admission -Patient's lactic acid level on admission was 2.4 -Could be from dehydration or SIRS -IV fluid hydration as above -Continue to monitor and trend and may monitor  AKI and Oliguria  Elevated anion gap metabolic  acidosis:  - AG resolved  - Pt Cr is worse and Pt is oliguric now; UOP only 40cc per nurse - Foley cath in  - S/P D5-HCO3 overnight. Stopped today  - Currently on LR  - Strict I/O - May check intra-abdominal pressure through foley that may compromise UOP; Pt also has some ascites per imaging  - renal ultrasound: No obstructive disease is present. Abdominal ascites. -Avoid nephrotoxic medications, contrast dyes, hypotension and renally adjust medications -UA WNL  -Consulted nephrology.  And appreciate the rec - May need CRRT  -Continue to monitor and trend renal function next-repeat CMP in a.m.  Hyperglycemia: Resolved  -Check hemoglobin A1c found to be 4.9 -Continue to monitor  Abnormal LFTs: Trending down significantly  -as above (#1) -MRCP as above -Avoid hepatotoxic agents - serum Tylenol level, UDS negative; hepatitis panel NR  -Continue to monitor and trend LFTs -Appreciate GI recs -Repeat CMP in a.m.  Essential Hypertension: Improving -Was elevated on admission at 163/126 -We will place on IV  hydralazine  -Continue metoprolol if allowed to have p.o. intake  Alcohol abuse -Patient drinks at least 3 times a week - CIWA protocol and continue with folic acid, multivitamin and thiamine -Counseling on cessation  Electrolyte imbalance: -Replenishment and recheck  GERD -PPI  DVT prophylaxis: Heparin  Code Status: FULL  Family Communication: No family present  Disposition Plan: Pending improvement and back to baseline  Consults called: Gastroenterology , neurology    LOS: 2 days   Thornell Mule

## 2019-01-06 NOTE — Progress Notes (Signed)
[facility]:  [phone] General Surgery Progress Note   LOS: 2 days  POD -     Chief Complaint: Abdominal pain  Assessment and Plan: 1.  Pancreatitis with moderate abdominal ascites - probably secondary to EtOH  Lipase - 1,642 - 01/05/2019  WBC - 9,200 - 01/06/2019  Okay to have ice chips/sips from floor  There is no acute surgical issues.  It looks like her more significant problem will be her acute kidney injury.  2.  AKI  Creatinine - 6.75 - 01/06/2019 3.  Gall bladder sludge on MRCP 4.  History of EtOH abuse 5.  DVT prophylaxis - SQ Heparin   Active Problems:   HTN (hypertension)   Lactic acidosis   Alcohol abuse   Abnormal LFTs   Acute gallstone pancreatitis   Pancreatitis  Subjective:  She feels better. She is thirsty.   Her abdominal pain is much better.  She understands the role of alcohol in her disease process.  She is not married, has no children.  She lives with her sister.  She works as a Technical brewer at [employer].  Objective:   Vitals:   01/06/19 0600 01/06/19 0745  BP: 91/60   Pulse: 96   Resp: (!) 26   Temp:  99.7 F (37.6 C)  SpO2: 98%      Intake/Output from previous day:  12/23 0701 - 12/24 0700 In: 2585.3 [I.V.:2332.3; IV Piggyback:253] Out: 175 [Urine:175]  Intake/Output this shift:  No intake/output data recorded.   Physical Exam:   General: AA F who is alert and oriented.    HEENT: Normal. Pupils equal. .   Lungs: Clear   Abdomen: Soft, but very rare BS.   Lab Results:    Recent Labs    01/05/19 0414 01/06/19 0854  WBC 7.4 9.2  HGB 12.4 10.1*  HCT 37.6 28.9*  PLT 176 146*    BMET   Recent Labs    01/04/19 1041 01/05/19 0414  NA 131* 133*  K 4.4 3.9  CL 92* 105  CO2 19* 13*  GLUCOSE 139* 106*  BUN 36* 43*  CREATININE 3.30* 4.19*  CALCIUM 7.1* 5.9*    PT/INR  No results for input(s): LABPROT, INR in the last 72 hours.  ABG  No results for input(s): PHART, HCO3 in the last 72 hours.  Invalid  input(s): PCO2, PO2   Studies/Results:  US RENAL  Result Date: 01/05/2019 CLINICAL DATA:  Acute kidney insufficiency. Nausea and vomiting. Dehydration. EXAM: RENAL / URINARY TRACT ULTRASOUND COMPLETE COMPARISON:  None. FINDINGS: Right Kidney: Renal measurements: 12.5 x 6.3 x 5.5 cm = volume: 230 mL. The parenchyma is diffusely echogenic. There is no stone or mass lesion. No hydronephrosis is present. Left Kidney: Renal measurements: 12.5 x 5.7 x 5.1 cm = volume: 188 mL. The parenchyma is diffusely echogenic. There is no stone or mass lesion. No hydronephrosis is present. Bladder: Appears normal for degree of bladder distention. Other: Small amount of ascites is present. Perinephric fluid is evident bilaterally. IMPRESSION: 1. Echogenic kidneys bilaterally. This is nonspecific, but can be seen in the setting of medical renal disease. No obstructive disease is present. 2. Abdominal ascites. Electronically Signed   By: San Morelle M.D.   On: 01/05/2019 19:03   MR ABDOMEN MRCP WO CONTRAST  Result Date: 01/05/2019 CLINICAL DATA:  Acute pancreatitis, nausea/vomiting/abdominal pain, ETOH EXAM: MRI ABDOMEN WITHOUT CONTRAST  (INCLUDING MRCP) TECHNIQUE: Multiplanar multisequence MR imaging of the abdomen was performed. Heavily T2-weighted images of the  biliary and pancreatic ducts were obtained, and three-dimensional MRCP images were rendered by post processing. COMPARISON:  None. FINDINGS: Lower chest: Small left and trace right pleural effusions. Hepatobiliary: Mild hepatic steatosis. No morphologic findings of cirrhosis. No focal hepatic lesion is seen. Mild gallbladder sludge (series 11/image 62). No cholelithiasis, gallbladder wall thickening, or inflammatory changes. Mild pericholecystic fluid/edema is likely related to the peripancreatic inflammatory process (described below). No intrahepatic or extrahepatic ductal dilatation. Common duct measures 4 mm. No choledocholithiasis is seen. Pancreas:  Moderate peripancreatic fluid/ascites, likely reflecting acute pancreatitis. No pancreatic hemorrhage. Pancreatic necrosis cannot be assessed in the absence of intravenous contrast administration. No walled-off necrosis/pseudocyst. Spleen:  Within normal limits. Adrenals/Urinary Tract:  Adrenal glands are within normal limits. Kidneys are within normal limits, noting mild right perinephric fluid. No hydronephrosis. Stomach/Bowel: Stomach is within normal limits, noting mild fluid along the gastric cardia. Visualized bowel is unremarkable. Vascular/Lymphatic:  No evidence of abdominal aortic aneurysm. No suspicious abdominal lymphadenopathy. Other: Moderate upper abdominal ascites centered along the pancreas and right upper quadrant. Musculoskeletal: No focal osseous lesions. IMPRESSION: Acute pancreatitis with moderate abdominal ascites. No walled-off necrosis/pseudocyst. Mild gallbladder sludge, without associated inflammatory changes to suggest acute cholecystitis. No intrahepatic or extrahepatic ductal dilatation. No choledocholithiasis is seen. Electronically Signed   By: Julian Hy M.D.   On: 01/05/2019 12:16   MR 3D Recon At Scanner  Result Date: 01/05/2019 CLINICAL DATA:  Acute pancreatitis, nausea/vomiting/abdominal pain, ETOH EXAM: MRI ABDOMEN WITHOUT CONTRAST  (INCLUDING MRCP) TECHNIQUE: Multiplanar multisequence MR imaging of the abdomen was performed. Heavily T2-weighted images of the biliary and pancreatic ducts were obtained, and three-dimensional MRCP images were rendered by post processing. COMPARISON:  None. FINDINGS: Lower chest: Small left and trace right pleural effusions. Hepatobiliary: Mild hepatic steatosis. No morphologic findings of cirrhosis. No focal hepatic lesion is seen. Mild gallbladder sludge (series 11/image 62). No cholelithiasis, gallbladder wall thickening, or inflammatory changes. Mild pericholecystic fluid/edema is likely related to the peripancreatic inflammatory  process (described below). No intrahepatic or extrahepatic ductal dilatation. Common duct measures 4 mm. No choledocholithiasis is seen. Pancreas: Moderate peripancreatic fluid/ascites, likely reflecting acute pancreatitis. No pancreatic hemorrhage. Pancreatic necrosis cannot be assessed in the absence of intravenous contrast administration. No walled-off necrosis/pseudocyst. Spleen:  Within normal limits. Adrenals/Urinary Tract:  Adrenal glands are within normal limits. Kidneys are within normal limits, noting mild right perinephric fluid. No hydronephrosis. Stomach/Bowel: Stomach is within normal limits, noting mild fluid along the gastric cardia. Visualized bowel is unremarkable. Vascular/Lymphatic:  No evidence of abdominal aortic aneurysm. No suspicious abdominal lymphadenopathy. Other: Moderate upper abdominal ascites centered along the pancreas and right upper quadrant. Musculoskeletal: No focal osseous lesions. IMPRESSION: Acute pancreatitis with moderate abdominal ascites. No walled-off necrosis/pseudocyst. Mild gallbladder sludge, without associated inflammatory changes to suggest acute cholecystitis. No intrahepatic or extrahepatic ductal dilatation. No choledocholithiasis is seen. Electronically Signed   By: Julian Hy M.D.   On: 01/05/2019 12:16   RUQ [place]  Result Date: 01/04/2019 CLINICAL DATA:  Upper abdominal pain EXAM: ULTRASOUND ABDOMEN LIMITED RIGHT UPPER QUADRANT COMPARISON:  None. FINDINGS: Gallbladder: No gallstones or wall thickening visualized. There is slight pericholecystic fluid. No sonographic Murphy sign noted by sonographer. Common bile duct: Diameter: 5 mm. No intrahepatic or extrahepatic biliary duct dilatation. Liver: No focal lesion identified. Liver echogenicity is increased. Portal vein is patent on color Doppler imaging with normal direction of blood flow towards the liver. Other: None. IMPRESSION: 1. There is mild pericholecystic fluid. No gallbladder wall thickening  or  gallstones evident. Etiology for this pericholecystic fluid is uncertain. This is a finding that may be associated with early acute cholecystitis. In this regard, it may be prudent to consider nuclear medicine hepatobiliary imaging study to assess for cystic duct patency. 2. Increase in liver echogenicity, a finding indicative of hepatic steatosis. No focal liver lesions are evident. Electronically Signed   By: Lowella Grip III M.D.   On: 01/04/2019 16:18     Anti-infectives:   Anti-infectives (From admission, onward)   None      Alphonsa Overall, MD, [facility] Surgery Office: [phone] 01/06/2019

## 2019-01-07 DIAGNOSIS — F1010 Alcohol abuse, uncomplicated: Secondary | ICD-10-CM

## 2019-01-07 DIAGNOSIS — R7401 Elevation of levels of liver transaminase levels: Secondary | ICD-10-CM

## 2019-01-07 DIAGNOSIS — K8521 Alcohol induced acute pancreatitis with uninfected necrosis: Secondary | ICD-10-CM

## 2019-01-07 DIAGNOSIS — D649 Anemia, unspecified: Secondary | ICD-10-CM

## 2019-01-07 LAB — IRON AND TIBC
Iron: 27 ug/dL — ABNORMAL LOW (ref 28–170)
Saturation Ratios: 12 % (ref 10.4–31.8)
TIBC: 233 ug/dL — ABNORMAL LOW (ref 250–450)
UIBC: 206 ug/dL

## 2019-01-07 LAB — LIPASE, BLOOD: Lipase: 196 U/L — ABNORMAL HIGH (ref 11–51)

## 2019-01-07 LAB — COMPREHENSIVE METABOLIC PANEL
ALT: 71 U/L — ABNORMAL HIGH (ref 0–44)
AST: 115 U/L — ABNORMAL HIGH (ref 15–41)
Albumin: 2.6 g/dL — ABNORMAL LOW (ref 3.5–5.0)
Alkaline Phosphatase: 98 U/L (ref 38–126)
Anion gap: 16 — ABNORMAL HIGH (ref 5–15)
BUN: 65 mg/dL — ABNORMAL HIGH (ref 6–20)
CO2: 21 mmol/L — ABNORMAL LOW (ref 22–32)
Calcium: 6.1 mg/dL — CL (ref 8.9–10.3)
Chloride: 97 mmol/L — ABNORMAL LOW (ref 98–111)
Creatinine, Ser: 7.92 mg/dL — ABNORMAL HIGH (ref 0.44–1.00)
GFR calc Af Amer: 7 mL/min — ABNORMAL LOW (ref >60)
GFR calc non Af Amer: 6 mL/min — ABNORMAL LOW (ref >60)
Glucose, Bld: 108 mg/dL — ABNORMAL HIGH (ref 70–99)
Potassium: 3.3 mmol/L — ABNORMAL LOW (ref 3.5–5.1)
Sodium: 134 mmol/L — ABNORMAL LOW (ref 135–145)
Total Bilirubin: 0.9 mg/dL (ref 0.3–1.2)
Total Protein: 5.8 g/dL — ABNORMAL LOW (ref 6.5–8.1)

## 2019-01-07 LAB — BASIC METABOLIC PANEL
Anion gap: 18 — ABNORMAL HIGH (ref 5–15)
BUN: 70 mg/dL — ABNORMAL HIGH (ref 6–20)
CO2: 20 mmol/L — ABNORMAL LOW (ref 22–32)
Calcium: 6.3 mg/dL — CL (ref 8.9–10.3)
Chloride: 96 mmol/L — ABNORMAL LOW (ref 98–111)
Creatinine, Ser: 8.87 mg/dL — ABNORMAL HIGH (ref 0.44–1.00)
GFR calc Af Amer: 6 mL/min — ABNORMAL LOW (ref >60)
GFR calc non Af Amer: 5 mL/min — ABNORMAL LOW (ref >60)
Glucose, Bld: 112 mg/dL — ABNORMAL HIGH (ref 70–99)
Potassium: 3.4 mmol/L — ABNORMAL LOW (ref 3.5–5.1)
Sodium: 134 mmol/L — ABNORMAL LOW (ref 135–145)

## 2019-01-07 LAB — CBC WITH DIFFERENTIAL/PLATELET
Abs Immature Granulocytes: 0.07 10*3/uL (ref 0.00–0.07)
Basophils Absolute: 0.1 10*3/uL (ref 0.0–0.1)
Basophils Relative: 1 %
Eosinophils Absolute: 0.1 10*3/uL (ref 0.0–0.5)
Eosinophils Relative: 1 %
HCT: 28.6 % — ABNORMAL LOW (ref 36.0–46.0)
Hemoglobin: 9.8 g/dL — ABNORMAL LOW (ref 12.0–15.0)
Immature Granulocytes: 1 %
Lymphocytes Relative: 5 %
Lymphs Abs: 0.4 10*3/uL — ABNORMAL LOW (ref 0.7–4.0)
MCH: 32.8 pg (ref 26.0–34.0)
MCHC: 34.3 g/dL (ref 30.0–36.0)
MCV: 95.7 fL (ref 80.0–100.0)
Monocytes Absolute: 1.2 10*3/uL — ABNORMAL HIGH (ref 0.1–1.0)
Monocytes Relative: 13 %
Neutro Abs: 7.8 10*3/uL — ABNORMAL HIGH (ref 1.7–7.7)
Neutrophils Relative %: 79 %
Platelets: 199 10*3/uL (ref 150–400)
RBC: 2.99 MIL/uL — ABNORMAL LOW (ref 3.87–5.11)
RDW: 14.6 % (ref 11.5–15.5)
WBC: 9.7 10*3/uL (ref 4.0–10.5)
nRBC: 0 % (ref 0.0–0.2)

## 2019-01-07 LAB — PHOSPHORUS
Phosphorus: <1 mg/dL — CL (ref 2.5–4.6)
Phosphorus: 2.8 mg/dL (ref 2.5–4.6)

## 2019-01-07 LAB — VITAMIN B12: Vitamin B-12: 1148 pg/mL — ABNORMAL HIGH (ref 180–914)

## 2019-01-07 LAB — FERRITIN: Ferritin: 593 ng/mL — ABNORMAL HIGH (ref 11–307)

## 2019-01-07 LAB — MAGNESIUM
Magnesium: 1.8 mg/dL (ref 1.7–2.4)
Magnesium: 2.1 mg/dL (ref 1.7–2.4)

## 2019-01-07 LAB — RETICULOCYTES
Immature Retic Fract: 13.8 % (ref 2.3–15.9)
RBC.: 2.99 MIL/uL — ABNORMAL LOW (ref 3.87–5.11)
Retic Count, Absolute: 19.4 10*3/uL (ref 19.0–186.0)
Retic Ct Pct: 0.7 % (ref 0.4–3.1)

## 2019-01-07 LAB — GLUCOSE, CAPILLARY: Glucose-Capillary: 113 mg/dL — ABNORMAL HIGH (ref 70–99)

## 2019-01-07 LAB — FOLATE: Folate: 14.3 ng/mL (ref >5.9)

## 2019-01-07 MED ORDER — SODIUM PHOSPHATES 45 MMOLE/15ML IV SOLN
30.0000 mmol | Freq: Once | INTRAVENOUS | Status: AC
  Administered 2019-01-07: 12:00:00 30 mmol via INTRAVENOUS
  Filled 2019-01-07: qty 10

## 2019-01-07 MED ORDER — CALCIUM GLUCONATE-NACL 1-0.675 GM/50ML-% IV SOLN: 1.0000 g | Freq: Once | INTRAVENOUS | Status: DC

## 2019-01-07 MED ORDER — FUROSEMIDE 10 MG/ML IJ SOLN
80.0000 mg | Freq: Two times a day (BID) | INTRAMUSCULAR | Status: DC
  Administered 2019-01-07 (×2): 80 mg via INTRAVENOUS
  Filled 2019-01-07 (×2): qty 8

## 2019-01-07 MED ORDER — ACETAMINOPHEN 325 MG PO TABS
650.0000 mg | ORAL_TABLET | Freq: Four times a day (QID) | ORAL | Status: AC | PRN
  Administered 2019-01-12: 650 mg via ORAL
  Filled 2019-01-07: qty 2

## 2019-01-07 MED ORDER — MAGNESIUM SULFATE 2 GM/50ML IV SOLN
2.0000 g | Freq: Once | INTRAVENOUS | Status: AC
  Administered 2019-01-07: 11:00:00 2 g via INTRAVENOUS
  Filled 2019-01-07: qty 50

## 2019-01-07 MED ORDER — ALUM & MAG HYDROXIDE-SIMETH 200-200-20 MG/5ML PO SUSP
30.0000 mL | Freq: Once | ORAL | Status: AC
  Administered 2019-01-07: 11:00:00 30 mL via ORAL
  Filled 2019-01-07: qty 30

## 2019-01-07 MED ORDER — HYDRALAZINE HCL 20 MG/ML IJ SOLN
10.0000 mg | Freq: Four times a day (QID) | INTRAMUSCULAR | Status: DC | PRN
  Administered 2019-01-08 – 2019-01-11 (×9): 10 mg via INTRAVENOUS
  Filled 2019-01-07 (×9): qty 1

## 2019-01-07 MED ORDER — POTASSIUM CHLORIDE 10 MEQ/100ML IV SOLN
10.0000 meq | INTRAVENOUS | Status: AC
  Administered 2019-01-07 (×4): 10 meq via INTRAVENOUS
  Filled 2019-01-07 (×4): qty 100

## 2019-01-07 MED ORDER — LIDOCAINE VISCOUS HCL 2 % MT SOLN
15.0000 mL | Freq: Once | OROMUCOSAL | Status: AC
  Administered 2019-01-07: 12:00:00 15 mL via ORAL
  Filled 2019-01-07: qty 15

## 2019-01-07 MED ORDER — CALCIUM GLUCONATE-NACL 2-0.675 GM/100ML-% IV SOLN
2.0000 g | Freq: Once | INTRAVENOUS | Status: AC
  Administered 2019-01-07: 08:00:00 2000 mg via INTRAVENOUS
  Filled 2019-01-07: qty 100

## 2019-01-07 MED ORDER — LABETALOL HCL 5 MG/ML IV SOLN
10.0000 mg | Freq: Four times a day (QID) | INTRAVENOUS | Status: DC | PRN
  Administered 2019-01-07 – 2019-01-09 (×4): 10 mg via INTRAVENOUS
  Filled 2019-01-07 (×5): qty 4

## 2019-01-07 MED ORDER — LIDOCAINE VISCOUS HCL 2 % MT SOLN
15.0000 mL | Freq: Four times a day (QID) | OROMUCOSAL | Status: DC | PRN
  Administered 2019-01-08 (×3): 15 mL via ORAL
  Filled 2019-01-07 (×3): qty 15

## 2019-01-07 MED ORDER — CALCIUM GLUCONATE-NACL 1-0.675 GM/50ML-% IV SOLN: 1.0000 g | INTRAVENOUS | Status: DC

## 2019-01-07 MED ORDER — POTASSIUM & SODIUM PHOSPHATES 280-160-250 MG PO PACK
2.0000 | PACK | ORAL | Status: AC
  Administered 2019-01-07 (×2): 2 via ORAL
  Filled 2019-01-07 (×2): qty 2

## 2019-01-07 MED ORDER — ALUM & MAG HYDROXIDE-SIMETH 200-200-20 MG/5ML PO SUSP
30.0000 mL | Freq: Four times a day (QID) | ORAL | Status: DC | PRN
  Administered 2019-01-08 – 2019-01-09 (×2): 30 mL via ORAL
  Filled 2019-01-07 (×2): qty 30

## 2019-01-07 NOTE — Progress Notes (Signed)
[facility]:  [phone] General Surgery Progress Note   LOS: 3 days  POD -     Chief Complaint: Abdominal pain  Assessment and Plan: 1.  Pancreatitis with moderate abdominal ascites - probably secondary to EtOH  Lipase - 1,642 - 01/05/2019  WBC - 9,200 - 01/06/2019  Agree with liquid diet  There is no acute surgical issues.  Her main problem looks like it is going to be her kidneys.  Will sign off.  There is no reason for follow up with surgery.  2.  AKI  Creatinine - 7.92 - 01/07/2019  Seen by Dr. Justin Mend for renal 3.  Gall bladder sludge on MRCP 4.  History of EtOH abuse 5.  DVT prophylaxis - SQ Heparin   Active Problems:   HTN (hypertension)   Lactic acidosis   Alcohol abuse   Abnormal LFTs   Acute gallstone pancreatitis   Pancreatitis  Subjective:  She feels better. No abdominal pain.  Taking PO's.  She had a BM.  She is not married, has no children.  She lives with her sister.  She works as a Technical brewer at [employer].  Objective:   Vitals:   01/07/19 0600 01/07/19 0700  BP: (!) 160/109 (!) 150/99  Pulse: (!) 116 (!) 114  Resp:  (!) 27  Temp:    SpO2: 96% 96%     Intake/Output from previous day:  12/24 0701 - 12/25 0700 In: 1213.6 [I.V.:913.6; IV Piggyback:300] Out: 108 [Urine:108]  Intake/Output this shift:  No intake/output data recorded.   Physical Exam:   General: AA F who is alert and oriented.    HEENT: Normal. Pupils equal. .   Lungs: Clear   Abdomen: Soft.  Has BS.   Lab Results:    Recent Labs    01/05/19 0414 01/06/19 0854  WBC 7.4 9.2  HGB 12.4 10.1*  HCT 37.6 28.9*  PLT 176 146*    BMET   Recent Labs    01/06/19 0854 01/07/19 0209  NA 134* 134*  K 3.2* 3.3*  CL 97* 97*  CO2 22 21*  GLUCOSE 165* 108*  BUN 58* 65*  CREATININE 6.75* 7.92*  CALCIUM 5.4* 6.1*    PT/INR  No results for input(s): LABPROT, INR in the last 72 hours.  ABG  No results for input(s): PHART, HCO3 in the last 72 hours.  Invalid  input(s): PCO2, PO2   Studies/Results:  US RENAL  Result Date: 01/05/2019 CLINICAL DATA:  Acute kidney insufficiency. Nausea and vomiting. Dehydration. EXAM: RENAL / URINARY TRACT ULTRASOUND COMPLETE COMPARISON:  None. FINDINGS: Right Kidney: Renal measurements: 12.5 x 6.3 x 5.5 cm = volume: 230 mL. The parenchyma is diffusely echogenic. There is no stone or mass lesion. No hydronephrosis is present. Left Kidney: Renal measurements: 12.5 x 5.7 x 5.1 cm = volume: 188 mL. The parenchyma is diffusely echogenic. There is no stone or mass lesion. No hydronephrosis is present. Bladder: Appears normal for degree of bladder distention. Other: Small amount of ascites is present. Perinephric fluid is evident bilaterally. IMPRESSION: 1. Echogenic kidneys bilaterally. This is nonspecific, but can be seen in the setting of medical renal disease. No obstructive disease is present. 2. Abdominal ascites. Electronically Signed   By: San Morelle M.D.   On: 01/05/2019 19:03   MR ABDOMEN MRCP WO CONTRAST  Result Date: 01/05/2019 CLINICAL DATA:  Acute pancreatitis, nausea/vomiting/abdominal pain, ETOH EXAM: MRI ABDOMEN WITHOUT CONTRAST  (INCLUDING MRCP) TECHNIQUE: Multiplanar multisequence MR imaging of the abdomen was  performed. Heavily T2-weighted images of the biliary and pancreatic ducts were obtained, and three-dimensional MRCP images were rendered by post processing. COMPARISON:  None. FINDINGS: Lower chest: Small left and trace right pleural effusions. Hepatobiliary: Mild hepatic steatosis. No morphologic findings of cirrhosis. No focal hepatic lesion is seen. Mild gallbladder sludge (series 11/image 62). No cholelithiasis, gallbladder wall thickening, or inflammatory changes. Mild pericholecystic fluid/edema is likely related to the peripancreatic inflammatory process (described below). No intrahepatic or extrahepatic ductal dilatation. Common duct measures 4 mm. No choledocholithiasis is seen. Pancreas:  Moderate peripancreatic fluid/ascites, likely reflecting acute pancreatitis. No pancreatic hemorrhage. Pancreatic necrosis cannot be assessed in the absence of intravenous contrast administration. No walled-off necrosis/pseudocyst. Spleen:  Within normal limits. Adrenals/Urinary Tract:  Adrenal glands are within normal limits. Kidneys are within normal limits, noting mild right perinephric fluid. No hydronephrosis. Stomach/Bowel: Stomach is within normal limits, noting mild fluid along the gastric cardia. Visualized bowel is unremarkable. Vascular/Lymphatic:  No evidence of abdominal aortic aneurysm. No suspicious abdominal lymphadenopathy. Other: Moderate upper abdominal ascites centered along the pancreas and right upper quadrant. Musculoskeletal: No focal osseous lesions. IMPRESSION: Acute pancreatitis with moderate abdominal ascites. No walled-off necrosis/pseudocyst. Mild gallbladder sludge, without associated inflammatory changes to suggest acute cholecystitis. No intrahepatic or extrahepatic ductal dilatation. No choledocholithiasis is seen. Electronically Signed   By: Julian Hy M.D.   On: 01/05/2019 12:16   MR 3D Recon At Scanner  Result Date: 01/05/2019 CLINICAL DATA:  Acute pancreatitis, nausea/vomiting/abdominal pain, ETOH EXAM: MRI ABDOMEN WITHOUT CONTRAST  (INCLUDING MRCP) TECHNIQUE: Multiplanar multisequence MR imaging of the abdomen was performed. Heavily T2-weighted images of the biliary and pancreatic ducts were obtained, and three-dimensional MRCP images were rendered by post processing. COMPARISON:  None. FINDINGS: Lower chest: Small left and trace right pleural effusions. Hepatobiliary: Mild hepatic steatosis. No morphologic findings of cirrhosis. No focal hepatic lesion is seen. Mild gallbladder sludge (series 11/image 62). No cholelithiasis, gallbladder wall thickening, or inflammatory changes. Mild pericholecystic fluid/edema is likely related to the peripancreatic inflammatory  process (described below). No intrahepatic or extrahepatic ductal dilatation. Common duct measures 4 mm. No choledocholithiasis is seen. Pancreas: Moderate peripancreatic fluid/ascites, likely reflecting acute pancreatitis. No pancreatic hemorrhage. Pancreatic necrosis cannot be assessed in the absence of intravenous contrast administration. No walled-off necrosis/pseudocyst. Spleen:  Within normal limits. Adrenals/Urinary Tract:  Adrenal glands are within normal limits. Kidneys are within normal limits, noting mild right perinephric fluid. No hydronephrosis. Stomach/Bowel: Stomach is within normal limits, noting mild fluid along the gastric cardia. Visualized bowel is unremarkable. Vascular/Lymphatic:  No evidence of abdominal aortic aneurysm. No suspicious abdominal lymphadenopathy. Other: Moderate upper abdominal ascites centered along the pancreas and right upper quadrant. Musculoskeletal: No focal osseous lesions. IMPRESSION: Acute pancreatitis with moderate abdominal ascites. No walled-off necrosis/pseudocyst. Mild gallbladder sludge, without associated inflammatory changes to suggest acute cholecystitis. No intrahepatic or extrahepatic ductal dilatation. No choledocholithiasis is seen. Electronically Signed   By: Julian Hy M.D.   On: 01/05/2019 12:16   DG Chest Port 1 View  Result Date: 01/06/2019 CLINICAL DATA:  Shortness of breath today, dyspnea EXAM: PORTABLE CHEST 1 VIEW COMPARISON:  Portable exam 1408 hours compared to 05/24/2018 FINDINGS: Normal heart size and mediastinal contours. Dense consolidation of LEFT lower lobe question atelectasis versus infiltrate. Minimal atelectasis or infiltrate at medial RIGHT lung base. Decreased lung volumes. Cannot exclude small LEFT pleural effusion. No pneumothorax or acute osseous findings. IMPRESSION: Atelectasis versus consolidation LEFT lower lobe and minimally at medial RIGHT lung base. Question small LEFT pleural  effusion. Electronically Signed    By: Lavonia Dana M.D.   On: 01/06/2019 14:17     Anti-infectives:   Anti-infectives (From admission, onward)   None      Alphonsa Overall, MD, [facility] Office: [phone] 01/07/2019

## 2019-01-07 NOTE — Progress Notes (Addendum)
Progress Note   Subjective  Feels better from GI standpoint Tolerating p.o. well Had few bowel movements Complains of heartburn despite PPIs.  GI cocktail added.   Objective   Vital signs in last 24 hours: Temp:  [99.6 F (37.6 C)-100.8 F (38.2 C)] 100.2 F (37.9 C) (12/25 1200) Pulse Rate:  [94-120] 110 (12/25 1300) Resp:  [14-31] 24 (12/25 1300) BP: (95-173)/(52-123) 169/123 (12/25 1300) SpO2:  [92 %-98 %] 96 % (12/25 1300) Weight:  [80 kg] 80 kg (12/25 0500) Last BM Date: 01/07/19 General: female in NAD Heart:  Regular rate and rhythm; no murmurs Lungs: Respirations even and unlabored, lungs CTA bilaterally Abdomen:  Soft, mild abdominal tenderness and nondistended. Normal bowel sounds. Extremities:  Without edema. Neurologic:  Alert and oriented,  grossly normal neurologically. Psych:  Cooperative. Normal mood and affect.  Intake/Output from previous day: 12/24 0701 - 12/25 0700 In: 1213.6 [I.V.:913.6; IV Piggyback:300] Out: 108 [Urine:108] Intake/Output this shift: Total I/O In: 505.6 [IV Piggyback:505.6] Out: 31 [Urine:31]  Lab Results: Recent Labs    01/05/19 0414 01/06/19 0854 01/07/19 0943  WBC 7.4 9.2 9.7  HGB 12.4 10.1* 9.8*  HCT 37.6 28.9* 28.6*  PLT 176 146* 199   BMET Recent Labs    01/05/19 0414 01/06/19 0854 01/07/19 0209  NA 133* 134* 134*  K 3.9 3.2* 3.3*  CL 105 97* 97*  CO2 13* 22 21*  GLUCOSE 106* 165* 108*  BUN 43* 58* 65*  CREATININE 4.19* 6.75* 7.92*  CALCIUM 5.9* 5.4* 6.1*   LFT Recent Labs    01/07/19 0209  PROT 5.8*  ALBUMIN 2.6*  AST 115*  ALT 71*  ALKPHOS 98  BILITOT 0.9   PT/INR No results for input(s): LABPROT, INR in the last 72 hours.  Studies/Results: US RENAL  Result Date: 01/05/2019 CLINICAL DATA:  Acute kidney insufficiency. Nausea and vomiting. Dehydration. EXAM: RENAL / URINARY TRACT ULTRASOUND COMPLETE COMPARISON:  None. FINDINGS: Right Kidney: Renal measurements: 12.5 x 6.3 x 5.5 cm =  volume: 230 mL. The parenchyma is diffusely echogenic. There is no stone or mass lesion. No hydronephrosis is present. Left Kidney: Renal measurements: 12.5 x 5.7 x 5.1 cm = volume: 188 mL. The parenchyma is diffusely echogenic. There is no stone or mass lesion. No hydronephrosis is present. Bladder: Appears normal for degree of bladder distention. Other: Small amount of ascites is present. Perinephric fluid is evident bilaterally. IMPRESSION: 1. Echogenic kidneys bilaterally. This is nonspecific, but can be seen in the setting of medical renal disease. No obstructive disease is present. 2. Abdominal ascites. Electronically Signed   By: San Morelle M.D.   On: 01/05/2019 19:03   DG Chest Port 1 View  Result Date: 01/06/2019 CLINICAL DATA:  Shortness of breath today, dyspnea EXAM: PORTABLE CHEST 1 VIEW COMPARISON:  Portable exam 1408 hours compared to 05/24/2018 FINDINGS: Normal heart size and mediastinal contours. Dense consolidation of LEFT lower lobe question atelectasis versus infiltrate. Minimal atelectasis or infiltrate at medial RIGHT lung base. Decreased lung volumes. Cannot exclude small LEFT pleural effusion. No pneumothorax or acute osseous findings. IMPRESSION: Atelectasis versus consolidation LEFT lower lobe and minimally at medial RIGHT lung base. Question small LEFT pleural effusion. Electronically Signed   By: Lavonia Dana M.D.   On: 01/06/2019 14:17      Assessment / Plan:     Acute pancreatitis d/t ETOH with SIRS and acute peripancreatic fluid collection. Necrosis could not be assessed d/t lack of IV contrast on MRCP. No cholelithiasis/CBD  dilatation. LFTs trending down. Worsening acute renal failure.   ETOH abuse.  Covid 19 - negative  Plan: -Full liquid diet.  Advance gradually to full liquid diet if tolerated. -Ambulate -Appreciate renal consultation by Dr Justin Mend.  -Monitor and correct electrolytes. -No GI intervention planned. -Stop all alcohol. -We will follow  along in the next 1 to 2 days.  Please call my cell phone listed below for any questions or concerns.  Carmell Austria, MD [facility] Heckler Fabienne Bruns [phone].     LOS: 3 days

## 2019-01-07 NOTE — Progress Notes (Signed)
[facility] ROUNDING NOTE   Subjective:   This is a 33 year old lady with a history remarkable for hypertension alcohol abuse tobacco abuse presented to emergency room 01/04/2019 with nausea vomiting and abdominal pain.  She had an MRCP 01/05/2019 with gallbladder sludge associated inflammatory changes of the pancreas and associated ascites with no walled off necrosis or pseudocyst.  Creatinine was elevated on admission at 3.3 mg/dL has continued to rise she has been hemodynamically unstable with blood pressures of less than 100 mmHg since admission and urine output on 01/05/2019 of 175 cc  Blood pressure 170/90 pulse 118 temperature 100.8 O2 sats 98%.  Oliguria noted 01/07/2019 with 46 cc of urine  Sodium 134 potassium 3.3 chloride 97 CO2 21 BUN 65 creatinine 7.92 calcium 6.1 albumin 62.6 phosphorus 1.0 magnesium 1.3 WBC 9.7 hemoglobin 9.8 platelets 199  Multivitamins 1 daily Carafate 1 g 3 times daily, Protonix 40 mg daily  IV potassium chloride administered 01/07/2019 x4 runs IV calcium gluconate administered 2 g 01/07/2019 IV potassium phosphate administered 10 mmol 11/06/2018  Objective:  Vital signs in last 24 hours:  Temp:  [99.6 F (37.6 C)-100.8 F (38.2 C)] 100.8 F (38.2 C) (12/25 0901) Pulse Rate:  [94-120] 114 (12/25 0700) Resp:  [14-31] 14 (12/25 0800) BP: (95-169)/(52-122) 150/99 (12/25 0700) SpO2:  [92 %-98 %] 96 % (12/25 0700) Weight:  [80 kg] 80 kg (12/25 0500)  Weight change: 3.7 kg Filed Weights   01/04/19 0939 01/05/19 1800 01/07/19 0500  Weight: 65.8 kg 76.3 kg 80 kg    Intake/Output: I/O last 3 completed shifts: In: 2798.9 [I.V.:2245.9; IV [id number] Out: 108 [id number]   Intake/Output this shift:  Total I/O In: 113 [IV Piggyback:113] Out: 20 [Urine:20]    Alert nondistressed CVS- RRR tachycardic RS- CTA no wheezes or rales ABD-soft bowel sounds hypoactive EXT- no edema   Basic Metabolic Panel: Recent Labs  Lab  01/04/19 1041 01/05/19 0414 01/06/19 0854 01/07/19 0209  NA 131* 133* 134* 134*  K 4.4 3.9 3.2* 3.3*  CL 92* 105 97* 97*  CO2 19* 13* 22 21*  GLUCOSE 139* 106* 165* 108*  BUN 36* 43* 58* 65*  CREATININE 3.30* 4.19* 6.75* 7.92*  CALCIUM 7.1* 5.9* 5.4* 6.1*  MG  --  1.3*  --   --   PHOS  --  1.0*  --   --     Liver Function Tests: Recent Labs  Lab 01/04/19 1041 01/05/19 0414 01/06/19 0854 01/07/19 0209  AST 1,669* 538* 185* 115*  ALT 285* 160* 90* 71*  ALKPHOS 74 85 96 98  BILITOT 2.6* 1.5* 0.9 0.9  PROT 7.1 6.1* 5.6* 5.8*  ALBUMIN 3.8 3.0* 2.6* 2.6*   Recent Labs  Lab 01/04/19 1041 01/05/19 0414 01/06/19 0854  LIPASE 4,101* 1,642* 622*   No results for input(s): AMMONIA in the last 168 hours.  CBC: Recent Labs  Lab 01/04/19 0943 01/04/19 2208 01/05/19 0414 01/06/19 0854 01/07/19 0943  WBC 16.4* 8.3 7.4 9.2 9.7  NEUTROABS 15.6*  --  6.5 8.0* PENDING  HGB 14.6 12.3 12.4 10.1* 9.8*  HCT 42.0 35.1* 37.6 28.9* 28.6*  MCV 95.2 96.2 99.2 95.4 95.7  PLT 333 180 176 146* 199    Cardiac Enzymes: No results for input(s): CKTOTAL, CKMB, CKMBINDEX, TROPONINI in the last 168 hours.  BNP: Invalid input(s): POCBNP  CBG: Recent Labs  Lab 01/05/19 1209 01/06/19 0745 01/07/19 0727  GLUCAP 107* 155* 113*    Microbiology: Results for orders placed or performed during  the hospital encounter of 01/04/19  Respiratory Panel by RT PCR (Flu A&B, Covid) - Nasopharyngeal Swab     Status: None   Collection Time: 01/04/19  1:08 PM   Specimen: Nasopharyngeal Swab  Result Value Ref Range Status   SARS Coronavirus 2 by RT PCR NEGATIVE NEGATIVE Final    Comment: (NOTE) SARS-CoV-2 target nucleic acids are NOT DETECTED. The SARS-CoV-2 RNA is generally detectable in upper respiratoy specimens during the acute phase of infection. The lowest concentration of SARS-CoV-2 viral copies this assay can detect is 131 copies/mL. A negative result does not preclude  SARS-Cov-2 infection and should not be used as the sole basis for treatment or other patient management decisions. A negative result may occur with  improper specimen collection/handling, submission of specimen other than nasopharyngeal swab, presence of viral mutation(s) within the areas targeted by this assay, and inadequate number of viral copies (<131 copies/mL). A negative result must be combined with clinical observations, patient history, and epidemiological information. The expected result is Negative. Fact Sheet for Patients:  PinkCheek.be Fact Sheet for Healthcare Providers:  GravelBags.it This test is not yet ap proved or cleared by the [place] FDA and  has been authorized for detection and/or diagnosis of SARS-CoV-2 by FDA under an Emergency Use Authorization (EUA). This EUA will remain  in effect (meaning this test can be used) for the duration of the COVID-19 declaration under Section 564(b)(1) of the Act, 21 U.S.C. section 360bbb-3(b)(1), unless the authorization is terminated or revoked sooner.    Influenza A by PCR NEGATIVE NEGATIVE Final   Influenza B by PCR NEGATIVE NEGATIVE Final    Comment: (NOTE) The Xpert Xpress SARS-CoV-2/FLU/RSV assay is intended as an aid in  the diagnosis of influenza from Nasopharyngeal swab specimens and  should not be used as a sole basis for treatment. Nasal washings and  aspirates are unacceptable for Xpert Xpress SARS-CoV-2/FLU/RSV  testing. Fact Sheet for Patients: PinkCheek.be Fact Sheet for Healthcare Providers: GravelBags.it This test is not yet approved or cleared by the [place] FDA and  has been authorized for detection and/or diagnosis of SARS-CoV-2 by  FDA under an Emergency Use Authorization (EUA). This EUA will remain  in effect (meaning this test can be used) for the duration of the  Covid-19  declaration under Section 564(b)(1) of the Act, 21  U.S.C. section 360bbb-3(b)(1), unless the authorization is  terminated or revoked. Performed at [facility], [place] [address], [place], [place] [address]   MRSA PCR Screening     Status: None   Collection Time: 01/06/19  3:40 AM   Specimen: Nasal Mucosa; Nasopharyngeal  Result Value Ref Range Status   MRSA by PCR NEGATIVE NEGATIVE Final    Comment:        The GeneXpert MRSA Assay (FDA approved for NASAL specimens only), is one component of a comprehensive MRSA colonization surveillance program. It is not intended to diagnose MRSA infection nor to guide or monitor treatment for MRSA infections. Performed at [facility], [place] [address] [place], [place] [address]     Coagulation Studies: No results for input(s): LABPROT, INR in the last 72 hours.  Urinalysis: No results for input(s): COLORURINE, LABSPEC, PHURINE, GLUCOSEU, HGBUR, BILIRUBINUR, KETONESUR, PROTEINUR, UROBILINOGEN, NITRITE, LEUKOCYTESUR in the last 72 hours.  Invalid input(s): APPERANCEUR    Imaging: US RENAL  Result Date: 01/05/2019 CLINICAL DATA:  Acute kidney insufficiency. Nausea and vomiting. Dehydration. EXAM: RENAL / URINARY TRACT ULTRASOUND COMPLETE COMPARISON:  None. FINDINGS:  Right Kidney: Renal measurements: 12.5 x 6.3 x 5.5 cm = volume: 230 mL. The parenchyma is diffusely echogenic. There is no stone or mass lesion. No hydronephrosis is present. Left Kidney: Renal measurements: 12.5 x 5.7 x 5.1 cm = volume: 188 mL. The parenchyma is diffusely echogenic. There is no stone or mass lesion. No hydronephrosis is present. Bladder: Appears normal for degree of bladder distention. Other: Small amount of ascites is present. Perinephric fluid is evident bilaterally. IMPRESSION: 1. Echogenic kidneys bilaterally. This is nonspecific, but can be seen in the setting of medical renal disease. No obstructive disease is present. 2.  Abdominal ascites. Electronically Signed   By: San Morelle M.D.   On: 01/05/2019 19:03   MR ABDOMEN MRCP WO CONTRAST  Result Date: 01/05/2019 CLINICAL DATA:  Acute pancreatitis, nausea/vomiting/abdominal pain, ETOH EXAM: MRI ABDOMEN WITHOUT CONTRAST  (INCLUDING MRCP) TECHNIQUE: Multiplanar multisequence MR imaging of the abdomen was performed. Heavily T2-weighted images of the biliary and pancreatic ducts were obtained, and three-dimensional MRCP images were rendered by post processing. COMPARISON:  None. FINDINGS: Lower chest: Small left and trace right pleural effusions. Hepatobiliary: Mild hepatic steatosis. No morphologic findings of cirrhosis. No focal hepatic lesion is seen. Mild gallbladder sludge (series 11/image 62). No cholelithiasis, gallbladder wall thickening, or inflammatory changes. Mild pericholecystic fluid/edema is likely related to the peripancreatic inflammatory process (described below). No intrahepatic or extrahepatic ductal dilatation. Common duct measures 4 mm. No choledocholithiasis is seen. Pancreas: Moderate peripancreatic fluid/ascites, likely reflecting acute pancreatitis. No pancreatic hemorrhage. Pancreatic necrosis cannot be assessed in the absence of intravenous contrast administration. No walled-off necrosis/pseudocyst. Spleen:  Within normal limits. Adrenals/Urinary Tract:  Adrenal glands are within normal limits. Kidneys are within normal limits, noting mild right perinephric fluid. No hydronephrosis. Stomach/Bowel: Stomach is within normal limits, noting mild fluid along the gastric cardia. Visualized bowel is unremarkable. Vascular/Lymphatic:  No evidence of abdominal aortic aneurysm. No suspicious abdominal lymphadenopathy. Other: Moderate upper abdominal ascites centered along the pancreas and right upper quadrant. Musculoskeletal: No focal osseous lesions. IMPRESSION: Acute pancreatitis with moderate abdominal ascites. No walled-off necrosis/pseudocyst. Mild  gallbladder sludge, without associated inflammatory changes to suggest acute cholecystitis. No intrahepatic or extrahepatic ductal dilatation. No choledocholithiasis is seen. Electronically Signed   By: Julian Hy M.D.   On: 01/05/2019 12:16   MR 3D Recon At Scanner  Result Date: 01/05/2019 CLINICAL DATA:  Acute pancreatitis, nausea/vomiting/abdominal pain, ETOH EXAM: MRI ABDOMEN WITHOUT CONTRAST  (INCLUDING MRCP) TECHNIQUE: Multiplanar multisequence MR imaging of the abdomen was performed. Heavily T2-weighted images of the biliary and pancreatic ducts were obtained, and three-dimensional MRCP images were rendered by post processing. COMPARISON:  None. FINDINGS: Lower chest: Small left and trace right pleural effusions. Hepatobiliary: Mild hepatic steatosis. No morphologic findings of cirrhosis. No focal hepatic lesion is seen. Mild gallbladder sludge (series 11/image 62). No cholelithiasis, gallbladder wall thickening, or inflammatory changes. Mild pericholecystic fluid/edema is likely related to the peripancreatic inflammatory process (described below). No intrahepatic or extrahepatic ductal dilatation. Common duct measures 4 mm. No choledocholithiasis is seen. Pancreas: Moderate peripancreatic fluid/ascites, likely reflecting acute pancreatitis. No pancreatic hemorrhage. Pancreatic necrosis cannot be assessed in the absence of intravenous contrast administration. No walled-off necrosis/pseudocyst. Spleen:  Within normal limits. Adrenals/Urinary Tract:  Adrenal glands are within normal limits. Kidneys are within normal limits, noting mild right perinephric fluid. No hydronephrosis. Stomach/Bowel: Stomach is within normal limits, noting mild fluid along the gastric cardia. Visualized bowel is unremarkable. Vascular/Lymphatic:  No evidence of abdominal aortic aneurysm. No suspicious  abdominal lymphadenopathy. Other: Moderate upper abdominal ascites centered along the pancreas and right upper quadrant.  Musculoskeletal: No focal osseous lesions. IMPRESSION: Acute pancreatitis with moderate abdominal ascites. No walled-off necrosis/pseudocyst. Mild gallbladder sludge, without associated inflammatory changes to suggest acute cholecystitis. No intrahepatic or extrahepatic ductal dilatation. No choledocholithiasis is seen. Electronically Signed   By: Julian Hy M.D.   On: 01/05/2019 12:16   DG Chest Port 1 View  Result Date: 01/06/2019 CLINICAL DATA:  Shortness of breath today, dyspnea EXAM: PORTABLE CHEST 1 VIEW COMPARISON:  Portable exam 1408 hours compared to 05/24/2018 FINDINGS: Normal heart size and mediastinal contours. Dense consolidation of LEFT lower lobe question atelectasis versus infiltrate. Minimal atelectasis or infiltrate at medial RIGHT lung base. Decreased lung volumes. Cannot exclude small LEFT pleural effusion. No pneumothorax or acute osseous findings. IMPRESSION: Atelectasis versus consolidation LEFT lower lobe and minimally at medial RIGHT lung base. Question small LEFT pleural effusion. Electronically Signed   By: Lavonia Dana M.D.   On: 01/06/2019 14:17     Medications:   . lactated ringers 75 mL/hr at 01/06/19 1549  . potassium chloride 10 mEq (01/07/19 0941)   . Chlorhexidine Gluconate Cloth  6 each Topical Daily  . folic acid  1 mg Oral Daily  . heparin  5,000 Units Subcutaneous Q8H  . LORazepam  0-4 mg Intravenous Q12H  . multivitamin with minerals  1 tablet Oral Daily  . pantoprazole  40 mg Oral Q1200  . sucralfate  1 g Oral TID WC & HS  . thiamine  100 mg Oral Daily   Or  . thiamine  100 mg Intravenous Daily   acetaminophen, bisacodyl, LORazepam **OR** LORazepam, morphine injection, ondansetron **OR** ondansetron (ZOFRAN) IV, oxyCODONE, phenol, polyethylene glycol  Assessment/ Plan:   Acute kidney injury in setting of acute pancreatitis with massive volume resuscitation.  Urinalysis has been bland with no white blood cells no red blood cells no evidence  of obstruction on renal ultrasound it looks like acute tubular necrosis.  This is probably ischemic with no administration of nephrotoxins.  Blood pressure has improved.  She is still oliguric.  Hypertension/volume appears to be adequately volume resuscitated we will see if she responds to Lasix diuresis.  I will give IV Lasix 80 mg every 12 hours  Metabolic acidosis improved with IV bicarbonate this has been discontinued she continues on lactated Ringer's at 75 cc an hour.  I think we can hold on the lactate Ringer's today  Hypokalemia replete per primary service I see that she had runs of 40 mEq total this morning  Hypocalcemia continues to replete with 2 g calcium gluconate  Hypophosphatemia we will give IV phosphate 30 mmol   LOS: 3 Sherril Croon @TODAY @10 :19 AM

## 2019-01-07 NOTE — Progress Notes (Signed)
NAME:  Yolanda Flores, MRN:  [id number], DOB:  [birth year], LOS: 3 ADMISSION DATE:  01/04/2019, CONSULTATION DATE:  12/24 REFERRING MD:  Justin Mend, CHIEF COMPLAINT:  AKI   Brief History   33 year old female with history of hypertension and EtOH abuse admitted on 12/22 with nausea vomiting, and abdominal pain.  Found to have acute pancreatitis, elevated LFTs and acute kidney injury.  Treated with IV hydration and bowel rest.  LFTs and lipase all improving however on 12/24 in spite of supportive care serum creatinine continues to rise.  Critical care asked to evaluate given concern for possible need for dialysis  History of present illness   This is a 33 year old female who was admitted on 12/22 with chief complaint of nausea and vomiting and abdominal pain.  This was first noted on 12/20, primarily involving the epigastric and left upper quadrant without radiation.  As noted was associated with frequent amounts of nausea and vomiting.  Also reported having discomfort with swallowing.  ER evaluation was notable for AKI, elevated LFTs AST 1669, ALT 285, T bili 2.6, elevated lipase 4101, ultrasound of abdomen was negative for gallstones or gallbladder wall thickening.  She was admitted with working diagnosis of acute  pancreatitis she was seen in consultation by GI medicine, surgery, and nephrology.  She was hypotensive meeting SIRS parameters and started on IV hydration Hospital course: 12/23 LFTs improving AST 538, ALT 160, T bili 1.5, lipase decline from 4101 to 1642.  Ultrasound was negative for cholelithiasis he underwent MRCP which showed gallbladder sludge with associated inflammatory changes with pancreatitis and associated ascites there was no biliary dilation or choledocholithiasis GI noted clinical picture not completely c/w alcohol related pancreatitis.  They question the etiology of pancreatitis specifically could this have been drug-induced?  Or possibly prior stone.  Also question the possibility  of autoimmune pancreatitis as an additional contributing factor.  Started on bicarbonate infusion for worsening acidosis 12/24: Creatinine continuing to rise had been 3.3 on admission discontinued to increase.  From time of admission she had received 10 L of fluid, serum creatinine now up to 4.19. Lipase down to 622,  AST 185, ALT 90, Tbili 0.9 Nephrology was consulted and due to concern that she may ultimately require dialysis critical care was asked to evaluate  Past Medical History  Hypertension on lisinopril, tobacco abuse, alcohol consumption, Endorses drinking at least 3 times a week.  Was hospitalized in May 2020 for alcohol withdrawal.  Has history of elevated LFTs in the setting of alcohol.  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  Ultrasound was negative for cholelithiasis he underwent MRCP which showed gallbladder sludge with associated inflammatory changes with pancreatitis and associated ascites there was no biliary dilation or choledocholithiasis GI noted clinical picture not completely c/w alcohol related pancreatitis.  They question the etiology of pancreatitis specifically could this have been drug-induced?  Or possibly prior stone.  Also question the possibility of autoimmune pancreatitis as an additional contributing factor.  Started on bicarbonate infusion for worsening acidosis 12/24: Creatinine continuing to rise had been 3.3 on admission discontinued to increase.  From time of admission she had received 10 L of fluid, serum creatinine now up to 4.19. Lipase down to 622,  AST 185, ALT 90, Tbili 0.9 Nephrology was consulted and due to concern that she may ultimately require dialysis critical  care was asked to evaluate  Consults:  GI consulted 12/23 Surgical services consulted 12/24 Nephrology consulted  12/24 Pulmonary/critical care consulted 12/24  Procedures:    Significant Diagnostic Tests:  12/22 ultrasound of the abdomen: Mild pericholecystic fluid.  No gallbladder wall thickening or gallstones. 12/23 MR abdomen with MRCP without contrast.  Small trace left and right pleural effusions.  Mild hepatic steatosis but no morphological findings consistent with cirrhosis.  Mild gallbladder sludge, no cholelithiasis, gallbladder wall thickening or inflammatory changes.  Mild pericholecystic fluid/edema moderate peripancreatic fluid/ascites , No walled off necrosis or pseudocyst. 12/23 MR 3D recon scanner acute pancreatitis with moderate abdominal ascites no necrosis or pseudocyst mild gallbladder sludge without associated inflammatory changes 12/23: Renal ultrasound echogenic kidneys bilaterally no obstruction  Micro Data:  COVID-19 12/22: Negative MRSA screen: Negative  Antimicrobials:     Interim history/subjective:  Still having some central chest pain with swallowing. No abdominal pain. Some nausea with certain foods, but overall PO intake has been ok.   Objective   Blood pressure (!) 169/123, pulse (!) 110, temperature 100.2 F (37.9 C), temperature source Oral, resp. rate (!) 24, height 5\' 7"  (1.702 m), weight 80 kg, last menstrual period 12/14/2018, SpO2 96 %.        Intake/Output Summary (Last 24 hours) at 01/07/2019 1351 Last data filed at 01/07/2019 1300 Gross per 24 hour  Intake 1719.11 ml  Output 99 ml  Net 1620.11 ml   Filed Weights   01/04/19 0939 01/05/19 1800 01/07/19 0500  Weight: 65.8 kg 76.3 kg 80 kg    Examination: General: Ill-appearing young woman lying in bed in no acute distress HENT: [place]/AT, eyes anicteric, oral mucosa moist Lungs: Faint rales in left base, otherwise clear to auscultation bilaterally.  Breathing comfortably on room air. Cardiovascular: Tachycardic, regular rhythm Abdomen: Soft, minimally tender to palpation Extremities: No  peripheral edema, no clubbing or cyanosis Neuro: Awake and alert, answering questions appropriately, positioning herself in bed independently. GU: Minimal urine output  Resolved Hospital Problem list   Nausea and vomiting  Assessment & Plan:  Acute pancreatitis-improving clinically and on labs -continue p.o. intake as tolerated -Trial of GI cocktail with lidocaine -no indication for antibiotics unless clinical deterioration  AKI with oliguria-no urgent indications for dialysis -continue to monitor labs -Strict I/O -Avoid nephrotoxic meds and renally dose medications -Per nephrology, stopping IV fluids and starting diuretics -Cautious electrolyte repletion  Hypocalcemia, hypophosphatemia, hypokalemia -repletion IV and p.o. -Repeat BMP, Phos, mag this afternoon  History of alcohol abuse. No current signs of withdrawal. 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  Iron studies suggest due to acute versus chronic illness -continue supplemental vitamins -Encourage oral intake   Best practice:  Diet: Clears Pain/Anxiety/Delirium protocol (if indicated): Not applicable VAP protocol (if indicated): Not applicable DVT prophylaxis: Heparin subcutaneously every 8 hours GI prophylaxis: PPI and Carafate Glucose control: Pending Mobility: Bedrest Code Status: Full code Family Communication: Per primary Disposition: Continue current supportive care. No urgent need for dialysis but we will be on board to assist with line placement.   Labs   CBC: Recent Labs  Lab 01/04/19 0943 01/04/19 2208 01/05/19 0414  01/06/19 0854 01/07/19 0943  WBC 16.4* 8.3 7.4 9.2  9.7  NEUTROABS 15.6*  --  6.5 8.0* PENDING  HGB 14.6 12.3 12.4 10.1* 9.8*  HCT 42.0 35.1* 37.6 28.9* 28.6*  MCV 95.2 96.2 99.2 95.4 95.7  PLT 333 180 176 146* [id number]    Basic Metabolic Panel: Recent Labs  Lab 01/04/19 1041 01/05/19 0414 01/06/19 0854 01/07/19 0209 01/07/19 0943  NA 131* 133* 134* 134*  --   K 4.4 3.9 3.2* 3.3*  --   CL 92* 105 97* 97*  --   CO2 19* 13* 22 21*  --   GLUCOSE 139* 106* 165* 108*  --   BUN 36* 43* 58* 65*  --   CREATININE 3.30* 4.19* 6.75* 7.92*  --   CALCIUM 7.1* 5.9* 5.4* 6.1*  --   MG  --  1.3*  --   --  1.8  PHOS  --  1.0*  --   --  <1.0*   GFR: Estimated Creatinine Clearance: 11.2 mL/min (A) (by C-G formula based on SCr of 7.92 mg/dL (H)). Recent Labs  Lab 01/04/19 0943 01/04/19 2208 01/05/19 0414 01/06/19 0854 01/07/19 0943  WBC 16.4* 8.3 7.4 9.2 9.7  LATICACIDVEN 2.4*  --   --   --   --     Liver Function Tests: Recent Labs  Lab 01/04/19 1041 01/05/19 0414 01/06/19 0854 01/07/19 0209  AST 1,669* 538* 185* 115*  ALT 285* 160* 90* 71*  ALKPHOS 74 85 96 98  BILITOT 2.6* 1.5* 0.9 0.9  PROT 7.1 6.1* 5.6* 5.8*  ALBUMIN 3.8 3.0* 2.6* 2.6*   Recent Labs  Lab 01/04/19 1041 01/05/19 0414 01/06/19 0854 01/07/19 0943  LIPASE 4,101* 1,642* 622* 196*   No results for input(s): AMMONIA in the last 168 hours.  ABG    Component Value Date/Time   TCO2 27 11/25/2007 0635     Coagulation Profile: No results for input(s): INR, PROTIME in the last 168 hours.  Cardiac Enzymes: No results for input(s): CKTOTAL, CKMB, CKMBINDEX, TROPONINI in the last 168 hours.  HbA1C: Hgb A1c MFr Bld  Date/Time Value Ref Range Status  01/05/2019 04:14 AM 4.9 4.8 - 5.6 % Final    Comment:    (NOTE) Pre diabetes:          5.7%-6.4% Diabetes:              >6.4% Glycemic control for   <7.0% adults with diabetes   05/25/2018 04:03 AM 5.0 4.8 - 5.6 % Final    Comment:    (NOTE) Pre  diabetes:          5.7%-6.4% Diabetes:              >6.4% Glycemic control for   <7.0% adults with diabetes     CBG: Recent Labs  Lab 01/05/19 1209 01/06/19 0745 01/07/19 Amalga Kalen Neidert, DO 01/07/19 2:01 PM [facility]

## 2019-01-07 NOTE — Progress Notes (Signed)
PROGRESS NOTE    Yolanda Flores  [id number] DOB: 27-Apr-1985 DOA: 01/04/2019 PCP: Patient, No Pcp Per   Brief Narrative:  Yolanda Flores is a 33 y.o. female with medical history for hypertension, GERD, alcohol abuse who presents with chief complaint of nausea vomiting abdominal pain that started Sunday.  Patient states that she was drinking on Sunday had a couple shots and subsequently she developed insidious abdominal pain she states the pain has been constant since Sunday and has been laying in bed due to the pain and is not able to sleep.  She states it hurts in the left upper quadrant and mid abdomen but does not radiate to the back.  Never had this type of pain before and states that she has been drinking shots previously.  Has been taking Protonix for epigastric pain and she also endorses chest discomfort while swallowing.  Because of these issues she came to the emergency room for further evaluation and she was found to have acute pancreatitis.  TRH was asked to admit this patient for acute pancreatitis and gastroenterology was consulted for further evaluation recommendations  ED Course: In the ED she had basic blood work and was given pain control and given 2 L of saline boluses and placed on maintenance IV fluid.  Gastroenterology was consulted for further evaluation recommendations.  Of note SARS-CoV-2 testing was negative  **Interim History Her LFTs have improved however her renal function worsened so nephrology was consulted.  GI is on board and asked general surgery to further evaluate as well and currently there are no acute surgical issues.  Her renal function is still worsening and nephrology is following and they have adequately resuscitated the patient with her fluids and I have now started the IV Lasix 80 mg every 12 and her holding IV fluids.  Electrolytes have been repleted.  Because of concern of possible dialysis critical care was consulted  Assessment & Plan:   Active  Problems:   HTN (hypertension)   Lactic acidosis   Alcohol abuse   Abnormal LFTs   Acute gallstone pancreatitis   Pancreatitis  SIRS from Acute Pancreatitis and now also has an acute peripancreatic fluid collection -On Admission Presented with tachypnea, tachycardia, leukocytosis and a pancreatitis -Has been having sporadic episodes of low blood pressure which may be contributing to her renal dysfunction and worsening kidney -Unclear Etiology of her pancreatitis but could be biliary versus alcohol induced as she drinks at least 3 times a week; also could be from shock liver versus a common bile duct stone however do not feel that there is a common bile duct stone in general surgery has been consulted and feels no surgical intervention is necessary -Patient's lipase level is over 4000 LFTs were also abnormal but have now improved; lipase level is now 196 and AST and ALT have improved as well with an AST of 115 and ALT of 71 down significantly from admission -N.p.o. and bowel rest was initiated but she was started on a clear liquid diet per pulmonary critical care and now on a full liquid diet and gradually advancing full liquid diet as tolerated -Pulmonary critical care was consulted for further evaluation recommendations given the possible need for dialysis -Continued IV fluid hydration however this has been now stopped and held from nephrology and they are starting diuresis with IV Lasix 80 mg every 12 -Ig G for autoimmune pancreatitis antibodies is still pending -She underwent MRCP on 01/05/2019 which showed gallbladder sludge associate inflammatory changes and  pancreatitis severe ascites.  There is acute pancreatitis with moderate abdominal ascites with no walled off pseudocyst or necrosis.  There is no evidence of very dilatation or choledocholithiasis; however there is no IV contrast on MRCP so necrosis cannot be assessed -HIDA scan was also ordered -Continue to monitor trend LFTs and lipase  level -Continue with pain control -Avoid hepatotoxic and nephrotoxic medications -General surgery was consulted given her gallbladder sludge and they do not think intervention is indicated this time and appreciate further evaluation recommendations -Continue for the stepdown monitoring for now -She is spiking temperatures and is febrile and tachypneic and tachycardic today but feels that her abdominal pain is improving; now complaining some diarrhea -Critical care is now trying GI cocktail with lidocaine -Currently no indication for antibiotics at this time  Hyponatremia/Hypocholremia -Patient sodium is 131 and chloride level of 92 at the time of admission was slowly improving; sodium is now 134 and chloride is now 97 -IV fluid hydration as above has now been discontinued by nephrology and she is being started on IV diuresis  Lactic Acidosis -Patient's lactic acid level on admission was 2.4 -IV fluid hydration as above now stopped as above by nephrology -Continued to monitor and trend  AKI, worsening  Elevated anion gap metabolic acidosis -in the setting of nausea vomiting and dehydration from pancreatitis -Avoid nephrotoxic medications and will hold her lisinopril -Patient is BUN/creatinine went from 36/3.30 and is now 65/7.92 -Patient's CO2 on admission was 19, chloride was 92, and anion gap was 20; now patient CO2 is 21, chloride level is 97, as well as anion gap being 16 -IV fluid hydration has now been discontinued and nephrology starting IV Lasix 80 mg twice daily -Avoid nephrotoxic medications, contrast dyes, hypotension and renally adjust medications -Continue to monitor and trend renal function next-repeat CMP in a.m.  Hyperglycemia -Patient blood sugar on admission was 139  -Checked hemoglobin A1c and was 4.9 -If necessary will place on sensitive NovoLog sliding scale insulin  Leukocytosis, improved -Patient's WBC was 16.4 and is now 9.7 -In the setting of  pancreatitis -Continue to monitor for signs and symptoms of infection -Repeat CBC in a.m.  Abnormal LFTs/alcoholic hepatitis, improving -Patient's AST was 1669 and ALT was 285 -Checked a right upper quadrant ultrasound as well as an acute hepatitis panel which was negative -Ultrasound was done as well as an MRCP see above -Continue to monitor and trend LFTs and may need further work-up and imaging and will defer to GI for this next -Repeat CMP in a.m.  Essential Hypertension -Was elevated on admission at 169/123 -We will place on IV hydralazine as we are holding her lisinopril -Continue metoprolol  Alcohol abuse -Patient drinks at least 3 times a week -We will place on CIWA protocol and continue with folic acid, multivitamin and thiamine -Currently not in withdrawal  GERD -PPI with Pantoprazole 40 mg po Daily   HypoKalemia -Patient's Potassium was 3.3 this AM -Repelte with IV KCl 40 mEQ and with IV K Phos 30 mmol -Continue to Monitor and Replete as Necessary -Repeat CMP in AM   Hypomagnesemia -Improved -Mag Level was 1.8 but still replete with IV Mag Sulfate 1 gram -Continue to Monitor and Replete as Necessary -Repeat Mag Level in the AM   Hypophosphatemia -Patient's Phos Level was severely low at <1.0 -Replete with IV K Phos 30 mmol along with Phos-NAK 2 packets po q4h x 2 doses -Continue to Monitor and Replete as Necessary -Repeat Phos Level in AM  Hypocalcemia -  Ca2+ was 6.1 -Repelte with IV Ca2+ Gluconate 2 grams -Continue to Monitor and Replete as Necessary -Repeat CMP in AM   Normocytic Anemia -Likely dilutional drop or in the setting of renal disease, hepatic disease and bone marrow toxicity from alcohol and poor nutritional reserves -Patient's hemoglobin/hematocrit went from 12.4/37.6 on admission is now 9.8/28.6 -Checked Anemia Panel this AM and it showed an iron level of 27, U IBC of 206, TIBC of 233, saturation ratios of 12%, ferritin level of 593,  folate level of 14.3 -Continue to monitor for signs and symptoms of bleeding; currently no overt bleeding noted -Repeat CBC in a.m.   DVT prophylaxis: Heparin 5000 units subcu every 8 Code Status: FULL CODE  Family Communication: No family present at bedside  Disposition Plan: Remain in SDU for now  Consultants:   Gastroenterology  General Surgery  Nephrology  PCCM/Pulmonary   Procedures:  None  Antimicrobials:  Anti-infectives (From admission, onward)   None     Subjective: And examined at bedside and states that her abdominal pain is improved and did not realize that she had temperature this morning.  Now having some diarrhea.  Feels a little bit better.  No other concerns or complaints at this time  Objective: Vitals:   01/07/19 0400 01/07/19 0500 01/07/19 0600 01/07/19 0700  BP: (!) 158/122 (!) 167/116 (!) 160/109 (!) 150/99  Pulse: (!) 120 (!) 113 (!) 116 (!) 114  Resp:    (!) 27  Temp:      TempSrc:      SpO2: 96% 97% 96% 96%  Weight:  80 kg    Height:        Intake/Output Summary (Last 24 hours) at 01/07/2019 0754 Last data filed at 01/07/2019 0600 Gross per 24 hour  Intake 1213.56 ml  Output 108 ml  Net 1105.56 ml   Filed Weights   01/04/19 0939 01/05/19 1800 01/07/19 0500  Weight: 65.8 kg 76.3 kg 80 kg   Examination: Physical Exam:  Constitutional: WN/WD overweight female in mild distress appears calm but slightly uncomfortable Eyes: Lids and conjunctivae normal, sclerae anicteric  ENMT: External Ears, Nose appear normal. Grossly normal hearing. Mucous membranes are moist. Neck: Appears normal, supple, no cervical masses, normal ROM, no appreciable thyromegaly; no JVD Respiratory: Diminished to auscultation bilaterally, no wheezing, rales, rhonchi or crackles.  He is slightly tachypneic but not using any accessory muscles.  Not wearing any supplemental oxygen via nasal cannula Cardiovascular: Tachycardic rate but regular rhythm, no murmurs /  rubs / gallops. S1 and S2 auscultated. No extremity edema. 2+ pedal pulses. No carotid bruits.  Abdomen: Soft, mildy tender, Distended. No masses palpated. Bowel sounds positive x4.  GU: Deferred. Musculoskeletal: No clubbing / cyanosis of digits/nails. No joint deformity upper and lower extremities.   Skin: No rashes, lesions, ulcers on limited skin evaluation. No induration; Warm and dry.  Neurologic: CN 2-12 grossly intact with no focal deficits. Romberg sign and cerebellar reflexes not assessed.  Psychiatric: Normal judgment and insight. Alert and oriented x 3. Calm mood and appropriate affect.   Data Reviewed: I have personally reviewed following labs and imaging studies  CBC: Recent Labs  Lab 01/04/19 0943 01/04/19 2208 01/05/19 0414 01/06/19 0854  WBC 16.4* 8.3 7.4 9.2  NEUTROABS 15.6*  --  6.5 8.0*  HGB 14.6 12.3 12.4 10.1*  HCT 42.0 35.1* 37.6 28.9*  MCV 95.2 96.2 99.2 95.4  PLT 333 180 176 [id number]*   Basic Metabolic Panel: Recent Labs  Lab 01/04/19  1041 01/05/19 0414 01/06/19 0854 01/07/19 0209  NA 131* 133* 134* 134*  K 4.4 3.9 3.2* 3.3*  CL 92* 105 97* 97*  CO2 19* 13* 22 21*  GLUCOSE 139* 106* 165* 108*  BUN 36* 43* 58* 65*  CREATININE 3.30* 4.19* 6.75* 7.92*  CALCIUM 7.1* 5.9* 5.4* 6.1*  MG  --  1.3*  --   --   PHOS  --  1.0*  --   --    GFR: Estimated Creatinine Clearance: 11.2 mL/min (A) (by C-G formula based on SCr of 7.92 mg/dL (H)). Liver Function Tests: Recent Labs  Lab 01/04/19 1041 01/05/19 0414 01/06/19 0854 01/07/19 0209  AST 1,669* 538* 185* 115*  ALT 285* 160* 90* 71*  ALKPHOS 74 85 96 98  BILITOT 2.6* 1.5* 0.9 0.9  PROT 7.1 6.1* 5.6* 5.8*  ALBUMIN 3.8 3.0* 2.6* 2.6*   Recent Labs  Lab 01/04/19 1041 01/05/19 0414 01/06/19 0854  LIPASE 4,101* 1,642* 622*   No results for input(s): AMMONIA in the last 168 hours. Coagulation Profile: No results for input(s): INR, PROTIME in the last 168 hours. Cardiac Enzymes: No results for  input(s): CKTOTAL, CKMB, CKMBINDEX, TROPONINI in the last 168 hours. BNP (last 3 results) No results for input(s): PROBNP in the last 8760 hours. HbA1C: Recent Labs    01/05/19 0414  HGBA1C 4.9   CBG: Recent Labs  Lab 01/05/19 1209 01/06/19 0745 01/07/19 0727  GLUCAP 107* 155* 113*   Lipid Profile: No results for input(s): CHOL, HDL, LDLCALC, TRIG, CHOLHDL, LDLDIRECT in the last 72 hours. Thyroid Function Tests: Recent Labs    01/05/19 0414  TSH 0.567   Anemia Panel: No results for input(s): VITAMINB12, FOLATE, FERRITIN, TIBC, IRON, RETICCTPCT in the last 72 hours. Sepsis Labs: Recent Labs  Lab 01/04/19 0943  LATICACIDVEN 2.4*    Recent Results (from the past 240 hour(s))  Respiratory Panel by RT PCR (Flu A&B, Covid) - Nasopharyngeal Swab     Status: None   Collection Time: 01/04/19  1:08 PM   Specimen: Nasopharyngeal Swab  Result Value Ref Range Status   SARS Coronavirus 2 by RT PCR NEGATIVE NEGATIVE Final    Comment: (NOTE) SARS-CoV-2 target nucleic acids are NOT DETECTED. The SARS-CoV-2 RNA is generally detectable in upper respiratoy specimens during the acute phase of infection. The lowest concentration of SARS-CoV-2 viral copies this assay can detect is 131 copies/mL. A negative result does not preclude SARS-Cov-2 infection and should not be used as the sole basis for treatment or other patient management decisions. A negative result may occur with  improper specimen collection/handling, submission of specimen other than nasopharyngeal swab, presence of viral mutation(s) within the areas targeted by this assay, and inadequate number of viral copies (<131 copies/mL). A negative result must be combined with clinical observations, patient history, and epidemiological information. The expected result is Negative. Fact Sheet for Patients:  PinkCheek.be Fact Sheet for Healthcare Providers:   GravelBags.it This test is not yet ap proved or cleared by the [place] FDA and  has been authorized for detection and/or diagnosis of SARS-CoV-2 by FDA under an Emergency Use Authorization (EUA). This EUA will remain  in effect (meaning this test can be used) for the duration of the COVID-19 declaration under Section 564(b)(1) of the Act, 21 U.S.C. section 360bbb-3(b)(1), unless the authorization is terminated or revoked sooner.    Influenza A by PCR NEGATIVE NEGATIVE Final   Influenza B by PCR NEGATIVE NEGATIVE Final    Comment: (NOTE)  The Xpert Xpress SARS-CoV-2/FLU/RSV assay is intended as an aid in  the diagnosis of influenza from Nasopharyngeal swab specimens and  should not be used as a sole basis for treatment. Nasal washings and  aspirates are unacceptable for Xpert Xpress SARS-CoV-2/FLU/RSV  testing. Fact Sheet for Patients: PinkCheek.be Fact Sheet for Healthcare Providers: GravelBags.it This test is not yet approved or cleared by the [place] FDA and  has been authorized for detection and/or diagnosis of SARS-CoV-2 by  FDA under an Emergency Use Authorization (EUA). This EUA will remain  in effect (meaning this test can be used) for the duration of the  Covid-19 declaration under Section 564(b)(1) of the Act, 21  U.S.C. section 360bbb-3(b)(1), unless the authorization is  terminated or revoked. Performed at [facility], [place] [address], [place], [place] [address]   MRSA PCR Screening     Status: None   Collection Time: 01/06/19  3:40 AM   Specimen: Nasal Mucosa; Nasopharyngeal  Result Value Ref Range Status   MRSA by PCR NEGATIVE NEGATIVE Final    Comment:        The GeneXpert MRSA Assay (FDA approved for NASAL specimens only), is one component of a comprehensive MRSA colonization surveillance program. It is not intended to diagnose MRSA infection  nor to guide or monitor treatment for MRSA infections. Performed at [facility], [place] [address], [place], [place] [address]      RN Pressure Injury Documentation:     Estimated body mass index is 27.62 kg/m as calculated from the following:   Height as of this encounter: 5\' 7"  (1.702 m).   Weight as of this encounter: 80 kg.  Malnutrition Type:      Malnutrition Characteristics:      Nutrition Interventions:           Radiology Studies: US RENAL  Result Date: 01/05/2019 CLINICAL DATA:  Acute kidney insufficiency. Nausea and vomiting. Dehydration. EXAM: RENAL / URINARY TRACT ULTRASOUND COMPLETE COMPARISON:  None. FINDINGS: Right Kidney: Renal measurements: 12.5 x 6.3 x 5.5 cm = volume: 230 mL. The parenchyma is diffusely echogenic. There is no stone or mass lesion. No hydronephrosis is present. Left Kidney: Renal measurements: 12.5 x 5.7 x 5.1 cm = volume: 188 mL. The parenchyma is diffusely echogenic. There is no stone or mass lesion. No hydronephrosis is present. Bladder: Appears normal for degree of bladder distention. Other: Small amount of ascites is present. Perinephric fluid is evident bilaterally. IMPRESSION: 1. Echogenic kidneys bilaterally. This is nonspecific, but can be seen in the setting of medical renal disease. No obstructive disease is present. 2. Abdominal ascites. Electronically Signed   By: San Morelle M.D.   On: 01/05/2019 19:03   MR ABDOMEN MRCP WO CONTRAST  Result Date: 01/05/2019 CLINICAL DATA:  Acute pancreatitis, nausea/vomiting/abdominal pain, ETOH EXAM: MRI ABDOMEN WITHOUT CONTRAST  (INCLUDING MRCP) TECHNIQUE: Multiplanar multisequence MR imaging of the abdomen was performed. Heavily T2-weighted images of the biliary and pancreatic ducts were obtained, and three-dimensional MRCP images were rendered by post processing. COMPARISON:  None. FINDINGS: Lower chest: Small left and trace right pleural effusions.  Hepatobiliary: Mild hepatic steatosis. No morphologic findings of cirrhosis. No focal hepatic lesion is seen. Mild gallbladder sludge (series 11/image 62). No cholelithiasis, gallbladder wall thickening, or inflammatory changes. Mild pericholecystic fluid/edema is likely related to the peripancreatic inflammatory process (described below). No intrahepatic or extrahepatic ductal dilatation. Common duct measures 4 mm. No choledocholithiasis is seen. Pancreas: Moderate peripancreatic fluid/ascites, likely reflecting acute  pancreatitis. No pancreatic hemorrhage. Pancreatic necrosis cannot be assessed in the absence of intravenous contrast administration. No walled-off necrosis/pseudocyst. Spleen:  Within normal limits. Adrenals/Urinary Tract:  Adrenal glands are within normal limits. Kidneys are within normal limits, noting mild right perinephric fluid. No hydronephrosis. Stomach/Bowel: Stomach is within normal limits, noting mild fluid along the gastric cardia. Visualized bowel is unremarkable. Vascular/Lymphatic:  No evidence of abdominal aortic aneurysm. No suspicious abdominal lymphadenopathy. Other: Moderate upper abdominal ascites centered along the pancreas and right upper quadrant. Musculoskeletal: No focal osseous lesions. IMPRESSION: Acute pancreatitis with moderate abdominal ascites. No walled-off necrosis/pseudocyst. Mild gallbladder sludge, without associated inflammatory changes to suggest acute cholecystitis. No intrahepatic or extrahepatic ductal dilatation. No choledocholithiasis is seen. Electronically Signed   By: Julian Hy M.D.   On: 01/05/2019 12:16   MR 3D Recon At Scanner  Result Date: 01/05/2019 CLINICAL DATA:  Acute pancreatitis, nausea/vomiting/abdominal pain, ETOH EXAM: MRI ABDOMEN WITHOUT CONTRAST  (INCLUDING MRCP) TECHNIQUE: Multiplanar multisequence MR imaging of the abdomen was performed. Heavily T2-weighted images of the biliary and pancreatic ducts were obtained, and  three-dimensional MRCP images were rendered by post processing. COMPARISON:  None. FINDINGS: Lower chest: Small left and trace right pleural effusions. Hepatobiliary: Mild hepatic steatosis. No morphologic findings of cirrhosis. No focal hepatic lesion is seen. Mild gallbladder sludge (series 11/image 62). No cholelithiasis, gallbladder wall thickening, or inflammatory changes. Mild pericholecystic fluid/edema is likely related to the peripancreatic inflammatory process (described below). No intrahepatic or extrahepatic ductal dilatation. Common duct measures 4 mm. No choledocholithiasis is seen. Pancreas: Moderate peripancreatic fluid/ascites, likely reflecting acute pancreatitis. No pancreatic hemorrhage. Pancreatic necrosis cannot be assessed in the absence of intravenous contrast administration. No walled-off necrosis/pseudocyst. Spleen:  Within normal limits. Adrenals/Urinary Tract:  Adrenal glands are within normal limits. Kidneys are within normal limits, noting mild right perinephric fluid. No hydronephrosis. Stomach/Bowel: Stomach is within normal limits, noting mild fluid along the gastric cardia. Visualized bowel is unremarkable. Vascular/Lymphatic:  No evidence of abdominal aortic aneurysm. No suspicious abdominal lymphadenopathy. Other: Moderate upper abdominal ascites centered along the pancreas and right upper quadrant. Musculoskeletal: No focal osseous lesions. IMPRESSION: Acute pancreatitis with moderate abdominal ascites. No walled-off necrosis/pseudocyst. Mild gallbladder sludge, without associated inflammatory changes to suggest acute cholecystitis. No intrahepatic or extrahepatic ductal dilatation. No choledocholithiasis is seen. Electronically Signed   By: Julian Hy M.D.   On: 01/05/2019 12:16   DG Chest Port 1 View  Result Date: 01/06/2019 CLINICAL DATA:  Shortness of breath today, dyspnea EXAM: PORTABLE CHEST 1 VIEW COMPARISON:  Portable exam 1408 hours compared to 05/24/2018  FINDINGS: Normal heart size and mediastinal contours. Dense consolidation of LEFT lower lobe question atelectasis versus infiltrate. Minimal atelectasis or infiltrate at medial RIGHT lung base. Decreased lung volumes. Cannot exclude small LEFT pleural effusion. No pneumothorax or acute osseous findings. IMPRESSION: Atelectasis versus consolidation LEFT lower lobe and minimally at medial RIGHT lung base. Question small LEFT pleural effusion. Electronically Signed   By: Lavonia Dana M.D.   On: 01/06/2019 14:17        Scheduled Meds:  Chlorhexidine Gluconate Cloth  6 each Topical Daily   folic acid  1 mg Oral Daily   heparin  5,000 Units Subcutaneous Q8H   LORazepam  0-4 mg Intravenous Q12H   multivitamin with minerals  1 tablet Oral Daily   pantoprazole  40 mg Oral Q1200   sucralfate  1 g Oral TID WC & HS   thiamine  100 mg Oral Daily   Or  thiamine  100 mg Intravenous Daily   Continuous Infusions:  calcium gluconate     lactated ringers 75 mL/hr at 01/06/19 1549   potassium chloride      LOS: 3 days   Yolanda Elbe, DO [facility] PAGER is on AMION  If 7PM-7AM, please contact night-coverage [email or web address]

## 2019-01-07 NOTE — Progress Notes (Signed)
CRITICAL VALUE ALERT  Critical Value:  Ca+ 6.1  Date & Time Notied:  01/07/19 @ 0414  Provider Notified: [facility] on call  Orders Received/Actions taken: Awaiting orders

## 2019-01-08 DIAGNOSIS — R579 Shock, unspecified: Secondary | ICD-10-CM | POA: Diagnosis present

## 2019-01-08 DIAGNOSIS — N179 Acute kidney failure, unspecified: Secondary | ICD-10-CM

## 2019-01-08 DIAGNOSIS — N170 Acute kidney failure with tubular necrosis: Secondary | ICD-10-CM

## 2019-01-08 LAB — BASIC METABOLIC PANEL
Anion gap: 16 — ABNORMAL HIGH (ref 5–15)
BUN: 78 mg/dL — ABNORMAL HIGH (ref 6–20)
CO2: 19 mmol/L — ABNORMAL LOW (ref 22–32)
Calcium: 6.5 mg/dL — ABNORMAL LOW (ref 8.9–10.3)
Chloride: 96 mmol/L — ABNORMAL LOW (ref 98–111)
Creatinine, Ser: 10.07 mg/dL — ABNORMAL HIGH (ref 0.44–1.00)
GFR calc Af Amer: 5 mL/min — ABNORMAL LOW (ref >60)
GFR calc non Af Amer: 5 mL/min — ABNORMAL LOW (ref >60)
Glucose, Bld: 130 mg/dL — ABNORMAL HIGH (ref 70–99)
Potassium: 3.1 mmol/L — ABNORMAL LOW (ref 3.5–5.1)
Sodium: 131 mmol/L — ABNORMAL LOW (ref 135–145)

## 2019-01-08 LAB — CBC WITH DIFFERENTIAL/PLATELET
Abs Immature Granulocytes: 0.72 10*3/uL — ABNORMAL HIGH (ref 0.00–0.07)
Basophils Absolute: 0.1 10*3/uL (ref 0.0–0.1)
Basophils Relative: 1 %
Eosinophils Absolute: 0.1 10*3/uL (ref 0.0–0.5)
Eosinophils Relative: 1 %
HCT: 28.5 % — ABNORMAL LOW (ref 36.0–46.0)
Hemoglobin: 10.1 g/dL — ABNORMAL LOW (ref 12.0–15.0)
Immature Granulocytes: 7 %
Lymphocytes Relative: 6 %
Lymphs Abs: 0.6 10*3/uL — ABNORMAL LOW (ref 0.7–4.0)
MCH: 32.9 pg (ref 26.0–34.0)
MCHC: 35.4 g/dL (ref 30.0–36.0)
MCV: 92.8 fL (ref 80.0–100.0)
Monocytes Absolute: 2.4 10*3/uL — ABNORMAL HIGH (ref 0.1–1.0)
Monocytes Relative: 23 %
Neutro Abs: 6.4 10*3/uL (ref 1.7–7.7)
Neutrophils Relative %: 62 %
Platelets: 281 10*3/uL (ref 150–400)
RBC: 3.07 MIL/uL — ABNORMAL LOW (ref 3.87–5.11)
RDW: 14.5 % (ref 11.5–15.5)
WBC Morphology: INCREASED
WBC: 10.3 10*3/uL (ref 4.0–10.5)
nRBC: 0 % (ref 0.0–0.2)

## 2019-01-08 LAB — COMPREHENSIVE METABOLIC PANEL
ALT: 50 U/L — ABNORMAL HIGH (ref 0–44)
AST: 50 U/L — ABNORMAL HIGH (ref 15–41)
Albumin: 2.6 g/dL — ABNORMAL LOW (ref 3.5–5.0)
Alkaline Phosphatase: 102 U/L (ref 38–126)
Anion gap: 19 — ABNORMAL HIGH (ref 5–15)
BUN: 77 mg/dL — ABNORMAL HIGH (ref 6–20)
CO2: 20 mmol/L — ABNORMAL LOW (ref 22–32)
Calcium: 6.3 mg/dL — CL (ref 8.9–10.3)
Chloride: 94 mmol/L — ABNORMAL LOW (ref 98–111)
Creatinine, Ser: 9.62 mg/dL — ABNORMAL HIGH (ref 0.44–1.00)
GFR calc Af Amer: 6 mL/min — ABNORMAL LOW (ref >60)
GFR calc non Af Amer: 5 mL/min — ABNORMAL LOW (ref >60)
Glucose, Bld: 111 mg/dL — ABNORMAL HIGH (ref 70–99)
Potassium: 3 mmol/L — ABNORMAL LOW (ref 3.5–5.1)
Sodium: 133 mmol/L — ABNORMAL LOW (ref 135–145)
Total Bilirubin: 0.8 mg/dL (ref 0.3–1.2)
Total Protein: 6.3 g/dL — ABNORMAL LOW (ref 6.5–8.1)

## 2019-01-08 LAB — GLUCOSE, CAPILLARY: Glucose-Capillary: 112 mg/dL — ABNORMAL HIGH (ref 70–99)

## 2019-01-08 LAB — MAGNESIUM: Magnesium: 2.2 mg/dL (ref 1.7–2.4)

## 2019-01-08 LAB — CALCIUM, IONIZED: Calcium, Ionized, Serum: 3.5 mg/dL — ABNORMAL LOW (ref 4.5–5.6)

## 2019-01-08 LAB — PHOSPHORUS: Phosphorus: 2.3 mg/dL — ABNORMAL LOW (ref 2.5–4.6)

## 2019-01-08 MED ORDER — POTASSIUM CHLORIDE CRYS ER 20 MEQ PO TBCR
40.0000 meq | EXTENDED_RELEASE_TABLET | Freq: Once | ORAL | Status: AC
  Administered 2019-01-08: 10:00:00 40 meq via ORAL
  Filled 2019-01-08: qty 2

## 2019-01-08 MED ORDER — POTASSIUM CHLORIDE CRYS ER 20 MEQ PO TBCR: 40.0000 meq | EXTENDED_RELEASE_TABLET | Freq: Two times a day (BID) | ORAL | Status: DC

## 2019-01-08 MED ORDER — FUROSEMIDE 10 MG/ML IJ SOLN
80.0000 mg | Freq: Once | INTRAMUSCULAR | Status: AC
  Administered 2019-01-08: 80 mg via INTRAVENOUS
  Filled 2019-01-08: qty 8

## 2019-01-08 MED ORDER — POTASSIUM & SODIUM PHOSPHATES 280-160-250 MG PO PACK
2.0000 | PACK | ORAL | Status: AC
  Administered 2019-01-08 (×2): 2 via ORAL
  Filled 2019-01-08 (×2): qty 2

## 2019-01-08 MED ORDER — CALCIUM GLUCONATE-NACL 2-0.675 GM/100ML-% IV SOLN
2.0000 g | Freq: Once | INTRAVENOUS | Status: AC
  Administered 2019-01-08: 11:00:00 2000 mg via INTRAVENOUS
  Filled 2019-01-08: qty 100

## 2019-01-08 MED ORDER — ALBUMIN HUMAN 25 % IV SOLN
12.5000 g | Freq: Once | INTRAVENOUS | Status: AC
  Administered 2019-01-08: 23:00:00 12.5 g via INTRAVENOUS
  Filled 2019-01-08: qty 50

## 2019-01-08 MED ORDER — FUROSEMIDE 10 MG/ML IJ SOLN
80.0000 mg | Freq: Once | INTRAMUSCULAR | Status: AC
  Administered 2019-01-08: 23:00:00 80 mg via INTRAVENOUS
  Filled 2019-01-08: qty 8

## 2019-01-08 MED ORDER — ALBUMIN HUMAN 25 % IV SOLN
12.5000 g | Freq: Once | INTRAVENOUS | Status: AC
  Administered 2019-01-08: 16:00:00 12.5 g via INTRAVENOUS
  Filled 2019-01-08: qty 50

## 2019-01-08 MED ORDER — PANTOPRAZOLE SODIUM 40 MG IV SOLR
40.0000 mg | Freq: Two times a day (BID) | INTRAVENOUS | Status: DC
  Administered 2019-01-08 (×2): 40 mg via INTRAVENOUS
  Filled 2019-01-08 (×2): qty 40

## 2019-01-08 NOTE — Progress Notes (Signed)
NAME:  Yolanda Flores, MRN:  [id number], DOB:  January 12, 1986, LOS: 4 ADMISSION DATE:  01/04/2019, CONSULTATION DATE:  12/24 REFERRING MD:  Justin Mend, CHIEF COMPLAINT:  AKI   Brief History   33 year old female with history of hypertension on ACEi  and EtOH abuse admitted on 12/22 with nausea vomiting, and abdominal pain.  Found to have acute pancreatitis, elevated LFTs and acute kidney injury.  Treated with IV hydration and bowel rest.  LFTs and lipase all improved however on 12/24 in spite of supportive care serum creatinine continued to rise.  Critical care asked to evaluate given concern for possible need for dialysis   Past Medical History  Hypertension on lisinopril, tobacco abuse, alcohol consumption, Endorses drinking at least 3 times a week.  Was hospitalized in May 2020 for alcohol withdrawal.  Has history of elevated LFTs in the setting of alcohol.  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  Ultrasound was negative for cholelithiasis he underwent MRCP which showed gallbladder sludge with associated inflammatory changes with pancreatitis and associated ascites there was no biliary dilation or choledocholithiasis GI noted clinical picture not completely c/w alcohol related pancreatitis.  They question the etiology of pancreatitis specifically could this have been drug-induced?  Or possibly prior stone.  Also question the possibility of autoimmune pancreatitis as an additional contributing factor.  Started on bicarbonate infusion for worsening acidosis 12/24: Creatinine continuing to rise had been 3.3 on admission discontinued to increase.  From time of admission she had received 10 L of fluid, serum creatinine now up to 4.19. Lipase down to 622,  AST 185, ALT 90, Tbili 0.9 Nephrology was consulted and due to concern that she may ultimately  require dialysis critical care was asked to evaluate  Consults:  GI consulted 12/23 Surgical services consulted 12/24 Nephrology consulted 12/24 Pulmonary/critical care consulted 12/24  Procedures:    Significant Diagnostic Tests:  12/22 ultrasound of the abdomen: Mild pericholecystic fluid.  No gallbladder wall thickening or gallstones. 12/23 MR abdomen with MRCP without contrast.  Small trace left and right pleural effusions.  Mild hepatic steatosis but no morphological findings consistent with cirrhosis.  Mild gallbladder sludge, no cholelithiasis, gallbladder wall thickening or inflammatory changes.  Mild pericholecystic fluid/edema moderate peripancreatic fluid/ascites , No walled off necrosis or pseudocyst. 12/23 MR 3D recon scanner acute pancreatitis with moderate abdominal ascites no necrosis or pseudocyst mild gallbladder sludge without associated inflammatory changes 12/23: Renal ultrasound echogenic kidneys bilaterally no obstruction  Micro Data:  COVID-19 12/22: Negative MRSA screen: Negative  Antimicrobials:     Interim history/subjective:   better swallowing but still some chest/abd pain even with liquids   Objective   Blood pressure (!) 166/119, pulse (!) 121, temperature 99.7 F (37.6 C), temperature source Oral, resp. rate 18, height 5\' 7"  (1.702 m), weight 80 kg, last menstrual period 12/14/2018, SpO2 100 %.        Intake/Output Summary (Last 24 hours) at 01/08/2019 1024 Last data filed at 01/08/2019 1012 Gross per 24 hour  Intake 1514.7 ml  Output 213 ml  Net 1301.7 ml   Filed Weights   01/04/19 0939 01/05/19 1800 01/07/19 0500  Weight: 65.8 kg 76.3 kg 80 kg    Examination: More chronically than acutely ill appearing this am   Pt alert, approp nad @ 30 degrees hob No jvd Oropharynx clear,  mucosa nl Neck supple Lungs with a min  rhonchi bilaterally RRR no s3 or or sign murmur Abd soft with minimal upper tenderness s rebound  Extr warm with   Trace pitting edema  Neuro  Sensorium nl ,  no apparent motor deficits   Resolved Hospital Problem list   Nausea and vomiting  Assessment & Plan:  Acute pancreatitis-improving clinically and on labs -continue p.o. intake as tolerated -continue GI cocktail with lidocaine -no indication for antibiotics at this point   AKI with oliguria-no urgent indications for dialysis Lab Results  Component Value Date   CREATININE 9.62 (H) 01/08/2019   CREATININE 8.87 (H) 01/07/2019   CREATININE 7.92 (H) 01/07/2019   - Rx per renal ? Needs to go to [facility] for [facility] as no indication for immediate CVVH here    History of alcohol abuse. No current signs of withdrawal. 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  Iron studies suggest due to acute versus chronic illness   Lab Results  Component Value Date   HGB 10.1 (L) 01/08/2019   HGB 9.8 (L) 01/07/2019   HGB 10.1 (L) 01/06/2019    -continue supplemental vitamins -Encouraged oral intake   Best practice:  Diet: Clears Pain/Anxiety/Delirium protocol (if indicated): Not applicable VAP protocol (if indicated): Not applicable DVT prophylaxis: Heparin subcutaneously every 8 hours GI prophylaxis: PPI and Carafate Mobility: Bedrest Code Status: Full code Family Communication: Per primary Disposition:  ? To [facility] for HD?    Labs   CBC: Recent Labs  Lab 01/04/19 0943 01/04/19 2208 01/05/19 0414 01/06/19 0854 01/07/19 0943 01/08/19 0832  WBC 16.4* 8.3 7.4 9.2 9.7 10.3  NEUTROABS 15.6*  --  6.5 8.0* 7.8* PENDING  HGB 14.6 12.3 12.4 10.1* 9.8* 10.1*  HCT 42.0 35.1* 37.6 28.9* 28.6* 28.5*  MCV 95.2 96.2 99.2 95.4 95.7 92.8  PLT 333 180 176 146* 199 [id number]    Basic Metabolic Panel: Recent Labs  Lab  01/05/19 0414 01/06/19 0854 01/07/19 0209 01/07/19 0943 01/07/19 1745 01/08/19 0832  NA 133* 134* 134*  --  134* 133*  K 3.9 3.2* 3.3*  --  3.4* 3.0*  CL 105 97* 97*  --  96* 94*  CO2 13* 22 21*  --  20* 20*  GLUCOSE 106* 165* 108*  --  112* 111*  BUN 43* 58* 65*  --  70* 77*  CREATININE 4.19* 6.75* 7.92*  --  8.87* 9.62*  CALCIUM 5.9* 5.4* 6.1*  --  6.3* 6.3*  MG 1.3*  --   --  1.8 2.1 2.2  PHOS 1.0*  --   --  <1.0* 2.8 2.3*   GFR: Estimated Creatinine Clearance: 9.2 mL/min (A) (by C-G formula based on SCr of 9.62 mg/dL (H)). Recent Labs  Lab 01/04/19 0943 01/05/19 0414 01/06/19 0854 01/07/19 0943 01/08/19 0832  WBC 16.4* 7.4 9.2 9.7 10.3  LATICACIDVEN 2.4*  --   --   --   --     Liver Function Tests: Recent Labs  Lab 01/04/19 1041 01/05/19 0414 01/06/19 0854 01/07/19 0209 01/08/19 0832  AST 1,669* 538* 185* 115* 50*  ALT 285* 160* 90* 71* 50*  ALKPHOS 74 85 96 98 102  BILITOT 2.6* 1.5* 0.9 0.9 0.8  PROT 7.1 6.1* 5.6* 5.8* 6.3*  ALBUMIN 3.8 3.0* 2.6* 2.6* 2.6*  Recent Labs  Lab 01/04/19 1041 01/05/19 0414 01/06/19 0854 01/07/19 0943  LIPASE 4,101* 1,642* 622* 196*   No results for input(s): AMMONIA in the last 168 hours.  ABG    Component Value Date/Time   TCO2 27 11/25/2007 0635     Coagulation Profile: No results for input(s): INR, PROTIME in the last 168 hours.  Cardiac Enzymes: No results for input(s): CKTOTAL, CKMB, CKMBINDEX, TROPONINI in the last 168 hours.  HbA1C: Hgb A1c MFr Bld  Date/Time Value Ref Range Status  01/05/2019 04:14 AM 4.9 4.8 - 5.6 % Final    Comment:    (NOTE) Pre diabetes:          5.7%-6.4% Diabetes:              >6.4% Glycemic control for   <7.0% adults with diabetes   05/25/2018 04:03 AM 5.0 4.8 - 5.6 % Final    Comment:    (NOTE) Pre diabetes:          5.7%-6.4% Diabetes:              >6.4% Glycemic control for   <7.0% adults with diabetes     CBG: Recent Labs  Lab 01/05/19 1209 01/06/19 0745  01/07/19 0727 01/08/19 0816  GLUCAP 107* 155* 113* 112*    Christinia Gully, MD Pulmonary and [address] [place] [phone] After 5:30 PM or weekends, use [facility] [phone]

## 2019-01-08 NOTE — Progress Notes (Signed)
PROGRESS NOTE    BRYANNA WASSENBERG  [id number] DOB: [birth year] DOA: 01/04/2019 PCP: Patient, No Pcp Per   Brief Narrative:  ALEZANDRA ORTMANN is a 33 y.o. female with medical history for hypertension, GERD, alcohol abuse who presents with chief complaint of nausea vomiting abdominal pain that started Sunday.  Patient states that she was drinking on Sunday had a couple shots and subsequently she developed insidious abdominal pain she states the pain has been constant since Sunday and has been laying in bed due to the pain and is not able to sleep.  She states it hurts in the left upper quadrant and mid abdomen but does not radiate to the back.  Never had this type of pain before and states that she has been drinking shots previously.  Has been taking Protonix for epigastric pain and she also endorses chest discomfort while swallowing.  Because of these issues she came to the emergency room for further evaluation and she was found to have acute pancreatitis.  TRH was asked to admit this patient for acute pancreatitis and gastroenterology was consulted for further evaluation recommendations  ED Course: In the ED she had basic blood work and was given pain control and given 2 L of saline boluses and placed on maintenance IV fluid.  Gastroenterology was consulted for further evaluation recommendations.  Of note SARS-CoV-2 testing was negative  **Interim History Her LFTs have improved however her renal function worsened so nephrology was consulted.  GI is on board and asked general surgery to further evaluate as well and currently there are no acute surgical issues.  Her renal function is still worsening and nephrology is following and they have adequately resuscitated the patient with her fluids and I have now started the IV Lasix 80 mg every 12 and her holding IV fluids but unfortunately she still remains oliguric without evidence of any renal recovery and currently she has no acute indications for  dialysis and transfer to [facility] is not yet required but Dr. Posey Pronto can to try albumin and Lasix combination again to see if he can augment urine output.  Electrolytes have been repleted.  Because of concern of possible dialysis critical care was consulted for line placement but she would end up going to [facility] for hemodialysis as there is no indication for immediate CVVH here  Assessment & Plan:   Active Problems:   HTN (hypertension)   Lactic acidosis   Alcohol abuse   Abnormal LFTs   Acute gallstone pancreatitis   Pancreatitis  SIRS from Acute Pancreatitis and now also has an acute peripancreatic fluid collection -On Admission Presented with tachypnea, tachycardia, leukocytosis and a pancreatitis -Has been having sporadic episodes of low blood pressure which may be contributing to her renal dysfunction and worsening kidney -Unclear Etiology of her pancreatitis but could be biliary versus alcohol induced as she drinks at least 3 times a week; also could be from shock liver versus a common bile duct stone however do not feel that there is a common bile duct stone in general surgery has been consulted and feels no surgical intervention is necessary -Patient's lipase level is over 4000 LFTs were also abnormal but have now improved; lipase level is now 196 and AST and ALT have improved as well with an AST of 115 and ALT of 71 down significantly from admission -N.p.o. and bowel rest was initiated but she was started on a clear liquid diet per pulmonary critical care and now on a full liquid diet  and gradually advancing full liquid diet as tolerated -Pulmonary critical care was consulted for further evaluation recommendations given the possible need for dialysis -Continued IV fluid hydration however this has been now stopped and held from nephrology and they are starting diuresis with IV Lasix 80 mg every 12 -IgG4 for autoimmune pancreatitis antibodies is still pending -She underwent MRCP on  01/05/2019 which showed gallbladder sludge associate inflammatory changes and pancreatitis severe ascites.  There is acute pancreatitis with moderate abdominal ascites with no walled off pseudocyst or necrosis.  There is no evidence of very dilatation or choledocholithiasis; however there is no IV contrast on MRCP so necrosis cannot be assessed -HIDA scan was also ordered -Continue to monitor trend LFTs and lipase level -Continue with pain control -Avoid hepatotoxic and nephrotoxic medications -General surgery was consulted given her gallbladder sludge and they do not think intervention is indicated this time and appreciate further evaluation recommendations -Continue for the stepdown monitoring for now -She is spiking temperatures and is febrile and tachypneic and tachycardic today but feels that her abdominal pain is improving; now complaining some diarrhea -Critical care is now trying GI cocktail with lidocaine -Currently no indication for antibiotics at this time  Hyponatremia/Hypocholremia -Patient sodium is 131 and chloride level of 92 at the time of admission was slowly improving; sodium is now 133 and chloride is now 94 -IV fluid hydration as above has now been discontinued by nephrology and she is being started on IV diuresis  Lactic Acidosis -Patient's lactic acid level on admission was 2.4 -IV fluid hydration as above now stopped as above by nephrology -Continued to monitor and trend  AKI, worsening likely ATN Elevated anion gap metabolic acidosis -in the setting of nausea vomiting and dehydration from pancreatitis -Avoid nephrotoxic medications and will hold her lisinopril -Patient is BUN/creatinine went from 36/3.30 -> 77/9.62 -Patient's CO2 on admission was 19, chloride was 92, and anion gap was 20; now patient CO2 is 20, chloride level is 94, as well as anion gap being 19 -IV fluid hydration has now been discontinued and nephrology starting IV Lasix 80 mg twice daily and  are now augmenting this with albumin 12.5 g IV -Avoid nephrotoxic medications, contrast dyes, hypotension and renally adjust medications -Continue to monitor and trend renal function next-repeat CMP in a.m.  Hyperglycemia -Patient blood sugar on admission was 139; repeat blood sugar this morning on CMP was 111 -Checked hemoglobin A1c and was 4.9 -If necessary will place on sensitive NovoLog sliding scale insulin  Leukocytosis, improved -Patient's WBC was 16.4 and is now 10.3 -In the setting of pancreatitis -Continue to monitor for signs and symptoms of infection -Repeat CBC in a.m.  Abnormal LFTs/alcoholic hepatitis, improving -Patient's AST was 1669 and ALT was 285; now AST is 50 and ALT is now 15 -Checked a right upper quadrant ultrasound as well as an acute hepatitis panel which was negative -Ultrasound was done as well as an MRCP see above -Continue to monitor and trend LFTs and may need further work-up and imaging and will defer to GI for this next -Repeat CMP in a.m.  Essential Hypertension -Was elevated on admission at 169/123 -We will place on IV hydralazine as we are holding her lisinopril -Continue metoprolol  Alcohol abuse -Patient drinks at least 3 times a week -We will place on CIWA protocol and continue with folic acid, multivitamin and thiamine -Currently not in withdrawal  GERD -PPI with Pantoprazole 40 mg po Daily changed ot IV Pantoprazole 40 mg q12h -C/w Maalox/Mylanta  and Lidocaine Viscous  -C/w Clear Liquid Diet   HypoKalemia  -Patient's Potassium was 3.4 yesterday Afternoon and repeat this AM is was 3.0 -Repelte with p.o. KCl 40 mEq once as well as Phos-NAK 2 packet q4h -Continue to Monitor and Replete as Necessary -Repeat CMP in AM   Hypomagnesemia -Improved -Mag Level was 1.8 yesterday AM but still replete with IV Mag Sulfate 1 gram and improved to 2.1; Repeat this AM i is 2.2 -Continue to Monitor and Replete as Necessary -Repeat Mag Level  in the AM   Hypophosphatemia, improved  -Patient's Phos Level was severely low at <1.0 and repeat yesterday afternoon was 2.8; Repeat this AM was 2.3 -Replete with Phos-NAK 2 packets po q4h x 2 doses  -Continue to Monitor and Replete as Necessary -Repeat Phos Level in AM  Hypocalcemia -Ca2+ was 6.3 -Repelte with IV Ca2+ Gluconate 2 grams again -Continue to Monitor and Replete as Necessary -Repeat CMP in AM   Normocytic Anemia -Likely dilutional drop or in the setting of renal disease, hepatic disease and bone marrow toxicity from alcohol and poor nutritional reserves -Patient's hemoglobin/hematocrit went from 12.4/37.6 on admission is now 10.1/28.5 -Checked Anemia Panel this AM and it showed an iron level of 27, U IBC of 206, TIBC of 233, saturation ratios of 12%, ferritin level of 593, folate level of 14.3 -Continue to monitor for signs and symptoms of bleeding; currently no overt bleeding noted -Repeat CBC in a.m.  DVT prophylaxis: Heparin 5000 units subcu every 8 Code Status: FULL CODE  Family Communication: No family present at bedside  Disposition Plan: Remain in SDU for now  Consultants:   Gastroenterology  General Surgery  Nephrology  PCCM/Pulmonary   Procedures:  None  Antimicrobials:  Anti-infectives (From admission, onward)   None     Subjective: Seen and examined at bedside and she has no abdominal pain but states her her indigestion was bothering her today.  No lightheadedness or dizziness.  States her abdominal pain has improved significantly.  I discussed with her about her renal function worsening.  She is still making some urine but is decreased.  She denies any other complaints or concerns at this time.  Objective: Vitals:   01/08/19 0100 01/08/19 0317 01/08/19 0400 01/08/19 0636  BP: (!) 160/106  (!) 161/113 (!) 173/119  Pulse: (!) 110  (!) 115   Resp: (!) 27  18   Temp:  99.6 F (37.6 C)    TempSrc:  Oral    SpO2: 99%  99%   Weight:       Height:        Intake/Output Summary (Last 24 hours) at 01/08/2019 0757 Last data filed at 01/08/2019 0600 Gross per 24 hour  Intake 2053.6 ml  Output 208 ml  Net 1845.6 ml   Filed Weights   01/04/19 0939 01/05/19 1800 01/07/19 0500  Weight: 65.8 kg 76.3 kg 80 kg   Examination: Physical Exam:  Constitutional: WN/WD overweight female and slight distress appears mildly anxious and does appear a little bit uncomfortable Eyes: Lids and conjunctivae normal, sclerae anicteric  ENMT: External Ears, Nose appear normal. Grossly normal hearing.  Has a poor dentition Neck: Appears normal, supple, no cervical masses, normal ROM, no appreciable thyromegaly Respiratory: Diminished to auscultation bilaterally, no wheezing, rales, rhonchi or crackles.  She is slightly tachypneic. No accessory muscle use.  Cardiovascular: Tachycardic rate but regular rhythm, no murmurs / rubs / gallops. S1 and S2 auscultated.  Trace extremity edema.  Abdomen: Soft,  non-tender, Distended. Bowel sounds positive x4.  GU: Deferred. Musculoskeletal: No clubbing / cyanosis of digits/nails. No joint deformity upper and lower extremities.  Skin: No rashes, lesions, ulcers on limited skin evaluation. No induration; Warm and dry.  Neurologic: CN 2-12 grossly intact with no focal deficits.  Romberg sign and cerebellar reflexes not assessed.  Psychiatric: Normal judgment and insight. Alert and oriented x 3.  Slightly anxious today mood and appropriate affect.   Data Reviewed: I have personally reviewed following labs and imaging studies  CBC: Recent Labs  Lab 01/04/19 0943 01/04/19 2208 01/05/19 0414 01/06/19 0854 01/07/19 0943  WBC 16.4* 8.3 7.4 9.2 9.7  NEUTROABS 15.6*  --  6.5 8.0* 7.8*  HGB 14.6 12.3 12.4 10.1* 9.8*  HCT 42.0 35.1* 37.6 28.9* 28.6*  MCV 95.2 96.2 99.2 95.4 95.7  PLT 333 180 176 146* [id number]   Basic Metabolic Panel: Recent Labs  Lab 01/04/19 1041 01/05/19 0414 01/06/19 0854 01/07/19 0209  01/07/19 0943 01/07/19 1745  NA 131* 133* 134* 134*  --  134*  K 4.4 3.9 3.2* 3.3*  --  3.4*  CL 92* 105 97* 97*  --  96*  CO2 19* 13* 22 21*  --  20*  GLUCOSE 139* 106* 165* 108*  --  112*  BUN 36* 43* 58* 65*  --  70*  CREATININE 3.30* 4.19* 6.75* 7.92*  --  8.87*  CALCIUM 7.1* 5.9* 5.4* 6.1*  --  6.3*  MG  --  1.3*  --   --  1.8 2.1  PHOS  --  1.0*  --   --  <1.0* 2.8   GFR: Estimated Creatinine Clearance: 10 mL/min (A) (by C-G formula based on SCr of 8.87 mg/dL (H)). Liver Function Tests: Recent Labs  Lab 01/04/19 1041 01/05/19 0414 01/06/19 0854 01/07/19 0209  AST 1,669* 538* 185* 115*  ALT 285* 160* 90* 71*  ALKPHOS 74 85 96 98  BILITOT 2.6* 1.5* 0.9 0.9  PROT 7.1 6.1* 5.6* 5.8*  ALBUMIN 3.8 3.0* 2.6* 2.6*   Recent Labs  Lab 01/04/19 1041 01/05/19 0414 01/06/19 0854 01/07/19 0943  LIPASE 4,101* 1,642* 622* 196*   No results for input(s): AMMONIA in the last 168 hours. Coagulation Profile: No results for input(s): INR, PROTIME in the last 168 hours. Cardiac Enzymes: No results for input(s): CKTOTAL, CKMB, CKMBINDEX, TROPONINI in the last 168 hours. BNP (last 3 results) No results for input(s): PROBNP in the last 8760 hours. HbA1C: No results for input(s): HGBA1C in the last 72 hours. CBG: Recent Labs  Lab 01/05/19 1209 01/06/19 0745 01/07/19 0727  GLUCAP 107* 155* 113*   Lipid Profile: No results for input(s): CHOL, HDL, LDLCALC, TRIG, CHOLHDL, LDLDIRECT in the last 72 hours. Thyroid Function Tests: No results for input(s): TSH, T4TOTAL, FREET4, T3FREE, THYROIDAB in the last 72 hours. Anemia Panel: Recent Labs    01/07/19 0943  VITAMINB12 1,148*  FOLATE 14.3  FERRITIN 593*  TIBC 233*  IRON 27*  RETICCTPCT 0.7   Sepsis Labs: Recent Labs  Lab 01/04/19 0943  LATICACIDVEN 2.4*    Recent Results (from the past 240 hour(s))  Respiratory Panel by RT PCR (Flu A&B, Covid) - Nasopharyngeal Swab     Status: None   Collection Time: 01/04/19   1:08 PM   Specimen: Nasopharyngeal Swab  Result Value Ref Range Status   SARS Coronavirus 2 by RT PCR NEGATIVE NEGATIVE Final    Comment: (NOTE) SARS-CoV-2 target nucleic acids are NOT DETECTED. The SARS-CoV-2 RNA is  generally detectable in upper respiratoy specimens during the acute phase of infection. The lowest concentration of SARS-CoV-2 viral copies this assay can detect is 131 copies/mL. A negative result does not preclude SARS-Cov-2 infection and should not be used as the sole basis for treatment or other patient management decisions. A negative result may occur with  improper specimen collection/handling, submission of specimen other than nasopharyngeal swab, presence of viral mutation(s) within the areas targeted by this assay, and inadequate number of viral copies (<131 copies/mL). A negative result must be combined with clinical observations, patient history, and epidemiological information. The expected result is Negative. Fact Sheet for Patients:  PinkCheek.be Fact Sheet for Healthcare Providers:  GravelBags.it This test is not yet ap proved or cleared by the [place] FDA and  has been authorized for detection and/or diagnosis of SARS-CoV-2 by FDA under an Emergency Use Authorization (EUA). This EUA will remain  in effect (meaning this test can be used) for the duration of the COVID-19 declaration under Section 564(b)(1) of the Act, 21 U.S.C. section 360bbb-3(b)(1), unless the authorization is terminated or revoked sooner.    Influenza A by PCR NEGATIVE NEGATIVE Final   Influenza B by PCR NEGATIVE NEGATIVE Final    Comment: (NOTE) The Xpert Xpress SARS-CoV-2/FLU/RSV assay is intended as an aid in  the diagnosis of influenza from Nasopharyngeal swab specimens and  should not be used as a sole basis for treatment. Nasal washings and  aspirates are unacceptable for Xpert Xpress SARS-CoV-2/FLU/RSV  testing.  Fact Sheet for Patients: PinkCheek.be Fact Sheet for Healthcare Providers: GravelBags.it This test is not yet approved or cleared by the [place] FDA and  has been authorized for detection and/or diagnosis of SARS-CoV-2 by  FDA under an Emergency Use Authorization (EUA). This EUA will remain  in effect (meaning this test can be used) for the duration of the  Covid-19 declaration under Section 564(b)(1) of the Act, 21  U.S.C. section 360bbb-3(b)(1), unless the authorization is  terminated or revoked. Performed at [facility], [place] [address] [place], [place] [address]   MRSA PCR Screening     Status: None   Collection Time: 01/06/19  3:40 AM   Specimen: Nasal Mucosa; Nasopharyngeal  Result Value Ref Range Status   MRSA by PCR NEGATIVE NEGATIVE Final    Comment:        The GeneXpert MRSA Assay (FDA approved for NASAL specimens only), is one component of a comprehensive MRSA colonization surveillance program. It is not intended to diagnose MRSA infection nor to guide or monitor treatment for MRSA infections. Performed at [facility], [place] [address], [place], [place] [address]     Radiology Studies: DG Chest Port 1 View  Result Date: 01/06/2019 CLINICAL DATA:  Shortness of breath today, dyspnea EXAM: PORTABLE CHEST 1 VIEW COMPARISON:  Portable exam 1408 hours compared to 05/24/2018 FINDINGS: Normal heart size and mediastinal contours. Dense consolidation of LEFT lower lobe question atelectasis versus infiltrate. Minimal atelectasis or infiltrate at medial RIGHT lung base. Decreased lung volumes. Cannot exclude small LEFT pleural effusion. No pneumothorax or acute osseous findings. IMPRESSION: Atelectasis versus consolidation LEFT lower lobe and minimally at medial RIGHT lung base. Question small LEFT pleural effusion. Electronically Signed   By: Lavonia Dana M.D.   On: 01/06/2019  14:17   Scheduled Meds: . Chlorhexidine Gluconate Cloth  6 each Topical Daily  . folic acid  1 mg Oral Daily  . furosemide  80 mg Intravenous Q12H  .  heparin  5,000 Units Subcutaneous Q8H  . LORazepam  0-4 mg Intravenous Q12H  . multivitamin with minerals  1 tablet Oral Daily  . pantoprazole  40 mg Oral Q1200  . thiamine  100 mg Oral Daily   Or  . thiamine  100 mg Intravenous Daily   Continuous Infusions:   LOS: 4 days   Kerney Elbe, DO [facility] PAGER is on AMION  If 7PM-7AM, please contact night-coverage [email or web address]

## 2019-01-08 NOTE — Progress Notes (Addendum)
Daily Rounding Note  01/08/2019, 9:03 AM  LOS: 4 days   SUBJECTIVE:   Chief complaint:     Abdominal pain better.  Continues to have burning chest pain, sometimes p.o. intake worsens this.  Sometimes makes it difficult to take a deep breath and she subsequently feels short of breath.  Mild if any nausea.  Stools loose, brown. I/Os: + 1.8 L.  Urine output 208 mL.    OBJECTIVE:         Vital signs in last 24 hours:    Temp:  [99.2 F (37.3 C)-100.6 F (38.1 C)] 99.6 F (37.6 C) (12/26 0317) Pulse Rate:  [106-120] 115 (12/26 0400) Resp:  [16-32] 18 (12/26 0400) BP: (137-176)/(94-127) 173/119 (12/26 0636) SpO2:  [95 %-99 %] 99 % (12/26 0400) Last BM Date: 01/07/19 Filed Weights   01/04/19 0939 01/05/19 1800 01/07/19 0500  Weight: 65.8 kg 76.3 kg 80 kg   General: Patient looks well.  Slightly uncomfortable.  Not toxic. Oral: MM moist, clear.  No lesions, exudates, signs of Candida. Heart: Tachy in the 130s Chest: Clear bilaterally.  No labored breathing. Abdomen: Soft.  Mild to moderate tenderness in the upper abdomen is nonfocal. Extremities: Slight, nonpitting edema at the top of her feet bilaterally. Neuro/Psych: Alert.  Fully oriented.  No gross deficits, tremors.  Intake/Output from previous day: 12/25 0701 - 12/26 0700 In: 2053.6 [P.O.:1225; IV Piggyback:708.6] Out: 208 [Urine:208]  Intake/Output this shift: No intake/output data recorded.  Lab Results: Recent Labs    01/06/19 0854 01/07/19 0943  WBC 9.2 9.7  HGB 10.1* 9.8*  HCT 28.9* 28.6*  PLT 146* 199   BMET Recent Labs    01/06/19 0854 01/07/19 0209 01/07/19 1745  NA 134* 134* 134*  K 3.2* 3.3* 3.4*  CL 97* 97* 96*  CO2 22 21* 20*  GLUCOSE 165* 108* 112*  BUN 58* 65* 70*  CREATININE 6.75* 7.92* 8.87*  CALCIUM 5.4* 6.1* 6.3*   LFT Recent Labs    01/06/19 0854 01/07/19 0209  PROT 5.6* 5.8*  ALBUMIN 2.6* 2.6*  AST 185* 115*  ALT  90* 71*  ALKPHOS 96 98  BILITOT 0.9 0.9   PT/INR No results for input(s): LABPROT, INR in the last 72 hours. Hepatitis Panel No results for input(s): HEPBSAG, HCVAB, HEPAIGM, HEPBIGM in the last 72 hours.  Studies/Results: DG Chest Port 1 View  Result Date: 01/06/2019 CLINICAL DATA:  Shortness of breath today, dyspnea EXAM: PORTABLE CHEST 1 VIEW COMPARISON:  Portable exam 1408 hours compared to 05/24/2018 FINDINGS: Normal heart size and mediastinal contours. Dense consolidation of LEFT lower lobe question atelectasis versus infiltrate. Minimal atelectasis or infiltrate at medial RIGHT lung base. Decreased lung volumes. Cannot exclude small LEFT pleural effusion. No pneumothorax or acute osseous findings. IMPRESSION: Atelectasis versus consolidation LEFT lower lobe and minimally at medial RIGHT lung base. Question small LEFT pleural effusion. Electronically Signed   By: Lavonia Dana M.D.   On: 01/06/2019 14:17    Scheduled Meds: . Chlorhexidine Gluconate Cloth  6 each Topical Daily  . folic acid  1 mg Oral Daily  . furosemide  80 mg Intravenous Q12H  . heparin  5,000 Units Subcutaneous Q8H  . LORazepam  0-4 mg Intravenous Q12H  . multivitamin with minerals  1 tablet Oral Daily  . pantoprazole  40 mg Oral Q1200  . thiamine  100 mg Oral Daily   Or  . thiamine  100 mg Intravenous Daily  Continuous Infusions: PRN Meds:.acetaminophen, alum & mag hydroxide-simeth **AND** lidocaine, bisacodyl, hydrALAZINE, labetalol, morphine injection, ondansetron **OR** ondansetron (ZOFRAN) IV, oxyCODONE, phenol, polyethylene glycol   ASSESMENT:   *    Acute pancreatitis.  Due to EtOH and/or biliary causes. Abdominal pain improved.  Lipase and LFTs improved. Managing including ultrasound, MRCP show mild pericholecystic fluid, fatty liver, mild biliary sludge, no ductal dilatation or intraductal stones.  *    Hypokalemia.  Hyponatremia.  *     AKI with oliguria.  Receiving Lasix 80 mg IV every 12  hours. Not on IV fluids  *      Normocytic anemia.  Low iron and TIBC.  Iron sats, ferritin, folate, B12 levels okay.   PLAN   *     Continue Protonix 40 mg p.o./day.  Patient prefers to stay on clear liquid diet, afraid if she eats something more solid that it is going to make her feel worse so will leave clear diet in place.  *   Supportive care.  Given her worsening renal function, wonder if Lasix should be continued and whether she should be getting IV fluids?   Dr. Justin Mend of [facility] is following her and will defer management of diuretics, fluids to him as he ordered the Lasix and held lactated Ringer's per his note yesterday.  Azucena Freed  01/08/2019, 9:03 AM Phone [phone]     Attending physician's note   I have taken an interval history, reviewed the chart and examined the patient. I agree with the Advanced Practitioner's note, impression and recommendations.    Acute severe pancreatitis d/t ETOH with SIRSand acuteperipancreatic fluid collection. Necrosis could not be assessed d/tlack of IV contraston MRCP. Nocholelithiasis/CBD dilatation.  ARF Electrolyte imbalance Burning chest pain despite Protonix and GI cocktail  Plan: -She prefers to stay on clear liquid diet.  Advance to full liquid by tomorrow morning.  Hopefully, we can avoid cortrak/Dobbhoff feeding tube for nutritional support. -Ambulate -Continue supportive care for now. -Monitor and correct electrolytes. -No GI intervention planned.  Carmell Austria, MD [facility] Fab[facility] Bruns [phone].

## 2019-01-08 NOTE — Progress Notes (Addendum)
Patient ID: JOZEE CROLL, female   DOB: [birth year], 33 y.o.   MRN: [id number] [facility] Progress Note   Assessment/ Plan:   1. Acute kidney Injury: Appears to be consistent with ATN associated with SIRS from acute pancreatitis.  Unfortunately, she remains oliguric and without any evidence of renal recovery.  She does not have any acute indications for dialysis from the labs/clinical standpoint and transfer to [place] not yet required.  I will try albumin/Lasix today to see if I can augment urine output. 2.  Acute pancreatitis with SIRS: Suspected to be from alcohol/biliary etiology.  Improving symptoms and lipase level. 3.  Hyponatremia: Secondary to acute kidney injury and impaired free water excretion.  We will continue to follow challenge with diuretics today. 4.  Hypokalemia: Secondary to limited intake/diuretic challenge and ongoing cautious supplementation. 5.  Anion gap metabolic acidosis: Secondary to acute kidney injury/starvation ketosis, monitor with labs without ongoing bicarbonate supplementation. 6.  Hypophosphatemia: Secondary to impaired intake, monitor with supplementation.  Subjective:   Complains of dyspepsia/increased reflux-like symptoms but improved abdominal pain.  Denies any hallucinations or abnormal limb jerking movements.   Objective:   BP (!) 166/119   Pulse (!) 121   Temp 99.7 F (37.6 C) (Oral)   Resp 18   Ht 5\' 7"  (1.702 m)   Wt 80 kg   LMP 12/14/2018 (Approximate)   SpO2 100%   BMI 27.62 kg/m   Intake/Output Summary (Last 24 hours) at 01/08/2019 1032 Last data filed at 01/08/2019 1012 Gross per 24 hour  Intake 1514.7 ml  Output 213 ml  Net 1301.7 ml   Weight change:   Physical Exam: Gen: Appears comfortable resting in bed, watching television.  RN at bedside CVS: Pulse regular tachycardia, S1 and S2 normal Resp: Diminished breath sounds over bases, no distinct rales or rhonchi Abd: Soft, tenderness over epigastric  area Ext: Trace lower extremity edema, no asterixis.  Imaging: DG Chest Port 1 View  Result Date: 01/06/2019 CLINICAL DATA:  Shortness of breath today, dyspnea EXAM: PORTABLE CHEST 1 VIEW COMPARISON:  Portable exam 1408 hours compared to 05/24/2018 FINDINGS: Normal heart size and mediastinal contours. Dense consolidation of LEFT lower lobe question atelectasis versus infiltrate. Minimal atelectasis or infiltrate at medial RIGHT lung base. Decreased lung volumes. Cannot exclude small LEFT pleural effusion. No pneumothorax or acute osseous findings. IMPRESSION: Atelectasis versus consolidation LEFT lower lobe and minimally at medial RIGHT lung base. Question small LEFT pleural effusion. Electronically Signed   By: Lavonia Dana M.D.   On: 01/06/2019 14:17   Labs: BMET Recent Labs  Lab 01/04/19 1041 01/05/19 0414 01/06/19 0854 01/07/19 0209 01/07/19 0943 01/07/19 1745 01/08/19 0832  NA 131* 133* 134* 134*  --  134* 133*  K 4.4 3.9 3.2* 3.3*  --  3.4* 3.0*  CL 92* 105 97* 97*  --  96* 94*  CO2 19* 13* 22 21*  --  20* 20*  GLUCOSE 139* 106* 165* 108*  --  112* 111*  BUN 36* 43* 58* 65*  --  70* 77*  CREATININE 3.30* 4.19* 6.75* 7.92*  --  8.87* 9.62*  CALCIUM 7.1* 5.9* 5.4* 6.1*  --  6.3* 6.3*  PHOS  --  1.0*  --   --  <1.0* 2.8 2.3*   CBC Recent Labs  Lab 01/05/19 0414 01/06/19 0854 01/07/19 0943 01/08/19 0832  WBC 7.4 9.2 9.7 10.3  NEUTROABS 6.5 8.0* 7.8* PENDING  HGB 12.4 10.1* 9.8* 10.1*  HCT 37.6  28.9* 28.6* 28.5*  MCV 99.2 95.4 95.7 92.8  PLT 176 146* 199 281   Medications:    . Chlorhexidine Gluconate Cloth  6 each Topical Daily  . folic acid  1 mg Oral Daily  . furosemide  80 mg Intravenous Q12H  . heparin  5,000 Units Subcutaneous Q8H  . LORazepam  0-4 mg Intravenous Q12H  . multivitamin with minerals  1 tablet Oral Daily  . pantoprazole  40 mg Oral Q1200  . potassium & sodium phosphates  2 packet Oral Q4H  . thiamine  100 mg Oral Daily   Or  . thiamine  100  mg Intravenous Daily   Elmarie Shiley, MD 01/08/2019, 10:32 AM

## 2019-01-09 ENCOUNTER — Inpatient Hospital Stay (HOSPITAL_COMMUNITY): Payer: BLUE CROSS/BLUE SHIELD

## 2019-01-09 LAB — CBC WITH DIFFERENTIAL/PLATELET
Abs Immature Granulocytes: 0.57 10*3/uL — ABNORMAL HIGH (ref 0.00–0.07)
Basophils Absolute: 0.1 10*3/uL (ref 0.0–0.1)
Basophils Relative: 1 %
Eosinophils Absolute: 0.2 10*3/uL (ref 0.0–0.5)
Eosinophils Relative: 2 %
HCT: 25.8 % — ABNORMAL LOW (ref 36.0–46.0)
Hemoglobin: 9 g/dL — ABNORMAL LOW (ref 12.0–15.0)
Immature Granulocytes: 5 %
Lymphocytes Relative: 6 %
Lymphs Abs: 0.7 10*3/uL (ref 0.7–4.0)
MCH: 32.3 pg (ref 26.0–34.0)
MCHC: 34.9 g/dL (ref 30.0–36.0)
MCV: 92.5 fL (ref 80.0–100.0)
Monocytes Absolute: 2.9 10*3/uL — ABNORMAL HIGH (ref 0.1–1.0)
Monocytes Relative: 27 %
Neutro Abs: 6.4 10*3/uL (ref 1.7–7.7)
Neutrophils Relative %: 59 %
Platelets: 431 10*3/uL — ABNORMAL HIGH (ref 150–400)
RBC: 2.79 MIL/uL — ABNORMAL LOW (ref 3.87–5.11)
RDW: 14.3 % (ref 11.5–15.5)
WBC: 10.8 10*3/uL — ABNORMAL HIGH (ref 4.0–10.5)
nRBC: 0.2 % (ref 0.0–0.2)

## 2019-01-09 LAB — COMPREHENSIVE METABOLIC PANEL
ALT: 37 U/L (ref 0–44)
AST: 32 U/L (ref 15–41)
Albumin: 2.6 g/dL — ABNORMAL LOW (ref 3.5–5.0)
Alkaline Phosphatase: 88 U/L (ref 38–126)
Anion gap: 18 — ABNORMAL HIGH (ref 5–15)
BUN: 81 mg/dL — ABNORMAL HIGH (ref 6–20)
CO2: 17 mmol/L — ABNORMAL LOW (ref 22–32)
Calcium: 6.8 mg/dL — ABNORMAL LOW (ref 8.9–10.3)
Chloride: 97 mmol/L — ABNORMAL LOW (ref 98–111)
Creatinine, Ser: 10.36 mg/dL — ABNORMAL HIGH (ref 0.44–1.00)
GFR calc Af Amer: 5 mL/min — ABNORMAL LOW (ref >60)
GFR calc non Af Amer: 4 mL/min — ABNORMAL LOW (ref >60)
Glucose, Bld: 97 mg/dL (ref 70–99)
Potassium: 2.9 mmol/L — ABNORMAL LOW (ref 3.5–5.1)
Sodium: 132 mmol/L — ABNORMAL LOW (ref 135–145)
Total Bilirubin: 0.7 mg/dL (ref 0.3–1.2)
Total Protein: 6 g/dL — ABNORMAL LOW (ref 6.5–8.1)

## 2019-01-09 LAB — RENAL FUNCTION PANEL
Albumin: 2.7 g/dL — ABNORMAL LOW (ref 3.5–5.0)
Anion gap: 18 — ABNORMAL HIGH (ref 5–15)
BUN: 82 mg/dL — ABNORMAL HIGH (ref 6–20)
CO2: 17 mmol/L — ABNORMAL LOW (ref 22–32)
Calcium: 6.8 mg/dL — ABNORMAL LOW (ref 8.9–10.3)
Chloride: 97 mmol/L — ABNORMAL LOW (ref 98–111)
Creatinine, Ser: 10.33 mg/dL — ABNORMAL HIGH (ref 0.44–1.00)
GFR calc Af Amer: 5 mL/min — ABNORMAL LOW (ref >60)
GFR calc non Af Amer: 4 mL/min — ABNORMAL LOW (ref >60)
Glucose, Bld: 96 mg/dL (ref 70–99)
Phosphorus: 3.4 mg/dL (ref 2.5–4.6)
Potassium: 3 mmol/L — ABNORMAL LOW (ref 3.5–5.1)
Sodium: 132 mmol/L — ABNORMAL LOW (ref 135–145)

## 2019-01-09 LAB — MAGNESIUM: Magnesium: 2.1 mg/dL (ref 1.7–2.4)

## 2019-01-09 LAB — PHOSPHORUS: Phosphorus: 3.3 mg/dL (ref 2.5–4.6)

## 2019-01-09 LAB — CALCIUM, IONIZED: Calcium, Ionized, Serum: 3.4 mg/dL — ABNORMAL LOW (ref 4.5–5.6)

## 2019-01-09 LAB — GLUCOSE, CAPILLARY: Glucose-Capillary: 91 mg/dL (ref 70–99)

## 2019-01-09 MED ORDER — POTASSIUM CHLORIDE 20 MEQ PO PACK
20.0000 meq | PACK | Freq: Once | ORAL | Status: AC
  Administered 2019-01-09: 07:00:00 20 meq via ORAL
  Filled 2019-01-09: qty 1

## 2019-01-09 MED ORDER — POTASSIUM CHLORIDE 20 MEQ PO PACK
40.0000 meq | PACK | Freq: Two times a day (BID) | ORAL | Status: DC
  Administered 2019-01-09 (×2): 40 meq via ORAL
  Filled 2019-01-09 (×2): qty 2

## 2019-01-09 MED ORDER — PANTOPRAZOLE SODIUM 40 MG PO TBEC
40.0000 mg | DELAYED_RELEASE_TABLET | Freq: Two times a day (BID) | ORAL | Status: DC
  Administered 2019-01-09 – 2019-01-18 (×19): 40 mg via ORAL
  Filled 2019-01-09 (×19): qty 1

## 2019-01-09 MED ORDER — FUROSEMIDE 10 MG/ML IJ SOLN
80.0000 mg | Freq: Once | INTRAMUSCULAR | Status: AC
  Administered 2019-01-09: 14:00:00 80 mg via INTRAVENOUS
  Filled 2019-01-09: qty 8

## 2019-01-09 MED ORDER — ALBUMIN HUMAN 25 % IV SOLN
12.5000 g | Freq: Once | INTRAVENOUS | Status: AC
  Administered 2019-01-09: 14:00:00 12.5 g via INTRAVENOUS
  Filled 2019-01-09: qty 50

## 2019-01-09 MED ORDER — FUROSEMIDE 10 MG/ML IJ SOLN
80.0000 mg | Freq: Once | INTRAMUSCULAR | Status: AC
  Administered 2019-01-09: 20:00:00 80 mg via INTRAVENOUS
  Filled 2019-01-09: qty 8

## 2019-01-09 MED ORDER — ALBUMIN HUMAN 25 % IV SOLN
12.5000 g | Freq: Once | INTRAVENOUS | Status: AC
  Administered 2019-01-09: 20:00:00 12.5 g via INTRAVENOUS
  Filled 2019-01-09: qty 50

## 2019-01-09 NOTE — Progress Notes (Signed)
Patient ID: Yolanda Flores, female   DOB: Oct 08, 1985, 33 y.o.   MRN: [id number] [facility] Progress Note   Assessment/ Plan:   1. Acute kidney Injury: Appears to be consistent with ATN associated with SIRS from acute pancreatitis.  Had some response to albumin/furosemide overnight that I will repeat today after replacing potassium.  She appears to be approaching the plateau phase of ATN with smaller delta BUN/creatinine rise.  No indications for dialysis to prompt transfer to [facility] at this time. 2.  Acute pancreatitis with SIRS: Suspected to be from alcohol/biliary etiology.  Pantoprazole transitioned to oral route today by gastroenterology with advancement of diet. 3.  Hyponatremia: Secondary to acute kidney injury and impaired free water excretion.  Monitor with diuresis/renal recovery. 4.  Hypokalemia: Secondary to limited intake/diuretic challenge and ongoing cautious supplementation. 5.  Anion gap metabolic acidosis: Secondary to acute kidney injury/starvation ketosis, monitor with labs without ongoing bicarbonate supplementation.  Mild tachypnea noted. 6.  Hypophosphatemia: Secondary to impaired intake, improved with supplementation  Subjective:   Continues to have burning epigastric/substernal chest pain.  Some response to albumin/furosemide noted overnight.   Objective:   BP (!) 170/121   Pulse (!) 110   Temp 98.4 F (36.9 C) (Oral)   Resp (!) 27   Ht 5\' 7"  (1.702 m)   Wt 82 kg   LMP 12/14/2018 (Approximate)   SpO2 100%   BMI 28.31 kg/m   Intake/Output Summary (Last 24 hours) at 01/09/2019 1026 Last data filed at 01/09/2019 0900 Gross per 24 hour  Intake 700 ml  Output 611 ml  Net 89 ml   Weight change:   Physical Exam: Gen: Appears comfortable resting in bed, cooling pad on her chest/abdomen CVS: Pulse regular tachycardia, S1 and S2 normal Resp: Diminished breath sounds over bases, no distinct rales or rhonchi Abd: Soft, tenderness over  epigastric area Ext: Trace lower extremity edema, no asterixis.  Imaging: DG CHEST PORT 1 VIEW  Result Date: 01/09/2019 CLINICAL DATA:  Shortness of breath. EXAM: PORTABLE CHEST 1 VIEW COMPARISON:  01/06/2019 FINDINGS: Lungs are hypoinflated demonstrate persistent opacification over the left base/retrocardiac region with obscuration of the hemidiaphragm likely representing a small to moderate effusion with associated basilar atelectasis. Infection in the left base is possible. Cardiomediastinal silhouette and remainder of the exam is unchanged. IMPRESSION: Stable left base/retrocardiac opacification likely combination of effusion and atelectasis. Infection in the left base is possible. Electronically Signed   By: Marin Olp M.D.   On: 01/09/2019 04:45   Labs: BMET Recent Labs  Lab 01/05/19 0414 01/06/19 0854 01/07/19 0209 01/07/19 0943 01/07/19 1745 01/08/19 0832 01/08/19 1529 01/09/19 0249  NA 133* 134* 134*  --  134* 133* 131* 132*  132*  K 3.9 3.2* 3.3*  --  3.4* 3.0* 3.1* 2.9*  3.0*  CL 105 97* 97*  --  96* 94* 96* 97*  97*  CO2 13* 22 21*  --  20* 20* 19* 17*  17*  GLUCOSE 106* 165* 108*  --  112* 111* 130* 97  96  BUN 43* 58* 65*  --  70* 77* 78* 81*  82*  CREATININE 4.19* 6.75* 7.92*  --  8.87* 9.62* 10.07* 10.36*  10.33*  CALCIUM 5.9* 5.4* 6.1*  --  6.3* 6.3* 6.5* 6.8*  6.8*  PHOS 1.0*  --   --  <1.0* 2.8 2.3*  --  3.3  3.4   CBC Recent Labs  Lab 01/06/19 0854 01/07/19 [id number] 01/08/19 0832 01/09/19 0249  WBC 9.2 9.7 10.3 10.8*  NEUTROABS 8.0* 7.8* 6.4 6.4  HGB 10.1* 9.8* 10.1* 9.0*  HCT 28.9* 28.6* 28.5* 25.8*  MCV 95.4 95.7 92.8 92.5  PLT 146* 199 281 431*   Medications:    . Chlorhexidine Gluconate Cloth  6 each Topical Daily  . folic acid  1 mg Oral Daily  . furosemide  80 mg Intravenous Once  . furosemide  80 mg Intravenous Once  . heparin  5,000 Units Subcutaneous Q8H  . multivitamin with minerals  1 tablet Oral Daily  . pantoprazole  40 mg  Oral BID  . potassium chloride  40 mEq Oral BID  . thiamine  100 mg Oral Daily   Or  . thiamine  100 mg Intravenous Daily   Elmarie Shiley, MD 01/09/2019, 10:26 AM

## 2019-01-09 NOTE — Progress Notes (Addendum)
Daily Rounding Note  01/09/2019, 8:29 AM  LOS: 5 days   SUBJECTIVE:   Chief complaint:  Acute pancreatitis   Abdominal pain continues though improved.  Mid sternal discomfort persists.  Some shortness of breath. Urine output increased to 575 mL's yesterday. Stools brown, loose/soft.  OBJECTIVE:         Vital signs in last 24 hours:    Temp:  [98.4 F (36.9 C)-100.1 F (37.8 C)] 98.4 F (36.9 C) (12/27 0735) Pulse Rate:  [99-123] 105 (12/27 0700) Resp:  [20-27] 27 (12/27 0700) BP: (134-166)/(92-128) 158/112 (12/27 0700) SpO2:  [93 %-100 %] 97 % (12/27 0700) Weight:  [82 kg] 82 kg (12/27 0500) Last BM Date: 01/08/19 Filed Weights   01/05/19 1800 01/07/19 0500 01/09/19 0500  Weight: 76.3 kg 80 kg 82 kg   General: Looks mildly uncomfortable but well. Heart: Slightly tachycardic, regular. Chest: Diminished sounds at the bases, no adventitious sounds.  No dyspnea.  No cough. Abdomen: Tender without guarding or rebound on the left abdomen.  Bowel sounds hypoactive. Extremities: No CCE. Neuro/Psych: Fully alert and oriented.  Fluid speech.  No tremors or limb weakness.  Intake/Output from previous day: 12/26 0701 - 12/27 0700 In: 700 [P.O.:650; IV Piggyback:50] Out: 575 [Urine:575]  Intake/Output this shift: No intake/output data recorded.  Lab Results: Recent Labs    01/07/19 0943 01/08/19 0832 01/09/19 0249  WBC 9.7 10.3 10.8*  HGB 9.8* 10.1* 9.0*  HCT 28.6* 28.5* 25.8*  PLT 199 281 431*   BMET Recent Labs    01/08/19 0832 01/08/19 1529 01/09/19 0249  NA 133* 131* 132*  132*  K 3.0* 3.1* 2.9*  3.0*  CL 94* 96* 97*  97*  CO2 20* 19* 17*  17*  GLUCOSE 111* 130* 97  96  BUN 77* 78* 81*  82*  CREATININE 9.62* 10.07* 10.36*  10.33*  CALCIUM 6.3* 6.5* 6.8*  6.8*   LFT Recent Labs    01/07/19 0209 01/08/19 0832 01/09/19 0249  PROT 5.8* 6.3* 6.0*  ALBUMIN 2.6* 2.6* 2.6*  2.7*  AST  115* 50* 32  ALT 71* 50* 37  ALKPHOS 98 102 88  BILITOT 0.9 0.8 0.7   PT/INR No results for input(s): LABPROT, INR in the last 72 hours. Hepatitis Panel No results for input(s): HEPBSAG, HCVAB, HEPAIGM, HEPBIGM in the last 72 hours.  Studies/Results: DG CHEST PORT 1 VIEW  Result Date: 01/09/2019 CLINICAL DATA:  Shortness of breath. EXAM: PORTABLE CHEST 1 VIEW COMPARISON:  01/06/2019 FINDINGS: Lungs are hypoinflated demonstrate persistent opacification over the left base/retrocardiac region with obscuration of the hemidiaphragm likely representing a small to moderate effusion with associated basilar atelectasis. Infection in the left base is possible. Cardiomediastinal silhouette and remainder of the exam is unchanged. IMPRESSION: Stable left base/retrocardiac opacification likely combination of effusion and atelectasis. Infection in the left base is possible. Electronically Signed   By: Marin Olp M.D.   On: 01/09/2019 04:45   Scheduled Meds: . Chlorhexidine Gluconate Cloth  6 each Topical Daily  . folic acid  1 mg Oral Daily  . heparin  5,000 Units Subcutaneous Q8H  . multivitamin with minerals  1 tablet Oral Daily  . pantoprazole (PROTONIX) IV  40 mg Intravenous Q12H  . thiamine  100 mg Oral Daily   Or  . thiamine  100 mg Intravenous Daily   Continuous Infusions: PRN Meds:.acetaminophen, alum & mag hydroxide-simeth **AND** lidocaine, bisacodyl, hydrALAZINE, labetalol, morphine injection, ondansetron **OR**  ondansetron (ZOFRAN) IV, oxyCODONE, phenol, polyethylene glycol   ASSESMENT:   *   Acute pancreatitis w SIRS.  Slowly recovering.  *    AKI.  Continues.  Renal following.   Urine output improved but still oliguric.  *   Hypokalemia.  Hyponatremia  *   [place] anemia.    *  L pulm effusion/atx, possible infection.      PLAN   *   As I do not feel she is getting much more benefit out of IV Protonix, will switch to oral Protonix 40 bid.  Diet has been advanced to full  liquids.  *   Reminded patient to use her incentive spirometry apparatus as frequently as possible.    Azucena Freed  01/09/2019, 8:29 AM Phone [phone]     Attending physician's note   I have taken an interval history, reviewed the chart and examined the patient. I agree with the Advanced Practitioner's note, impression and recommendations.   Acute severe pancreatitis d/t ETOH with SIRS/acuteperipancreatic fluid collection. Necrosis could not be assessed d/tlack of IV contraston MRCP. Nocholelithiasis/CBD dilatation.  ARF (Cr 10 today) on Alb/lasix Left pleural effusion  ??  Atelectasis vs early pneumonia.  Holding antibiotics currently. Electrolyte imbalance Burning chest pain despite Protonix and GI cocktail  Plan: -On full liquid diet.  If tolerates well, will try renal diet. -Ambulate, incentive spirometry. -Continue supportive care for now. -Appreciate renal consultation. -Trend CBC, lytes. -No GI intervention planned.  Will check back in 2 to 3 days. -Dr. Silverio Decamp taking over service tomorrow.  Carmell Austria, MD Velora Heckler Fabienne Bruns [phone].

## 2019-01-09 NOTE — Progress Notes (Signed)
PROGRESS NOTE  Yolanda Flores [id number] DOB: 1985/10/05 DOA: 01/04/2019 PCP: Patient, No Pcp Per   LOS: 5 days   Brief Narrative / Interim history: 33 year old female with HTN, GERD, alcohol abuse who came into the hospital and was admitted on 01/04/2019 with abdominal pain, nausea and vomiting.  She was found to have acute pancreatitis in the setting of EtOH use as she has been drinking prior to admission.  She was also found to be in acute kidney injury, and gastroenterology and nephrology have been consulted  Subjective / 24h Interval events: She is feeling a lot better this morning, less abdominal pain, no further nausea and vomiting.  Tells me she has been making more urine overnight.  Persistent reflux type symptoms/burning  Assessment & Plan: Principal Problem Acute pancreatitis/SIRS -GI consulted and following. Possibly due to ETOH -she is clinically improving, advance diet to full liquids today  -no cholelithiasis / CBD dilatation  Active Problems Acute kidney injury, likely ATN/elevated anion gap metabolic acidosis -Due to dehydration from pancreatitis, as well as ATN in the setting of episodic hypotension -Nephrology following, creatinine has remained quite elevated around 10 but overall stable -She was initially given fluid challenge, now net positive and started on diuresis with albumin and Lasix yesterday. Making urine, no dialysis as of now per nephrology  Elevated LFTs  -In a pattern consistent with alcohol abuse, improving  Alcohol abuse -will need complete cessation moving forward  Hypokalemia -monitor, caution with aggressive supplementation  Hyponatremia -Due to AKI/impaired free water excretion, monitor with diuresis  Scheduled Meds: . Chlorhexidine Gluconate Cloth  6 each Topical Daily  . folic acid  1 mg Oral Daily  . furosemide  80 mg Intravenous Once  . furosemide  80 mg Intravenous Once  . heparin  5,000 Units Subcutaneous Q8H  .  multivitamin with minerals  1 tablet Oral Daily  . pantoprazole  40 mg Oral BID  . potassium chloride  40 mEq Oral BID  . thiamine  100 mg Oral Daily   Or  . thiamine  100 mg Intravenous Daily   Continuous Infusions: . albumin human    . albumin human     PRN Meds:.acetaminophen, alum & mag hydroxide-simeth **AND** lidocaine, bisacodyl, hydrALAZINE, labetalol, morphine injection, ondansetron **OR** ondansetron (ZOFRAN) IV, oxyCODONE, phenol, polyethylene glycol  DVT prophylaxis: Heparin Code Status: Full code Family Communication: No family at bedside, discussed with patient Disposition Plan: Home when cleared by nephrology and GI  Consultants:  GI Nephrology PCCM  Procedures:  None   Microbiology  SARS-CoV-2 12/22-negative  Antimicrobials: None     Objective: Vitals:   01/09/19 0735 01/09/19 0800 01/09/19 0919 01/09/19 1130  BP:   (!) 170/121   Pulse:   (!) 110   Resp:   (!) 27   Temp: 98.4 F (36.9 C)   98.7 F (37.1 C)  TempSrc: Oral   Oral  SpO2:  99% 100%   Weight:      Height:        Intake/Output Summary (Last 24 hours) at 01/09/2019 1142 Last data filed at 01/09/2019 0900 Gross per 24 hour  Intake 700 ml  Output 586 ml  Net 114 ml   Filed Weights   01/05/19 1800 01/07/19 0500 01/09/19 0500  Weight: 76.3 kg 80 kg 82 kg    Examination:  Constitutional: NAD Eyes: no scleral icterus ENMT: Mucous membranes are moist.  Neck: normal, supple Respiratory: clear to auscultation bilaterally, no wheezing, no crackles. Normal respiratory effort.  Cardiovascular: Regular rate and rhythm, no murmurs / rubs / gallops. No LE edema.  Abdomen: non distended, mild epigastric tenderness but no guarding or rebound. Bowel sounds positive.  Musculoskeletal: no clubbing / cyanosis.  Skin: no rashes Neurologic: CN 2-12 grossly intact. Strength 5/5 in all 4.  Psychiatric: Normal judgment and insight. Alert and oriented x 3. Normal mood.    Data Reviewed: I have  independently reviewed following labs and imaging studies   CBC: Recent Labs  Lab 01/05/19 0414 01/06/19 0854 01/07/19 0943 01/08/19 0832 01/09/19 0249  WBC 7.4 9.2 9.7 10.3 10.8*  NEUTROABS 6.5 8.0* 7.8* 6.4 6.4  HGB 12.4 10.1* 9.8* 10.1* 9.0*  HCT 37.6 28.9* 28.6* 28.5* 25.8*  MCV 99.2 95.4 95.7 92.8 92.5  PLT 176 146* 199 281 [id number]*   Basic Metabolic Panel: Recent Labs  Lab 01/05/19 0414 01/07/19 0209 01/07/19 0943 01/07/19 1745 01/08/19 0832 01/08/19 1529 01/09/19 0249  NA 133* 134*  --  134* 133* 131* 132*  132*  K 3.9 3.3*  --  3.4* 3.0* 3.1* 2.9*  3.0*  CL 105 97*  --  96* 94* 96* 97*  97*  CO2 13* 21*  --  20* 20* 19* 17*  17*  GLUCOSE 106* 108*  --  112* 111* 130* 97  96  BUN 43* 65*  --  70* 77* 78* 81*  82*  CREATININE 4.19* 7.92*  --  8.87* 9.62* 10.07* 10.36*  10.33*  CALCIUM 5.9* 6.1*  --  6.3* 6.3* 6.5* 6.8*  6.8*  MG 1.3*  --  1.8 2.1 2.2  --  2.1  PHOS 1.0*  --  <1.0* 2.8 2.3*  --  3.3  3.4   Liver Function Tests: Recent Labs  Lab 01/05/19 0414 01/06/19 0854 01/07/19 0209 01/08/19 0832 01/09/19 0249  AST 538* 185* 115* 50* 32  ALT 160* 90* 71* 50* 37  ALKPHOS 85 96 98 102 88  BILITOT 1.5* 0.9 0.9 0.8 0.7  PROT 6.1* 5.6* 5.8* 6.3* 6.0*  ALBUMIN 3.0* 2.6* 2.6* 2.6* 2.6*  2.7*   Coagulation Profile: No results for input(s): INR, PROTIME in the last 168 hours. HbA1C: No results for input(s): HGBA1C in the last 72 hours. CBG: Recent Labs  Lab 01/05/19 1209 01/06/19 0745 01/07/19 0727 01/08/19 0816 01/09/19 0736  GLUCAP 107* 155* 113* 112* 91    Recent Results (from the past 240 hour(s))  Respiratory Panel by RT PCR (Flu A&B, Covid) - Nasopharyngeal Swab     Status: None   Collection Time: 01/04/19  1:08 PM   Specimen: Nasopharyngeal Swab  Result Value Ref Range Status   SARS Coronavirus 2 by RT PCR NEGATIVE NEGATIVE Final    Comment: (NOTE) SARS-CoV-2 target nucleic acids are NOT DETECTED. The SARS-CoV-2 RNA is generally  detectable in upper respiratoy specimens during the acute phase of infection. The lowest concentration of SARS-CoV-2 viral copies this assay can detect is 131 copies/mL. A negative result does not preclude SARS-Cov-2 infection and should not be used as the sole basis for treatment or other patient management decisions. A negative result may occur with  improper specimen collection/handling, submission of specimen other than nasopharyngeal swab, presence of viral mutation(s) within the areas targeted by this assay, and inadequate number of viral copies (<131 copies/mL). A negative result must be combined with clinical observations, patient history, and epidemiological information. The expected result is Negative. Fact Sheet for Patients:  PinkCheek.be Fact Sheet for Healthcare Providers:  GravelBags.it This test is not yet  ap proved or cleared by the [place] and  has been authorized for detection and/or diagnosis of SARS-CoV-2 by FDA under an Emergency Use Authorization (EUA). This EUA will remain  in effect (meaning this test can be used) for the duration of the COVID-19 declaration under Section 564(b)(1) of the Act, 21 U.S.C. section 360bbb-3(b)(1), unless the authorization is terminated or revoked sooner.    Influenza A by PCR NEGATIVE NEGATIVE Final   Influenza B by PCR NEGATIVE NEGATIVE Final    Comment: (NOTE) The Xpert Xpress SARS-CoV-2/FLU/RSV assay is intended as an aid in  the diagnosis of influenza from Nasopharyngeal swab specimens and  should not be used as a sole basis for treatment. Nasal washings and  aspirates are unacceptable for Xpert Xpress SARS-CoV-2/FLU/RSV  testing. Fact Sheet for Patients: PinkCheek.be Fact Sheet for Healthcare Providers: GravelBags.it This test is not yet approved or cleared by the [place] FDA and  has been  authorized for detection and/or diagnosis of SARS-CoV-2 by  FDA under an Emergency Use Authorization (EUA). This EUA will remain  in effect (meaning this test can be used) for the duration of the  Covid-19 declaration under Section 564(b)(1) of the Act, 21  U.S.C. section 360bbb-3(b)(1), unless the authorization is  terminated or revoked. Performed at [facility], [place] [address], [place], [place] [address]   MRSA PCR Screening     Status: None   Collection Time: 01/06/19  3:40 AM   Specimen: Nasal Mucosa; Nasopharyngeal  Result Value Ref Range Status   MRSA by PCR NEGATIVE NEGATIVE Final    Comment:        The GeneXpert MRSA Assay (FDA approved for NASAL specimens only), is one component of a comprehensive MRSA colonization surveillance program. It is not intended to diagnose MRSA infection nor to guide or monitor treatment for MRSA infections. Performed at [facility], [place] [address], [place], [place] [address]      Radiology Studies: DG CHEST PORT 1 VIEW  Result Date: 01/09/2019 CLINICAL DATA:  Shortness of breath. EXAM: PORTABLE CHEST 1 VIEW COMPARISON:  01/06/2019 FINDINGS: Lungs are hypoinflated demonstrate persistent opacification over the left base/retrocardiac region with obscuration of the hemidiaphragm likely representing a small to moderate effusion with associated basilar atelectasis. Infection in the left base is possible. Cardiomediastinal silhouette and remainder of the exam is unchanged. IMPRESSION: Stable left base/retrocardiac opacification likely combination of effusion and atelectasis. Infection in the left base is possible. Electronically Signed   By: Marin Olp M.D.   On: 01/09/2019 04:45   Marzetta Board, MD, PhD [facility]  Between 7 am - 7 pm I am available, please contact me via Amion or Securechat  Between 7 pm - 7 am I am not available, please contact night coverage MD/APP via Amion

## 2019-01-10 LAB — PROTIME-INR
INR: 1 (ref 0.8–1.2)
Prothrombin Time: 13.5 seconds (ref 11.4–15.2)

## 2019-01-10 LAB — CBC
HCT: 23.6 % — ABNORMAL LOW (ref 36.0–46.0)
Hemoglobin: 8.4 g/dL — ABNORMAL LOW (ref 12.0–15.0)
MCH: 32.8 pg (ref 26.0–34.0)
MCHC: 35.6 g/dL (ref 30.0–36.0)
MCV: 92.2 fL (ref 80.0–100.0)
Platelets: 566 10*3/uL — ABNORMAL HIGH (ref 150–400)
RBC: 2.56 MIL/uL — ABNORMAL LOW (ref 3.87–5.11)
RDW: 14.6 % (ref 11.5–15.5)
WBC: 15.1 10*3/uL — ABNORMAL HIGH (ref 4.0–10.5)
nRBC: 0 % (ref 0.0–0.2)

## 2019-01-10 LAB — RENAL FUNCTION PANEL
Albumin: 2.9 g/dL — ABNORMAL LOW (ref 3.5–5.0)
Anion gap: 20 — ABNORMAL HIGH (ref 5–15)
BUN: 89 mg/dL — ABNORMAL HIGH (ref 6–20)
CO2: 16 mmol/L — ABNORMAL LOW (ref 22–32)
Calcium: 7.2 mg/dL — ABNORMAL LOW (ref 8.9–10.3)
Chloride: 96 mmol/L — ABNORMAL LOW (ref 98–111)
Creatinine, Ser: 10.85 mg/dL — ABNORMAL HIGH (ref 0.44–1.00)
GFR calc Af Amer: 5 mL/min — ABNORMAL LOW (ref >60)
GFR calc non Af Amer: 4 mL/min — ABNORMAL LOW (ref >60)
Glucose, Bld: 89 mg/dL (ref 70–99)
Phosphorus: 2.9 mg/dL (ref 2.5–4.6)
Potassium: 3.2 mmol/L — ABNORMAL LOW (ref 3.5–5.1)
Sodium: 132 mmol/L — ABNORMAL LOW (ref 135–145)

## 2019-01-10 LAB — GLUCOSE, CAPILLARY: Glucose-Capillary: 75 mg/dL (ref 70–99)

## 2019-01-10 MED ORDER — ALUM & MAG HYDROXIDE-SIMETH 200-200-20 MG/5ML PO SUSP
30.0000 mL | Freq: Once | ORAL | Status: AC
  Administered 2019-01-10: 14:00:00 30 mL via ORAL
  Filled 2019-01-10: qty 30

## 2019-01-10 MED ORDER — SODIUM CHLORIDE 0.45 % IV SOLN: INTRAVENOUS | Status: DC

## 2019-01-10 MED ORDER — LIDOCAINE VISCOUS HCL 2 % MT SOLN
15.0000 mL | Freq: Once | OROMUCOSAL | Status: AC
  Administered 2019-01-10: 14:00:00 15 mL via ORAL
  Filled 2019-01-10: qty 15

## 2019-01-10 MED ORDER — OXYMETAZOLINE HCL 0.05 % NA SOLN
1.0000 | Freq: Two times a day (BID) | NASAL | Status: DC
  Administered 2019-01-10 – 2019-01-18 (×14): 1 via NASAL
  Filled 2019-01-10 (×2): qty 15

## 2019-01-10 MED ORDER — DICYCLOMINE HCL 10 MG/5ML PO SOLN
10.0000 mg | Freq: Once | ORAL | Status: AC
  Administered 2019-01-10: 14:00:00 10 mg via ORAL
  Filled 2019-01-10: qty 5

## 2019-01-10 MED ORDER — AMLODIPINE BESYLATE 10 MG PO TABS
10.0000 mg | ORAL_TABLET | Freq: Every day | ORAL | Status: DC
  Administered 2019-01-10 – 2019-01-18 (×9): 10 mg via ORAL
  Filled 2019-01-10 (×9): qty 1

## 2019-01-10 MED ORDER — POTASSIUM CHLORIDE CRYS ER 20 MEQ PO TBCR
40.0000 meq | EXTENDED_RELEASE_TABLET | Freq: Once | ORAL | Status: AC
  Administered 2019-01-10: 40 meq via ORAL
  Filled 2019-01-10: qty 2

## 2019-01-10 MED ORDER — HYOSCYAMINE SULFATE 0.125 MG SL SUBL
0.2500 mg | SUBLINGUAL_TABLET | Freq: Once | SUBLINGUAL | Status: AC
  Administered 2019-01-10: 14:00:00 0.25 mg via SUBLINGUAL
  Filled 2019-01-10: qty 2

## 2019-01-10 NOTE — Progress Notes (Signed)
PROGRESS NOTE  Yolanda Flores [id number] DOB: 1985-10-08 DOA: 01/04/2019 PCP: Patient, No Pcp Per   LOS: 6 days   Brief Narrative / Interim history: 33 year old female with HTN, GERD, alcohol abuse who came into the hospital and was admitted on 01/04/2019 with abdominal pain, nausea and vomiting.  She was found to have acute pancreatitis in the setting of EtOH use as she has been drinking prior to admission.  She was also found to be in acute kidney injury, and gastroenterology and nephrology have been consulted  Subjective / 24h Interval events: She still complaining of reflux type discomfort but abdominal pain much better.  Also had an episode of nosebleed after blowing her nose around lunchtime  Assessment & Plan: Principal Problem Acute pancreatitis/SIRS -GI consulted and following. Possibly due to ETOH -she is clinically improving, advance diet as tolerated -no cholelithiasis / CBD dilatation  Active Problems Acute kidney injury, likely ATN/elevated anion gap metabolic acidosis -Due to dehydration from pancreatitis, as well as ATN in the setting of episodic hypotension -Nephrology following, creatinine has remained quite elevated around 10 but overall stable -She was initially given fluid challenge, now net positive and started on diuresis with albumin and Lasix. -No significant improvement over the last couple of days  Epistaxis -No history of the same, unclear trigger.  Platelets are normal, check an INR. -Afrin as needed  Elevated LFTs  -In a pattern consistent with alcohol abuse, improving  Alcohol abuse -will need complete cessation moving forward  Hypokalemia -monitor, caution with aggressive supplementation  Hyponatremia -Due to AKI/impaired free water excretion, monitor with diuresis  Scheduled Meds: . alum & mag hydroxide-simeth  30 mL Oral Once   And  . lidocaine  15 mL Oral Once  . amLODipine  10 mg Oral Daily  . Chlorhexidine Gluconate Cloth  6  each Topical Daily  . dicyclomine  10 mg Oral Once  . folic acid  1 mg Oral Daily  . heparin  5,000 Units Subcutaneous Q8H  . hyoscyamine  0.25 mg Sublingual Once  . multivitamin with minerals  1 tablet Oral Daily  . oxymetazoline  1 spray Each Nare BID  . pantoprazole  40 mg Oral BID  . thiamine  100 mg Oral Daily   Or  . thiamine  100 mg Intravenous Daily   Continuous Infusions:  PRN Meds:.acetaminophen, alum & mag hydroxide-simeth **AND** lidocaine, bisacodyl, hydrALAZINE, labetalol, morphine injection, ondansetron **OR** ondansetron (ZOFRAN) IV, oxyCODONE, phenol, polyethylene glycol  DVT prophylaxis: Heparin Code Status: Full code Family Communication: No family at bedside, discussed with patient Disposition Plan: Home when cleared by nephrology and GI  Consultants:  GI Nephrology PCCM  Procedures:  None   Microbiology  SARS-CoV-2 12/22-negative  Antimicrobials: None     Objective: Vitals:   01/10/19 0800 01/10/19 0900 01/10/19 1000 01/10/19 1147  BP: (!) 162/110 (!) 159/99 (!) 147/115   Pulse: (!) 118 (!) 133 (!) 123   Resp: (!) 27 18 (!) 24   Temp:    99.4 F (37.4 C)  TempSrc:    Axillary  SpO2: 96% 96% 96%   Weight:      Height:        Intake/Output Summary (Last 24 hours) at 01/10/2019 1308 Last data filed at 01/10/2019 0645 Gross per 24 hour  Intake 37.47 ml  Output 925 ml  Net -887.53 ml   Filed Weights   01/07/19 0500 01/09/19 0500 01/10/19 0500  Weight: 80 kg 82 kg 80.6 kg    Examination:  Constitutional: NAD Eyes: No scleral icterus ENMT: Moist external drains Neck: normal, supple Respiratory: CTA bilaterally without wheezing or crackles Cardiovascular: Regular rate and rhythm, no murmurs, no peripheral edema Abdomen: Soft, nondistended, nontender, bowel sounds positive Musculoskeletal: no clubbing / cyanosis.  Skin: No rashes seen Neurologic: No focal deficits, equal strength Psychiatric: Normal judgment and insight. Alert and  oriented x 3. Normal mood.    Data Reviewed: I have independently reviewed following labs and imaging studies   CBC: Recent Labs  Lab 01/05/19 0414 01/06/19 0854 01/07/19 0943 01/08/19 0832 01/09/19 0249 01/10/19 0302  WBC 7.4 9.2 9.7 10.3 10.8* 15.1*  NEUTROABS 6.5 8.0* 7.8* 6.4 6.4  --   HGB 12.4 10.1* 9.8* 10.1* 9.0* 8.4*  HCT 37.6 28.9* 28.6* 28.5* 25.8* 23.6*  MCV 99.2 95.4 95.7 92.8 92.5 92.2  PLT 176 146* 199 281 431* [id number]*   Basic Metabolic Panel: Recent Labs  Lab 01/05/19 0414 01/07/19 0943 01/07/19 1745 01/08/19 0832 01/08/19 1529 01/09/19 0249 01/10/19 0302  NA 133*  --  134* 133* 131* 132*  132* 132*  K 3.9  --  3.4* 3.0* 3.1* 2.9*  3.0* 3.2*  CL 105  --  96* 94* 96* 97*  97* 96*  CO2 13*  --  20* 20* 19* 17*  17* 16*  GLUCOSE 106*  --  112* 111* 130* 97  96 89  BUN 43*  --  70* 77* 78* 81*  82* 89*  CREATININE 4.19*  --  8.87* 9.62* 10.07* 10.36*  10.33* 10.85*  CALCIUM 5.9*  --  6.3* 6.3* 6.5* 6.8*  6.8* 7.2*  MG 1.3* 1.8 2.1 2.2  --  2.1  --   PHOS 1.0* <1.0* 2.8 2.3*  --  3.3  3.4 2.9   Liver Function Tests: Recent Labs  Lab 01/05/19 0414 01/06/19 0854 01/07/19 0209 01/08/19 0832 01/09/19 0249 01/10/19 0302  AST 538* 185* 115* 50* 32  --   ALT 160* 90* 71* 50* 37  --   ALKPHOS 85 96 98 102 88  --   BILITOT 1.5* 0.9 0.9 0.8 0.7  --   PROT 6.1* 5.6* 5.8* 6.3* 6.0*  --   ALBUMIN 3.0* 2.6* 2.6* 2.6* 2.6*  2.7* 2.9*   Coagulation Profile: Recent Labs  Lab 01/10/19 1230  INR 1.0   HbA1C: No results for input(s): HGBA1C in the last 72 hours. CBG: Recent Labs  Lab 01/06/19 0745 01/07/19 0727 01/08/19 0816 01/09/19 0736 01/10/19 0737  GLUCAP 155* 113* 112* 91 75    Recent Results (from the past 240 hour(s))  Respiratory Panel by RT PCR (Flu A&B, Covid) - Nasopharyngeal Swab     Status: None   Collection Time: 01/04/19  1:08 PM   Specimen: Nasopharyngeal Swab  Result Value Ref Range Status   SARS Coronavirus 2 by RT PCR  NEGATIVE NEGATIVE Final    Comment: (NOTE) SARS-CoV-2 target nucleic acids are NOT DETECTED. The SARS-CoV-2 RNA is generally detectable in upper respiratoy specimens during the acute phase of infection. The lowest concentration of SARS-CoV-2 viral copies this assay can detect is 131 copies/mL. A negative result does not preclude SARS-Cov-2 infection and should not be used as the sole basis for treatment or other patient management decisions. A negative result may occur with  improper specimen collection/handling, submission of specimen other than nasopharyngeal swab, presence of viral mutation(s) within the areas targeted by this assay, and inadequate number of viral copies (<131 copies/mL). A negative result must be combined with clinical  observations, patient history, and epidemiological information. The expected result is Negative. Fact Sheet for Patients:  PinkCheek.be Fact Sheet for Healthcare Providers:  GravelBags.it This test is not yet ap proved or cleared by the [place] FDA and  has been authorized for detection and/or diagnosis of SARS-CoV-2 by FDA under an Emergency Use Authorization (EUA). This EUA will remain  in effect (meaning this test can be used) for the duration of the COVID-19 declaration under Section 564(b)(1) of the Act, 21 U.S.C. section 360bbb-3(b)(1), unless the authorization is terminated or revoked sooner.    Influenza A by PCR NEGATIVE NEGATIVE Final   Influenza B by PCR NEGATIVE NEGATIVE Final    Comment: (NOTE) The Xpert Xpress SARS-CoV-2/FLU/RSV assay is intended as an aid in  the diagnosis of influenza from Nasopharyngeal swab specimens and  should not be used as a sole basis for treatment. Nasal washings and  aspirates are unacceptable for Xpert Xpress SARS-CoV-2/FLU/RSV  testing. Fact Sheet for Patients: PinkCheek.be Fact Sheet for Healthcare  Providers: GravelBags.it This test is not yet approved or cleared by the [place] FDA and  has been authorized for detection and/or diagnosis of SARS-CoV-2 by  FDA under an Emergency Use Authorization (EUA). This EUA will remain  in effect (meaning this test can be used) for the duration of the  Covid-19 declaration under Section 564(b)(1) of the Act, 21  U.S.C. section 360bbb-3(b)(1), unless the authorization is  terminated or revoked. Performed at [facility], [place] [address] [place], [place] [address]   MRSA PCR Screening     Status: None   Collection Time: 01/06/19  3:40 AM   Specimen: Nasal Mucosa; Nasopharyngeal  Result Value Ref Range Status   MRSA by PCR NEGATIVE NEGATIVE Final    Comment:        The GeneXpert MRSA Assay (FDA approved for NASAL specimens only), is one component of a comprehensive MRSA colonization surveillance program. It is not intended to diagnose MRSA infection nor to guide or monitor treatment for MRSA infections. Performed at [facility], [place] [address] [place], [place] [address]      Radiology Studies: No results found. Marzetta Board, MD, PhD [facility]  Between 7 am - 7 pm I am available, please contact me via Amion or Securechat  Between 7 pm - 7 am I am not available, please contact night coverage MD/APP via Amion

## 2019-01-10 NOTE — Progress Notes (Addendum)
[facility] Progress Note  CC:  Acute pancreatitis   Subjective: She complains of having acid reflux. Burning discomfort in esophagus occurs after drinking fluids, eating solid food or swallowing pills. Food and pills sometimes feels stuck to the lower esophagus area for 2 hours then passes. No nausea or vomiting. No abdominal pain. She passed a loose brown stool at 2am. No rectal bleeding or melena.    Objective:  Vital signs in last 24 hours: Temp:  [98.2 F (36.8 C)-99 F (37.2 C)] 98.5 F (36.9 C) (12/28 0725) Pulse Rate:  [41-133] 123 (12/28 1000) Resp:  [18-28] 24 (12/28 1000) BP: (122-168)/(80-117) 147/115 (12/28 1000) SpO2:  [93 %-98 %] 96 % (12/28 1000) Weight:  [80.6 kg] 80.6 kg (12/28 0500) Last BM Date: 01/09/19 General:   Alert 33 year old female in NAD. Heart:  Tachycardic, no murmur or rub.  Pulm: Breath sounds diminished in the bases otherwise clear.  Abdomen: Soft, nontender, + BS x 4 quads, no HSM. Extremities:  Without edema. Neurologic:  Alert and  oriented x4;  grossly normal neurologically. Psych:  Alert and cooperative. Normal mood and affect.  Intake/Output from previous day: 12/27 0701 - 12/28 0700 In: 37.5 [IV Piggyback:37.5] Out: [id number] [Urine:1060; Stool:1] Intake/Output this shift: No intake/output data recorded.  Lab Results: Recent Labs    01/08/19 0832 01/09/19 0249 01/10/19 0302  WBC 10.3 10.8* 15.1*  HGB 10.1* 9.0* 8.4*  HCT 28.5* 25.8* 23.6*  PLT 281 431* 566*   BMET Recent Labs    01/08/19 1529 01/09/19 0249 01/10/19 0302  NA 131* 132*  132* 132*  K 3.1* 2.9*  3.0* 3.2*  CL 96* 97*  97* 96*  CO2 19* 17*  17* 16*  GLUCOSE 130* 97  96 89  BUN 78* 81*  82* 89*  CREATININE 10.07* 10.36*  10.33* 10.85*  CALCIUM 6.5* 6.8*  6.8* 7.2*   LFT Recent Labs    01/09/19 0249 01/10/19 0302  PROT 6.0*  --   ALBUMIN 2.6*  2.7* 2.9*  AST 32  --   ALT 37  --   ALKPHOS 88  --   BILITOT 0.7  --     PT/INR No results for input(s): LABPROT, INR in the last 72 hours. Hepatitis Panel No results for input(s): HEPBSAG, HCVAB, HEPAIGM, HEPBIGM in the last 72 hours.  DG CHEST PORT 1 VIEW  Result Date: 01/09/2019 CLINICAL DATA:  Shortness of breath. EXAM: PORTABLE CHEST 1 VIEW COMPARISON:  01/06/2019 FINDINGS: Lungs are hypoinflated demonstrate persistent opacification over the left base/retrocardiac region with obscuration of the hemidiaphragm likely representing a small to moderate effusion with associated basilar atelectasis. Infection in the left base is possible. Cardiomediastinal silhouette and remainder of the exam is unchanged. IMPRESSION: Stable left base/retrocardiac opacification likely combination of effusion and atelectasis. Infection in the left base is possible. Electronically Signed   By: Marin Olp M.D.   On: 01/09/2019 04:45    Assessment / Plan:  1. Acute pancreatitis secondary to alcohol use with SIRS. She is tachycardic HR 110's -130's. Afebrile. WBC 15.1 >> 10.8. Normal LFTs.  RUQ sono 12/22 without evidence of gallstones or biliary duct dilatation. Abd MRI wo contrast  12/23 without evidence of pancreatic pseudocyst or necrosis.  -pain management per hospitalist  -repeat CBC in am   2. Reflux symptoms, dysphagia  -consider EGD as outpatient  -Maalox/Mylanta Q 6 hrs PRN -Continue Pantoprazole 40mg  po bid if ok with nephrology   3. AKI secondary  to  ATN + SIRS/acute pancreatitis. Cr 10.85 >> 10.36. -nephrology following   4. Normocytic anemia. Hg 8.4 >> 9.0. HCT 23.6. MCV 92.2. No signs of active GI   5. Hyponatremia. Na+ 132.  6. Hypokalemia. K+ 3.2. -KCL management per hospitalist/nephrology   Further recommendations per Dr. Silverio Decamp     Active Problems:   HTN (hypertension)   Lactic acidosis   Alcohol abuse   Abnormal LFTs   Acute gallstone pancreatitis   Pancreatitis   Acute renal failure ([place])   Shock circulatory ([place])     LOS: 6 days    Noralyn Pick  01/10/2019, 10:34 AM    Attending physician's note   I have taken an interval history, reviewed the chart and examined the patient. I agree with the Advanced Practitioner's note, impression and recommendations.   Acute alcoholic pancreatitis, improving.  Denies any abdominal pain.  Complains of severe heartburn, continue PPI twice daily.  She was having intractable nausea and vomiting for 48-72 hours prior to admission, likely has severe erosive esophagitis GI cocktail 10 cc every 6 hours as needed for symptom relief Advance diet as tolerated Antireflux measures  LFT trended down to baseline.  Elevated transaminases likely secondary to shock liver No evidence of biliary obstruction  AKI with elevated creatinine~10, nephrology is following patient  Discussed alcohol cessation with patient  We will sign off, available if have any further questions or concerns.  Damaris Hippo , MD [phone]

## 2019-01-10 NOTE — Progress Notes (Signed)
Patient ID: Yolanda Flores, female   DOB: 1985-12-07, 33 y.o.   MRN: [id number]  [facility] Progress Note   Assessment/ Plan:   1. Acute kidney Injury: Appears to be consistent with ATN associated with SIRS from acute pancreatitis.  Good UOP w/ lasix x 1 yesterday.  Creat appears to be peaking, no uremic symptoms. No indications for dialysis to prompt transfer to [facility] at this time. Will resume gentle IVF"s. F/u Creat in am.  2.  Acute pancreatitis with SIRS: Suspected to be from alcohol/biliary etiology.  Pantoprazole transitioned to oral route today by gastroenterology with advancement of diet. 3.  Hyponatremia: Secondary to acute kidney injury and impaired free water excretion.  Monitor with diuresis/renal recovery. 4.  Hypokalemia: Secondary to limited intake/diuretic challenge and ongoing cautious supplementation. 5.  Anion gap metabolic acidosis: Secondary to acute kidney injury/starvation ketosis, monitor with labs without ongoing bicarbonate supplementation.  6.  Hypophosphatemia: Secondary to impaired intake, improved with supplementation  Kelly Splinter, MD 01/10/2019, 10:02 PM      Subjective:   Continues to c/o reflux / heartburn.  No nausea , emesis or confusion or lethargy noted.    Objective:   BP (!) 152/79   Pulse (!) 116   Temp 98.7 F (37.1 C) (Oral)   Resp (!) 27   Ht 5\' 7"  (1.702 m)   Wt 80.6 kg   LMP 12/14/2018 (Approximate)   SpO2 96%   BMI 27.83 kg/m   Physical Exam: Gen: Appears alert, O x3, pleasant. comfortable resting in bed, no distress CVS: Pulse regular tachycardia, S1 and S2 normal Resp: Diminished breath sounds over bases, no distinct rales or rhonchi Abd: Soft, tenderness over epigastric area Ext: no lower extremity edema, no asterixis.  Medications:    . amLODipine  10 mg Oral Daily  . Chlorhexidine Gluconate Cloth  6 each Topical Daily  . folic acid  1 mg Oral Daily  . heparin  5,000 Units Subcutaneous Q8H  .  multivitamin with minerals  1 tablet Oral Daily  . oxymetazoline  1 spray Each Nare BID  . pantoprazole  40 mg Oral BID  . thiamine  100 mg Oral Daily   Or  . thiamine  100 mg Intravenous Daily

## 2019-01-11 LAB — CBC
HCT: 24.2 % — ABNORMAL LOW (ref 36.0–46.0)
Hemoglobin: 8.4 g/dL — ABNORMAL LOW (ref 12.0–15.0)
MCH: 31.9 pg (ref 26.0–34.0)
MCHC: 34.7 g/dL (ref 30.0–36.0)
MCV: 92 fL (ref 80.0–100.0)
Platelets: 692 10*3/uL — ABNORMAL HIGH (ref 150–400)
RBC: 2.63 MIL/uL — ABNORMAL LOW (ref 3.87–5.11)
RDW: 14.7 % (ref 11.5–15.5)
WBC: 20.8 10*3/uL — ABNORMAL HIGH (ref 4.0–10.5)
nRBC: 0 % (ref 0.0–0.2)

## 2019-01-11 LAB — RENAL FUNCTION PANEL
Albumin: 2.8 g/dL — ABNORMAL LOW (ref 3.5–5.0)
Anion gap: 19 — ABNORMAL HIGH (ref 5–15)
BUN: 90 mg/dL — ABNORMAL HIGH (ref 6–20)
CO2: 17 mmol/L — ABNORMAL LOW (ref 22–32)
Calcium: 8 mg/dL — ABNORMAL LOW (ref 8.9–10.3)
Chloride: 98 mmol/L (ref 98–111)
Creatinine, Ser: 10.23 mg/dL — ABNORMAL HIGH (ref 0.44–1.00)
GFR calc Af Amer: 5 mL/min — ABNORMAL LOW (ref >60)
GFR calc non Af Amer: 5 mL/min — ABNORMAL LOW (ref >60)
Glucose, Bld: 107 mg/dL — ABNORMAL HIGH (ref 70–99)
Phosphorus: 2.3 mg/dL — ABNORMAL LOW (ref 2.5–4.6)
Potassium: 3.5 mmol/L (ref 3.5–5.1)
Sodium: 134 mmol/L — ABNORMAL LOW (ref 135–145)

## 2019-01-11 LAB — GLUCOSE, CAPILLARY: Glucose-Capillary: 99 mg/dL (ref 70–99)

## 2019-01-11 MED ORDER — SUCRALFATE 1 GM/10ML PO SUSP
1.0000 g | Freq: Three times a day (TID) | ORAL | Status: DC
  Administered 2019-01-11 – 2019-01-18 (×29): 1 g via ORAL
  Filled 2019-01-11 (×29): qty 10

## 2019-01-11 MED ORDER — HYDRALAZINE HCL 25 MG PO TABS
25.0000 mg | ORAL_TABLET | Freq: Three times a day (TID) | ORAL | Status: DC
  Administered 2019-01-11 – 2019-01-13 (×7): 25 mg via ORAL
  Filled 2019-01-11 (×7): qty 1

## 2019-01-11 NOTE — Progress Notes (Signed)
PROGRESS NOTE  Yolanda Flores [id number] DOB: 29-Jul-1985 DOA: 01/04/2019 PCP: Patient, No Pcp Per   LOS: 7 days   Brief Narrative / Interim history: 33 year old female with HTN, GERD, alcohol abuse who came into the hospital and was admitted on 01/04/2019 with abdominal pain, nausea and vomiting.  She was found to have acute pancreatitis in the setting of EtOH use as she has been drinking prior to admission.  She was also found to be in acute kidney injury, and gastroenterology and nephrology have been consulted  Subjective / 24h Interval events: She still complaining of reflux type discomfort but abdominal pain much better.  Also had an episode of nosebleed after blowing her nose around lunchtime  Assessment & Plan: Principal Problem Acute pancreatitis/SIRS -GI consulted and following. Possibly due to ETOH -she is clinically improving, continue to advance to soft diet -no cholelithiasis / CBD dilatation  Active Problems Acute kidney injury, likely ATN/elevated anion gap metabolic acidosis -Due to dehydration from pancreatitis, as well as ATN in the setting of episodic hypotension -Nephrology following, creatinine has remained quite elevated around 10 but overall stable  -Further management per nephrology, creatinine plateaued and hopefully will start to improve in the next day or 2  Reflux -Patient with significant burning sensation with swallowing food, she had been vomiting for at least couple of days prior to admission to the hospital, suspect a degree of esophagitis is present.  Supportive care  Leukocytosis -Patient's white count increased to 20,000 today.  She has no fevers, no cough/chest congestion, no sinus pressure, no shortness of breath, no abdominal pain, no diarrhea, no dysuria.  Possibly reactive, monitor  Epistaxis -No history of the same, unclear trigger.  Platelets are normal, -Resolved  Elevated LFTs  -In a pattern consistent with alcohol abuse,  improving  Alcohol abuse -will need complete cessation moving forward  Hypokalemia -monitor, caution with aggressive supplementation  Hyponatremia -Due to AKI/impaired free water excretion, monitor with diuresis  Scheduled Meds: . amLODipine  10 mg Oral Daily  . Chlorhexidine Gluconate Cloth  6 each Topical Daily  . folic acid  1 mg Oral Daily  . heparin  5,000 Units Subcutaneous Q8H  . multivitamin with minerals  1 tablet Oral Daily  . oxymetazoline  1 spray Each Nare BID  . pantoprazole  40 mg Oral BID  . thiamine  100 mg Oral Daily   Or  . thiamine  100 mg Intravenous Daily   Continuous Infusions: . sodium chloride 75 mL/hr at 01/11/19 0800   PRN Meds:.acetaminophen, alum & mag hydroxide-simeth **AND** lidocaine, bisacodyl, hydrALAZINE, labetalol, morphine injection, ondansetron **OR** ondansetron (ZOFRAN) IV, oxyCODONE, phenol, polyethylene glycol  DVT prophylaxis: Heparin Code Status: Full code Family Communication: No family at bedside, discussed with patient Disposition Plan: Home when cleared by nephrology and GI  Consultants:  GI Nephrology PCCM  Procedures:  None   Microbiology  SARS-CoV-2 12/22-negative  Antimicrobials: None     Objective: Vitals:   01/11/19 0400 01/11/19 0730 01/11/19 0800 01/11/19 0920  BP: (!) 148/91  (!) 150/99 (!) 161/116  Pulse: (!) 126  (!) 123 (!) 116  Resp: (!) 29  (!) 29 (!) 23  Temp:  98.8 F (37.1 C)    TempSrc:  Oral    SpO2: 97%  100% 100%  Weight:      Height:        Intake/Output Summary (Last 24 hours) at 01/11/2019 1031 Last data filed at 01/11/2019 0800 Gross per 24 hour  Intake 1310.2  ml  Output 1310 ml  Net 0.2 ml   Filed Weights   01/07/19 0500 01/09/19 0500 01/10/19 0500  Weight: 80 kg 82 kg 80.6 kg    Examination:  Constitutional: No distress Eyes: No icterus ENMT: Moist mucous membranes Neck: normal, supple Respiratory: Clear to auscultation, no wheezing or crackles  heard Cardiovascular: Regular rate and rhythm, tachycardic, no edema Abdomen: Soft, NT, ND, positive bowel sounds Musculoskeletal: no clubbing / cyanosis.  Skin: No new rashes Neurologic: Nonfocal, ambulatory Psychiatric: Normal judgment and insight. Alert and oriented x 3. Normal mood.    Data Reviewed: I have independently reviewed following labs and imaging studies   CBC: Recent Labs  Lab 01/05/19 0414 01/06/19 0854 01/07/19 0943 01/08/19 0832 01/09/19 0249 01/10/19 0302 01/11/19 0158  WBC 7.4 9.2 9.7 10.3 10.8* 15.1* 20.8*  NEUTROABS 6.5 8.0* 7.8* 6.4 6.4  --   --   HGB 12.4 10.1* 9.8* 10.1* 9.0* 8.4* 8.4*  HCT 37.6 28.9* 28.6* 28.5* 25.8* 23.6* 24.2*  MCV 99.2 95.4 95.7 92.8 92.5 92.2 92.0  PLT 176 146* 199 281 431* 566* 0000000*   Basic Metabolic Panel: Recent Labs  Lab 01/05/19 0414 01/07/19 0209 01/07/19 0943 01/07/19 1745 01/08/19 0832 01/08/19 1529 01/09/19 0249 01/10/19 0302 01/11/19 0158  NA 133*  --   --  134* 133* 131* 132*  132* 132* 134*  K 3.9  --   --  3.4* 3.0* 3.1* 2.9*  3.0* 3.2* 3.5  CL 105  --   --  96* 94* 96* 97*  97* 96* 98  CO2 13*  --   --  20* 20* 19* 17*  17* 16* 17*  GLUCOSE 106*  --   --  112* 111* 130* 97  96 89 107*  BUN 43*  --   --  70* 77* 78* 81*  82* 89* 90*  CREATININE 4.19*  --   --  8.87* 9.62* 10.07* 10.36*  10.33* 10.85* 10.23*  CALCIUM 5.9*  --   --  6.3* 6.3* 6.5* 6.8*  6.8* 7.2* 8.0*  MG 1.3*  --  1.8 2.1 2.2  --  2.1  --   --   PHOS 1.0*   < > <1.0* 2.8 2.3*  --  3.3  3.4 2.9 2.3*   < > = values in this interval not displayed.   Liver Function Tests: Recent Labs  Lab 01/05/19 0414 01/06/19 0854 01/07/19 0209 01/08/19 0832 01/09/19 0249 01/10/19 0302 01/11/19 0158  AST 538* 185* 115* 50* 32  --   --   ALT 160* 90* 71* 50* 37  --   --   ALKPHOS 85 96 98 102 88  --   --   BILITOT 1.5* 0.9 0.9 0.8 0.7  --   --   PROT 6.1* 5.6* 5.8* 6.3* 6.0*  --   --   ALBUMIN 3.0* 2.6* 2.6* 2.6* 2.6*  2.7* 2.9* 2.8*    Coagulation Profile: Recent Labs  Lab 01/10/19 1230  INR 1.0   HbA1C: No results for input(s): HGBA1C in the last 72 hours. CBG: Recent Labs  Lab 01/07/19 0727 01/08/19 0816 01/09/19 0736 01/10/19 0737 01/11/19 0746  GLUCAP 113* 112* 91 75 99    Recent Results (from the past 240 hour(s))  Respiratory Panel by RT PCR (Flu A&B, Covid) - Nasopharyngeal Swab     Status: None   Collection Time: 01/04/19  1:08 PM   Specimen: Nasopharyngeal Swab  Result Value Ref Range Status   SARS Coronavirus  2 by RT PCR NEGATIVE NEGATIVE Final    Comment: (NOTE) SARS-CoV-2 target nucleic acids are NOT DETECTED. The SARS-CoV-2 RNA is generally detectable in upper respiratoy specimens during the acute phase of infection. The lowest concentration of SARS-CoV-2 viral copies this assay can detect is 131 copies/mL. A negative result does not preclude SARS-Cov-2 infection and should not be used as the sole basis for treatment or other patient management decisions. A negative result may occur with  improper specimen collection/handling, submission of specimen other than nasopharyngeal swab, presence of viral mutation(s) within the areas targeted by this assay, and inadequate number of viral copies (<131 copies/mL). A negative result must be combined with clinical observations, patient history, and epidemiological information. The expected result is Negative. Fact Sheet for Patients:  PinkCheek.be Fact Sheet for Healthcare Providers:  GravelBags.it This test is not yet ap proved or cleared by the [place] FDA and  has been authorized for detection and/or diagnosis of SARS-CoV-2 by FDA under an Emergency Use Authorization (EUA). This EUA will remain  in effect (meaning this test can be used) for the duration of the COVID-19 declaration under Section 564(b)(1) of the Act, 21 U.S.C. section 360bbb-3(b)(1), unless the authorization is  terminated or revoked sooner.    Influenza A by PCR NEGATIVE NEGATIVE Final   Influenza B by PCR NEGATIVE NEGATIVE Final    Comment: (NOTE) The Xpert Xpress SARS-CoV-2/FLU/RSV assay is intended as an aid in  the diagnosis of influenza from Nasopharyngeal swab specimens and  should not be used as a sole basis for treatment. Nasal washings and  aspirates are unacceptable for Xpert Xpress SARS-CoV-2/FLU/RSV  testing. Fact Sheet for Patients: PinkCheek.be Fact Sheet for Healthcare Providers: GravelBags.it This test is not yet approved or cleared by the [place] FDA and  has been authorized for detection and/or diagnosis of SARS-CoV-2 by  FDA under an Emergency Use Authorization (EUA). This EUA will remain  in effect (meaning this test can be used) for the duration of the  Covid-19 declaration under Section 564(b)(1) of the Act, 21  U.S.C. section 360bbb-3(b)(1), unless the authorization is  terminated or revoked. Performed at [facility], [place] [address] [place], [place] [address]   MRSA PCR Screening     Status: None   Collection Time: 01/06/19  3:40 AM   Specimen: Nasal Mucosa; Nasopharyngeal  Result Value Ref Range Status   MRSA by PCR NEGATIVE NEGATIVE Final    Comment:        The GeneXpert MRSA Assay (FDA approved for NASAL specimens only), is one component of a comprehensive MRSA colonization surveillance program. It is not intended to diagnose MRSA infection nor to guide or monitor treatment for MRSA infections. Performed at [facility], [place] [address] [place], [place] [address]      Radiology Studies: No results found. Marzetta Board, MD, PhD [facility]  Between 7 am - 7 pm I am available, please contact me via Amion or Securechat  Between 7 pm - 7 am I am not available, please contact night coverage MD/APP via Amion

## 2019-01-11 NOTE — Progress Notes (Signed)
Patient ID: Yolanda Flores, female   DOB: [birth year], 33 y.o.   MRN: [id number]  [facility] Progress Note   Assessment/ Plan:   1. Acute kidney Injury: Appears to be consistent with ATN associated with SIRS from acute pancreatitis.  UOP 1160 yest, creat down slightly to 10.2. Baseline creat normal in May 2020.  Admit creat 4.1 on 12/23 and peak creat yest 10.85.  Still having heartburn but no uremic signs/ symtpoms.  Will cont IVF's at 75/hr.  2.  Acute pancreatitis with SIRS: Suspected to be from alcohol/biliary etiology.  Pantoprazole transitioned to oral route today by gastroenterology with advancement of diet. 3.  Hyponatremia: Secondary to acute kidney injury and impaired free water excretion.  Stable.  4.  Hypokalemia: a little better 5.  Anion gap metabolic acidosis: Secondary to acute kidney injury/starvation ketosis, monitor with labs without ongoing bicarbonate supplementation.  6.  Hypophosphatemia: Secondary to impaired intake, improved with supplementation 7.  HTN - will add hydralazine to norvasc  Kelly Splinter, MD 01/11/2019, 2:36 PM      Subjective:   Continues to c/o reflux / heartburn.  No nausea , emesis or confusion or lethargy noted.    Objective:   BP (!) 143/82   Pulse (!) 117   Temp 99.1 F (37.3 C) (Oral)   Resp (!) 23   Ht 5\' 7"  (1.702 m)   Wt 80.6 kg   LMP 12/14/2018 (Approximate)   SpO2 96%   BMI 27.83 kg/m   Physical Exam: Gen: Appears alert, O x3, pleasant. comfortable resting in bed, no distress CVS: Pulse regular tachycardia, S1 and S2 normal Resp: Diminished breath sounds over bases, no distinct rales or rhonchi Abd: Soft, tenderness over epigastric area Ext: no lower extremity edema, no asterixis.  Medications:    . amLODipine  10 mg Oral Daily  . Chlorhexidine Gluconate Cloth  6 each Topical Daily  . folic acid  1 mg Oral Daily  . heparin  5,000 Units Subcutaneous Q8H  . multivitamin with minerals  1 tablet Oral Daily   . oxymetazoline  1 spray Each Nare BID  . pantoprazole  40 mg Oral BID  . sucralfate  1 g Oral TID WC & HS  . thiamine  100 mg Oral Daily   Or  . thiamine  100 mg Intravenous Daily

## 2019-01-12 DIAGNOSIS — R12 Heartburn: Secondary | ICD-10-CM

## 2019-01-12 DIAGNOSIS — R1310 Dysphagia, unspecified: Secondary | ICD-10-CM

## 2019-01-12 LAB — CBC
HCT: 25.5 % — ABNORMAL LOW (ref 36.0–46.0)
Hemoglobin: 8.7 g/dL — ABNORMAL LOW (ref 12.0–15.0)
MCH: 31.8 pg (ref 26.0–34.0)
MCHC: 34.1 g/dL (ref 30.0–36.0)
MCV: 93.1 fL (ref 80.0–100.0)
Platelets: 838 10*3/uL — ABNORMAL HIGH (ref 150–400)
RBC: 2.74 MIL/uL — ABNORMAL LOW (ref 3.87–5.11)
RDW: 14.7 % (ref 11.5–15.5)
WBC: 20.2 10*3/uL — ABNORMAL HIGH (ref 4.0–10.5)
nRBC: 0 % (ref 0.0–0.2)

## 2019-01-12 LAB — RENAL FUNCTION PANEL
Albumin: 2.8 g/dL — ABNORMAL LOW (ref 3.5–5.0)
Anion gap: 14 (ref 5–15)
BUN: 80 mg/dL — ABNORMAL HIGH (ref 6–20)
CO2: 19 mmol/L — ABNORMAL LOW (ref 22–32)
Calcium: 8.6 mg/dL — ABNORMAL LOW (ref 8.9–10.3)
Chloride: 101 mmol/L (ref 98–111)
Creatinine, Ser: 7.4 mg/dL — ABNORMAL HIGH (ref 0.44–1.00)
GFR calc Af Amer: 8 mL/min — ABNORMAL LOW (ref >60)
GFR calc non Af Amer: 7 mL/min — ABNORMAL LOW (ref >60)
Glucose, Bld: 99 mg/dL (ref 70–99)
Phosphorus: 3.2 mg/dL (ref 2.5–4.6)
Potassium: 3.2 mmol/L — ABNORMAL LOW (ref 3.5–5.1)
Sodium: 134 mmol/L — ABNORMAL LOW (ref 135–145)

## 2019-01-12 LAB — GLUCOSE, CAPILLARY: Glucose-Capillary: 85 mg/dL (ref 70–99)

## 2019-01-12 MED ORDER — LIDOCAINE VISCOUS HCL 2 % MT SOLN
15.0000 mL | Freq: Four times a day (QID) | OROMUCOSAL | Status: DC
  Administered 2019-01-12 – 2019-01-18 (×23): 15 mL via ORAL
  Filled 2019-01-12 (×23): qty 15

## 2019-01-12 MED ORDER — ALUM & MAG HYDROXIDE-SIMETH 200-200-20 MG/5ML PO SUSP
30.0000 mL | Freq: Four times a day (QID) | ORAL | Status: DC
  Administered 2019-01-12 – 2019-01-18 (×23): 30 mL via ORAL
  Filled 2019-01-12 (×23): qty 30

## 2019-01-12 MED ORDER — POTASSIUM CHLORIDE 20 MEQ/15ML (10%) PO SOLN
40.0000 meq | Freq: Once | ORAL | Status: DC
  Filled 2019-01-12: qty 30

## 2019-01-12 MED ORDER — FLUCONAZOLE 40 MG/ML PO SUSR
100.0000 mg | Freq: Every day | ORAL | Status: DC
  Administered 2019-01-12 – 2019-01-18 (×7): 100 mg via ORAL
  Filled 2019-01-12 (×7): qty 2.5

## 2019-01-12 MED ORDER — LIDOCAINE VISCOUS HCL 2 % MT SOLN
15.0000 mL | Freq: Once | OROMUCOSAL | Status: AC
  Administered 2019-01-12: 11:00:00 15 mL via ORAL
  Filled 2019-01-12: qty 15

## 2019-01-12 MED ORDER — ALUM & MAG HYDROXIDE-SIMETH 200-200-20 MG/5ML PO SUSP
30.0000 mL | Freq: Once | ORAL | Status: AC
  Administered 2019-01-12: 11:00:00 30 mL via ORAL
  Filled 2019-01-12: qty 30

## 2019-01-12 NOTE — Progress Notes (Signed)
PROGRESS NOTE  Yolanda Flores [id number] DOB: 1985/11/19 DOA: 01/04/2019 PCP: Patient, No Pcp Per   LOS: 8 days   Brief Narrative / Interim history: 33 year old female with HTN, GERD, alcohol abuse who came into the hospital and was admitted on 01/04/2019 with abdominal pain, nausea and vomiting.  She was found to have acute pancreatitis in the setting of EtOH use as she has been drinking prior to admission.  She was admitted to stepdown and critical care was consulted.  She was also found to have renal failure, and gastroenterology and nephrology have been consulted.  Clinically improving, her creatinine was as high as 10 but now getting better.  Diet was advanced and she is tolerating eating however has persistent reflux type symptoms.  Discharge timing will need to be coordinated with nephrology.  Subjective / 24h Interval events: Appreciates ongoing chest burning with food swallowing, somewhat improved after Carafate yesterday.  No fever or chills, no chest pain, no chest congestion.  No abdominal pain, no nausea or vomiting.  Has been ambulating in the room  Assessment & Plan: Principal Problem Acute pancreatitis/SIRS -GI consulted and following. Possibly due to ETOH -she is clinically improving, from pancreatitis standpoint she is tolerating a soft diet however has difficulties with swallowing/odynophagia -no cholelithiasis / CBD dilatation  Active Problems Acute kidney injury, likely ATN/elevated anion gap metabolic acidosis -Due to dehydration from pancreatitis, as well as ATN in the setting of episodic hypotension -Nephrology following, creatinine has remained quite elevated around 10 for several days but today improving into the 7 range.  Unclear at this point whether she will remain with a degree of chronic kidney disease  Odynophagia/severe burning pain with swallowing -Patient with significant burning sensation with swallowing food, she had been vomiting for at least  couple of days prior to admission to the hospital, suspect a degree of esophagitis is present. -Have added Carafate yesterday -d/w Dr. Silverio Decamp yesterday, will add Diflucan that and see if that helps  Leukocytosis / thrombocytosis -Patient's white count increased to 20,000 on 12/29 and similar values on 12/30.  She has no fevers, no cough/chest congestion, no sinus pressure, no shortness of breath, no abdominal pain, no diarrhea, no dysuria.  Possibly reactive, monitor, Diflucan added as above-monitor WBC tomorrow morning  Epistaxis -One episode 12/28, no history of the same, unclear trigger.  Platelets are normal, -Resolved, no further epistaxis  Elevated LFTs  -In a pattern consistent with alcohol abuse, improving  Alcohol abuse -will need complete cessation moving forward  Hypokalemia -Persistent, replete somewhat cautious due to renal failure  Hyponatremia -Due to AKI/impaired free water excretion, monitor, overall has remained stable  Scheduled Meds: . amLODipine  10 mg Oral Daily  . Chlorhexidine Gluconate Cloth  6 each Topical Daily  . folic acid  1 mg Oral Daily  . heparin  5,000 Units Subcutaneous Q8H  . hydrALAZINE  25 mg Oral Q8H  . multivitamin with minerals  1 tablet Oral Daily  . oxymetazoline  1 spray Each Nare BID  . pantoprazole  40 mg Oral BID  . potassium chloride  40 mEq Oral Once  . sucralfate  1 g Oral TID WC & HS  . thiamine  100 mg Oral Daily   Or  . thiamine  100 mg Intravenous Daily   Continuous Infusions: . sodium chloride 75 mL/hr at 01/12/19 1005   PRN Meds:.acetaminophen, alum & mag hydroxide-simeth **AND** lidocaine, bisacodyl, hydrALAZINE, labetalol, morphine injection, ondansetron **OR** ondansetron (ZOFRAN) IV, oxyCODONE, phenol, polyethylene glycol  DVT prophylaxis: Heparin Code Status: Full code Family Communication: No family at bedside, discussed with patient Disposition Plan: Home when cleared by nephrology and GI  Consultants:    GI Nephrology PCCM  Procedures:  None   Microbiology  SARS-CoV-2 12/22-negative  Antimicrobials: None     Objective: Vitals:   01/12/19 0235 01/12/19 0400 01/12/19 0600 01/12/19 0800  BP:  (!) 164/91 (!) 154/96 (!) 148/86  Pulse:  (!) 108 (!) 112 (!) 113  Resp:   (!) 23 11  Temp:  98.2 F (36.8 C)  99.3 F (37.4 C)  TempSrc:  Oral  Oral  SpO2:  97% 98% 99%  Weight: 79.7 kg     Height:        Intake/Output Summary (Last 24 hours) at 01/12/2019 1012 Last data filed at 01/12/2019 1005 Gross per 24 hour  Intake 2390.7 ml  Output 1750 ml  Net 640.7 ml   Filed Weights   01/09/19 0500 01/10/19 0500 01/12/19 0235  Weight: 82 kg 80.6 kg 79.7 kg    Examination:  Constitutional: No acute distress, in bed Eyes: No scleral icterus seen ENMT: Moist mucous membranes Neck: normal, supple Respiratory: Clear to auscultation bilaterally without wheezing or crackles Cardiovascular: Regular rate and rhythm, no murmurs, no peripheral edema Abdomen: Soft, nontender, nondistended, positive bowel sounds Musculoskeletal: no clubbing / cyanosis.  Skin: No new rashes Neurologic: Nonfocal, ambulatory Psychiatric: Normal judgment and insight. Alert and oriented x 3. Normal mood.    Data Reviewed: I have independently reviewed following labs and imaging studies   CBC: Recent Labs  Lab 01/06/19 0854 01/07/19 0943 01/08/19 0832 01/09/19 0249 01/10/19 0302 01/11/19 0158 01/12/19 0219  WBC 9.2 9.7 10.3 10.8* 15.1* 20.8* 20.2*  NEUTROABS 8.0* 7.8* 6.4 6.4  --   --   --   HGB 10.1* 9.8* 10.1* 9.0* 8.4* 8.4* 8.7*  HCT 28.9* 28.6* 28.5* 25.8* 23.6* 24.2* 25.5*  MCV 95.4 95.7 92.8 92.5 92.2 92.0 93.1  PLT 146* 199 281 431* 566* 692* 0000000*   Basic Metabolic Panel: Recent Labs  Lab 01/07/19 0209 01/07/19 0943 01/07/19 1745 01/08/19 0832 01/08/19 1529 01/09/19 0249 01/10/19 0302 01/11/19 0158 01/12/19 0219  NA  --   --  134* 133* 131* 132*  132* 132* 134* 134*  K  --    --  3.4* 3.0* 3.1* 2.9*  3.0* 3.2* 3.5 3.2*  CL  --   --  96* 94* 96* 97*  97* 96* 98 101  CO2  --   --  20* 20* 19* 17*  17* 16* 17* 19*  GLUCOSE  --   --  112* 111* 130* 97  96 89 107* 99  BUN  --   --  70* 77* 78* 81*  82* 89* 90* 80*  CREATININE  --   --  8.87* 9.62* 10.07* 10.36*  10.33* 10.85* 10.23* 7.40*  CALCIUM  --   --  6.3* 6.3* 6.5* 6.8*  6.8* 7.2* 8.0* 8.6*  MG  --  1.8 2.1 2.2  --  2.1  --   --   --   PHOS   < > <1.0* 2.8 2.3*  --  3.3  3.4 2.9 2.3* 3.2   < > = values in this interval not displayed.   Liver Function Tests: Recent Labs  Lab 01/06/19 0854 01/07/19 0209 01/08/19 0832 01/09/19 0249 01/10/19 0302 01/11/19 0158 01/12/19 0219  AST 185* 115* 50* 32  --   --   --   ALT  90* 71* 50* 37  --   --   --   ALKPHOS 96 98 102 88  --   --   --   BILITOT 0.9 0.9 0.8 0.7  --   --   --   PROT 5.6* 5.8* 6.3* 6.0*  --   --   --   ALBUMIN 2.6* 2.6* 2.6* 2.6*  2.7* 2.9* 2.8* 2.8*   Coagulation Profile: Recent Labs  Lab 01/10/19 1230  INR 1.0   HbA1C: No results for input(s): HGBA1C in the last 72 hours. CBG: Recent Labs  Lab 01/08/19 0816 01/09/19 0736 01/10/19 0737 01/11/19 0746 01/12/19 0753  GLUCAP 112* 91 75 99 85    Recent Results (from the past 240 hour(s))  Respiratory Panel by RT PCR (Flu A&B, Covid) - Nasopharyngeal Swab     Status: None   Collection Time: 01/04/19  1:08 PM   Specimen: Nasopharyngeal Swab  Result Value Ref Range Status   SARS Coronavirus 2 by RT PCR NEGATIVE NEGATIVE Final    Comment: (NOTE) SARS-CoV-2 target nucleic acids are NOT DETECTED. The SARS-CoV-2 RNA is generally detectable in upper respiratoy specimens during the acute phase of infection. The lowest concentration of SARS-CoV-2 viral copies this assay can detect is 131 copies/mL. A negative result does not preclude SARS-Cov-2 infection and should not be used as the sole basis for treatment or other patient management decisions. A negative result may occur  with  improper specimen collection/handling, submission of specimen other than nasopharyngeal swab, presence of viral mutation(s) within the areas targeted by this assay, and inadequate number of viral copies (<131 copies/mL). A negative result must be combined with clinical observations, patient history, and epidemiological information. The expected result is Negative. Fact Sheet for Patients:  PinkCheek.be Fact Sheet for Healthcare Providers:  GravelBags.it This test is not yet ap proved or cleared by the [place] FDA and  has been authorized for detection and/or diagnosis of SARS-CoV-2 by FDA under an Emergency Use Authorization (EUA). This EUA will remain  in effect (meaning this test can be used) for the duration of the COVID-19 declaration under Section 564(b)(1) of the Act, 21 U.S.C. section 360bbb-3(b)(1), unless the authorization is terminated or revoked sooner.    Influenza A by PCR NEGATIVE NEGATIVE Final   Influenza B by PCR NEGATIVE NEGATIVE Final    Comment: (NOTE) The Xpert Xpress SARS-CoV-2/FLU/RSV assay is intended as an aid in  the diagnosis of influenza from Nasopharyngeal swab specimens and  should not be used as a sole basis for treatment. Nasal washings and  aspirates are unacceptable for Xpert Xpress SARS-CoV-2/FLU/RSV  testing. Fact Sheet for Patients: PinkCheek.be Fact Sheet for Healthcare Providers: GravelBags.it This test is not yet approved or cleared by the [place] FDA and  has been authorized for detection and/or diagnosis of SARS-CoV-2 by  FDA under an Emergency Use Authorization (EUA). This EUA will remain  in effect (meaning this test can be used) for the duration of the  Covid-19 declaration under Section 564(b)(1) of the Act, 21  U.S.C. section 360bbb-3(b)(1), unless the authorization is  terminated or revoked. Performed  at [facility], [place] [address], [place], [place] [address]   MRSA PCR Screening     Status: None   Collection Time: 01/06/19  3:40 AM   Specimen: Nasal Mucosa; Nasopharyngeal  Result Value Ref Range Status   MRSA by PCR NEGATIVE NEGATIVE Final    Comment:        The GeneXpert  MRSA Assay (FDA approved for NASAL specimens only), is one component of a comprehensive MRSA colonization surveillance program. It is not intended to diagnose MRSA infection nor to guide or monitor treatment for MRSA infections. Performed at [facility], [place] [address] [place], [place] [address]      Radiology Studies: No results found. Marzetta Board, MD, PhD [facility]  Between 7 am - 7 pm I am available, please contact me via Amion or Securechat  Between 7 pm - 7 am I am not available, please contact night coverage MD/APP via Amion

## 2019-01-12 NOTE — Progress Notes (Addendum)
[facility] Progress Note  CC:  Acute pancreatitis  Subjective:  Feeling better.  Tolerating some solid food.  GI called back because of increasing WBC count.  Her main complaint is of heartburn/odynophagia.  Had a normal brown BM about 30 minutes ago.  Objective:  Vital signs in last 24 hours: Temp:  [98.2 F (36.8 C)-99.3 F (37.4 C)] 99.3 F (37.4 C) (12/30 0800) Pulse Rate:  [107-118] 113 (12/30 0800) Resp:  [11-25] 11 (12/30 0800) BP: (139-164)/(63-100) 148/86 (12/30 0800) SpO2:  [94 %-99 %] 99 % (12/30 0800) Weight:  [79.7 kg] 79.7 kg (12/30 0235) Last BM Date: 01/12/19 General:  Alert, Well-developed, in NAD Heart:  Slightly tachy; no murmurs Pulm:  CTAB.  No increased WOB. Abdomen:  Soft, non-distended.  BS present.  Mild diffuse TTP. Extremities:  Without edema. Neurologic:  Alert and oriented x 4;  grossly normal neurologically. Psych:  Alert and cooperative. Normal mood and affect.  Intake/Output from previous day: 12/29 0701 - 12/30 0700 In: 2159.4 [P.O.:480; I.V.:1679.4] Out: 1900 [Urine:1900] Intake/Output this shift: Total I/O In: 306.3 [I.V.:306.3] Out: 1 [Stool:1]  Lab Results: Recent Labs    01/10/19 0302 01/11/19 0158 01/12/19 0219  WBC 15.1* 20.8* 20.2*  HGB 8.4* 8.4* 8.7*  HCT 23.6* 24.2* 25.5*  PLT 566* 692* 838*   BMET Recent Labs    01/10/19 0302 01/11/19 0158 01/12/19 0219  NA 132* 134* 134*  K 3.2* 3.5 3.2*  CL 96* 98 101  CO2 16* 17* 19*  GLUCOSE 89 107* 99  BUN 89* 90* 80*  CREATININE 10.85* 10.23* 7.40*  CALCIUM 7.2* 8.0* 8.6*   LFT Recent Labs    01/12/19 0219  ALBUMIN 2.8*   PT/INR Recent Labs    01/10/19 1230  LABPROT 13.5  INR 1.0   Assessment / Plan: 1. Acute pancreatitis secondary to alcohol use with SIRS. She is still somewhat tachy this AM. Afebrile. WBC increased to 20K now.  Normal LFTs.  RUQ sono 12/22 without evidence of gallstones or biliary duct dilatation. Abd MRI wo contrast   12/23 without evidence of pancreatic pseudocyst or necrosis.  -We do not think that her leukocytosis is coming from her pancreatitis as she seems to be improving/progessing well with that.  Did have some questionable left lower lobe consolidation on chest x-ray previously.  2. Reflux symptoms, dysphagia/odynophagia:  This is her primary complaint at this point.  Likely has erosive esophagitis from significant vomiting prior to hospitalization. -Continue pantoprazole 40 mg IV BID. -Continue carafate suspension four times daily. -Will add GI cocktail with lidocaine every 6 hours back again. -Looks like fluconazole suspension was ordered as well, which is ok, but not sure that this is a yeast/candida issue.   3. AKI secondary to ATN + SIRS/acute pancreatitis. Cr improving. -nephrology following   4. Normocytic anemia:  Hgb stable.  No sign of GI bleeding.   LOS: 8 days   Laban Emperor. Zehr  01/12/2019, 11:15 AM   Attending physician's note   I have taken an interval history, reviewed the chart and examined the patient. I agree with the Advanced Practitioner's note, impression and recommendations.   Acute pancreatitis likely etiology secondary to alcohol, is improving clinically Based on MRI last week no evidence of necrosis or pseudocyst, less likely to have developed necrosis now given overall her symptoms are better and she denies any abdominal pain Will need to exclude other etiology or infectious source for leukocytosis, consider repeat UA and chest x-ray.  She had atelectasis/?  Haziness in left lower lobe of lung.  Heartburn and odynophagia likely etiology severe erosive esophagitis Continue Carafate, PPI and GI cocktail as needed Okay to empirically treat with fluconazole suspension for 3 days. Advance diet as tolerated  AKI: Secondary to ATN creatinine is improving  Raliegh Ip Denzil Magnuson , MD [phone]

## 2019-01-12 NOTE — Progress Notes (Signed)
Patient ID: Yolanda Flores, female   DOB: 07/01/1985, 33 y.o.   MRN: [id number] [facility] Progress Note   Assessment/ Plan:   1. Acute kidney Injury: Appears to be consistent with ATN associated with SIRS from acute pancreatitis.  Baseline creat normal in May 2020.  Admit creat 4.1 on 12/23, peak creat 10.8, down to 10 yesterday and 7.0 today.  Sig improvement. Cont IVF's 75/hr.   2.  Acute pancreatitis with SIRS: Suspected to be from alcohol/biliary etiology.  Pantoprazole transitioned to oral route today by gastroenterology with advancement of diet. 3.  Hyponatremia: Secondary to acute kidney injury and impaired free water excretion.  Stable.  4.  Hypokalemia: a little better 5.  Anion gap metabolic acidosis: better 6.  Hypophosphatemia: Secondary to impaired intake, improved with supplementation 7.  HTN - added hydralazine to norvasc, titrate as needed  Kelly Splinter, MD 01/12/2019, 1:19 PM      Subjective:   Continues to c/o reflux / heartburn.  No nausea. Eating some. Creat down 7.0     BP (!) 154/103   Pulse (!) 118   Temp 99 F (37.2 C) (Oral)   Resp 11   Ht 5\' 7"  (1.702 m)   Wt 79.7 kg   LMP 12/14/2018 (Approximate)   SpO2 98%   BMI 27.52 kg/m   Physical Exam: Gen: Appears alert, O x3, pleasant. comfortable resting in bed, no distress CVS: Pulse regular tachycardia, S1 and S2 normal Resp: Diminished breath sounds over bases, no distinct rales or rhonchi Abd: Soft, tenderness over epigastric area Ext: no lower extremity edema, no asterixis.  Medications:    . alum & mag hydroxide-simeth  30 mL Oral Q6H   And  . lidocaine  15 mL Oral Q6H  . amLODipine  10 mg Oral Daily  . Chlorhexidine Gluconate Cloth  6 each Topical Daily  . fluconazole  100 mg Oral Daily  . folic acid  1 mg Oral Daily  . heparin  5,000 Units Subcutaneous Q8H  . hydrALAZINE  25 mg Oral Q8H  . multivitamin with minerals  1 tablet Oral Daily  . oxymetazoline  1 spray Each  Nare BID  . pantoprazole  40 mg Oral BID  . potassium chloride  40 mEq Oral Once  . sucralfate  1 g Oral TID WC & HS  . thiamine  100 mg Oral Daily   Or  . thiamine  100 mg Intravenous Daily

## 2019-01-12 NOTE — TOC Progression Note (Signed)
Transition of Care [facility]) - Progression Note    Patient Details  Name: Yolanda Flores MRN: [id number] Date of Birth: [birth year]  [facility]) CM/SW Contact  Servando Snare, [place] Phone Number: 01/12/2019, 10:04 AM  Clinical Narrative:   [facility] consulted for SA resources. LCSW added residential resources to patient AVS. Outpatient resources left on patient chart.          Expected Discharge Plan and Services                                                 Social Determinants of Health (SDOH) Interventions    Readmission Risk Interventions No flowsheet data found.

## 2019-01-13 ENCOUNTER — Inpatient Hospital Stay (HOSPITAL_COMMUNITY): Payer: BLUE CROSS/BLUE SHIELD

## 2019-01-13 DIAGNOSIS — R0602 Shortness of breath: Secondary | ICD-10-CM

## 2019-01-13 LAB — RENAL FUNCTION PANEL
Albumin: 2.8 g/dL — ABNORMAL LOW (ref 3.5–5.0)
Anion gap: 16 — ABNORMAL HIGH (ref 5–15)
BUN: 66 mg/dL — ABNORMAL HIGH (ref 6–20)
CO2: 18 mmol/L — ABNORMAL LOW (ref 22–32)
Calcium: 8.7 mg/dL — ABNORMAL LOW (ref 8.9–10.3)
Chloride: 101 mmol/L (ref 98–111)
Creatinine, Ser: 3.72 mg/dL — ABNORMAL HIGH (ref 0.44–1.00)
GFR calc Af Amer: 18 mL/min — ABNORMAL LOW (ref >60)
GFR calc non Af Amer: 15 mL/min — ABNORMAL LOW (ref >60)
Glucose, Bld: 91 mg/dL (ref 70–99)
Phosphorus: 3.3 mg/dL (ref 2.5–4.6)
Potassium: 3.2 mmol/L — ABNORMAL LOW (ref 3.5–5.1)
Sodium: 135 mmol/L (ref 135–145)

## 2019-01-13 LAB — GLUCOSE, CAPILLARY: Glucose-Capillary: 84 mg/dL (ref 70–99)

## 2019-01-13 LAB — CBC
HCT: 24.8 % — ABNORMAL LOW (ref 36.0–46.0)
Hemoglobin: 8.4 g/dL — ABNORMAL LOW (ref 12.0–15.0)
MCH: 31.9 pg (ref 26.0–34.0)
MCHC: 33.9 g/dL (ref 30.0–36.0)
MCV: 94.3 fL (ref 80.0–100.0)
Platelets: 896 10*3/uL — ABNORMAL HIGH (ref 150–400)
RBC: 2.63 MIL/uL — ABNORMAL LOW (ref 3.87–5.11)
RDW: 15 % (ref 11.5–15.5)
WBC: 21.9 10*3/uL — ABNORMAL HIGH (ref 4.0–10.5)
nRBC: 0 % (ref 0.0–0.2)

## 2019-01-13 LAB — MAGNESIUM: Magnesium: 1.4 mg/dL — ABNORMAL LOW (ref 1.7–2.4)

## 2019-01-13 MED ORDER — FUROSEMIDE 10 MG/ML IJ SOLN
20.0000 mg | Freq: Once | INTRAMUSCULAR | Status: AC
  Administered 2019-01-13: 09:00:00 20 mg via INTRAVENOUS
  Filled 2019-01-13: qty 2

## 2019-01-13 MED ORDER — IOHEXOL 9 MG/ML PO SOLN
ORAL | Status: AC
  Administered 2019-01-13: 11:00:00 500 mL via ORAL
  Filled 2019-01-13: qty 1000

## 2019-01-13 MED ORDER — POTASSIUM CHLORIDE CRYS ER 20 MEQ PO TBCR
40.0000 meq | EXTENDED_RELEASE_TABLET | Freq: Once | ORAL | Status: AC
  Administered 2019-01-13: 15:00:00 40 meq via ORAL
  Filled 2019-01-13 (×2): qty 2

## 2019-01-13 MED ORDER — POTASSIUM CHLORIDE 10 MEQ/100ML IV SOLN: 10.0000 meq | INTRAVENOUS | Status: AC

## 2019-01-13 MED ORDER — HYDRALAZINE HCL 10 MG PO TABS
10.0000 mg | ORAL_TABLET | Freq: Three times a day (TID) | ORAL | Status: DC
  Administered 2019-01-13 – 2019-01-18 (×15): 10 mg via ORAL
  Filled 2019-01-13 (×16): qty 1

## 2019-01-13 MED ORDER — POTASSIUM CHLORIDE CRYS ER 20 MEQ PO TBCR
40.0000 meq | EXTENDED_RELEASE_TABLET | Freq: Once | ORAL | Status: AC
  Administered 2019-01-13: 09:00:00 40 meq via ORAL

## 2019-01-13 MED ORDER — MAGNESIUM SULFATE 2 GM/50ML IV SOLN
2.0000 g | Freq: Once | INTRAVENOUS | Status: AC
  Administered 2019-01-13: 07:00:00 2 g via INTRAVENOUS
  Filled 2019-01-13: qty 50

## 2019-01-13 MED ORDER — IOHEXOL 9 MG/ML PO SOLN
500.0000 mL | ORAL | Status: AC
  Administered 2019-01-13: 12:00:00 500 mL via ORAL

## 2019-01-13 NOTE — Progress Notes (Addendum)
     [facility] Progress Note  CC:  Acute pancreatitis, leukocytosis  Subjective:  WBC count still trending up.  Overall abdomen feels better.  Swallowing is a little better with current regimen, but still complains of "heartburn" and feels full/could not eat much.  Persistently tachy.  Objective:  Vital signs in last 24 hours: Temp:  [97.9 F (36.6 C)-99 F (37.2 C)] 98.8 F (37.1 C) (12/31 0341) Pulse Rate:  [106-118] 116 (12/31 0410) Resp:  [17-25] 22 (12/31 0410) BP: (128-159)/(61-107) 159/94 (12/31 0830) SpO2:  [96 %-99 %] 99 % (12/31 0410) Last BM Date: 01/12/19 General:  Alert, Well-developed, in NAD Heart:  Tachy. Abdomen:  Soft, mildly distended.  Non-tender. Extremities:  Without edema. Neurologic:  Alert and oriented x 4;  grossly normal neurologically. Psych:  Alert and cooperative. Normal mood and affect.  Intake/Output from previous day: 12/30 0701 - 12/31 0700 In: 2927.6 [I.V.:1827.6] Out: 1751 [Urine:1750; Stool:1] Intake/Output this shift: Total I/O In: 81.4 [I.V.:37.6; IV Piggyback:43.9] Out: 230 [Urine:230]  Lab Results: Recent Labs    01/11/19 0158 01/12/19 0219 01/13/19 0223  WBC 20.8* 20.2* 21.9*  HGB 8.4* 8.7* 8.4*  HCT 24.2* 25.5* 24.8*  PLT 692* 838* 896*   BMET Recent Labs    01/11/19 0158 01/12/19 0219 01/13/19 0223  NA 134* 134* 135  K 3.5 3.2* 3.2*  CL 98 101 101  CO2 17* 19* 18*  GLUCOSE 107* 99 91  BUN 90* 80* 66*  CREATININE 10.23* 7.40* 3.72*  CALCIUM 8.0* 8.6* 8.7*   LFT Recent Labs    01/13/19 0223  ALBUMIN 2.8*   PT/INR Recent Labs    01/10/19 1230  LABPROT 13.5  INR 1.0   Assessment / Plan: 1. Acute pancreatitissecondary to alcohol usewith SIRS. She is still somewhat tachy this AM. Afebrile. WBC increased to 21K now, continues to increase.  Normal LFTs. RUQ sono 12/22 without evidence of gallstones or biliary duct dilatation. Abd MRI wo contrast 12/23 without evidence of pancreatic  pseudocyst or necrosis.  -We are not sure that her leukocytosis is coming from her pancreatitis as she seems to be improving/progessing well with that, but will plan for CT scan abdomen and pelvis without contrast to be sure no new develops.  Also, previously had ascites so if pancreas stable and moderate ascites still present then may want to consider diagnostic paracentesis.  2. Reflux symptoms, dysphagia/odynophagia:  This is her primary complaint at this point.  Likely has erosive esophagitis from significant vomiting prior to hospitalization.  Slightly improved today. -Continue pantoprazole 40 mg IV BID. -Continue carafate suspension four times daily. -Continue GI cocktail with lidocaine every 6 hours. -Looks like fluconazole suspension was ordered as well, which is ok x 3 days, but not sure that this is a yeast/candida issue.   3. AKI secondary to ATN + SIRS/acute pancreatitis. Cr improving. -nephrology following  4. Normocytic anemia:  Hgb stable.  No sign of GI bleeding.   LOS: 9 days   Laban Emperor. Zehr  01/13/2019, 9:02 AM   Attending physician's note   I have taken an interval history, reviewed the chart and examined the patient. I agree with the Advanced Practitioner's note, impression and recommendations.   Persistent leukocytosis Will obtain CT abdomen without contrast to exclude pancreatic necrosis or new pseudocyst   Heartburn, slowly improving: Continue current regimen and antireflux measures  AKI improving  K. Denzil Magnuson , MD [phone]

## 2019-01-13 NOTE — Progress Notes (Addendum)
PROGRESS NOTE  Yolanda Flores [id number] DOB: [birth year] DOA: 01/04/2019 PCP: Patient, No Pcp Per   LOS: 9 days   Brief Narrative / Interim history: 33 year old female with HTN, GERD, alcohol abuse who came into the hospital and was admitted on 01/04/2019 with abdominal pain, nausea and vomiting.  She was found to have acute pancreatitis in the setting of EtOH use as she has been drinking prior to admission.  She was admitted to stepdown and critical care was consulted.  She was also found to have renal failure, and gastroenterology and nephrology have been consulted.  Clinically improving, her creatinine was as high as 10 but now getting better.  Diet was advanced and she is tolerating eating however has persistent reflux type symptoms.  -Continues to have worsening sinus tachycardia and leukocytosis  Subjective / 24h Interval events: -Complains of severe odynophagia and reflux, no fever, also complains of dyspnea  Assessment & Plan:   Acute alcoholic pancreatitis  -Slow improvement clinically, treated with supportive care, bowel rest, fluids, antiemetics etc.  -Now with worsening tachycardia and leukocytosis  -Repeat CT abdomen pelvis, if this does not explain her worsening leukocytosis will need sampling of her ascitic fluid which is moderate on initial CT to rule out SBP -Also remove Foley catheter -Gastroenterology following, prior imaging without choledocholithiasis -Continue soft diet, stop IV fluids today she has bibasilar crackles  Acute kidney injury, likely ATN/elevated anion gap metabolic acidosis -Secondary to severe pancreatitis  -Improving, creatinine down to 3 , will stop IV fluids today given bibasilar crackles, creatinine peaked at 10   Odynophagia/severe burning pain  -Continue PPI, Carafate -Diflucan  Epistaxis -One episode 12/28, no history of the same, unclear trigger.  Platelets are normal, -Resolved, no further epistaxis  Elevated LFTs  -In a  pattern consistent with alcohol abuse, improving  Alcohol abuse -Counseled, continue thiamine  Hypokalemia -Persistent, replete   Hyponatremia -Improving  DVT prophylaxis: Heparin Code Status: Full code Family Communication: No family at bedside, discussed with patient Disposition Plan: Home when cleared by nephrology and GI  Consultants:  GI Nephrology PCCM  Procedures:  None   Microbiology  SARS-CoV-2 12/22-negative  Antimicrobials: None    Scheduled Meds: . alum & mag hydroxide-simeth  30 mL Oral Q6H   And  . lidocaine  15 mL Oral Q6H  . amLODipine  10 mg Oral Daily  . Chlorhexidine Gluconate Cloth  6 each Topical Daily  . fluconazole  100 mg Oral Daily  . folic acid  1 mg Oral Daily  . heparin  5,000 Units Subcutaneous Q8H  . hydrALAZINE  25 mg Oral Q8H  . iohexol  500 mL Oral Q1H  . multivitamin with minerals  1 tablet Oral Daily  . oxymetazoline  1 spray Each Nare BID  . pantoprazole  40 mg Oral BID  . potassium chloride  40 mEq Oral Once  . sucralfate  1 g Oral TID WC & HS  . thiamine  100 mg Oral Daily   Or  . thiamine  100 mg Intravenous Daily   Continuous Infusions:  PRN Meds:.bisacodyl, hydrALAZINE, labetalol, morphine injection, ondansetron **OR** ondansetron (ZOFRAN) IV, oxyCODONE, phenol, polyethylene glycol    Objective: Vitals:   01/13/19 0700 01/13/19 0800 01/13/19 0830 01/13/19 0915  BP:   (!) 159/94 (!) 149/92  Pulse: (!) 127 (!) 114  (!) 121  Resp: 20 (!) 25  (!) 22  Temp:  99.9 F (37.7 C)    TempSrc:  Oral    SpO2: 100%  97%  98%  Weight:      Height:        Intake/Output Summary (Last 24 hours) at 01/13/2019 1042 Last data filed at 01/13/2019 0900 Gross per 24 hour  Intake 2702.68 ml  Output 2110 ml  Net 592.68 ml   Filed Weights   01/09/19 0500 01/10/19 0500 01/12/19 0235  Weight: 82 kg 80.6 kg 79.7 kg    Examination:  Gen: Awake, Alert, Oriented X 3, no distress HEENT: PERRLA, Neck supple, no JVD Lungs: Fine  bibasilar crackles CVS: RRR,No Gallops,Rubs or new Murmurs Abd: Soft, mild epigastric tenderness, fluid thrill, bowel sounds present Extremities: Trace edema Skin: no new rashes    Data Reviewed: I have independently reviewed following labs and imaging studies   CBC: Recent Labs  Lab 01/07/19 0943 01/08/19 0832 01/09/19 0249 01/10/19 0302 01/11/19 0158 01/12/19 0219 01/13/19 0223  WBC 9.7 10.3 10.8* 15.1* 20.8* 20.2* 21.9*  NEUTROABS 7.8* 6.4 6.4  --   --   --   --   HGB 9.8* 10.1* 9.0* 8.4* 8.4* 8.7* 8.4*  HCT 28.6* 28.5* 25.8* 23.6* 24.2* 25.5* 24.8*  MCV 95.7 92.8 92.5 92.2 92.0 93.1 94.3  PLT 199 281 431* 566* 692* 838* 0000000*   Basic Metabolic Panel: Recent Labs  Lab 01/07/19 0209 01/07/19 0943 01/07/19 1745 01/08/19 0832 01/09/19 0249 01/10/19 0302 01/11/19 0158 01/12/19 0219 01/13/19 0223  NA  --   --  134* 133* 132*  132* 132* 134* 134* 135  K  --   --  3.4* 3.0* 2.9*  3.0* 3.2* 3.5 3.2* 3.2*  CL  --   --  96* 94* 97*  97* 96* 98 101 101  CO2  --   --  20* 20* 17*  17* 16* 17* 19* 18*  GLUCOSE  --   --  112* 111* 97  96 89 107* 99 91  BUN  --   --  70* 77* 81*  82* 89* 90* 80* 66*  CREATININE  --   --  8.87* 9.62* 10.36*  10.33* 10.85* 10.23* 7.40* 3.72*  CALCIUM  --   --  6.3* 6.3* 6.8*  6.8* 7.2* 8.0* 8.6* 8.7*  MG  --  1.8 2.1 2.2 2.1  --   --   --  1.4*  PHOS   < > <1.0* 2.8 2.3* 3.3  3.4 2.9 2.3* 3.2 3.3   < > = values in this interval not displayed.   Liver Function Tests: Recent Labs  Lab 01/07/19 0209 01/08/19 [id number] 01/09/19 0249 01/10/19 0302 01/11/19 0158 01/12/19 0219 01/13/19 0223  AST 115* 50* 32  --   --   --   --   ALT 71* 50* 37  --   --   --   --   ALKPHOS 98 102 88  --   --   --   --   BILITOT 0.9 0.8 0.7  --   --   --   --   PROT 5.8* 6.3* 6.0*  --   --   --   --   ALBUMIN 2.6* 2.6* 2.6*  2.7* 2.9* 2.8* 2.8* 2.8*   Coagulation Profile: Recent Labs  Lab 01/10/19 1230  INR 1.0   HbA1C: No results for input(s):  HGBA1C in the last 72 hours. CBG: Recent Labs  Lab 01/08/19 0816 01/09/19 0736 01/10/19 0737 01/11/19 0746 01/12/19 0753  GLUCAP 112* 91 75 99 85    Recent Results (from the past 240 hour(s))  Respiratory Panel by RT PCR (Flu A&B, Covid) - Nasopharyngeal Swab     Status: None   Collection Time: 01/04/19  1:08 PM   Specimen: Nasopharyngeal Swab  Result Value Ref Range Status   SARS Coronavirus 2 by RT PCR NEGATIVE NEGATIVE Final    Comment: (NOTE) SARS-CoV-2 target nucleic acids are NOT DETECTED. The SARS-CoV-2 RNA is generally detectable in upper respiratoy specimens during the acute phase of infection. The lowest concentration of SARS-CoV-2 viral copies this assay can detect is 131 copies/mL. A negative result does not preclude SARS-Cov-2 infection and should not be used as the sole basis for treatment or other patient management decisions. A negative result may occur with  improper specimen collection/handling, submission of specimen other than nasopharyngeal swab, presence of viral mutation(s) within the areas targeted by this assay, and inadequate number of viral copies (<131 copies/mL). A negative result must be combined with clinical observations, patient history, and epidemiological information. The expected result is Negative. Fact Sheet for Patients:  PinkCheek.be Fact Sheet for Healthcare Providers:  GravelBags.it This test is not yet ap proved or cleared by the [place] FDA and  has been authorized for detection and/or diagnosis of SARS-CoV-2 by FDA under an Emergency Use Authorization (EUA). This EUA will remain  in effect (meaning this test can be used) for the duration of the COVID-19 declaration under Section 564(b)(1) of the Act, 21 U.S.C. section 360bbb-3(b)(1), unless the authorization is terminated or revoked sooner.    Influenza A by PCR NEGATIVE NEGATIVE Final   Influenza B by PCR NEGATIVE  NEGATIVE Final    Comment: (NOTE) The Xpert Xpress SARS-CoV-2/FLU/RSV assay is intended as an aid in  the diagnosis of influenza from Nasopharyngeal swab specimens and  should not be used as a sole basis for treatment. Nasal washings and  aspirates are unacceptable for Xpert Xpress SARS-CoV-2/FLU/RSV  testing. Fact Sheet for Patients: PinkCheek.be Fact Sheet for Healthcare Providers: GravelBags.it This test is not yet approved or cleared by the [place] FDA and  has been authorized for detection and/or diagnosis of SARS-CoV-2 by  FDA under an Emergency Use Authorization (EUA). This EUA will remain  in effect (meaning this test can be used) for the duration of the  Covid-19 declaration under Section 564(b)(1) of the Act, 21  U.S.C. section 360bbb-3(b)(1), unless the authorization is  terminated or revoked. Performed at [facility], [place] [address] [place] [address], [place], [place] [address]   MRSA PCR Screening     Status: None   Collection Time: 01/06/19  3:40 AM   Specimen: Nasal Mucosa; Nasopharyngeal  Result Value Ref Range Status   MRSA by PCR NEGATIVE NEGATIVE Final    Comment:        The GeneXpert MRSA Assay (FDA approved for NASAL specimens only), is one component of a comprehensive MRSA colonization surveillance program. It is not intended to diagnose MRSA infection nor to guide or monitor treatment for MRSA infections. Performed at [facility], [place] [address] [place], [place] [address]      Radiology Studies: DG CHEST PORT 1 VIEW  Result Date: 01/13/2019 CLINICAL DATA:  Dyspnea EXAM: PORTABLE CHEST 1 VIEW COMPARISON:  01/09/2019 FINDINGS: Persistent left lower lobe consolidation appears slightly improved. Small left effusion Right lung remains clear.  Negative for heart failure or edema. IMPRESSION: Left lower lobe consolidation with slight interval improvement. Electronically  Signed   By: Franchot Gallo M.D.   On: 01/13/2019 09:19   Domenic Polite, MD [facility]

## 2019-01-13 NOTE — Progress Notes (Signed)
Patient ID: Yolanda Flores, female   DOB: 1985/07/11, 33 y.o.   MRN: [id number] [facility] Progress Note   Assessment/ Plan:   1. Acute kidney Injury: ATN associated with SIRS from acute pancreatitis.  Baseline creat normal in May 2020.  Admit creat 4.1 on 12/23, peaked at 10.8 and is down to 3 today.  Excellent recovery from AKI.  Will lower IVF to 50/hr and can wean off per primary team.  Will have pt f/u in office in 4-6 wks. Will sign off.  2.  Acute pancreatitis with SIRS: Suspected to be from alcohol/biliary etiology.  Pantoprazole transitioned to oral route  by gastroenterology with advancement of diet. 3.  Hyponatremia: Secondary to acute kidney injury , resolved 4.  Hypokalemia: a little better 5.  Anion gap metabolic acidosis: better 6.  Hypophosphatemia: Secondary to impaired intake, improved with supplementation 7.  HTN - added hydralazine to norvasc, titrate as needed  Kelly Splinter, MD 01/13/2019, 2:09 PM      Subjective:   Creat down to 3 today. No new c/o.      BP (!) 153/102 (BP Location: Left Arm)   Pulse (!) 122   Temp 98.5 F (36.9 C) (Oral)   Resp (!) 26   Ht 5\' 7"  (1.702 m)   Wt 79.7 kg   LMP 12/14/2018 (Approximate)   SpO2 97%   BMI 27.52 kg/m   Physical Exam: Gen: Appears alert, O x3, pleasant. comfortable resting in bed, no distress CVS: Pulse regular tachycardia, S1 and S2 normal Resp: Diminished breath sounds over bases, no distinct rales or rhonchi Abd: soft ntnd no ascites Ext: no lower extremity edema, no asterixis.  Medications:    . alum & mag hydroxide-simeth  30 mL Oral Q6H   And  . lidocaine  15 mL Oral Q6H  . amLODipine  10 mg Oral Daily  . Chlorhexidine Gluconate Cloth  6 each Topical Daily  . fluconazole  100 mg Oral Daily  . folic acid  1 mg Oral Daily  . heparin  5,000 Units Subcutaneous Q8H  . hydrALAZINE  25 mg Oral Q8H  . multivitamin with minerals  1 tablet Oral Daily  . oxymetazoline  1 spray Each Nare  BID  . pantoprazole  40 mg Oral BID  . potassium chloride  40 mEq Oral Once  . sucralfate  1 g Oral TID WC & HS  . thiamine  100 mg Oral Daily   Or  . thiamine  100 mg Intravenous Daily

## 2019-01-14 LAB — COMPREHENSIVE METABOLIC PANEL
ALT: 18 U/L (ref 0–44)
AST: 17 U/L (ref 15–41)
Albumin: 2.9 g/dL — ABNORMAL LOW (ref 3.5–5.0)
Alkaline Phosphatase: 79 U/L (ref 38–126)
Anion gap: 11 (ref 5–15)
BUN: 46 mg/dL — ABNORMAL HIGH (ref 6–20)
CO2: 21 mmol/L — ABNORMAL LOW (ref 22–32)
Calcium: 8.8 mg/dL — ABNORMAL LOW (ref 8.9–10.3)
Chloride: 106 mmol/L (ref 98–111)
Creatinine, Ser: 1.99 mg/dL — ABNORMAL HIGH (ref 0.44–1.00)
GFR calc Af Amer: 38 mL/min — ABNORMAL LOW (ref >60)
GFR calc non Af Amer: 33 mL/min — ABNORMAL LOW (ref >60)
Glucose, Bld: 93 mg/dL (ref 70–99)
Potassium: 3.5 mmol/L (ref 3.5–5.1)
Sodium: 138 mmol/L (ref 135–145)
Total Bilirubin: 1.1 mg/dL (ref 0.3–1.2)
Total Protein: 6.4 g/dL — ABNORMAL LOW (ref 6.5–8.1)

## 2019-01-14 LAB — CBC
HCT: 24.4 % — ABNORMAL LOW (ref 36.0–46.0)
Hemoglobin: 8.3 g/dL — ABNORMAL LOW (ref 12.0–15.0)
MCH: 33.1 pg (ref 26.0–34.0)
MCHC: 34 g/dL (ref 30.0–36.0)
MCV: 97.2 fL (ref 80.0–100.0)
Platelets: 758 10*3/uL — ABNORMAL HIGH (ref 150–400)
RBC: 2.51 MIL/uL — ABNORMAL LOW (ref 3.87–5.11)
RDW: 14.8 % (ref 11.5–15.5)
WBC: 18.4 10*3/uL — ABNORMAL HIGH (ref 4.0–10.5)
nRBC: 0 % (ref 0.0–0.2)

## 2019-01-14 LAB — LIPASE, BLOOD: Lipase: 74 U/L — ABNORMAL HIGH (ref 11–51)

## 2019-01-14 LAB — GLUCOSE, CAPILLARY: Glucose-Capillary: 93 mg/dL (ref 70–99)

## 2019-01-14 LAB — TRIGLYCERIDES: Triglycerides: 274 mg/dL — ABNORMAL HIGH (ref <150)

## 2019-01-14 LAB — C-REACTIVE PROTEIN: CRP: 7.9 mg/dL — ABNORMAL HIGH (ref <1.0)

## 2019-01-14 MED ORDER — FUROSEMIDE 10 MG/ML IJ SOLN
20.0000 mg | Freq: Once | INTRAMUSCULAR | Status: AC
  Administered 2019-01-14: 20 mg via INTRAVENOUS
  Filled 2019-01-14: qty 2

## 2019-01-14 NOTE — Progress Notes (Addendum)
[facility] Progress Note  CC:  Acute pancreatitis   Subjective: She reported sleeping fairly well last night. No abdominal pain but she is using the heating pad to her abdomen "for comfort". No n/v. She is tolerating water and ginger ale. Less acid reflux burning pain when swallowing liquids. Pills no longer get stuck in her esophagus. She passed 2 soft light brown stools last night. No melena or rectal bleeding. She is ambulating up to bathroom without difficulty.   Objective:  Vital signs in last 24 hours: Temp:  [98 F (36.7 C)-100.2 F (37.9 C)] 98 F (36.7 C) (01/01 0800) Pulse Rate:  [103-128] 117 (01/01 0800) Resp:  [12-31] 17 (01/01 0800) BP: (145-156)/(56-107) 156/107 (01/01 0800) SpO2:  [95 %-100 %] 98 % (01/01 0800) Weight:  [75.4 kg] 75.4 kg (01/01 0500) Last BM Date: 01/14/19 General:   Alert,  Well-developed in NAD. Eyes: Sclera non icteric. Conjuctiva pink.  Heart: Tachycardic, no murmur or rub. Pulm:  Breath sounds clear throughout.  Abdomen: Soft, nondistended, nontender, hypoactive BS x 4 quads. No HSM. Extremities:  Without edema. Neurologic:  Alert and  oriented x4;  grossly normal neurologically. Psych:  Alert and cooperative. Normal mood and affect.  Intake/Output from previous day: 12/31 0701 - 01/01 0700 In: 801.4 [P.O.:720; I.V.:37.6; IV Piggyback:43.9] Out: 975 [Urine:975] Intake/Output this shift: No intake/output data recorded.  Lab Results: Recent Labs    01/12/19 0219 01/13/19 0223 01/14/19 0155  WBC 20.2* 21.9* 18.4*  HGB 8.7* 8.4* 8.3*  HCT 25.5* 24.8* 24.4*  PLT 838* 896* 758*   BMET Recent Labs    01/12/19 0219 01/13/19 0223 01/14/19 0155  NA 134* 135 138  K 3.2* 3.2* 3.5  CL 101 101 106  CO2 19* 18* 21*  GLUCOSE 99 91 93  BUN 80* 66* 46*  CREATININE 7.40* 3.72* 1.99*  CALCIUM 8.6* 8.7* 8.8*   LFT Recent Labs    01/14/19 0155  PROT 6.4*  ALBUMIN 2.9*  AST 17  ALT 18  ALKPHOS 79  BILITOT 1.1    PT/INR No results for input(s): LABPROT, INR in the last 72 hours. Hepatitis Panel No results for input(s): HEPBSAG, HCVAB, HEPAIGM, HEPBIGM in the last 72 hours.  CT ABDOMEN PELVIS WO CONTRAST  Result Date: 01/13/2019 CLINICAL DATA:  Abdominal pain, fever and elevated white count EXAM: CT ABDOMEN AND PELVIS WITHOUT CONTRAST TECHNIQUE: Multidetector CT imaging of the abdomen and pelvis was performed following the standard protocol without IV contrast. COMPARISON:  MRCP 01/05/2019 FINDINGS: Lower chest: Small to moderate bilateral pleural effusions identified, right greater than left. Hepatobiliary: No focal liver abnormality is seen. No gallstones, gallbladder wall thickening, or biliary dilatation. Pancreas: Persistent and progressive changes of diffuse pancreatitis noted. Secondary to lack of IV contrast material the pancreas is difficult to identified separate from surrounding upper abdominal inflammatory changes. Fat stranding, phlegmon and free fluid in the upper abdomen appears increased from previous exam. Spleen: Normal in size without focal abnormality. Adrenals/Urinary Tract: Normal appearance of the adrenal glands. Kidneys are unremarkable. Urinary bladder appears normal. Stomach/Bowel: The stomach is nondistended. Inflammatory changes associated with pancreatitis have mass effect upon the posterior wall of the gastric antrum. No significant bowel wall thickening, inflammation or distension. The appendix is visualized and appears normal. Vascular/Lymphatic: Normal appearance of the abdominal aorta. No abdominopelvic adenopathy identified. Reproductive: Uterus and bilateral adnexa are unremarkable. Other: Free fluid is noted extending along the pericolic gutters and into the pelvis. Within the limitations of unenhanced  technique no discrete fluid collection identified. Musculoskeletal: No acute or significant osseous findings. IMPRESSION: 1. Persistent and progressive changes of acute  pancreatitis. Evaluation for pseudocyst or pancreatic necrosis is limited due to lack of intravenous contrast material. 2. Phlegmon formation/inflammation within the upper abdomen demonstrates mass effect upon the posterior wall of the gastric antrum. 3. Small to moderate bilateral pleural effusions, left greater than right. Electronically Signed   By: Kerby Moors M.D.   On: 01/13/2019 13:39   DG CHEST PORT 1 VIEW  Result Date: 01/13/2019 CLINICAL DATA:  Dyspnea EXAM: PORTABLE CHEST 1 VIEW COMPARISON:  01/09/2019 FINDINGS: Persistent left lower lobe consolidation appears slightly improved. Small left effusion Right lung remains clear.  Negative for heart failure or edema. IMPRESSION: Left lower lobe consolidation with slight interval improvement. Electronically Signed   By: Franchot Gallo M.D.   On: 01/13/2019 09:19    Assessment / Plan:  27.  34 year old female with acute pancreatitis secondary to alcohol use with SIRS.  Right upper quadrant sono 12/22 without evidence of gallstones or biliary duct dilatation.  Abdominal MRI without contrast 12/23 without evidence of a pancreatic pseudocyst or necrosis.  An abdominal/pelvic CT without contrast 12/31 showed persistent and progressive changes of acute pancreatitis, evaluation for pseudocyst or pancreatic necrosis was limited due to lack of IV contrast.  Phlegmon formation/inflammation within the upper abdomen with mass affect on the posterior wall of the gastric antrum.  Leukocytosis slightly improved with WBC 18.4 down from 21.9. She is not on antibiotics.  -clear liquids, ? advance diet - IgG4 to rule out autoimmune pancreatitis -Triglyceride level to rule out alcohol induced hypertriglyceridemia pancreatitis (note normal triglyceride level of 77 on  05/2018) -Dr. Silverio Decamp to review CTAP results, await her input  2.  GERD, most likely has erosive esophagitis from vomiting prior to hospitalization. -Continue GI cocktail every 6 hours -Continue  Pantoprazole 40 mg p.o. twice daily -Consider EGD as outpatient  3.  Admission LFTs were elevated most likely due to shock liver.  Acute hepatitis panel was negative.  LFTs remain normal.  AST 17.  ALT 18.  4.  AKI secondary to ATN + SIRS/acute pancreatitis.  Creatinine improving.  Creatinine 1.99 down from 3.72. -Neurology following  5.  Normocytic anemia. Hemoglobin 8.3.  Hematocrit 24.4.  MCV 97.2.  Iron 27.  TIBC 233.  Ferritin 593 on 12/25. No signs of active GI bleeding. -check cbc daily    Active Problems:   HTN (hypertension)   Lactic acidosis   Alcohol abuse   Abnormal LFTs   Acute gallstone pancreatitis   Pancreatitis   Acute renal failure (HCC)   Shock circulatory (HCC)   Heartburn   Odynophagia     LOS: 10 days   Noralyn Pick  01/14/2019, 8:59 AM   Attending physician's note   I have taken an interval history, reviewed the chart and examined the patient. I agree with the Advanced Practitioner's note, impression and recommendations.   CT abdomen noncontrast with findings of persistent acute pancreatitis, no definitive signs of necrosis or abscess Leukocytosis is improving She appears to be clinically improving slowly Will hold off antibiotics given no clear indication at this point  Slowly advance diet as tolerated, small frequent meals, avoid high-fat diet Discussed alcohol cessation  AKI improving  We will arrange for office follow-up in 4 to 6 weeks  GI will follow as needed, and available if have any questions  K. Denzil Magnuson , MD [phone]

## 2019-01-14 NOTE — Progress Notes (Signed)
PROGRESS NOTE  Yolanda Flores [id number] DOB: 06-04-1985 DOA: 01/04/2019 PCP: Patient, No Pcp Per   LOS: 10 days   Brief Narrative / Interim history: 34 year old female with HTN, GERD, alcohol abuse who came into the hospital and was admitted on 01/04/2019 with abdominal pain, nausea and vomiting.  She was found to have acute pancreatitis in the setting of EtOH use as she has been drinking prior to admission.  She was admitted to stepdown and critical care was consulted.  She was also found to have renal failure, and gastroenterology and nephrology have been consulted.  Clinically improving, her creatinine was as high as 10 but now getting better.  Diet was advanced and she is tolerating eating however has persistent reflux type symptoms.  -Continues to have worsening sinus tachycardia and leukocytosis  Subjective / 24h Interval events: -Breathing a little better after IV Lasix yesterday, mild abdominal discomfort, no nausea or vomiting, tolerating liquids  Assessment & Plan:  Acute alcoholic pancreatitis  -Slow improvement clinically, treated with supportive care, bowel rest, fluids, antiemetics etc, subsequently developed worsening tachycardia and leukocytosis -Repeat CT 12/31 limited by lack of IV contrast but concern for phlegmon formation/inflammation with mass-effect on posterior wall of gastric antrum. -Gastroenterology following, will defer to them regarding need for IV antibiotics -White count is little better down to 18 K today -Continue liquid diet, transfer out of ICU -Fluids stopped yesterday due to fluid overload, bilateral pleural effusions  Acute kidney injury, likely ATN/elevated anion gap metabolic acidosis -Secondary to severe pancreatitis  -Improving, creatinine down to 1.9 , creatinine had peaked on 10 this admission -Fluids discontinued, IV Lasix 20 mg x 1 today due to fluid overload/pleural effusions  Odynophagia/severe burning pain  -Continue PPI,  Carafate -Diflucan  Epistaxis - resolved, single episode 12/28  Elevated LFTs  -Likely from alcoholic hepatitis, improved  Alcohol abuse -Counseled, continue thiamine  Hypokalemia -Persistent, replete   Hyponatremia -Improving  DVT prophylaxis: Heparin Code Status: Full code Family Communication: No family at bedside, discussed with patient Disposition Plan: Transfer out of stepdown  Consultants:  GI Nephrology PCCM  Procedures:  None   Microbiology  SARS-CoV-2 12/22-negative  Antimicrobials: None    Scheduled Meds: . alum & mag hydroxide-simeth  30 mL Oral Q6H   And  . lidocaine  15 mL Oral Q6H  . amLODipine  10 mg Oral Daily  . Chlorhexidine Gluconate Cloth  6 each Topical Daily  . fluconazole  100 mg Oral Daily  . folic acid  1 mg Oral Daily  . heparin  5,000 Units Subcutaneous Q8H  . hydrALAZINE  10 mg Oral Q8H  . multivitamin with minerals  1 tablet Oral Daily  . oxymetazoline  1 spray Each Nare BID  . pantoprazole  40 mg Oral BID  . sucralfate  1 g Oral TID WC & HS  . thiamine  100 mg Oral Daily   Or  . thiamine  100 mg Intravenous Daily   Continuous Infusions:  PRN Meds:.bisacodyl, hydrALAZINE, labetalol, morphine injection, ondansetron **OR** ondansetron (ZOFRAN) IV, oxyCODONE, phenol, polyethylene glycol    Objective: Vitals:   01/14/19 0600 01/14/19 0700 01/14/19 0800 01/14/19 0900  BP: (!) 148/98 (!) 145/96 (!) 156/107 128/78  Pulse: (!) 103 (!) 104 (!) 117 98  Resp: (!) 23 18 17  (!) 23  Temp:   98 F (36.7 C)   TempSrc:      SpO2: 96% 95% 98% 100%  Weight:      Height:  Intake/Output Summary (Last 24 hours) at 01/14/2019 1058 Last data filed at 01/14/2019 0330 Gross per 24 hour  Intake 720 ml  Output 300 ml  Net 420 ml   Filed Weights   01/10/19 0500 01/12/19 0235 01/14/19 0500  Weight: 80.6 kg 79.7 kg 75.4 kg    Examination:  Gen: Awake alert oriented x3, no distress  HEENT: Pupils equal reactive, oral mucosa  moist  CVS S1-S2 regular rhythm, tachycardic  Lungs, fine bibasilar crackles noted  Abdomen is soft, mild epigastric tenderness, bowel sounds present, fluid thrill  Extremities no edema  Skin: no new rashes    Data Reviewed: I have independently reviewed following labs and imaging studies   CBC: Recent Labs  Lab 01/08/19 0832 01/09/19 0249 01/10/19 0302 01/11/19 0158 01/12/19 0219 01/13/19 0223 01/14/19 0155  WBC 10.3 10.8* 15.1* 20.8* 20.2* 21.9* 18.4*  NEUTROABS 6.4 6.4  --   --   --   --   --   HGB 10.1* 9.0* 8.4* 8.4* 8.7* 8.4* 8.3*  HCT 28.5* 25.8* 23.6* 24.2* 25.5* 24.8* 24.4*  MCV 92.8 92.5 92.2 92.0 93.1 94.3 97.2  PLT 281 431* 566* 692* 838* 896* 123456*   Basic Metabolic Panel: Recent Labs  Lab 01/07/19 1745 01/08/19 0832 01/09/19 0249 01/10/19 0302 01/11/19 0158 01/12/19 0219 01/13/19 0223 01/14/19 0155  NA 134* 133* 132*  132* 132* 134* 134* 135 138  K 3.4* 3.0* 2.9*  3.0* 3.2* 3.5 3.2* 3.2* 3.5  CL 96* 94* 97*  97* 96* 98 101 101 106  CO2 20* 20* 17*  17* 16* 17* 19* 18* 21*  GLUCOSE 112* 111* 97  96 89 107* 99 91 93  BUN 70* 77* 81*  82* 89* 90* 80* 66* 46*  CREATININE 8.87* 9.62* 10.36*  10.33* 10.85* 10.23* 7.40* 3.72* 1.99*  CALCIUM 6.3* 6.3* 6.8*  6.8* 7.2* 8.0* 8.6* 8.7* 8.8*  MG 2.1 2.2 2.1  --   --   --  1.4*  --   PHOS 2.8 2.3* 3.3  3.4 2.9 2.3* 3.2 3.3  --    Liver Function Tests: Recent Labs  Lab 01/08/19 0832 01/09/19 0249 01/10/19 0302 01/11/19 0158 01/12/19 0219 01/13/19 0223 01/14/19 0155  AST 50* 32  --   --   --   --  17  ALT 50* 37  --   --   --   --  18  ALKPHOS 102 88  --   --   --   --  79  BILITOT 0.8 0.7  --   --   --   --  1.1  PROT 6.3* 6.0*  --   --   --   --  6.4*  ALBUMIN 2.6* 2.6*  2.7* 2.9* 2.8* 2.8* 2.8* 2.9*   Coagulation Profile: Recent Labs  Lab 01/10/19 1230  INR 1.0   HbA1C: No results for input(s): HGBA1C in the last 72 hours. CBG: Recent Labs  Lab 01/09/19 0736 01/10/19 0737  01/11/19 0746 01/12/19 0753 01/13/19 0811  GLUCAP 91 75 99 85 84    Recent Results (from the past 240 hour(s))  Respiratory Panel by RT PCR (Flu A&B, Covid) - Nasopharyngeal Swab     Status: None   Collection Time: 01/04/19  1:08 PM   Specimen: Nasopharyngeal Swab  Result Value Ref Range Status   SARS Coronavirus 2 by RT PCR NEGATIVE NEGATIVE Final    Comment: (NOTE) SARS-CoV-2 target nucleic acids are NOT DETECTED. The SARS-CoV-2 RNA is generally detectable  in upper respiratoy specimens during the acute phase of infection. The lowest concentration of SARS-CoV-2 viral copies this assay can detect is 131 copies/mL. A negative result does not preclude SARS-Cov-2 infection and should not be used as the sole basis for treatment or other patient management decisions. A negative result may occur with  improper specimen collection/handling, submission of specimen other than nasopharyngeal swab, presence of viral mutation(s) within the areas targeted by this assay, and inadequate number of viral copies (<131 copies/mL). A negative result must be combined with clinical observations, patient history, and epidemiological information. The expected result is Negative. Fact Sheet for Patients:  PinkCheek.be Fact Sheet for Healthcare Providers:  GravelBags.it This test is not yet ap proved or cleared by the [place] FDA and  has been authorized for detection and/or diagnosis of SARS-CoV-2 by FDA under an Emergency Use Authorization (EUA). This EUA will remain  in effect (meaning this test can be used) for the duration of the COVID-19 declaration under Section 564(b)(1) of the Act, 21 U.S.C. section 360bbb-3(b)(1), unless the authorization is terminated or revoked sooner.    Influenza A by PCR NEGATIVE NEGATIVE Final   Influenza B by PCR NEGATIVE NEGATIVE Final    Comment: (NOTE) The Xpert Xpress SARS-CoV-2/FLU/RSV assay is  intended as an aid in  the diagnosis of influenza from Nasopharyngeal swab specimens and  should not be used as a sole basis for treatment. Nasal washings and  aspirates are unacceptable for Xpert Xpress SARS-CoV-2/FLU/RSV  testing. Fact Sheet for Patients: PinkCheek.be Fact Sheet for Healthcare Providers: GravelBags.it This test is not yet approved or cleared by the [place] FDA and  has been authorized for detection and/or diagnosis of SARS-CoV-2 by  FDA under an Emergency Use Authorization (EUA). This EUA will remain  in effect (meaning this test can be used) for the duration of the  Covid-19 declaration under Section 564(b)(1) of the Act, 21  U.S.C. section 360bbb-3(b)(1), unless the authorization is  terminated or revoked. Performed at [facility], [place] [address], [place], [place] [address]   MRSA PCR Screening     Status: None   Collection Time: 01/06/19  3:40 AM   Specimen: Nasal Mucosa; Nasopharyngeal  Result Value Ref Range Status   MRSA by PCR NEGATIVE NEGATIVE Final    Comment:        The GeneXpert MRSA Assay (FDA approved for NASAL specimens only), is one component of a comprehensive MRSA colonization surveillance program. It is not intended to diagnose MRSA infection nor to guide or monitor treatment for MRSA infections. Performed at [facility], [place] [address] [place], [place] [address]      Radiology Studies: CT ABDOMEN PELVIS WO CONTRAST  Result Date: 01/13/2019 CLINICAL DATA:  Abdominal pain, fever and elevated white count EXAM: CT ABDOMEN AND PELVIS WITHOUT CONTRAST TECHNIQUE: Multidetector CT imaging of the abdomen and pelvis was performed following the standard protocol without IV contrast. COMPARISON:  MRCP 01/05/2019 FINDINGS: Lower chest: Small to moderate bilateral pleural effusions identified, right greater than left. Hepatobiliary: No focal liver  abnormality is seen. No gallstones, gallbladder wall thickening, or biliary dilatation. Pancreas: Persistent and progressive changes of diffuse pancreatitis noted. Secondary to lack of IV contrast material the pancreas is difficult to identified separate from surrounding upper abdominal inflammatory changes. Fat stranding, phlegmon and free fluid in the upper abdomen appears increased from previous exam. Spleen: Normal in size without focal abnormality. Adrenals/Urinary Tract: Normal appearance of the adrenal glands. Kidneys are  unremarkable. Urinary bladder appears normal. Stomach/Bowel: The stomach is nondistended. Inflammatory changes associated with pancreatitis have mass effect upon the posterior wall of the gastric antrum. No significant bowel wall thickening, inflammation or distension. The appendix is visualized and appears normal. Vascular/Lymphatic: Normal appearance of the abdominal aorta. No abdominopelvic adenopathy identified. Reproductive: Uterus and bilateral adnexa are unremarkable. Other: Free fluid is noted extending along the pericolic gutters and into the pelvis. Within the limitations of unenhanced technique no discrete fluid collection identified. Musculoskeletal: No acute or significant osseous findings. IMPRESSION: 1. Persistent and progressive changes of acute pancreatitis. Evaluation for pseudocyst or pancreatic necrosis is limited due to lack of intravenous contrast material. 2. Phlegmon formation/inflammation within the upper abdomen demonstrates mass effect upon the posterior wall of the gastric antrum. 3. Small to moderate bilateral pleural effusions, left greater than right. Electronically Signed   By: Kerby Moors M.D.   On: 01/13/2019 13:39   Domenic Polite, MD [facility]

## 2019-01-14 NOTE — Progress Notes (Signed)
Patient to transfer to 5E 1519 report given to receiving nurse Raven, all questions answered at this time.  Pt. VSS with no s/s of distress noted.

## 2019-01-15 LAB — COMPREHENSIVE METABOLIC PANEL
ALT: 15 U/L (ref 0–44)
AST: 17 U/L (ref 15–41)
Albumin: 3.1 g/dL — ABNORMAL LOW (ref 3.5–5.0)
Alkaline Phosphatase: 89 U/L (ref 38–126)
Anion gap: 14 (ref 5–15)
BUN: 26 mg/dL — ABNORMAL HIGH (ref 6–20)
CO2: 24 mmol/L (ref 22–32)
Calcium: 8.8 mg/dL — ABNORMAL LOW (ref 8.9–10.3)
Chloride: 100 mmol/L (ref 98–111)
Creatinine, Ser: 1.25 mg/dL — ABNORMAL HIGH (ref 0.44–1.00)
GFR calc Af Amer: >60 mL/min (ref >60)
GFR calc non Af Amer: 57 mL/min — ABNORMAL LOW (ref >60)
Glucose, Bld: 93 mg/dL (ref 70–99)
Potassium: 3.1 mmol/L — ABNORMAL LOW (ref 3.5–5.1)
Sodium: 138 mmol/L (ref 135–145)
Total Bilirubin: 0.8 mg/dL (ref 0.3–1.2)
Total Protein: 7.1 g/dL (ref 6.5–8.1)

## 2019-01-15 LAB — CBC
HCT: 26.8 % — ABNORMAL LOW (ref 36.0–46.0)
Hemoglobin: 9.1 g/dL — ABNORMAL LOW (ref 12.0–15.0)
MCH: 32.2 pg (ref 26.0–34.0)
MCHC: 34 g/dL (ref 30.0–36.0)
MCV: 94.7 fL (ref 80.0–100.0)
Platelets: 775 10*3/uL — ABNORMAL HIGH (ref 150–400)
RBC: 2.83 MIL/uL — ABNORMAL LOW (ref 3.87–5.11)
RDW: 14.8 % (ref 11.5–15.5)
WBC: 20.5 10*3/uL — ABNORMAL HIGH (ref 4.0–10.5)
nRBC: 0 % (ref 0.0–0.2)

## 2019-01-15 MED ORDER — POTASSIUM CHLORIDE CRYS ER 20 MEQ PO TBCR
40.0000 meq | EXTENDED_RELEASE_TABLET | Freq: Once | ORAL | Status: AC
  Administered 2019-01-15: 40 meq via ORAL
  Filled 2019-01-15: qty 2

## 2019-01-15 MED ORDER — ACETAMINOPHEN 325 MG PO TABS
650.0000 mg | ORAL_TABLET | Freq: Four times a day (QID) | ORAL | Status: DC | PRN
  Administered 2019-01-15: 650 mg via ORAL
  Filled 2019-01-15: qty 2

## 2019-01-15 MED ORDER — MORPHINE SULFATE (PF) 2 MG/ML IV SOLN
2.0000 mg | INTRAVENOUS | Status: DC | PRN
  Administered 2019-01-15: 2 mg via INTRAVENOUS
  Filled 2019-01-15: qty 1

## 2019-01-15 NOTE — Progress Notes (Signed)
PROGRESS NOTE  Yolanda Flores [id number] DOB: [birth year] DOA: 01/04/2019 PCP: Patient, No Pcp Per   LOS: 11 days   Brief Narrative / Interim history: 34 year old female with HTN, GERD, alcohol abuse who came into the hospital and was admitted on 01/04/2019 with severe acute alcoholic pancreatitis, complicated by severe ATN with creatinine of 10, followed by gastroenterology and nephrology, slowly improved with supportive care  -Last 3 days she has had worsening leukocytosis and low-grade fever , repeat CT was concerning for phlegmon formation, this was a noncontrast scan, no evidence of necrosis or pseudocyst noted, GI was reconsulted -Also noted to have fluid overload and bilateral pleural effusions, requiring diuresis for the last few days  Subjective / 24h Interval events: -Started having left-sided abdominal discomfort yesterday, denies any nausea, odynophagia is improved, dyspnea is also improving  Assessment & Plan:  Acute alcoholic pancreatitis  -Slow improvement clinically, treated with supportive care, bowel rest, fluids, antiemetics etc, subsequently developed worsening tachycardia and leukocytosis -GI was reconsulted this week -Repeat CT 12/31 limited by lack of IV contrast but concern for phlegmon formation/inflammation with mass-effect on posterior wall of gastric antrum. -leukocytosis improved a bit to 18 K yesterday, now up to 20 again -Continue liquid diet, supportive care, fortunately ATN is improving -Fluids stopped yesterday due to fluid overload, bilateral pleural effusions  Acute kidney injury, likely ATN/elevated anion gap metabolic acidosis -Secondary to ATN from severe pancreatitis  -Improving, creatinine down to 1.2 , creatinine had peaked on 10 this admission -Fluids discontinued, IV Lasix 20 mg x 1 today due to fluid overload/pleural effusions  Odynophagia/severe burning pain  -Finally improving, -Continue PPI, Carafate -Diflucan  Epistaxis -  resolved, single episode 12/28  Elevated LFTs  -Likely from alcoholic hepatitis, improved  Alcohol abuse -Counseled, continue thiamine -No withdrawal noted  Hypokalemia -replace  Hyponatremia -Resolved  DVT prophylaxis: Heparin Code Status: Full code Family Communication: No family at bedside, discussed with patient Disposition Plan: Transfer out of stepdown  Consultants:  GI Nephrology PCCM  Procedures:  None   Microbiology  SARS-CoV-2 12/22-negative  Antimicrobials: None    Scheduled Meds: . alum & mag hydroxide-simeth  30 mL Oral Q6H   And  . lidocaine  15 mL Oral Q6H  . amLODipine  10 mg Oral Daily  . fluconazole  100 mg Oral Daily  . folic acid  1 mg Oral Daily  . heparin  5,000 Units Subcutaneous Q8H  . hydrALAZINE  10 mg Oral Q8H  . multivitamin with minerals  1 tablet Oral Daily  . oxymetazoline  1 spray Each Nare BID  . pantoprazole  40 mg Oral BID  . sucralfate  1 g Oral TID WC & HS  . thiamine  100 mg Oral Daily   Or  . thiamine  100 mg Intravenous Daily   Continuous Infusions:  PRN Meds:.bisacodyl, hydrALAZINE, labetalol, morphine injection, ondansetron **OR** ondansetron (ZOFRAN) IV, oxyCODONE, phenol, polyethylene glycol    Objective: Vitals:   01/14/19 2040 01/15/19 0500 01/15/19 0510 01/15/19 1402  BP: (!) 131/93  (!) 131/99 (!) 131/91  Pulse: 100  (!) 110 (!) 105  Resp: 19  19 16   Temp: 98.9 F (37.2 C)  99.4 F (37.4 C) 100.3 F (37.9 C)  TempSrc: Oral  Oral Oral  SpO2: 96%  97% 97%  Weight:  74.3 kg    Height:        Intake/Output Summary (Last 24 hours) at 01/15/2019 1409 Last data filed at 01/15/2019 1000 Gross per 24 hour  Intake 720 ml  Output --  Net 720 ml   Filed Weights   01/12/19 0235 01/14/19 0500 01/15/19 0500  Weight: 79.7 kg 75.4 kg 74.3 kg    Examination:  Gen: Pleasant thinly built female, AAOx3, no distress HEENT: PERRLA, Neck supple, no JVD Lungs: Few basilar rales CVS: RRR,No Gallops,Rubs or new  Murmurs Abd: Soft, mild epigastric and left upper quadrant tenderness, nondistended, bowel sounds present Extremities: No edema Skin: no new rashes  Data Reviewed: I have independently reviewed following labs and imaging studies   CBC: Recent Labs  Lab 01/09/19 0249 01/11/19 0158 01/12/19 0219 01/13/19 0223 01/14/19 0155 01/15/19 0518  WBC 10.8* 20.8* 20.2* 21.9* 18.4* 20.5*  NEUTROABS 6.4  --   --   --   --   --   HGB 9.0* 8.4* 8.7* 8.4* 8.3* 9.1*  HCT 25.8* 24.2* 25.5* 24.8* 24.4* 26.8*  MCV 92.5 92.0 93.1 94.3 97.2 94.7  PLT 431* 692* 838* 896* 758* [id number]*   Basic Metabolic Panel: Recent Labs  Lab 01/09/19 0249 01/10/19 0302 01/11/19 0158 01/12/19 0219 01/13/19 0223 01/14/19 0155 01/15/19 0518  NA 132*  132* 132* 134* 134* 135 138 138  K 2.9*  3.0* 3.2* 3.5 3.2* 3.2* 3.5 3.1*  CL 97*  97* 96* 98 101 101 106 100  CO2 17*  17* 16* 17* 19* 18* 21* 24  GLUCOSE 97  96 89 107* 99 91 93 93  BUN 81*  82* 89* 90* 80* 66* 46* 26*  CREATININE 10.36*  10.33* 10.85* 10.23* 7.40* 3.72* 1.99* 1.25*  CALCIUM 6.8*  6.8* 7.2* 8.0* 8.6* 8.7* 8.8* 8.8*  MG 2.1  --   --   --  1.4*  --   --   PHOS 3.3  3.4 2.9 2.3* 3.2 3.3  --   --    Liver Function Tests: Recent Labs  Lab 01/09/19 0249 01/11/19 0158 01/12/19 0219 01/13/19 0223 01/14/19 0155 01/15/19 0518  AST 32  --   --   --  17 17  ALT 37  --   --   --  18 15  ALKPHOS 88  --   --   --  79 89  BILITOT 0.7  --   --   --  1.1 0.8  PROT 6.0*  --   --   --  6.4* 7.1  ALBUMIN 2.6*  2.7* 2.8* 2.8* 2.8* 2.9* 3.1*   Coagulation Profile: Recent Labs  Lab 01/10/19 1230  INR 1.0   HbA1C: No results for input(s): HGBA1C in the last 72 hours. CBG: Recent Labs  Lab 01/10/19 0737 01/11/19 0746 01/12/19 0753 01/13/19 0811 01/14/19 0736  GLUCAP 75 99 85 84 93    Recent Results (from the past 240 hour(s))  MRSA PCR Screening     Status: None   Collection Time: 01/06/19  3:40 AM   Specimen: Nasal Mucosa;  Nasopharyngeal  Result Value Ref Range Status   MRSA by PCR NEGATIVE NEGATIVE Final    Comment:        The GeneXpert MRSA Assay (FDA approved for NASAL specimens only), is one component of a comprehensive MRSA colonization surveillance program. It is not intended to diagnose MRSA infection nor to guide or monitor treatment for MRSA infections. Performed at [facility], [place] [address] [place], [place] [address]      Radiology Studies: No results found.   Domenic Polite, MD [facility]

## 2019-01-16 LAB — CBC
HCT: 26.8 % — ABNORMAL LOW (ref 36.0–46.0)
Hemoglobin: 8.8 g/dL — ABNORMAL LOW (ref 12.0–15.0)
MCH: 32 pg (ref 26.0–34.0)
MCHC: 32.8 g/dL (ref 30.0–36.0)
MCV: 97.5 fL (ref 80.0–100.0)
Platelets: 678 10*3/uL — ABNORMAL HIGH (ref 150–400)
RBC: 2.75 MIL/uL — ABNORMAL LOW (ref 3.87–5.11)
RDW: 15.1 % (ref 11.5–15.5)
WBC: 18.1 10*3/uL — ABNORMAL HIGH (ref 4.0–10.5)
nRBC: 0.1 % (ref 0.0–0.2)

## 2019-01-16 LAB — COMPREHENSIVE METABOLIC PANEL
ALT: 12 U/L (ref 0–44)
AST: 18 U/L (ref 15–41)
Albumin: 3.1 g/dL — ABNORMAL LOW (ref 3.5–5.0)
Alkaline Phosphatase: 75 U/L (ref 38–126)
Anion gap: 11 (ref 5–15)
BUN: 13 mg/dL (ref 6–20)
CO2: 26 mmol/L (ref 22–32)
Calcium: 8.1 mg/dL — ABNORMAL LOW (ref 8.9–10.3)
Chloride: 99 mmol/L (ref 98–111)
Creatinine, Ser: 1.1 mg/dL — ABNORMAL HIGH (ref 0.44–1.00)
GFR calc Af Amer: >60 mL/min (ref >60)
GFR calc non Af Amer: >60 mL/min (ref >60)
Glucose, Bld: 92 mg/dL (ref 70–99)
Potassium: 3.5 mmol/L (ref 3.5–5.1)
Sodium: 136 mmol/L (ref 135–145)
Total Bilirubin: 0.7 mg/dL (ref 0.3–1.2)
Total Protein: 6.9 g/dL (ref 6.5–8.1)

## 2019-01-16 LAB — IGG 4: IgG, Subclass 4: 53 mg/dL (ref 2–96)

## 2019-01-16 LAB — GLUCOSE, CAPILLARY: Glucose-Capillary: 93 mg/dL (ref 70–99)

## 2019-01-16 NOTE — Progress Notes (Signed)
PROGRESS NOTE    Yolanda Flores  [id number] DOB: 1985/02/13 DOA: 01/04/2019 PCP: Patient, No Pcp Per    Brief Narrative: 34 year old lady prior history of alcohol abuse, GERD, hypertension presents with severe acute alcoholic pancreatitis complicated by severe ATN peak with a creatinine of 10. Creatinine improving, today's level at 1.1. Leukocytosis improved to 18.1, abdominal pain improving and patient is able to tolerate soft diet at this time. But she reports she is not back to baseline yet and would like to stay 1 more night on soft diet and and if abdominal pain continues to improve we'll plan for discharge tomorrow.  Assessment & Plan:   Active Problems:   HTN (hypertension)   Lactic acidosis   Alcohol abuse   Abnormal LFTs   Acute gallstone pancreatitis   Pancreatitis   Acute renal failure (HCC)   Shock circulatory (HCC)   Heartburn   Odynophagia   Acute alcoholic pancreatitis Improving last lipase levels at 74. Patient is currently on soft diet and is able to tolerate without any nausea vomiting. Abdominal pain present but improving, but not completely resolved.    ATN/acute renal failure Improved probably secondary to dehydration and severe acute pancreatitis. Creatinine at 1.1 today and continues to improve.    Hypertension Well-controlled    Alcohol abuse No signs of withdrawal at this time.   Elevated liver enzymes probably secondary to alcohol hepatitis, improving    Hypokalemia replace  Hyponatremia probably from severe dehydration from acute pancreatitis . Resolved    Odynophagia Continue with PPI and Carafate. DC Diflucan after tomorrow's dose.   DVT prophylaxis: Heparin Code Status: Full code Family Communication: None at bedside Disposition Plan: Possible discharge tomorrow if able to tolerate soft diet  Consultants:   Gastroenterology  Nephrology.  Procedures: MRI of the abdomen Antimicrobials:  None  Subjective: Patient appears in good spirits and denies any nausea vomiting, diarrhea. Abdominal pain is improving but not completely resolved.  Objective: Vitals:   01/15/19 1402 01/15/19 2032 01/16/19 0536 01/16/19 1406  BP: (!) 131/91 (!) 129/97 124/87 131/75  Pulse: (!) 105 91 92 81  Resp: 16 18 18 20   Temp: 100.3 F (37.9 C) 98.9 F (37.2 C) 98.2 F (36.8 C) 98.1 F (36.7 C)  TempSrc: Oral Oral Oral Oral  SpO2: 97% 98% 97% 98%  Weight:   73.7 kg   Height:        Intake/Output Summary (Last 24 hours) at 01/16/2019 1649 Last data filed at 01/16/2019 1558 Gross per 24 hour  Intake 1306 ml  Output 4 ml  Net 1302 ml   Filed Weights   01/14/19 0500 01/15/19 0500 01/16/19 0536  Weight: 75.4 kg 74.3 kg 73.7 kg    Examination:  General exam: Appears calm and comfortable not in any kind of distress. Respiratory system: Clear to auscultation. Respiratory effort normal. Cardiovascular system: S1 & S2 heard, RRR. Marland Kitchen No pedal edema. Gastrointestinal system: Abdomen is nondistended, soft and nontender, mild flank tenderness bilateral no organomegaly or masses felt. Normal bowel sounds heard. Central nervous system: Alert and oriented. No focal neurological deficits. Extremities: Symmetric 5 x 5 power. Skin: No rashes, lesions or ulcers Psychiatry: . Mood & affect appropriate.     Data Reviewed: I have personally reviewed following labs and imaging studies  CBC: Recent Labs  Lab 01/12/19 0219 01/13/19 0223 01/14/19 0155 01/15/19 0518 01/16/19 0630  WBC 20.2* 21.9* 18.4* 20.5* 18.1*  HGB 8.7* 8.4* 8.3* 9.1* 8.8*  HCT 25.5* 24.8* 24.4* 26.8*  26.8*  MCV 93.1 94.3 97.2 94.7 97.5  PLT 838* 896* 758* 775* [id number]*   Basic Metabolic Panel: Recent Labs  Lab 01/10/19 0302 01/11/19 0158 01/12/19 0219 01/13/19 0223 01/14/19 0155 01/15/19 0518 01/16/19 0630  NA 132* 134* 134* 135 138 138 136  K 3.2* 3.5 3.2* 3.2* 3.5 3.1* 3.5  CL 96* 98 101 101 106 100 99  CO2 16* 17*  19* 18* 21* 24 26  GLUCOSE 89 107* 99 91 93 93 92  BUN 89* 90* 80* 66* 46* 26* 13  CREATININE 10.85* 10.23* 7.40* 3.72* 1.99* 1.25* 1.10*  CALCIUM 7.2* 8.0* 8.6* 8.7* 8.8* 8.8* 8.1*  MG  --   --   --  1.4*  --   --   --   PHOS 2.9 2.3* 3.2 3.3  --   --   --    GFR: Estimated Creatinine Clearance: 72.1 mL/min (A) (by C-G formula based on SCr of 1.1 mg/dL (H)). Liver Function Tests: Recent Labs  Lab 01/12/19 0219 01/13/19 0223 01/14/19 0155 01/15/19 0518 01/16/19 0630  AST  --   --  17 17 18   ALT  --   --  18 15 12   ALKPHOS  --   --  79 89 75  BILITOT  --   --  1.1 0.8 0.7  PROT  --   --  6.4* 7.1 6.9  ALBUMIN 2.8* 2.8* 2.9* 3.1* 3.1*   Recent Labs  Lab 01/14/19 1018  LIPASE 74*   No results for input(s): AMMONIA in the last 168 hours. Coagulation Profile: Recent Labs  Lab 01/10/19 1230  INR 1.0   Cardiac Enzymes: No results for input(s): CKTOTAL, CKMB, CKMBINDEX, TROPONINI in the last 168 hours. BNP (last 3 results) No results for input(s): PROBNP in the last 8760 hours. HbA1C: No results for input(s): HGBA1C in the last 72 hours. CBG: Recent Labs  Lab 01/11/19 0746 01/12/19 0753 01/13/19 0811 01/14/19 0736 01/16/19 0750  GLUCAP 99 85 84 93 93   Lipid Profile: Recent Labs    01/14/19 1018  TRIG 274*   Thyroid Function Tests: No results for input(s): TSH, T4TOTAL, FREET4, T3FREE, THYROIDAB in the last 72 hours. Anemia Panel: No results for input(s): VITAMINB12, FOLATE, FERRITIN, TIBC, IRON, RETICCTPCT in the last 72 hours. Sepsis Labs: No results for input(s): PROCALCITON, LATICACIDVEN in the last 168 hours.  Recent Results (from the past 240 hour(s))  Culture, blood (routine x 2)     Status: None (Preliminary result)   Collection Time: 01/15/19  2:53 PM   Specimen: BLOOD  Result Value Ref Range Status   Specimen Description   Final    BLOOD RIGHT ARM Performed at [facility] [address], [place], [place] [address]     Special Requests   Final    BOTTLES DRAWN AEROBIC AND ANAEROBIC Blood Culture adequate volume Performed at [facility] [address] [place], [place] [address]    Culture   Final    NO GROWTH < 24 HOURS Performed at [facility] [address], [place], [place] [address]    Report Status PENDING  Incomplete  Culture, blood (routine x 2)     Status: None (Preliminary result)   Collection Time: 01/15/19  2:55 PM   Specimen: BLOOD  Result Value Ref Range Status   Specimen Description   Final    BLOOD LEFT ARM Performed at [facility] [address] [place], [place] [address]  Special Requests   Final    BOTTLES DRAWN AEROBIC AND ANAEROBIC Blood Culture adequate volume Performed at [facility] [address], [place], [place] [address]    Culture   Final    NO GROWTH < 24 HOURS Performed at [facility] [address] [place],  [address]    Report Status PENDING  Incomplete         Radiology Studies: No results found.      Scheduled Meds: . alum & mag hydroxide-simeth  30 mL Oral Q6H   And  . lidocaine  15 mL Oral Q6H  . amLODipine  10 mg Oral Daily  . fluconazole  100 mg Oral Daily  . folic acid  1 mg Oral Daily  . heparin  5,000 Units Subcutaneous Q8H  . hydrALAZINE  10 mg Oral Q8H  . multivitamin with minerals  1 tablet Oral Daily  . oxymetazoline  1 spray Each Nare BID  . pantoprazole  40 mg Oral BID  . sucralfate  1 g Oral TID WC & HS  . thiamine  100 mg Oral Daily   Or  . thiamine  100 mg Intravenous Daily   Continuous Infusions:   LOS: 12 days        Hosie Poisson, MD [facility]  01/16/2019, 4:49 PM

## 2019-01-17 ENCOUNTER — Inpatient Hospital Stay (HOSPITAL_COMMUNITY): Payer: BLUE CROSS/BLUE SHIELD

## 2019-01-17 DIAGNOSIS — D649 Anemia, unspecified: Secondary | ICD-10-CM

## 2019-01-17 DIAGNOSIS — D473 Essential (hemorrhagic) thrombocythemia: Secondary | ICD-10-CM | POA: Diagnosis not present

## 2019-01-17 DIAGNOSIS — D75839 Thrombocytosis, unspecified: Secondary | ICD-10-CM | POA: Diagnosis not present

## 2019-01-17 LAB — CBC WITH DIFFERENTIAL/PLATELET
Abs Immature Granulocytes: 0.45 10*3/uL — ABNORMAL HIGH (ref 0.00–0.07)
Basophils Absolute: 0.1 10*3/uL (ref 0.0–0.1)
Basophils Relative: 1 %
Eosinophils Absolute: 0.3 10*3/uL (ref 0.0–0.5)
Eosinophils Relative: 2 %
HCT: 25.8 % — ABNORMAL LOW (ref 36.0–46.0)
Hemoglobin: 8.5 g/dL — ABNORMAL LOW (ref 12.0–15.0)
Immature Granulocytes: 3 %
Lymphocytes Relative: 12 %
Lymphs Abs: 1.9 10*3/uL (ref 0.7–4.0)
MCH: 32.3 pg (ref 26.0–34.0)
MCHC: 32.9 g/dL (ref 30.0–36.0)
MCV: 98.1 fL (ref 80.0–100.0)
Monocytes Absolute: 1.2 10*3/uL — ABNORMAL HIGH (ref 0.1–1.0)
Monocytes Relative: 7 %
Neutro Abs: 12 10*3/uL — ABNORMAL HIGH (ref 1.7–7.7)
Neutrophils Relative %: 75 %
Platelets: 593 10*3/uL — ABNORMAL HIGH (ref 150–400)
RBC: 2.63 MIL/uL — ABNORMAL LOW (ref 3.87–5.11)
RDW: 15 % (ref 11.5–15.5)
WBC: 16 10*3/uL — ABNORMAL HIGH (ref 4.0–10.5)
nRBC: 0 % (ref 0.0–0.2)

## 2019-01-17 LAB — BASIC METABOLIC PANEL
Anion gap: 10 (ref 5–15)
BUN: 8 mg/dL (ref 6–20)
CO2: 28 mmol/L (ref 22–32)
Calcium: 8.2 mg/dL — ABNORMAL LOW (ref 8.9–10.3)
Chloride: 99 mmol/L (ref 98–111)
Creatinine, Ser: 0.99 mg/dL (ref 0.44–1.00)
GFR calc Af Amer: >60 mL/min (ref >60)
GFR calc non Af Amer: >60 mL/min (ref >60)
Glucose, Bld: 113 mg/dL — ABNORMAL HIGH (ref 70–99)
Potassium: 3.5 mmol/L (ref 3.5–5.1)
Sodium: 137 mmol/L (ref 135–145)

## 2019-01-17 LAB — LIPASE, BLOOD: Lipase: 81 U/L — ABNORMAL HIGH (ref 11–51)

## 2019-01-17 LAB — GLUCOSE, CAPILLARY: Glucose-Capillary: 90 mg/dL (ref 70–99)

## 2019-01-17 MED ORDER — AMLODIPINE BESYLATE 10 MG PO TABS: 10.0000 mg | ORAL_TABLET | Freq: Every day | ORAL | 0 refills | Status: DC

## 2019-01-17 MED ORDER — HYDRALAZINE HCL 10 MG PO TABS: 10.0000 mg | ORAL_TABLET | Freq: Three times a day (TID) | ORAL | 1 refills | Status: DC

## 2019-01-17 MED ORDER — PANTOPRAZOLE SODIUM 40 MG PO TBEC: 40.0000 mg | DELAYED_RELEASE_TABLET | Freq: Two times a day (BID) | ORAL | 0 refills | Status: DC

## 2019-01-17 MED ORDER — FOLIC ACID 1 MG PO TABS: 1.0000 mg | ORAL_TABLET | Freq: Every day | ORAL | 0 refills | Status: DC

## 2019-01-17 MED ORDER — ADULT MULTIVITAMIN W/MINERALS CH: 1.0000 | ORAL_TABLET | Freq: Every day | ORAL | Status: AC

## 2019-01-17 MED ORDER — POLYETHYLENE GLYCOL 3350 17 G PO PACK: 17.0000 g | PACK | Freq: Every day | ORAL | 0 refills | Status: DC | PRN

## 2019-01-17 MED ORDER — THIAMINE HCL 100 MG PO TABS: 100.0000 mg | ORAL_TABLET | Freq: Every day | ORAL | Status: DC

## 2019-01-17 MED ORDER — FERROUS SULFATE 325 (65 FE) MG PO TABS
325.0000 mg | ORAL_TABLET | Freq: Two times a day (BID) | ORAL | Status: DC
  Administered 2019-01-17 – 2019-01-18 (×2): 325 mg via ORAL
  Filled 2019-01-17 (×2): qty 1

## 2019-01-17 MED ORDER — SUCRALFATE 1 GM/10ML PO SUSP: 1.0000 g | Freq: Three times a day (TID) | ORAL | 2 refills | Status: DC

## 2019-01-17 NOTE — Progress Notes (Signed)
PROGRESS NOTE    Yolanda Flores  [id number] DOB: 1985-12-14 DOA: 01/04/2019 PCP: Patient, No Pcp Per    Brief Narrative: 34 year old lady prior history of alcohol abuse, GERD, hypertension presents with severe acute alcoholic pancreatitis complicated by severe ATN peak with a creatinine of 10. Creatinine improving, today's level at 1.1. Leukocytosis improved to 18.1, abdominal pain improving and patient is able to tolerate soft diet at this time. Patient reports she has some back in the back/ flank when she takes a deep breath.   Assessment & Plan:   Active Problems:   HTN (hypertension)   Lactic acidosis   Alcohol abuse   Abnormal LFTs   Acute gallstone pancreatitis   Pancreatitis   Acute renal failure (HCC)   Shock circulatory (HCC)   Heartburn   Odynophagia   Acute alcoholic pancreatitis Improving last lipase levels at 81. Patient is currently on soft diet and is able to tolerate without any nausea vomiting. abd pain improving, none in the epigastric area, some pain in the left flank.  Discontinued the IV pain meds and plan to wean her off the oral pain medications.  Triglyceride levels 274 this admission.,  CRP 7.9. GI consulted and recommend outpatient follow-up in 4 to 6 weeks and they will make arrangements for follow-up.  Recommended small low-fat meals at this time  ATN/Acute renal failure Improved probably secondary to dehydration and severe acute pancreatitis. Creatinine back to baseline today.   Lactic acidosis Probably secondary to dehydration Resolved  Hypertension Well-controlled.    Alcohol abuse No signs of withdrawal at this time.   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    Hypokalemia Replaced  Hyponatremia  probably from severe dehydration from acute pancreatitis Resolved    Odynophagia Continue with PPI and Carafate. Discontinue Diflucan after today's  dose.   Leukocytosis probably from acute pancreatitis Improving.   GERD Stable continue with PPI.  Anemia of acute illness Baseline hemoglobin appears to be around 12 Hemoglobin started dropping from 12-10 to 9.8-8.5. No obvious signs of bleeding. Anemia panel on admission shows low iron levels with low TIBC.  Normal folate and elevated vitamin B12 levels. Iron supplementation recommended on discharge. Recommend to follow with CBC in 1 to 2 weeks.   Thrombocytosis probably secondary to anemia and iron deficiency and acute pancreatitis.  Recommend checking platelet count in about 1 to 2 weeks.   DVT prophylaxis: Heparin Code Status: Full code Family Communication: None at bedside Disposition Plan: Possible discharge tomorrow if able to tolerate soft diet  Consultants:   Gastroenterology  Nephrology.  Procedures: MRI of the abdomen Antimicrobials: None  Subjective: Patient denies any chest pain or shortness of breath but reports bilateral flank pain on deep inspiration.  Chest x-ray ordered for further evaluation.  She denies any nausea vomiting and abdominal pain has improved.  Objective: Vitals:   01/16/19 1406 01/16/19 2054 01/17/19 0513 01/17/19 1350  BP: 131/75 114/84 122/80 120/82  Pulse: 81 99 99 96  Resp: 20 16 18 18   Temp: 98.1 F (36.7 C) 98.8 F (37.1 C) 99.2 F (37.3 C) 99.5 F (37.5 C)  TempSrc: Oral Oral Oral Oral  SpO2: 98% 98% 96% 96%  Weight:   74.9 kg   Height:        Intake/Output Summary (Last 24 hours) at 01/17/2019 1350 Last data filed at 01/16/2019 1558 Gross per 24 hour  Intake 591 ml  Output --  Net 591 ml  Filed Weights   01/15/19 0500 01/16/19 0536 01/17/19 0513  Weight: 74.3 kg 73.7 kg 74.9 kg    Examination:  General exam: Alert and comfortable not in any kind of distress. Respiratory system: Clear to auscultation bilaterally, no wheezing or rhonchi Cardiovascular system: S1-S2 heard, regular rate rhythm, no pedal  edema Gastrointestinal system: Abdomen is soft, nontender mild flank tenderness on the left side, bowel sounds are normal. Central nervous system: Alert and oriented and grossly nonfocal Extremities: No pedal edema Skin: No rashes seen Psychiatry: .  Normal mood.    Data Reviewed: I have personally reviewed following labs and imaging studies  CBC: Recent Labs  Lab 01/13/19 0223 01/14/19 0155 01/15/19 0518 01/16/19 0630 01/17/19 1218  WBC 21.9* 18.4* 20.5* 18.1* 16.0*  NEUTROABS  --   --   --   --  12.0*  HGB 8.4* 8.3* 9.1* 8.8* 8.5*  HCT 24.8* 24.4* 26.8* 26.8* 25.8*  MCV 94.3 97.2 94.7 97.5 98.1  PLT 896* 758* 775* 678* 0000000*   Basic Metabolic Panel: Recent Labs  Lab 01/11/19 0158 01/12/19 0219 01/13/19 0223 01/14/19 0155 01/15/19 0518 01/16/19 0630 01/17/19 1218  NA 134* 134* 135 138 138 136 137  K 3.5 3.2* 3.2* 3.5 3.1* 3.5 3.5  CL 98 101 101 106 100 99 99  CO2 17* 19* 18* 21* 24 26 28   GLUCOSE 107* 99 91 93 93 92 113*  BUN 90* 80* 66* 46* 26* 13 8  CREATININE 10.23* 7.40* 3.72* 1.99* 1.25* 1.10* 0.99  CALCIUM 8.0* 8.6* 8.7* 8.8* 8.8* 8.1* 8.2*  MG  --   --  1.4*  --   --   --   --   PHOS 2.3* 3.2 3.3  --   --   --   --    GFR: Estimated Creatinine Clearance: 87 mL/min (by C-G formula based on SCr of 0.99 mg/dL). Liver Function Tests: Recent Labs  Lab 01/12/19 0219 01/13/19 0223 01/14/19 0155 01/15/19 0518 01/16/19 0630  AST  --   --  17 17 18   ALT  --   --  18 15 12   ALKPHOS  --   --  79 89 75  BILITOT  --   --  1.1 0.8 0.7  PROT  --   --  6.4* 7.1 6.9  ALBUMIN 2.8* 2.8* 2.9* 3.1* 3.1*   Recent Labs  Lab 01/14/19 1018 01/17/19 1218  LIPASE 74* 81*   No results for input(s): AMMONIA in the last 168 hours. Coagulation Profile: No results for input(s): INR, PROTIME in the last 168 hours. Cardiac Enzymes: No results for input(s): CKTOTAL, CKMB, CKMBINDEX, TROPONINI in the last 168 hours. BNP (last 3 results) No results for input(s): PROBNP in  the last 8760 hours. HbA1C: No results for input(s): HGBA1C in the last 72 hours. CBG: Recent Labs  Lab 01/12/19 0753 01/13/19 0811 01/14/19 0736 01/16/19 0750 01/17/19 0745  GLUCAP 85 84 93 93 90   Lipid Profile: No results for input(s): CHOL, HDL, LDLCALC, TRIG, CHOLHDL, LDLDIRECT in the last 72 hours. Thyroid Function Tests: No results for input(s): TSH, T4TOTAL, FREET4, T3FREE, THYROIDAB in the last 72 hours. Anemia Panel: No results for input(s): VITAMINB12, FOLATE, FERRITIN, TIBC, IRON, RETICCTPCT in the last 72 hours. Sepsis Labs: No results for input(s): PROCALCITON, LATICACIDVEN in the last 168 hours.  Recent Results (from the past 240 hour(s))  Culture, blood (routine x 2)     Status: None (Preliminary result)   Collection Time: 01/15/19  2:53  PM   Specimen: BLOOD  Result Value Ref Range Status   Specimen Description   Final    BLOOD RIGHT ARM Performed at [facility] [address] [place], [place] [address]    Special Requests   Final    BOTTLES DRAWN AEROBIC AND ANAEROBIC Blood Culture adequate volume Performed at [facility] [address], [place], [place] [address]    Culture   Final    NO GROWTH < 24 HOURS Performed at [facility] [address], [place], [place] [address]    Report Status PENDING  Incomplete  Culture, blood (routine x 2)     Status: None (Preliminary result)   Collection Time: 01/15/19  2:55 PM   Specimen: BLOOD  Result Value Ref Range Status   Specimen Description   Final    BLOOD LEFT ARM Performed at [facility] [address], [place], [place] [address]    Special Requests   Final    BOTTLES DRAWN AEROBIC AND ANAEROBIC Blood Culture adequate volume Performed at [facility] [address], [place], [place] [address]    Culture   Final    NO GROWTH < 24 HOURS Performed at [facility] [address] [place], [place] [address]     Report Status PENDING  Incomplete         Radiology Studies: No results found.      Scheduled Meds: . alum & mag hydroxide-simeth  30 mL Oral Q6H   And  . lidocaine  15 mL Oral Q6H  . amLODipine  10 mg Oral Daily  . fluconazole  100 mg Oral Daily  . folic acid  1 mg Oral Daily  . heparin  5,000 Units Subcutaneous Q8H  . hydrALAZINE  10 mg Oral Q8H  . multivitamin with minerals  1 tablet Oral Daily  . oxymetazoline  1 spray Each Nare BID  . pantoprazole  40 mg Oral BID  . sucralfate  1 g Oral TID WC & HS  . thiamine  100 mg Oral Daily   Or  . thiamine  100 mg Intravenous Daily   Continuous Infusions:   LOS: 13 days        Hosie Poisson, MD [facility]  01/17/2019, 1:50 PM

## 2019-01-18 ENCOUNTER — Telehealth: Payer: Self-pay

## 2019-01-18 LAB — GLUCOSE, CAPILLARY: Glucose-Capillary: 97 mg/dL (ref 70–99)

## 2019-01-18 MED ORDER — FERROUS SULFATE 325 (65 FE) MG PO TABS: 325.0000 mg | ORAL_TABLET | Freq: Two times a day (BID) | ORAL | 3 refills | Status: DC

## 2019-01-18 MED ORDER — OXYCODONE HCL 5 MG PO TABS: 5.0000 mg | ORAL_TABLET | ORAL | 0 refills | Status: AC | PRN

## 2019-01-18 NOTE — Plan of Care (Signed)
Diet and ETOH abstinence discussed, Pt reports no more alcohol for her, handouts on Iron rich foods & pancreatitis provided

## 2019-01-18 NOTE — Progress Notes (Signed)
Discussed with patient discharge instructions, they verbalized agreement and understanding.  Patient to leave in private vehicle with all belongings.

## 2019-01-18 NOTE — Discharge Summary (Signed)
Physician Discharge Summary  Yolanda Flores [id number] DOB: 1985/08/12 DOA: 01/04/2019  PCP: Patient, No Pcp Per  Admit date: 01/04/2019 Discharge date: 01/18/2019  Admitted From: Home.  Disposition:  Home.   Recommendations for Outpatient Follow-up:  1. Follow up with PCP in 1-2 weeks 2. Please obtain BMP/CBC in one week Please follow up with gastroenterology as recommended.  Please follow up with a CXR in 2 to 4 weeks  Discharge Condition: stable.  CODE STATUS: full code.  Diet recommendation: Heart Healthy    Brief/Interim Summary: 34 year old lady prior history of alcohol abuse, GERD, hypertension presents with severe acute alcoholic pancreatitis complicated by severe ATN peak with a creatinine of 10. Creatinine improving, today's level at 1.1. Leukocytosis improved to 18.1, abdominal pain improving and patient is able to tolerate soft diet at this time.   Discharge Diagnoses:  Active Problems:   HTN (hypertension)   Hypokalemia   Lactic acidosis   Alcohol abuse   Abnormal LFTs   Acute gallstone pancreatitis   Pancreatitis   Acute renal failure (HCC)   Shock circulatory (HCC)   Heartburn   Odynophagia   Anemia   Thrombocytosis (HCC)  Acute alcoholic pancreatitis Improving last lipase levels at 81. Patient is currently on soft diet and is able to tolerate without any nausea vomiting. abd pain . Flank pain improved.   Discontinued the IV pain meds and weaned her to low dose pain meds.  Triglyceride levels 274 this admission.,  CRP 7.9. GI consulted and recommend outpatient follow-up in 4 to 6 weeks and they will make arrangements for follow-up.  Recommended small low-fat meals at this time  ATN/Acute renal failure Improved probably secondary to dehydration and severe acute pancreatitis. Creatinine back to baseline today.   Lactic acidosis Probably secondary to dehydration Resolved  Hypertension Well-controlled.    Alcohol abuse No signs of  withdrawal at this time.   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    Hypokalemia Replaced  Hyponatremia  probably from severe dehydration from acute pancreatitis Resolved    Odynophagia Continue with PPI and Carafate. Completed the course of diflucan.    Leukocytosis probably from acute pancreatitis Improving.   GERD Stable continue with PPI.  Anemia of acute illness Baseline hemoglobin appears to be around 12 Hemoglobin started dropping from 12-10 to 9.8-8.5. No obvious signs of bleeding. Anemia panel on admission shows low iron levels with low TIBC.  Normal folate and elevated vitamin B12 levels. Iron supplementation recommended on discharge. Recommend to follow with CBC in 1 to 2 weeks.   Thrombocytosis probably secondary to anemia and iron deficiency and acute pancreatitis.  Recommend checking platelet count in about 1 to 2 weeks.    Discharge Instructions  Discharge Instructions    Diet - low sodium heart healthy   Complete by: As directed    Discharge instructions   Complete by: As directed    Please follow up with PCP in one week.  Please follow up with gastroenterology as recommended.     Allergies as of 01/18/2019   No Known Allergies     Medication List    STOP taking these medications   lisinopril 20 MG tablet Commonly known as: ZESTRIL   metoprolol tartrate 25 MG tablet Commonly known as: LOPRESSOR     TAKE these medications   amLODipine 10 MG tablet Commonly known as: NORVASC Take 1 tablet (10 mg total) by mouth daily.   ferrous sulfate 325 (  65 FE) MG tablet Take 1 tablet (325 mg total) by mouth 2 (two) times daily with a meal.   folic acid 1 MG tablet Commonly known as: FOLVITE Take 1 tablet (1 mg total) by mouth daily.   hydrALAZINE 10 MG tablet Commonly known as: APRESOLINE Take 1 tablet (10 mg total) by mouth every 8 (eight) hours.    multivitamin with minerals Tabs tablet Take 1 tablet by mouth daily.   oxyCODONE 5 MG immediate release tablet Commonly known as: Oxy IR/ROXICODONE Take 1 tablet (5 mg total) by mouth every 4 (four) hours as needed for up to 5 days for moderate pain.   pantoprazole 40 MG tablet Commonly known as: PROTONIX Take 1 tablet (40 mg total) by mouth 2 (two) times daily. What changed: when to take this   polyethylene glycol 17 g packet Commonly known as: MIRALAX / GLYCOLAX Take 17 g by mouth daily as needed for mild constipation.   sucralfate 1 GM/10ML suspension Commonly known as: CARAFATE Take 10 mLs (1 g total) by mouth 4 (four) times daily -  with meals and at bedtime.   thiamine 100 MG tablet Take 1 tablet (100 mg total) by mouth daily.      [facility].   Specialty: Addiction Medicine Contact information: [place] [address] [phone]        [facility] Follow up.   Contact information: [address] [place] [address] [phone]        [facility] Follow up.   Contact information: [place] [id number] [place]. Go to.   Why: Appt. JANUARY 25TH 2021 @ 3:30PM . Please arrive 15 minutes early to complete new patient forms.  Contact information: [address] [place] [phone]         No Known Allergies  Consultations:  gastroenterology   Procedures/Studies: CT ABDOMEN PELVIS WO CONTRAST  Result Date: 01/13/2019 CLINICAL DATA:  Abdominal pain, fever and elevated white count EXAM: CT ABDOMEN AND PELVIS WITHOUT CONTRAST TECHNIQUE: Multidetector CT imaging of the abdomen and pelvis was performed following the standard protocol without IV contrast. COMPARISON:  MRCP 01/05/2019 FINDINGS: Lower chest: Small to moderate bilateral  pleural effusions identified, right greater than left. Hepatobiliary: No focal liver abnormality is seen. No gallstones, gallbladder wall thickening, or biliary dilatation. Pancreas: Persistent and progressive changes of diffuse pancreatitis noted. Secondary to lack of IV contrast material the pancreas is difficult to identified separate from surrounding upper abdominal inflammatory changes. Fat stranding, phlegmon and free fluid in the upper abdomen appears increased from previous exam. Spleen: Normal in size without focal abnormality. Adrenals/Urinary Tract: Normal appearance of the adrenal glands. Kidneys are unremarkable. Urinary bladder appears normal. Stomach/Bowel: The stomach is nondistended. Inflammatory changes associated with pancreatitis have mass effect upon the posterior wall of the gastric antrum. No significant bowel wall thickening, inflammation or distension. The appendix is visualized and appears normal. Vascular/Lymphatic: Normal appearance of the abdominal aorta. No abdominopelvic adenopathy identified. Reproductive: Uterus and bilateral adnexa are unremarkable. Other: Free fluid is noted extending along the pericolic gutters and into the pelvis. Within the limitations of unenhanced technique no discrete fluid collection identified. Musculoskeletal: No acute or significant osseous findings. IMPRESSION: 1. Persistent and progressive changes of acute pancreatitis. Evaluation for pseudocyst or pancreatic necrosis is limited due to  lack of intravenous contrast material. 2. Phlegmon formation/inflammation within the upper abdomen demonstrates mass effect upon the posterior wall of the gastric antrum. 3. Small to moderate bilateral pleural effusions, left greater than right. Electronically Signed   By: Kerby Moors M.D.   On: 01/13/2019 13:39   DG Chest 2 View  Result Date: 01/17/2019 CLINICAL DATA:  Recent hospitalization for pancreatitis. Persistent left-sided back pain. EXAM: CHEST - 2 VIEW  COMPARISON:  One-view chest x-ray 01/13/2019 FINDINGS: The heart size is normal. Bilateral pleural effusions are present. Left greater than right airspace opacities are present. Upper lung fields are clear. IMPRESSION: 1. Stable bilateral pleural effusions. 2. Left greater than right airspace disease likely reflects atelectasis. Infection is considered less likely. Electronically Signed   By: San Morelle M.D.   On: 01/17/2019 15:13   US RENAL  Result Date: 01/05/2019 CLINICAL DATA:  Acute kidney insufficiency. Nausea and vomiting. Dehydration. EXAM: RENAL / URINARY TRACT ULTRASOUND COMPLETE COMPARISON:  None. FINDINGS: Right Kidney: Renal measurements: 12.5 x 6.3 x 5.5 cm = volume: 230 mL. The parenchyma is diffusely echogenic. There is no stone or mass lesion. No hydronephrosis is present. Left Kidney: Renal measurements: 12.5 x 5.7 x 5.1 cm = volume: 188 mL. The parenchyma is diffusely echogenic. There is no stone or mass lesion. No hydronephrosis is present. Bladder: Appears normal for degree of bladder distention. Other: Small amount of ascites is present. Perinephric fluid is evident bilaterally. IMPRESSION: 1. Echogenic kidneys bilaterally. This is nonspecific, but can be seen in the setting of medical renal disease. No obstructive disease is present. 2. Abdominal ascites. Electronically Signed   By: San Morelle M.D.   On: 01/05/2019 19:03   MR ABDOMEN MRCP WO CONTRAST  Result Date: 01/05/2019 CLINICAL DATA:  Acute pancreatitis, nausea/vomiting/abdominal pain, ETOH EXAM: MRI ABDOMEN WITHOUT CONTRAST  (INCLUDING MRCP) TECHNIQUE: Multiplanar multisequence MR imaging of the abdomen was performed. Heavily T2-weighted images of the biliary and pancreatic ducts were obtained, and three-dimensional MRCP images were rendered by post processing. COMPARISON:  None. FINDINGS: Lower chest: Small left and trace right pleural effusions. Hepatobiliary: Mild hepatic steatosis. No morphologic  findings of cirrhosis. No focal hepatic lesion is seen. Mild gallbladder sludge (series 11/image 62). No cholelithiasis, gallbladder wall thickening, or inflammatory changes. Mild pericholecystic fluid/edema is likely related to the peripancreatic inflammatory process (described below). No intrahepatic or extrahepatic ductal dilatation. Common duct measures 4 mm. No choledocholithiasis is seen. Pancreas: Moderate peripancreatic fluid/ascites, likely reflecting acute pancreatitis. No pancreatic hemorrhage. Pancreatic necrosis cannot be assessed in the absence of intravenous contrast administration. No walled-off necrosis/pseudocyst. Spleen:  Within normal limits. Adrenals/Urinary Tract:  Adrenal glands are within normal limits. Kidneys are within normal limits, noting mild right perinephric fluid. No hydronephrosis. Stomach/Bowel: Stomach is within normal limits, noting mild fluid along the gastric cardia. Visualized bowel is unremarkable. Vascular/Lymphatic:  No evidence of abdominal aortic aneurysm. No suspicious abdominal lymphadenopathy. Other: Moderate upper abdominal ascites centered along the pancreas and right upper quadrant. Musculoskeletal: No focal osseous lesions. IMPRESSION: Acute pancreatitis with moderate abdominal ascites. No walled-off necrosis/pseudocyst. Mild gallbladder sludge, without associated inflammatory changes to suggest acute cholecystitis. No intrahepatic or extrahepatic ductal dilatation. No choledocholithiasis is seen. Electronically Signed   By: Julian Hy M.D.   On: 01/05/2019 12:16   MR 3D Recon At Scanner  Result Date: 01/05/2019 CLINICAL DATA:  Acute pancreatitis, nausea/vomiting/abdominal pain, ETOH EXAM: MRI ABDOMEN WITHOUT CONTRAST  (INCLUDING MRCP) TECHNIQUE: Multiplanar multisequence MR imaging of the abdomen was performed. Heavily  T2-weighted images of the biliary and pancreatic ducts were obtained, and three-dimensional MRCP images were rendered by post  processing. COMPARISON:  None. FINDINGS: Lower chest: Small left and trace right pleural effusions. Hepatobiliary: Mild hepatic steatosis. No morphologic findings of cirrhosis. No focal hepatic lesion is seen. Mild gallbladder sludge (series 11/image 62). No cholelithiasis, gallbladder wall thickening, or inflammatory changes. Mild pericholecystic fluid/edema is likely related to the peripancreatic inflammatory process (described below). No intrahepatic or extrahepatic ductal dilatation. Common duct measures 4 mm. No choledocholithiasis is seen. Pancreas: Moderate peripancreatic fluid/ascites, likely reflecting acute pancreatitis. No pancreatic hemorrhage. Pancreatic necrosis cannot be assessed in the absence of intravenous contrast administration. No walled-off necrosis/pseudocyst. Spleen:  Within normal limits. Adrenals/Urinary Tract:  Adrenal glands are within normal limits. Kidneys are within normal limits, noting mild right perinephric fluid. No hydronephrosis. Stomach/Bowel: Stomach is within normal limits, noting mild fluid along the gastric cardia. Visualized bowel is unremarkable. Vascular/Lymphatic:  No evidence of abdominal aortic aneurysm. No suspicious abdominal lymphadenopathy. Other: Moderate upper abdominal ascites centered along the pancreas and right upper quadrant. Musculoskeletal: No focal osseous lesions. IMPRESSION: Acute pancreatitis with moderate abdominal ascites. No walled-off necrosis/pseudocyst. Mild gallbladder sludge, without associated inflammatory changes to suggest acute cholecystitis. No intrahepatic or extrahepatic ductal dilatation. No choledocholithiasis is seen. Electronically Signed   By: Julian Hy M.D.   On: 01/05/2019 12:16   RUQ [place]  Result Date: 01/04/2019 CLINICAL DATA:  Upper abdominal pain EXAM: ULTRASOUND ABDOMEN LIMITED RIGHT UPPER QUADRANT COMPARISON:  None. FINDINGS: Gallbladder: No gallstones or wall thickening visualized. There is slight pericholecystic  fluid. No sonographic Murphy sign noted by sonographer. Common bile duct: Diameter: 5 mm. No intrahepatic or extrahepatic biliary duct dilatation. Liver: No focal lesion identified. Liver echogenicity is increased. Portal vein is patent on color Doppler imaging with normal direction of blood flow towards the liver. Other: None. IMPRESSION: 1. There is mild pericholecystic fluid. No gallbladder wall thickening or gallstones evident. Etiology for this pericholecystic fluid is uncertain. This is a finding that may be associated with early acute cholecystitis. In this regard, it may be prudent to consider nuclear medicine hepatobiliary imaging study to assess for cystic duct patency. 2. Increase in liver echogenicity, a finding indicative of hepatic steatosis. No focal liver lesions are evident. Electronically Signed   By: Lowella Grip III M.D.   On: 01/04/2019 16:18   DG CHEST PORT 1 VIEW  Result Date: 01/13/2019 CLINICAL DATA:  Dyspnea EXAM: PORTABLE CHEST 1 VIEW COMPARISON:  01/09/2019 FINDINGS: Persistent left lower lobe consolidation appears slightly improved. Small left effusion Right lung remains clear.  Negative for heart failure or edema. IMPRESSION: Left lower lobe consolidation with slight interval improvement. Electronically Signed   By: Franchot Gallo M.D.   On: 01/13/2019 09:19   DG CHEST PORT 1 VIEW  Result Date: 01/09/2019 CLINICAL DATA:  Shortness of breath. EXAM: PORTABLE CHEST 1 VIEW COMPARISON:  01/06/2019 FINDINGS: Lungs are hypoinflated demonstrate persistent opacification over the left base/retrocardiac region with obscuration of the hemidiaphragm likely representing a small to moderate effusion with associated basilar atelectasis. Infection in the left base is possible. Cardiomediastinal silhouette and remainder of the exam is unchanged. IMPRESSION: Stable left base/retrocardiac opacification likely combination of effusion and atelectasis. Infection in the left base is possible.  Electronically Signed   By: Marin Olp M.D.   On: 01/09/2019 04:45   DG Chest Port 1 View  Result Date: 01/06/2019 CLINICAL DATA:  Shortness of breath today, dyspnea EXAM: PORTABLE CHEST 1 VIEW  COMPARISON:  Portable exam 1408 hours compared to 05/24/2018 FINDINGS: Normal heart size and mediastinal contours. Dense consolidation of LEFT lower lobe question atelectasis versus infiltrate. Minimal atelectasis or infiltrate at medial RIGHT lung base. Decreased lung volumes. Cannot exclude small LEFT pleural effusion. No pneumothorax or acute osseous findings. IMPRESSION: Atelectasis versus consolidation LEFT lower lobe and minimally at medial RIGHT lung base. Question small LEFT pleural effusion. Electronically Signed   By: Lavonia Dana M.D.   On: 01/06/2019 14:17       Subjective: No nausea, vomiting, abd pain.   Discharge Exam: Vitals:   01/18/19 0532 01/18/19 1346  BP: 120/80 125/89  Pulse: (!) 101 99  Resp: 18 16  Temp: 98.6 F (37 C) 98.1 F (36.7 C)  SpO2: 97% 93%   Vitals:   01/17/19 2102 01/18/19 0500 01/18/19 0532 01/18/19 1346  BP: 110/81  120/80 125/89  Pulse: 100  (!) 101 99  Resp: 18  18 16   Temp: 98.8 F (37.1 C)  98.6 F (37 C) 98.1 F (36.7 C)  TempSrc: Oral  Oral Oral  SpO2: 96%  97% 93%  Weight:  73.7 kg    Height:        General: Pt is alert, awake, not in acute distress Cardiovascular: RRR, S1/S2 +, no rubs, no gallops Respiratory: CTA bilaterally, no wheezing, no rhonchi Abdominal: Soft, NT, ND, bowel sounds + Extremities: no edema, no cyanosis    The results of significant diagnostics from this hospitalization (including imaging, microbiology, ancillary and laboratory) are listed below for reference.     Microbiology: Recent Results (from the past 240 hour(s))  Culture, blood (routine x 2)     Status: None (Preliminary result)   Collection Time: 01/15/19  2:53 PM   Specimen: BLOOD  Result Value Ref Range Status   Specimen Description   Final     BLOOD RIGHT ARM Performed at [facility] [address] [place], [place] [address]    Special Requests   Final    BOTTLES DRAWN AEROBIC AND ANAEROBIC Blood Culture adequate volume Performed at [facility] [address] [place], [place] [address]    Culture   Final    NO GROWTH 3 DAYS Performed at [facility], [place] [address], [place], [place] [address]    Report Status PENDING  Incomplete  Culture, blood (routine x 2)     Status: None (Preliminary result)   Collection Time: 01/15/19  2:55 PM   Specimen: BLOOD  Result Value Ref Range Status   Specimen Description   Final    BLOOD LEFT ARM Performed at [facility] [address], [place], [place] [address]    Special Requests   Final    BOTTLES DRAWN AEROBIC AND ANAEROBIC Blood Culture adequate volume Performed at [facility] [address] [place], [place] [address]    Culture   Final    NO GROWTH 3 DAYS Performed at [facility], [place] [address] [place], [place] [address]    Report Status PENDING  Incomplete     Labs: BNP (last 3 results) No results for input(s): BNP in the last 8760 hours. Basic Metabolic Panel: Recent Labs  Lab 01/12/19 0219 01/13/19 0223 01/14/19 0155 01/15/19 0518 01/16/19 0630 01/17/19 1218  NA 134* 135 138 138 136 137  K 3.2* 3.2* 3.5 3.1* 3.5 3.5  CL 101 101 106 100 99 99  CO2 19* 18* 21* 24 26  28  GLUCOSE 99 91 93 93 92 113*  BUN 80* 66* 46* 26* 13 8  CREATININE 7.40* 3.72* 1.99* 1.25* 1.10* 0.99  CALCIUM 8.6* 8.7* 8.8* 8.8* 8.1* 8.2*  MG  --  1.4*  --   --   --   --   PHOS 3.2 3.3  --   --   --   --    Liver Function Tests: Recent Labs  Lab 01/12/19 0219 01/13/19 0223 01/14/19 0155 01/15/19 0518 01/16/19 0630  AST  --   --  17 17 18   ALT  --   --  18 15 12   ALKPHOS  --   --  79 89 75  BILITOT  --   --  1.1 0.8 0.7  PROT  --   --  6.4* 7.1 6.9  ALBUMIN 2.8* 2.8* 2.9* 3.1*  3.1*   Recent Labs  Lab 01/14/19 1018 01/17/19 1218  LIPASE 74* 81*   No results for input(s): AMMONIA in the last 168 hours. CBC: Recent Labs  Lab 01/13/19 0223 01/14/19 0155 01/15/19 0518 01/16/19 0630 01/17/19 1218  WBC 21.9* 18.4* 20.5* 18.1* 16.0*  NEUTROABS  --   --   --   --  12.0*  HGB 8.4* 8.3* 9.1* 8.8* 8.5*  HCT 24.8* 24.4* 26.8* 26.8* 25.8*  MCV 94.3 97.2 94.7 97.5 98.1  PLT 896* 758* 775* 678* 593*   Cardiac Enzymes: No results for input(s): CKTOTAL, CKMB, CKMBINDEX, TROPONINI in the last 168 hours. BNP: Invalid input(s): POCBNP CBG: Recent Labs  Lab 01/13/19 0811 01/14/19 0736 01/16/19 0750 01/17/19 0745 01/18/19 0921  GLUCAP 84 93 93 90 97   D-Dimer No results for input(s): DDIMER in the last 72 hours. Hgb A1c No results for input(s): HGBA1C in the last 72 hours. Lipid Profile No results for input(s): CHOL, HDL, LDLCALC, TRIG, CHOLHDL, LDLDIRECT in the last 72 hours. Thyroid function studies No results for input(s): TSH, T4TOTAL, T3FREE, THYROIDAB in the last 72 hours.  Invalid input(s): FREET3 Anemia work up No results for input(s): VITAMINB12, FOLATE, FERRITIN, TIBC, IRON, RETICCTPCT in the last 72 hours. Urinalysis    Component Value Date/Time   COLORURINE YELLOW 05/25/2018 0326   APPEARANCEUR CLEAR 05/25/2018 0326   LABSPEC 1.010 05/25/2018 0326   PHURINE 7.5 05/25/2018 0326   GLUCOSEU NEGATIVE 05/25/2018 0326   HGBUR SMALL (A) 05/25/2018 0326   BILIRUBINUR NEGATIVE 05/25/2018 0326   KETONESUR 15 (A) 05/25/2018 0326   PROTEINUR 30 (A) 05/25/2018 0326   UROBILINOGEN 2.0 (H) 12/31/2009 1757   NITRITE NEGATIVE 05/25/2018 0326   LEUKOCYTESUR NEGATIVE 05/25/2018 0326   Sepsis Labs Invalid input(s): PROCALCITONIN,  WBC,  LACTICIDVEN Microbiology Recent Results (from the past 240 hour(s))  Culture, blood (routine x 2)     Status: None (Preliminary result)   Collection Time: 01/15/19  2:53 PM   Specimen: BLOOD  Result Value Ref Range  Status   Specimen Description   Final    BLOOD RIGHT ARM Performed at [facility], [place] [address] [place], [place] [address]    Special Requests   Final    BOTTLES DRAWN AEROBIC AND ANAEROBIC Blood Culture adequate volume Performed at [facility] [address], [place], [place] [address]    Culture   Final    NO GROWTH 3 DAYS Performed at [facility], [place] [address], [place], [place] [address]    Report Status PENDING  Incomplete  Culture, blood (routine x 2)  Status: None (Preliminary result)   Collection Time: 01/15/19  2:55 PM   Specimen: BLOOD  Result Value Ref Range Status   Specimen Description   Final    BLOOD LEFT ARM Performed at [facility] [address] [place], [place] [address]    Special Requests   Final    BOTTLES DRAWN AEROBIC AND ANAEROBIC Blood Culture adequate volume Performed at [facility] [address], [place], [place] [address]    Culture   Final    NO GROWTH 3 DAYS Performed at [facility], [place] [address], [place], [place] [address]    Report Status PENDING  Incomplete     Time coordinating discharge: Over 36 minutes  SIGNED:   Hosie Poisson, MD  [facility] 01/18/2019, 5:15 PM

## 2019-01-18 NOTE — Telephone Encounter (Signed)
Patient still in hospital, will schedule office visit with Dr. Lyndel Safe (4-6 weeks out), per Dr. Silverio Decamp,  when she is discharged.

## 2019-01-18 NOTE — TOC Initial Note (Signed)
Transition of Care [facility]) - Initial/Assessment Note    Patient Details  Name: Yolanda Flores MRN: [id number] Date of Birth: Oct 21, 1985  Transition of Care [facility]) CM/SW Contact:    Lia Hopping, [place] Phone Number: 01/18/2019, 1:11 PM  Clinical Narrative:                 PCP arranged for a two week follow up at [facility] and [facility]. Patient given Substance Use resources.   Expected Discharge Plan: Home/Self Care Barriers to Discharge: Continued Medical Work up   Patient Goals and CMS Choice Patient states their goals for this hospitalization and ongoing recovery are:: "quit drinking"   Choice offered to / list presented to : NA  Expected Discharge Plan and Services Expected Discharge Plan: Home/Self Care In-house Referral: Clinical Social Work     Living arrangements for the past 2 months: Single Family Home                                      Prior Living Arrangements/Services Living arrangements for the past 2 months: Single Family Home Lives with:: Self   Do you feel safe going back to the place where you live?: Yes      Need for Family Participation in Patient Care: Yes (Comment) Care giver support system in place?: Yes (comment)   Criminal Activity/Legal Involvement Pertinent to Current Situation/Hospitalization: No - Comment as needed  Activities of Daily Living Home Assistive Devices/Equipment: None ADL Screening (condition at time of admission) Patient's cognitive ability adequate to safely complete daily activities?: Yes Is the patient deaf or have difficulty hearing?: No Does the patient have difficulty seeing, even when wearing glasses/contacts?: No Does the patient have difficulty concentrating, remembering, or making decisions?: No Patient able to express need for assistance with ADLs?: Yes Does the patient have difficulty dressing or bathing?: No Independently performs ADLs?: Yes (appropriate for developmental age) Does the  patient have difficulty walking or climbing stairs?: No Weakness of Legs: None Weakness of Arms/Hands: None  Permission Sought/Granted Permission sought to share information with : Other (comment)([facility] [place]) Permission granted to share information with : Yes, Verbal Permission Granted     Permission granted to share info w AGENCY: [facility]        Emotional Assessment Appearance:: Appears stated age Attitude/Demeanor/Rapport: Engaged Affect (typically observed): Accepting Orientation: : Oriented to Self, Oriented to Place, Oriented to  Time, Oriented to Situation Alcohol / Substance Use: Alcohol Use, Tobacco Use Psych Involvement: No (comment)  Admission diagnosis:  Pancreatitis [K85.90] Acute gallstone pancreatitis [K85.10] Acute pancreatitis, unspecified complication status, unspecified pancreatitis type [K85.90] Patient Active Problem List   Diagnosis Date Noted  . Anemia 01/17/2019  . Thrombocytosis ([place]) 01/17/2019  . Heartburn   . Odynophagia   . Acute renal failure ([place]) 01/08/2019  . Shock circulatory ([place]) 01/08/2019  . Acute gallstone pancreatitis 01/04/2019  . Pancreatitis 01/04/2019  . Chest pain 05/24/2018  . HTN (hypertension) 05/24/2018  . Hypokalemia 05/24/2018  . Lactic acidosis 05/24/2018  . Tobacco abuse 05/24/2018  . Alcohol abuse 05/24/2018  . Abnormal LFTs 05/24/2018  . Prolonged QT interval    PCP:  Patient, No Pcp Per Pharmacy:   [id number], [place] [address] Phone: [phone] Fax: [place],  [place] [address] [place] [address] Phone: [phone] Fax: [phone]     Social Determinants of Health (SDOH) Interventions    Readmission Risk Interventions No flowsheet data found.

## 2019-01-20 ENCOUNTER — Telehealth: Payer: Self-pay

## 2019-01-20 LAB — CULTURE, BLOOD (ROUTINE X 2)
Culture: NO GROWTH
Culture: NO GROWTH
Special Requests: ADEQUATE
Special Requests: ADEQUATE

## 2019-01-20 NOTE — Telephone Encounter (Signed)
Left message to please call back. °

## 2019-01-24 ENCOUNTER — Telehealth: Payer: Self-pay

## 2019-01-24 NOTE — Telephone Encounter (Signed)
Called patient back and gave office visit appt. Message on her voice mail. 02/17/19 at 3:30pm with Amy Esterwood PA. Asked her to call back if any questions

## 2019-01-24 NOTE — Telephone Encounter (Signed)
See phone note

## 2019-01-24 NOTE — Telephone Encounter (Signed)
-----   Message from Mauri Pole, MD sent at 01/24/2019 12:40 PM EST ----- Okay to schedule with me or extender in 4 weeks.  Thanks ----- Message ----- From: Hughie Closs, RN Sent: 01/24/2019  11:11 AM EST To: Mauri Pole, MD  Dr Silverio Decamp, I was finally able to reach the patient this morning, and she does not want to go the the [facility] office to see Dr. Lyndel Safe. States she lives in [place], and would like to see one of our Dr's  in [place]. Please advise. Thanks Sherlynn Stalls ----- Message ----- From: Mauri Pole, MD Sent: 01/14/2019  12:51 PM EST To: Noralyn Pick, NP, Hughie Closs, RN  Can you please set her up for office follow up visit with Dr Lyndel Safe in 4-6 weeks, next available appt for ETOH pancreatitis.  Thanks VN

## 2019-01-24 NOTE — Telephone Encounter (Signed)
-----   Message from Mauri Pole, MD sent at 01/14/2019 12:51 PM EST ----- Can you please set her up for office follow up visit with Dr Lyndel Safe in 4-6 weeks, next available appt for ETOH pancreatitis.  Thanks VN

## 2019-02-07 ENCOUNTER — Other Ambulatory Visit: Payer: Self-pay

## 2019-02-07 ENCOUNTER — Ambulatory Visit: Payer: BLUE CROSS/BLUE SHIELD | Attending: Internal Medicine | Admitting: Internal Medicine

## 2019-02-07 ENCOUNTER — Encounter: Payer: Self-pay | Admitting: Internal Medicine

## 2019-02-07 VITALS — BP 140/82 | HR 77 | Temp 99.4°F | Resp 16 | Ht 67.0 in | Wt 163.8 lb

## 2019-02-07 DIAGNOSIS — D649 Anemia, unspecified: Secondary | ICD-10-CM | POA: Diagnosis not present

## 2019-02-07 DIAGNOSIS — N289 Disorder of kidney and ureter, unspecified: Secondary | ICD-10-CM | POA: Diagnosis not present

## 2019-02-07 DIAGNOSIS — F1011 Alcohol abuse, in remission: Secondary | ICD-10-CM

## 2019-02-07 DIAGNOSIS — I10 Essential (primary) hypertension: Secondary | ICD-10-CM

## 2019-02-07 DIAGNOSIS — Z09 Encounter for follow-up examination after completed treatment for conditions other than malignant neoplasm: Secondary | ICD-10-CM

## 2019-02-07 DIAGNOSIS — Z8719 Personal history of other diseases of the digestive system: Secondary | ICD-10-CM

## 2019-02-07 MED ORDER — AMLODIPINE BESYLATE 10 MG PO TABS: 10.0000 mg | ORAL_TABLET | Freq: Every day | ORAL | 6 refills | Status: DC

## 2019-02-07 MED ORDER — HYDRALAZINE HCL 10 MG PO TABS: 20.0000 mg | ORAL_TABLET | Freq: Three times a day (TID) | ORAL | 4 refills | Status: DC

## 2019-02-07 NOTE — Progress Notes (Signed)
Patient ID: Yolanda Flores, female    DOB: [birth year]  MRN: [id number]  CC: Hospitalization Follow-up   Subjective: Yolanda Flores is a 34 y.o. female who presents for hospital follow-up and to establish with me as PCP Her concerns today include:  hypertension, alcohol abuse, tobacco abuse,  Patient hospitalized 01/04/2019-01/18/2019 with severe acute alcoholic pancreatitis that was complicated with severe ATN.  Her creatinine peaked at 10.  By the time of discharge creatinine was down to 1.1.  She also had leukocytosis in the setting of severe pancreatitis.  She also had noted drop in hemoglobin from 12 down to 8 which may have been partially dilutional.  She was discharged with iron supplement.  Today: EtOH pancreatitis: Since discharge she has not had any abdominal pains.  She states that she has not drank since 01/02/2019.  She intends to remain free of alcohol.  She reports increased alcohol consumption after her boyfriend died about a year ago.  She states that she was having a difficult time dealing with the loss.  She denies any cravings at this time.  She quit smoking 05/2018.  ATN: By the time of hospital discharge, kidney function was almost back to baseline.  Anemia: She was discharged on iron supplement twice a day.  She reports compliance.  Menses are regular lasting 3 to 5 days with heavy bleeding the first 1 to 2 days.  HTN: Reports compliance with Norvasc and hydralazine.  She has taken medicines already today.  She limits salt in the foods.  She does not have a blood pressure monitor. Patient Active Problem List   Diagnosis Date Noted  . Anemia 01/17/2019  . Thrombocytosis ([place]) 01/17/2019  . Heartburn   . Odynophagia   . Acute renal failure ([place]) 01/08/2019  . Shock circulatory ([place]) 01/08/2019  . Acute gallstone pancreatitis 01/04/2019  . Pancreatitis 01/04/2019  . Chest pain 05/24/2018  . HTN (hypertension) 05/24/2018  . Hypokalemia 05/24/2018  . Lactic  acidosis 05/24/2018  . Tobacco abuse 05/24/2018  . Alcohol abuse 05/24/2018  . Abnormal LFTs 05/24/2018  . Prolonged QT interval      Current Outpatient Medications on File Prior to Visit  Medication Sig Dispense Refill  . ferrous sulfate 325 (65 FE) MG tablet Take 1 tablet (325 mg total) by mouth 2 (two) times daily with a meal. 60 tablet 3  . folic acid (FOLVITE) 1 MG tablet Take 1 tablet (1 mg total) by mouth daily. 30 tablet 0  . Multiple Vitamin (MULTIVITAMIN WITH MINERALS) TABS tablet Take 1 tablet by mouth daily.    . pantoprazole (PROTONIX) 40 MG tablet Take 1 tablet (40 mg total) by mouth 2 (two) times daily. 60 tablet 0  . polyethylene glycol (MIRALAX / GLYCOLAX) 17 g packet Take 17 g by mouth daily as needed for mild constipation. 14 each 0  . sucralfate (CARAFATE) 1 GM/10ML suspension Take 10 mLs (1 g total) by mouth 4 (four) times daily -  with meals and at bedtime. 420 mL 2  . thiamine 100 MG tablet Take 1 tablet (100 mg total) by mouth daily.     No current facility-administered medications on file prior to visit.    No Known Allergies  Social History   Socioeconomic History  . Marital status: Single    Spouse name: Not on file  . Number of children: Not on file  . Years of education: Not on file  . Highest education level: Not on file  Occupational History  .  Not on file  Tobacco Use  . Smoking status: Former Smoker    Packs/day: 4.00    Types: Cigarettes    Quit date: 05/24/2018    Years since quitting: 0.7  . Smokeless tobacco: Never Used  Substance and Sexual Activity  . Alcohol use: Yes    Comment: occasionally  . Drug use: No  . Sexual activity: Yes  Other Topics Concern  . Not on file  Social History Narrative  . Not on file   Social Determinants of Health   Financial Resource Strain:   . Difficulty of Paying Living Expenses: Not on file  Food Insecurity:   . Worried About Charity fundraiser in the Last Year: Not on file  . Ran Out of Food  in the Last Year: Not on file  Transportation Needs:   . Lack of Transportation (Medical): Not on file  . Lack of Transportation (Non-Medical): Not on file  Physical Activity:   . Days of Exercise per Week: Not on file  . Minutes of Exercise per Session: Not on file  Stress:   . Feeling of Stress : Not on file  Social Connections:   . Frequency of Communication with Friends and Family: Not on file  . Frequency of Social Gatherings with Friends and Family: Not on file  . Attends Religious Services: Not on file  . Active Member of Clubs or Organizations: Not on file  . Attends Archivist Meetings: Not on file  . Marital Status: Not on file  Intimate Partner Violence:   . Fear of Current or Ex-Partner: Not on file  . Emotionally Abused: Not on file  . Physically Abused: Not on file  . Sexually Abused: Not on file    Family History  Problem Relation Age of Onset  . Hypertension Mother   . Hypertension Father   . Hypertension Brother     Past Surgical History:  Procedure Laterality Date  . FOOT SURGERY      ROS: Review of Systems Negative except as stated above  PHYSICAL EXAM: BP 140/82   Pulse 77   Temp 99.4 F (37.4 C) (Oral)   Resp 16   Ht 5\' 7"  (1.702 m)   Wt 163 lb 12.8 oz (74.3 kg)   LMP 01/10/2019   SpO2 100%   BMI 25.65 kg/m   Physical Exam  General appearance - alert, well appearing, and in no distress Mental status - normal mood, behavior, speech, dress, motor activity, and thought processes Neck - supple, no significant adenopathy Chest - clear to auscultation, no wheezes, rales or rhonchi, symmetric air entry Heart - normal rate, regular rhythm, normal S1, S2, no murmurs, rubs, clicks or gallops Extremities - peripheral pulses normal, no pedal edema, no clubbing or cyanosis   CMP Latest Ref Rng & Units 01/17/2019 01/16/2019 01/15/2019  Glucose 70 - 99 mg/dL 113(H) 92 93  BUN 6 - 20 mg/dL 8 13 26(H)  Creatinine 0.44 - 1.00 mg/dL 0.99 1.10(H)  1.25(H)  Sodium 135 - 145 mmol/L 137 136 138  Potassium 3.5 - 5.1 mmol/L 3.5 3.5 3.1(L)  Chloride 98 - 111 mmol/L 99 99 100  CO2 22 - 32 mmol/L 28 26 24   Calcium 8.9 - 10.3 mg/dL 8.2(L) 8.1(L) 8.8(L)  Total Protein 6.5 - 8.1 g/dL - 6.9 7.1  Total Bilirubin 0.3 - 1.2 mg/dL - 0.7 0.8  Alkaline Phos 38 - 126 U/L - 75 89  AST 15 - 41 U/L - 18 17  ALT 0 - 44 U/L - 12 15   Lipid Panel     Component Value Date/Time   CHOL 389 (H) 05/25/2018 0403   TRIG 274 (H) 01/14/2019 1018   HDL >135 05/25/2018 0403   CHOLHDL NOT CALCULATED 05/25/2018 0403   VLDL 15 05/25/2018 0403   LDLCALC NOT CALCULATED 05/25/2018 0403    CBC    Component Value Date/Time   WBC 16.0 (H) 01/17/2019 1218   RBC 2.63 (L) 01/17/2019 1218   HGB 8.5 (L) 01/17/2019 1218   HCT 25.8 (L) 01/17/2019 1218   PLT 593 (H) 01/17/2019 1218   MCV 98.1 01/17/2019 1218   MCH 32.3 01/17/2019 1218   MCHC 32.9 01/17/2019 1218   RDW 15.0 01/17/2019 1218   LYMPHSABS 1.9 01/17/2019 1218   MONOABS 1.2 (H) 01/17/2019 1218   EOSABS 0.3 01/17/2019 1218   BASOSABS 0.1 01/17/2019 1218   Lab Results  Component Value Date   IRON 27 (L) 01/07/2019   TIBC 233 (L) 01/07/2019   FERRITIN 593 (H) 01/07/2019    ASSESSMENT AND PLAN: 1. Hospital discharge follow-up 2. History of acute pancreatitis 3. Alcohol use disorder, mild, in early remission Symptoms of pancreatitis have resolved.  Commended her on quitting alcohol.  Encouraged her to remain alcohol free.  She tells me that she intends to do so.  She does not feel that she need any treatment program at this time.  4. Normocytic anemia We will recheck a CBC and based on results determine whether she needs to remain on iron or not.  Iron studies are more consistent with ACD with elevated ferritin likely as an inflammatory marker during acute pancreatitis - CBC; Future  5. Essential hypertension Not at goal.  Continue low-salt diet.  Increase hydralazine to 20 mg.  Follow-up with  clinical pharmacist in 1 month for repeat blood pressure check.  - amLODipine (NORVASC) 10 MG tablet; Take 1 tablet (10 mg total) by mouth daily.  Dispense: 30 tablet; Refill: 6 - hydrALAZINE (APRESOLINE) 10 MG tablet; Take 2 tablets (20 mg total) by mouth every 8 (eight) hours.  Dispense: 180 tablet; Refill: 4  6. Renal insufficiency - Comprehensive metabolic panel; Future   Patient was given the opportunity to ask questions.  Patient verbalized understanding of the plan and was able to repeat key elements of the plan.   Orders Placed This Encounter  Procedures  . CBC  . Comprehensive metabolic panel     Requested Prescriptions   Signed Prescriptions Disp Refills  . amLODipine (NORVASC) 10 MG tablet 30 tablet 6    Sig: Take 1 tablet (10 mg total) by mouth daily.  . hydrALAZINE (APRESOLINE) 10 MG tablet 180 tablet 4    Sig: Take 2 tablets (20 mg total) by mouth every 8 (eight) hours.    Return in about 3 months (around 05/08/2019).  Karle Plumber, MD, FACP

## 2019-02-07 NOTE — Patient Instructions (Signed)
Please give patient an appointment with the clinical pharmacist in 1 month for repeat blood pressure check.

## 2019-02-16 ENCOUNTER — Telehealth: Payer: Self-pay | Admitting: Physician Assistant

## 2019-02-16 NOTE — Telephone Encounter (Signed)
Patient called to check if we take her insurance and we do not participate with [employer].

## 2019-02-17 ENCOUNTER — Ambulatory Visit (INDEPENDENT_AMBULATORY_CARE_PROVIDER_SITE_OTHER): Payer: Self-pay | Admitting: Physician Assistant

## 2019-02-17 ENCOUNTER — Encounter: Payer: Self-pay | Admitting: Physician Assistant

## 2019-02-17 ENCOUNTER — Ambulatory Visit: Payer: BLUE CROSS/BLUE SHIELD | Admitting: Physician Assistant

## 2019-02-17 ENCOUNTER — Other Ambulatory Visit: Payer: Self-pay

## 2019-02-17 ENCOUNTER — Other Ambulatory Visit (INDEPENDENT_AMBULATORY_CARE_PROVIDER_SITE_OTHER): Payer: BLUE CROSS/BLUE SHIELD

## 2019-02-17 VITALS — BP 120/66 | HR 60 | Temp 98.2°F | Ht 67.0 in | Wt 164.1 lb

## 2019-02-17 DIAGNOSIS — D509 Iron deficiency anemia, unspecified: Secondary | ICD-10-CM

## 2019-02-17 DIAGNOSIS — K859 Acute pancreatitis without necrosis or infection, unspecified: Secondary | ICD-10-CM

## 2019-02-17 LAB — CBC WITH DIFFERENTIAL/PLATELET
Basophils Absolute: 0.1 10*3/uL (ref 0.0–0.1)
Basophils Relative: 0.7 % (ref 0.0–3.0)
Eosinophils Absolute: 0.4 10*3/uL (ref 0.0–0.7)
Eosinophils Relative: 4.6 % (ref 0.0–5.0)
HCT: 31.2 % — ABNORMAL LOW (ref 36.0–46.0)
Hemoglobin: 10.3 g/dL — ABNORMAL LOW (ref 12.0–15.0)
Lymphocytes Relative: 19 % (ref 12.0–46.0)
Lymphs Abs: 1.6 10*3/uL (ref 0.7–4.0)
MCHC: 33 g/dL (ref 30.0–36.0)
MCV: 92.3 fl (ref 78.0–100.0)
Monocytes Absolute: 0.8 10*3/uL (ref 0.1–1.0)
Monocytes Relative: 9.1 % (ref 3.0–12.0)
Neutro Abs: 5.6 10*3/uL (ref 1.4–7.7)
Neutrophils Relative %: 66.6 % (ref 43.0–77.0)
Platelets: 697 10*3/uL — ABNORMAL HIGH (ref 150.0–400.0)
RBC: 3.38 Mil/uL — ABNORMAL LOW (ref 3.87–5.11)
RDW: 14.6 % (ref 11.5–15.5)
WBC: 8.4 10*3/uL (ref 4.0–10.5)

## 2019-02-17 LAB — LIPASE: Lipase: 18 U/L (ref 11.0–59.0)

## 2019-02-17 MED ORDER — PANTOPRAZOLE SODIUM 40 MG PO TBEC: 40.0000 mg | DELAYED_RELEASE_TABLET | Freq: Every day | ORAL | 0 refills | Status: DC

## 2019-02-17 NOTE — Patient Instructions (Signed)
Your provider has requested that you go to the basement level for lab work before leaving today. Press "B" on the elevator. The lab is located at the first door on the left as you exit the elevator.   Continue iron twice daily.   Decrease pantoprazole 40 mg once daily x 1 month then stop. A prescription has been sent to your pharmacy.   Avoid alcohol.   Call our office if any recurrent symptoms.

## 2019-02-17 NOTE — Progress Notes (Signed)
Subjective:    Patient ID: Yolanda Flores, female    DOB: April 23, 1985, 34 y.o.   MRN: [id number]  HPI Yolanda Flores is a pleasant 34 year old African-American female, who comes in today for post hospital follow-up after recent admission 01/04/2019 through 01/17/2018 with acute pancreatitis complicated by SIRS and Acute Kidney Injury.  Pancreatitis felt to be EtOH induced. She was seen in consultation by Dr. Lyndel Safe. Abdominal ultrasound was unremarkable, no gallstones or sludge MRI/MRCP on 01/05/2019 showed mild hepatic steatosis, mild gallbladder sludge, no gallstones or gallbladder wall thickening there is mild pericholecystic fluid edema related to peripancreatic inflammatory process.  There is moderate peripancreatic fluid ascites, pancreatic necrosis could not be assessed due to absence of IV contrast.  CT of the abdomen pelvis 1 week later on 01/13/2019 showed persistent and progressive changes of acute pancreatitis evaluation for pseudocyst or necrosis limited due to lack of IV contrast was felt to have phlegmon formation in the upper abdomen demonstrated by mass-effect on the posterior wall of the gastric antrum and had small bilateral effusions. Last labs were done on 01/16/2018 showing lipase of 81 WBC 16,000 hemoglobin 8.5 LFTs were normal creatinine 1.1.  Patient says she has been doing very well since discharge from the hospital at this time feels much better.  She has not had any residual abdominal pain, no nausea or vomiting, no fevers.  She is eating without difficulty.  She was started on iron supplementation during hospitalization due to anemia and borderline iron deficiency and says her stools have been very dark and she has had some mild constipation.  Patient does not feel that she has a problem with alcohol, she had been drinking fairly heavily for short period time after the death of a close friend.  She says she has not had any alcohol at all since admission to the hospital.  Review  of Systems Pertinent positive and negative review of systems were noted in the above HPI section.  All other review of systems was otherwise negative.  Outpatient Encounter Medications as of 02/17/2019  Medication Sig  . amLODipine (NORVASC) 10 MG tablet Take 1 tablet (10 mg total) by mouth daily.  . ferrous sulfate 325 (65 FE) MG tablet Take 1 tablet (325 mg total) by mouth 2 (two) times daily with a meal.  . folic acid (FOLVITE) 1 MG tablet Take 1 tablet (1 mg total) by mouth daily.  . hydrALAZINE (APRESOLINE) 10 MG tablet Take 2 tablets (20 mg total) by mouth every 8 (eight) hours.  . Multiple Vitamin (MULTIVITAMIN WITH MINERALS) TABS tablet Take 1 tablet by mouth daily.  . pantoprazole (PROTONIX) 40 MG tablet Take 1 tablet (40 mg total) by mouth daily.  . polyethylene glycol (MIRALAX / GLYCOLAX) 17 g packet Take 17 g by mouth daily as needed for mild constipation.  . sucralfate (CARAFATE) 1 GM/10ML suspension Take 10 mLs (1 g total) by mouth 4 (four) times daily -  with meals and at bedtime.  . thiamine 100 MG tablet Take 1 tablet (100 mg total) by mouth daily.  . [DISCONTINUED] pantoprazole (PROTONIX) 40 MG tablet Take 1 tablet (40 mg total) by mouth 2 (two) times daily.   No facility-administered encounter medications on file as of 02/17/2019.   No Known Allergies Patient Active Problem List   Diagnosis Date Noted  . Normocytic anemia 01/17/2019  . Thrombocytosis ([place]) 01/17/2019  . Heartburn   . Odynophagia   . Acute renal failure ([place]) 01/08/2019  . Shock circulatory ([place]) 01/08/2019  .  Acute gallstone pancreatitis 01/04/2019  . Pancreatitis 01/04/2019  . Chest pain 05/24/2018  . HTN (hypertension) 05/24/2018  . Hypokalemia 05/24/2018  . Lactic acidosis 05/24/2018  . Tobacco abuse 05/24/2018  . Alcohol use disorder, mild, in early remission 05/24/2018  . Abnormal LFTs 05/24/2018  . Prolonged QT interval    Social History   Socioeconomic History  . Marital status: Single     Spouse name: Not on file  . Number of children: Not on file  . Years of education: Not on file  . Highest education level: Not on file  Occupational History  . Not on file  Tobacco Use  . Smoking status: Former Smoker    Packs/day: 4.00    Types: Cigarettes    Quit date: 05/24/2018    Years since quitting: 0.7  . Smokeless tobacco: Never Used  Substance and Sexual Activity  . Alcohol use: Yes    Comment: occasionally  . Drug use: No  . Sexual activity: Yes  Other Topics Concern  . Not on file  Social History Narrative  . Not on file   Social Determinants of Health   Financial Resource Strain:   . Difficulty of Paying Living Expenses: Not on file  Food Insecurity:   . Worried About Charity fundraiser in the Last Year: Not on file  . Ran Out of Food in the Last Year: Not on file  Transportation Needs:   . Lack of Transportation (Medical): Not on file  . Lack of Transportation (Non-Medical): Not on file  Physical Activity:   . Days of Exercise per Week: Not on file  . Minutes of Exercise per Session: Not on file  Stress:   . Feeling of Stress : Not on file  Social Connections:   . Frequency of Communication with Friends and Family: Not on file  . Frequency of Social Gatherings with Friends and Family: Not on file  . Attends Religious Services: Not on file  . Active Member of Clubs or Organizations: Not on file  . Attends Archivist Meetings: Not on file  . Marital Status: Not on file  Intimate Partner Violence:   . Fear of Current or Ex-Partner: Not on file  . Emotionally Abused: Not on file  . Physically Abused: Not on file  . Sexually Abused: Not on file    Yolanda Flores's family history includes Hypertension in her brother, father, and mother.      Objective:    Vitals:   02/17/19 1516  BP: 120/66  Pulse: 60  Temp: 98.2 F (36.8 C)    Physical Exam Well-developed well-nourished young African-American female in no acute distress.  Height, Weight  164, BMI 25.7  HEENT; nontraumatic normocephalic, EOMI, PER R LA, sclera anicteric. Oropharynx; not done Neck; supple, no JVD Cardiovascular; regular rate and rhythm with S1-S2, no murmur rub or gallop Pulmonary; Clear bilaterally Abdomen; soft, nontender, nondistended, no palpable mass or hepatosplenomegaly, bowel sounds are active Rectal; not done today Skin; benign exam, no jaundice rash or appreciable lesions Extremities; no clubbing cyanosis or edema skin warm and dry Neuro/Psych; alert and oriented x4, grossly nonfocal mood and affect appropriate       Assessment & Plan:   #29 34 year old African-American female here for post hospital follow-up after recent hospitalization for acute severe EtOH induced pancreatitis with SIRS and acute kidney injury. Most recent CT imaging on 01/13/2019 showed persistent acute pancreatitis and phlegmon formation with mass-effect on the posterior wall of the stomach.  Patient is doing very well over the past month with no residual abdominal pain, no issues with eating or nausea.  #2 anemia acute on chronic with borderline iron deficiency.  Patient did have a significant drop in hemoglobin during hospitalization due to the acute severe pancreatitis. She also relates heavy menses and may have a component of iron deficiency secondary to menorrhagia.  Plan; she has been on twice daily Protonix, will decrease to once daily x1 month then discontinue Stop Carafate Continue MiraLAX 17 g daily in 8 ounces of water as needed for constipation. Continue ferrous sulfate 325 mg twice daily x3 months. CBC, c-Met and lipase today. Patient advised to continue to avoid alcohol altogether She will need follow-up iron studies in 3 months. Will arrange follow-up pending results of today's labs. Patient established with Dr. Gupta.  Amy S Esterwood PA-C 02/17/2019   Cc: Johnson, Deborah B, MD  

## 2019-03-10 ENCOUNTER — Ambulatory Visit: Payer: BLUE CROSS/BLUE SHIELD | Admitting: Pharmacist

## 2019-03-22 ENCOUNTER — Other Ambulatory Visit: Payer: Self-pay | Admitting: Physician Assistant

## 2019-04-26 ENCOUNTER — Other Ambulatory Visit: Payer: Self-pay | Admitting: Physician Assistant

## 2019-05-09 ENCOUNTER — Other Ambulatory Visit: Payer: Self-pay

## 2019-05-12 ENCOUNTER — Ambulatory Visit: Payer: BLUE CROSS/BLUE SHIELD | Attending: Internal Medicine | Admitting: Internal Medicine

## 2019-05-12 ENCOUNTER — Other Ambulatory Visit: Payer: Self-pay

## 2019-12-24 ENCOUNTER — Encounter (HOSPITAL_COMMUNITY): Payer: Self-pay | Admitting: *Deleted

## 2019-12-24 ENCOUNTER — Inpatient Hospital Stay (HOSPITAL_COMMUNITY): Payer: BLUE CROSS/BLUE SHIELD

## 2019-12-24 ENCOUNTER — Emergency Department (HOSPITAL_COMMUNITY): Payer: BLUE CROSS/BLUE SHIELD

## 2019-12-24 ENCOUNTER — Inpatient Hospital Stay (HOSPITAL_COMMUNITY)
Admission: EM | Admit: 2019-12-24 | Discharge: 2019-12-28 | DRG: 438 | Disposition: A | Discharge status: Home / Self Care | Payer: BLUE CROSS/BLUE SHIELD | Attending: Internal Medicine | Admitting: Internal Medicine

## 2019-12-24 ENCOUNTER — Other Ambulatory Visit: Payer: Self-pay

## 2019-12-24 DIAGNOSIS — R109 Unspecified abdominal pain: Secondary | ICD-10-CM

## 2019-12-24 DIAGNOSIS — N179 Acute kidney failure, unspecified: Secondary | ICD-10-CM | POA: Diagnosis present

## 2019-12-24 DIAGNOSIS — G9340 Encephalopathy, unspecified: Secondary | ICD-10-CM

## 2019-12-24 DIAGNOSIS — Z20822 Contact with and (suspected) exposure to covid-19: Secondary | ICD-10-CM | POA: Diagnosis present

## 2019-12-24 DIAGNOSIS — E861 Hypovolemia: Secondary | ICD-10-CM | POA: Diagnosis present

## 2019-12-24 DIAGNOSIS — E7220 Disorder of urea cycle metabolism, unspecified: Secondary | ICD-10-CM | POA: Diagnosis present

## 2019-12-24 DIAGNOSIS — T402X5A Adverse effect of other opioids, initial encounter: Secondary | ICD-10-CM | POA: Diagnosis not present

## 2019-12-24 DIAGNOSIS — E871 Hypo-osmolality and hyponatremia: Secondary | ICD-10-CM | POA: Diagnosis present

## 2019-12-24 DIAGNOSIS — G929 Unspecified toxic encephalopathy: Secondary | ICD-10-CM | POA: Diagnosis not present

## 2019-12-24 DIAGNOSIS — F1011 Alcohol abuse, in remission: Secondary | ICD-10-CM | POA: Diagnosis present

## 2019-12-24 DIAGNOSIS — R7401 Elevation of levels of liver transaminase levels: Secondary | ICD-10-CM

## 2019-12-24 DIAGNOSIS — D539 Nutritional anemia, unspecified: Secondary | ICD-10-CM | POA: Diagnosis present

## 2019-12-24 DIAGNOSIS — E872 Acidosis: Secondary | ICD-10-CM | POA: Diagnosis present

## 2019-12-24 DIAGNOSIS — K760 Fatty (change of) liver, not elsewhere classified: Secondary | ICD-10-CM | POA: Diagnosis present

## 2019-12-24 DIAGNOSIS — D696 Thrombocytopenia, unspecified: Secondary | ICD-10-CM | POA: Diagnosis present

## 2019-12-24 DIAGNOSIS — Z8249 Family history of ischemic heart disease and other diseases of the circulatory system: Secondary | ICD-10-CM

## 2019-12-24 DIAGNOSIS — G9341 Metabolic encephalopathy: Secondary | ICD-10-CM | POA: Diagnosis present

## 2019-12-24 DIAGNOSIS — R739 Hyperglycemia, unspecified: Secondary | ICD-10-CM | POA: Diagnosis present

## 2019-12-24 DIAGNOSIS — R6511 Systemic inflammatory response syndrome (SIRS) of non-infectious origin with acute organ dysfunction: Secondary | ICD-10-CM | POA: Diagnosis present

## 2019-12-24 DIAGNOSIS — K219 Gastro-esophageal reflux disease without esophagitis: Secondary | ICD-10-CM | POA: Diagnosis present

## 2019-12-24 DIAGNOSIS — Z87891 Personal history of nicotine dependence: Secondary | ICD-10-CM

## 2019-12-24 DIAGNOSIS — K859 Acute pancreatitis without necrosis or infection, unspecified: Principal | ICD-10-CM | POA: Diagnosis present

## 2019-12-24 DIAGNOSIS — R7989 Other specified abnormal findings of blood chemistry: Secondary | ICD-10-CM | POA: Diagnosis not present

## 2019-12-24 DIAGNOSIS — F1010 Alcohol abuse, uncomplicated: Secondary | ICD-10-CM | POA: Diagnosis present

## 2019-12-24 DIAGNOSIS — K759 Inflammatory liver disease, unspecified: Secondary | ICD-10-CM

## 2019-12-24 DIAGNOSIS — F10930 Alcohol use, unspecified with withdrawal, uncomplicated: Secondary | ICD-10-CM

## 2019-12-24 DIAGNOSIS — I10 Essential (primary) hypertension: Secondary | ICD-10-CM | POA: Diagnosis present

## 2019-12-24 DIAGNOSIS — Z833 Family history of diabetes mellitus: Secondary | ICD-10-CM

## 2019-12-24 DIAGNOSIS — E876 Hypokalemia: Secondary | ICD-10-CM | POA: Diagnosis not present

## 2019-12-24 DIAGNOSIS — K851 Biliary acute pancreatitis without necrosis or infection: Secondary | ICD-10-CM | POA: Diagnosis not present

## 2019-12-24 DIAGNOSIS — R9431 Abnormal electrocardiogram [ECG] [EKG]: Secondary | ICD-10-CM | POA: Diagnosis present

## 2019-12-24 DIAGNOSIS — K7010 Alcoholic hepatitis without ascites: Secondary | ICD-10-CM | POA: Diagnosis present

## 2019-12-24 DIAGNOSIS — E878 Other disorders of electrolyte and fluid balance, not elsewhere classified: Secondary | ICD-10-CM | POA: Diagnosis present

## 2019-12-24 DIAGNOSIS — K8500 Idiopathic acute pancreatitis without necrosis or infection: Secondary | ICD-10-CM | POA: Diagnosis not present

## 2019-12-24 DIAGNOSIS — R945 Abnormal results of liver function studies: Secondary | ICD-10-CM | POA: Diagnosis not present

## 2019-12-24 DIAGNOSIS — K828 Other specified diseases of gallbladder: Secondary | ICD-10-CM | POA: Diagnosis present

## 2019-12-24 HISTORY — DX: Acute kidney failure, unspecified: N17.9

## 2019-12-24 HISTORY — DX: Acute pancreatitis without necrosis or infection, unspecified: K85.90

## 2019-12-24 LAB — CBC
HCT: 38.4 % (ref 36.0–46.0)
Hemoglobin: 12.5 g/dL (ref 12.0–15.0)
MCH: 34.4 pg — ABNORMAL HIGH (ref 26.0–34.0)
MCHC: 32.6 g/dL (ref 30.0–36.0)
MCV: 105.8 fL — ABNORMAL HIGH (ref 80.0–100.0)
Platelets: 117 10*3/uL — ABNORMAL LOW (ref 150–400)
RBC: 3.63 MIL/uL — ABNORMAL LOW (ref 3.87–5.11)
RDW: 13.5 % (ref 11.5–15.5)
WBC: 6.4 10*3/uL (ref 4.0–10.5)
nRBC: 0.3 % — ABNORMAL HIGH (ref 0.0–0.2)

## 2019-12-24 LAB — BASIC METABOLIC PANEL
Anion gap: 23 — ABNORMAL HIGH (ref 5–15)
Anion gap: 20 — ABNORMAL HIGH (ref 5–15)
BUN: 17 mg/dL (ref 6–20)
BUN: 17 mg/dL (ref 6–20)
CO2: 9 mmol/L — ABNORMAL LOW (ref 22–32)
CO2: 16 mmol/L — ABNORMAL LOW (ref 22–32)
Calcium: 6.4 mg/dL — CL (ref 8.9–10.3)
Calcium: 7.5 mg/dL — ABNORMAL LOW (ref 8.9–10.3)
Chloride: 104 mmol/L (ref 98–111)
Chloride: 97 mmol/L — ABNORMAL LOW (ref 98–111)
Creatinine, Ser: 2.01 mg/dL — ABNORMAL HIGH (ref 0.44–1.00)
Creatinine, Ser: 1.61 mg/dL — ABNORMAL HIGH (ref 0.44–1.00)
GFR, Estimated: 33 mL/min — ABNORMAL LOW (ref >60)
GFR, Estimated: 44 mL/min — ABNORMAL LOW (ref >60)
Glucose, Bld: 123 mg/dL — ABNORMAL HIGH (ref 70–99)
Glucose, Bld: 275 mg/dL — ABNORMAL HIGH (ref 70–99)
Potassium: 4.4 mmol/L (ref 3.5–5.1)
Potassium: 4.1 mmol/L (ref 3.5–5.1)
Sodium: 136 mmol/L (ref 135–145)
Sodium: 133 mmol/L — ABNORMAL LOW (ref 135–145)

## 2019-12-24 LAB — I-STAT BETA HCG BLOOD, ED (MC, WL, AP ONLY)
I-stat hCG, quantitative: <5 m[IU]/mL (ref <5)
I-stat hCG, quantitative: <5 m[IU]/mL (ref <5)

## 2019-12-24 LAB — URINALYSIS, ROUTINE W REFLEX MICROSCOPIC
Bilirubin Urine: NEGATIVE
Glucose, UA: NEGATIVE mg/dL
Ketones, ur: 80 mg/dL — AB
Nitrite: NEGATIVE
Protein, ur: >=300 mg/dL — AB
Specific Gravity, Urine: 1.01 (ref 1.005–1.030)
pH: 6 (ref 5.0–8.0)

## 2019-12-24 LAB — COMPREHENSIVE METABOLIC PANEL
ALT: 221 U/L — ABNORMAL HIGH (ref 0–44)
AST: 1634 U/L — ABNORMAL HIGH (ref 15–41)
Albumin: 4.9 g/dL (ref 3.5–5.0)
Alkaline Phosphatase: 112 U/L (ref 38–126)
Anion gap: 32 — ABNORMAL HIGH (ref 5–15)
BUN: 18 mg/dL (ref 6–20)
CO2: 7 mmol/L — ABNORMAL LOW (ref 22–32)
Calcium: 8.3 mg/dL — ABNORMAL LOW (ref 8.9–10.3)
Chloride: 93 mmol/L — ABNORMAL LOW (ref 98–111)
Creatinine, Ser: 2.55 mg/dL — ABNORMAL HIGH (ref 0.44–1.00)
GFR, Estimated: 25 mL/min — ABNORMAL LOW (ref >60)
Glucose, Bld: 140 mg/dL — ABNORMAL HIGH (ref 70–99)
Potassium: 5.1 mmol/L (ref 3.5–5.1)
Sodium: 132 mmol/L — ABNORMAL LOW (ref 135–145)
Total Bilirubin: 3.9 mg/dL — ABNORMAL HIGH (ref 0.3–1.2)
Total Protein: 9.1 g/dL — ABNORMAL HIGH (ref 6.5–8.1)

## 2019-12-24 LAB — ETHANOL: Alcohol, Ethyl (B): <10 mg/dL (ref <10)

## 2019-12-24 LAB — AMMONIA: Ammonia: 107 umol/L — ABNORMAL HIGH (ref 9–35)

## 2019-12-24 LAB — MRSA PCR SCREENING: MRSA by PCR: NEGATIVE

## 2019-12-24 LAB — PROTIME-INR
INR: 1.1 (ref 0.8–1.2)
Prothrombin Time: 13.6 seconds (ref 11.4–15.2)

## 2019-12-24 LAB — RAPID URINE DRUG SCREEN, HOSP PERFORMED
Amphetamines: NOT DETECTED
Barbiturates: NOT DETECTED
Benzodiazepines: NOT DETECTED
Cocaine: NOT DETECTED
Opiates: POSITIVE — AB
Tetrahydrocannabinol: NOT DETECTED

## 2019-12-24 LAB — RESP PANEL BY RT-PCR (FLU A&B, COVID) ARPGX2
Influenza A by PCR: NEGATIVE
Influenza B by PCR: NEGATIVE
SARS Coronavirus 2 by RT PCR: NEGATIVE

## 2019-12-24 LAB — TRIGLYCERIDES: Triglycerides: 758 mg/dL — ABNORMAL HIGH (ref <150)

## 2019-12-24 LAB — LACTIC ACID, PLASMA
Lactic Acid, Venous: 1.1 mmol/L (ref 0.5–1.9)
Lactic Acid, Venous: 1.9 mmol/L (ref 0.5–1.9)
Lactic Acid, Venous: 2.1 mmol/L (ref 0.5–1.9)
Lactic Acid, Venous: 2.3 mmol/L (ref 0.5–1.9)

## 2019-12-24 LAB — GLUCOSE, CAPILLARY: Glucose-Capillary: 229 mg/dL — ABNORMAL HIGH (ref 70–99)

## 2019-12-24 LAB — LIPASE, BLOOD
Lipase: 1184 U/L — ABNORMAL HIGH (ref 11–51)
Lipase: 1074 U/L — ABNORMAL HIGH (ref 11–51)

## 2019-12-24 MED ORDER — THIAMINE HCL 100 MG/ML IJ SOLN
100.0000 mg | Freq: Every day | INTRAMUSCULAR | Status: DC
  Administered 2019-12-24 – 2019-12-25 (×2): 100 mg via INTRAVENOUS
  Filled 2019-12-24 (×2): qty 2

## 2019-12-24 MED ORDER — FENTANYL CITRATE (PF) 100 MCG/2ML IJ SOLN: 25.0000 ug | INTRAMUSCULAR | Status: DC | PRN

## 2019-12-24 MED ORDER — MORPHINE SULFATE (PF) 2 MG/ML IV SOLN
2.0000 mg | Freq: Once | INTRAVENOUS | Status: AC
  Administered 2019-12-24: 15:00:00 2 mg via INTRAVENOUS
  Filled 2019-12-24: qty 1

## 2019-12-24 MED ORDER — LORAZEPAM 1 MG PO TABS
1.0000 mg | ORAL_TABLET | ORAL | Status: AC | PRN
Start: 2019-12-24 — End: 2019-12-27

## 2019-12-24 MED ORDER — M.V.I. ADULT IV INJ
Freq: Once | INTRAVENOUS | Status: AC
  Filled 2019-12-24: qty 5

## 2019-12-24 MED ORDER — CHLORHEXIDINE GLUCONATE CLOTH 2 % EX PADS
6.0000 | MEDICATED_PAD | Freq: Every day | CUTANEOUS | Status: DC
  Administered 2019-12-24 – 2019-12-28 (×5): 6 via TOPICAL

## 2019-12-24 MED ORDER — SODIUM CHLORIDE 0.9 % IV BOLUS
1000.0000 mL | Freq: Once | INTRAVENOUS | Status: AC
  Administered 2019-12-24: 1000 mL via INTRAVENOUS

## 2019-12-24 MED ORDER — SODIUM CHLORIDE 0.9 % IV BOLUS
1500.0000 mL | Freq: Once | INTRAVENOUS | Status: AC
  Administered 2019-12-24: 15:00:00 1500 mL via INTRAVENOUS

## 2019-12-24 MED ORDER — ORAL CARE MOUTH RINSE
15.0000 mL | Freq: Two times a day (BID) | OROMUCOSAL | Status: DC
  Administered 2019-12-24 – 2019-12-27 (×3): 15 mL via OROMUCOSAL

## 2019-12-24 MED ORDER — FOLIC ACID 5 MG/ML IJ SOLN
1.0000 mg | Freq: Every day | INTRAMUSCULAR | Status: DC
  Administered 2019-12-24 – 2019-12-25 (×2): 1 mg via INTRAVENOUS
  Filled 2019-12-24 (×3): qty 0.2

## 2019-12-24 MED ORDER — LIP MEDEX EX OINT
TOPICAL_OINTMENT | CUTANEOUS | Status: DC | PRN
  Filled 2019-12-24: qty 7

## 2019-12-24 MED ORDER — CALCIUM GLUCONATE-NACL 1-0.675 GM/50ML-% IV SOLN
1.0000 g | Freq: Once | INTRAVENOUS | Status: AC
  Administered 2019-12-24: 19:00:00 1000 mg via INTRAVENOUS
  Filled 2019-12-24: qty 50

## 2019-12-24 MED ORDER — HEPARIN SODIUM (PORCINE) 5000 UNIT/ML IJ SOLN
5000.0000 [IU] | Freq: Three times a day (TID) | INTRAMUSCULAR | Status: DC
  Administered 2019-12-24 – 2019-12-28 (×11): 5000 [IU] via SUBCUTANEOUS
  Filled 2019-12-24 (×11): qty 1

## 2019-12-24 MED ORDER — HYDROMORPHONE HCL 1 MG/ML IJ SOLN
0.5000 mg | INTRAMUSCULAR | Status: DC | PRN
  Administered 2019-12-25 – 2019-12-28 (×10): 0.5 mg via INTRAVENOUS
  Filled 2019-12-24: qty 1
  Filled 2019-12-24: qty 0.5
  Filled 2019-12-24 (×3): qty 1
  Filled 2019-12-24 (×2): qty 0.5
  Filled 2019-12-24 (×2): qty 1
  Filled 2019-12-24: qty 0.5

## 2019-12-24 MED ORDER — LORAZEPAM 2 MG/ML IJ SOLN
1.0000 mg | Freq: Once | INTRAMUSCULAR | Status: AC
  Administered 2019-12-24: 16:00:00 1 mg via INTRAVENOUS
  Filled 2019-12-24: qty 1

## 2019-12-24 MED ORDER — PROMETHAZINE HCL 25 MG/ML IJ SOLN
6.2500 mg | Freq: Once | INTRAMUSCULAR | Status: AC
  Administered 2019-12-24: 6.25 mg via INTRAVENOUS
  Filled 2019-12-24: qty 1

## 2019-12-24 MED ORDER — PANTOPRAZOLE SODIUM 40 MG IV SOLR
40.0000 mg | Freq: Every day | INTRAVENOUS | Status: DC
  Administered 2019-12-24 – 2019-12-26 (×3): 40 mg via INTRAVENOUS
  Filled 2019-12-24 (×3): qty 40

## 2019-12-24 MED ORDER — MORPHINE SULFATE (PF) 2 MG/ML IV SOLN
2.0000 mg | Freq: Once | INTRAVENOUS | Status: AC
  Administered 2019-12-24: 2 mg via INTRAVENOUS
  Filled 2019-12-24: qty 1

## 2019-12-24 MED ORDER — SODIUM CHLORIDE 0.9 % IV BOLUS
1000.0000 mL | Freq: Once | INTRAVENOUS | Status: AC
  Administered 2019-12-25: 1000 mL via INTRAVENOUS

## 2019-12-24 MED ORDER — LORAZEPAM 2 MG/ML IJ SOLN: 1.0000 mg | INTRAMUSCULAR | Status: AC | PRN

## 2019-12-24 MED ORDER — CHLORHEXIDINE GLUCONATE 0.12 % MT SOLN
15.0000 mL | Freq: Two times a day (BID) | OROMUCOSAL | Status: DC
  Administered 2019-12-24 – 2019-12-28 (×7): 15 mL via OROMUCOSAL
  Filled 2019-12-24 (×7): qty 15

## 2019-12-24 MED ORDER — LACTATED RINGERS IV BOLUS
1000.0000 mL | Freq: Once | INTRAVENOUS | Status: AC
  Administered 2019-12-24: 18:00:00 1000 mL via INTRAVENOUS

## 2019-12-24 MED ORDER — STERILE WATER FOR INJECTION IV SOLN
INTRAVENOUS | Status: AC
  Filled 2019-12-24 (×3): qty 150

## 2019-12-24 MED ORDER — INSULIN ASPART 100 UNIT/ML ~~LOC~~ SOLN
1.0000 [IU] | SUBCUTANEOUS | Status: DC
  Administered 2019-12-24: 3 [IU] via SUBCUTANEOUS
  Administered 2019-12-24: 18:00:00 1 [IU] via SUBCUTANEOUS
  Administered 2019-12-24: 21:00:00 2 [IU] via SUBCUTANEOUS
  Administered 2019-12-25: 05:00:00 1 [IU] via SUBCUTANEOUS
  Administered 2019-12-25: 12:00:00 2 [IU] via SUBCUTANEOUS
  Administered 2019-12-25 – 2019-12-26 (×6): 1 [IU] via SUBCUTANEOUS
  Administered 2019-12-27: 20:00:00 2 [IU] via SUBCUTANEOUS
  Administered 2019-12-27 (×3): 1 [IU] via SUBCUTANEOUS
  Administered 2019-12-28: 13:00:00 2 [IU] via SUBCUTANEOUS

## 2019-12-24 NOTE — Progress Notes (Addendum)
CRITICAL VALUE ALERT  Critical Value:  Calcium 6.4, Lactic acid 2.1  Date & Time Notied:  12/24/19; 20:28  Provider Notified: E-link  Orders Received/Actions taken: Will wait for order, possible lab draw after 4 hours because calcium gluconate iv just finished at 19:53

## 2019-12-24 NOTE — ED Provider Notes (Addendum)
[facility] DEPT Provider Note   CSN: [id number] Arrival date & time: 12/24/19  1157     History Chief Complaint  Patient presents with  . Abdominal Pain    Yolanda Flores is a 34 y.o. female.  HPI   \ 34 yo female complaining of upper abdominal pain began gradually WEdnesday. Initially felt constipated and took miralax.  Pain is severe and sharp and unlike pain from pancreatitis.  Pain is 10/10.  Took tylenol and helped headache but not abdominal pain.  Vomiting frequently since yesterday.  No blood or bile.  No fever,or diarrhea.  Some chills.No sick contacts.  Covid x 2.  No known sick contacts. Flu shot one week ago. Patient drinking alcohol until Tuesday.   Past Medical History:  Diagnosis Date  . Hypertension     Patient Active Problem List   Diagnosis Date Noted  . Normocytic anemia 01/17/2019  . Thrombocytosis 01/17/2019  . Heartburn   . Odynophagia   . Acute renal failure ([place]) 01/08/2019  . Shock circulatory ([place]) 01/08/2019  . Acute gallstone pancreatitis 01/04/2019  . Pancreatitis 01/04/2019  . Chest pain 05/24/2018  . HTN (hypertension) 05/24/2018  . Hypokalemia 05/24/2018  . Lactic acidosis 05/24/2018  . Tobacco abuse 05/24/2018  . Alcohol use disorder, mild, in early remission 05/24/2018  . Abnormal LFTs 05/24/2018  . Prolonged QT interval     Past Surgical History:  Procedure Laterality Date  . FOOT SURGERY       OB History   No obstetric history on file.     Family History  Problem Relation Age of Onset  . Hypertension Mother   . Hypertension Father   . Hypertension Brother     Social History   Tobacco Use  . Smoking status: Former Smoker    Packs/day: 4.00    Types: Cigarettes    Quit date: 05/24/2018    Years since quitting: 1.5  . Smokeless tobacco: Never Used  Substance Use Topics  . Alcohol use: Yes    Comment: occasionally  . Drug use: No    Home Medications Prior to Admission  medications   Medication Sig Start Date End Date Taking? Authorizing Provider  amLODipine (NORVASC) 10 MG tablet Take 1 tablet (10 mg total) by mouth daily. 02/07/19   Ladell Pier, MD  ferrous sulfate 325 (65 FE) MG tablet Take 1 tablet (325 mg total) by mouth 2 (two) times daily with a meal. 01/18/19   Hosie Poisson, MD  folic acid (FOLVITE) 1 MG tablet Take 1 tablet (1 mg total) by mouth daily. 01/18/19   Hosie Poisson, MD  hydrALAZINE (APRESOLINE) 10 MG tablet Take 2 tablets (20 mg total) by mouth every 8 (eight) hours. 02/07/19   Ladell Pier, MD  Multiple Vitamin (MULTIVITAMIN WITH MINERALS) TABS tablet Take 1 tablet by mouth daily. 01/18/19   Hosie Poisson, MD  pantoprazole (PROTONIX) 40 MG tablet TAKE 1 TABLET(40 MG) BY MOUTH DAILY 03/22/19   Esterwood, Amy S, PA-C  polyethylene glycol (MIRALAX / GLYCOLAX) 17 g packet Take 17 g by mouth daily as needed for mild constipation. 01/17/19   Hosie Poisson, MD  sucralfate (CARAFATE) 1 GM/10ML suspension Take 10 mLs (1 g total) by mouth 4 (four) times daily -  with meals and at bedtime. 01/17/19   Hosie Poisson, MD  thiamine 100 MG tablet Take 1 tablet (100 mg total) by mouth daily. 01/18/19   Hosie Poisson, MD    Allergies  Ibuprofen  Review of Systems   Review of Systems  Constitutional: Positive for chills.  HENT: Negative.   Eyes: Negative.   Respiratory: Positive for shortness of breath.   Gastrointestinal: Positive for abdominal pain, nausea and vomiting.  Endocrine: Negative.   Genitourinary: Negative.   Musculoskeletal: Negative.   Skin: Negative.   Allergic/Immunologic: Negative.   Neurological: Negative.   Hematological: Bruises/bleeds easily.  Psychiatric/Behavioral: Negative.   All other systems reviewed and are negative.   Physical Exam Updated Vital Signs BP (!) 86/57 (BP Location: Right Arm)   Pulse (!) 143 Comment: nuse aware of pt.  Temp 97.7 F (36.5 C) (Oral)   Resp (!) 22   Ht 1.702 m (5\' 7" )   Wt 72.6 kg    SpO2 100%   BMI 25.06 kg/m   Physical Exam Vitals and nursing note reviewed.  Constitutional:      Appearance: She is well-developed.  HENT:     Head: Normocephalic and atraumatic.  Eyes:     Extraocular Movements: Extraocular movements intact.  Cardiovascular:     Rate and Rhythm: Regular rhythm. Tachycardia present.     Heart sounds: Normal heart sounds.  Abdominal:     General: Abdomen is flat. Bowel sounds are decreased.     Palpations: Abdomen is soft.     Tenderness: There is abdominal tenderness in the right upper quadrant and epigastric area.  Skin:    General: Skin is warm and dry.     Capillary Refill: Capillary refill takes less than 2 seconds.     Coloration: Pallor: .now.  Neurological:     General: No focal deficit present.     Mental Status: She is alert and oriented to person, place, and time.     Cranial Nerves: No cranial nerve deficit.     Motor: No weakness.  Psychiatric:        Mood and Affect: Mood normal.     ED Results / Procedures / Treatments   Labs (all labs ordered are listed, but only abnormal results are displayed) Labs Reviewed  LIPASE, BLOOD  COMPREHENSIVE METABOLIC PANEL  CBC  URINALYSIS, ROUTINE W REFLEX MICROSCOPIC  I-STAT BETA HCG BLOOD, ED ([facility], [facility], [facility] ONLY)    EKG EKG Interpretation  Date/Time:  Saturday December 24 2019 14:07:34 EST Ventricular Rate:  151 PR Interval:    QRS Duration: 82 QT Interval:  274 QTC Calculation: 435 R Axis:   -14 Text Interpretation: Sinus tachycardia Multiple ventricular premature complexes Aberrant complex Borderline T wave abnormalities Confirmed by Pattricia Boss [phone]) on 12/24/2019 2:43:14 PM   Radiology CT ABDOMEN PELVIS WO CONTRAST  Result Date: 12/24/2019 CLINICAL DATA:  34 year old female with history of epigastric pain for the past 2 days. EXAM: CT ABDOMEN AND PELVIS WITHOUT CONTRAST TECHNIQUE: Multidetector CT imaging of the abdomen and pelvis was performed following the  standard protocol without IV contrast. COMPARISON:  CT the abdomen and pelvis 01/13/2019. FINDINGS: Lower chest: Scattered areas of mild scarring are noted in the lower lobes of the lungs bilaterally. Hepatobiliary: Diffuse low attenuation throughout the hepatic parenchyma, indicative of hepatic steatosis. Low-attenuation lesions anteriorly in the liver measuring up to 1.2 cm in diameter in segment 4B adjacent to the falciform ligament, incompletely characterized on today's non-contrast CT examination, but statistically likely to represent cysts. Intermediate attenuation amorphous material lying dependently in the gallbladder, compatible with biliary sludge. Gallbladder is only mildly distended, without surrounding inflammatory changes to clearly indicate an acute cholecystitis. Pancreas: Inflammatory changes noted adjacent  to the pancreas, most evident adjacent to the head and proximal body, concerning for acute pancreatitis. Spleen: Unremarkable. Adrenals/Urinary Tract: Unenhanced appearance of the kidneys and bilateral adrenal glands is normal. No hydroureteronephrosis. Urinary bladder is unremarkable in appearance. Stomach/Bowel: Unenhanced appearance of the stomach is normal. There is no pathologic dilatation of small bowel or colon. Vascular/Lymphatic: No atherosclerotic calcifications in the abdominal aorta or pelvic vasculature. No definite lymphadenopathy noted in the abdomen or pelvis on today's noncontrast CT examination. Reproductive: Unenhanced appearance of the uterus and ovaries is unremarkable. Other: No significant volume of ascites.  No pneumoperitoneum. Musculoskeletal: There are no aggressive appearing lytic or blastic lesions noted in the visualized portions of the skeleton. IMPRESSION: 1. Inflammatory changes surrounding the pancreas concerning for acute pancreatitis. Correlation with lipase levels is strongly recommended. 2. Severe hepatic steatosis. 3. Biliary sludge lying dependently in the  gallbladder. No definite imaging findings to suggest an acute cholecystitis at this time. Electronically Signed   By: Vinnie Langton M.D.   On: 12/24/2019 15:00   DG Chest Port 1 View  Result Date: 12/24/2019 CLINICAL DATA:  34 year old female with history of upper abdominal pain and dyspnea for the past 2 days. EXAM: PORTABLE CHEST 1 VIEW COMPARISON:  Chest x-[facility] 01/17/2019. FINDINGS: Lung volumes are normal. No consolidative airspace disease. No pleural effusions. No pneumothorax. No pulmonary nodule or mass noted. Pulmonary vasculature and the cardiomediastinal silhouette are within normal limits. IMPRESSION: No radiographic evidence of acute cardiopulmonary disease. Electronically Signed   By: Vinnie Langton M.D.   On: 12/24/2019 14:54    Procedures .Critical Care Performed by: Pattricia Boss, MD Authorized by: Pattricia Boss, MD   Critical care provider statement:    Critical care time (minutes):  60   Critical care end time:  12/24/2019 3:31 PM   Critical care was necessary to treat or prevent imminent or life-threatening deterioration of the following conditions:  Renal failure   Critical care was time spent personally by me on the following activities:  Discussions with consultants, evaluation of patient's response to treatment, examination of patient, ordering and performing treatments and interventions, ordering and review of laboratory studies, ordering and review of radiographic studies, pulse oximetry, re-evaluation of patient's condition, obtaining history from patient or surrogate and review of old charts   (including critical care time)  Medications Ordered in ED Medications - No data to display  ED Course  I have reviewed the triage vital signs and the nursing notes.  Pertinent labs & imaging results that were available during my care of the patient were reviewed by me and considered in my medical decision making (see chart for details).  Clinical Course as of 12/24/19  1502  Sat Dec 24, 2019  1445 Reevaluating patient at bedside- hr 135 at one liter ns. Patient had ms with mild relied Labs with creatinine increased to 2.55 [DR]  1445 Creatinine(!): 2.55 [DR]  1446 Increased creatinine with last normal 11 months ago New increased transaminases and bili, lipase 1184   [DR]    Clinical Course User Index [DR] Pattricia Boss, MD   MDM Rules/Calculators/A&P                          34 year old female history of alcohol abuse, pancreatitis, and gallstone pancreatitis presents today with epigastric pain. Here she has elevated creatinine 2.55 consistent with volume depletion and acute kidney injury. She has elevated transaminases, significantly elevated lipase and bilirubin. CT is being obtained. Patient had  resumed drinking alcohol and quit on Tuesday. Patient has previously been followed by [facility]. They are currently paged for consult. Patient is receiving IV fluids, pain medicines, and Ativan Discussed with Dr. Fuller Plan who will see for GI 1- acute abdominal pain- hepatitis pancreatitis- gstone pancreatitis vs acute cholecystitis and pancreatitis 2- alcohol abuse/withdrawal 3- aki  Consultants Dr.Stark- GI Dr. Neysa Bonito- Hospitalist- will admit 4:32 PM discussed with Dr. Neysa Bonito.  He states when he went to see patient she was very confused.  I went in and reevaluated patient.  She is mumbling but is able to tell me her name.  I suspect this is secondary to medications.  She received morphine a total of 4 mg and 2 mg increments.  She also received 1 mg of Ativan due to concerns for withdrawal.  However, she does continue very tachycardic.  I do not see any focal deficits.  Dr. Neysa Bonito has consulted with intensive care and they are seeing and evaluating also.  **Final Clinical Impression(s) / ED Diagnoses Final diagnoses:  Abdominal pain, unspecified abdominal location  Acute pancreatitis, unspecified complication status, unspecified pancreatitis type  Hepatitis   Alcohol withdrawal syndrome without complication [facility])    Rx / DC Orders ED Discharge Orders    None       Pattricia Boss, MD 12/24/19 1534    Pattricia Boss, MD 12/24/19 1635

## 2019-12-24 NOTE — Progress Notes (Signed)
CRITICAL VALUE ALERT  Critical Value:  Lactic Acid 2.3  Date & Time Notied:  12/24/19; 23:09  Provider Notified: Elink  Orders Received/Actions taken: Awaiting for orders, continue monitoring patient

## 2019-12-24 NOTE — Progress Notes (Addendum)
eLink Physician-Brief Progress Note Patient Name: KAWEHI HOSTETTER DOB: 05/13/1985 MRN: [id number]   Date of Service  12/24/2019  HPI/Events of Note  BMP and Lactic acid labs reviewed.  eICU Interventions  Normal Saline 1000 ml iv bolus x 1, stat beta hydroxybutyric acid level to r/o DKA, interval lactate level also ordered to ensure complete resolution of lactic acidosis.        Kerry Kass Dennys Traughber 12/24/2019, 11:24 PM

## 2019-12-24 NOTE — Progress Notes (Signed)
eLink Physician-Brief Progress Note Patient Name: Yolanda Flores DOB: Jul 22, 1985 MRN: [id number]   Date of Service  12/24/2019  HPI/Events of Note  Patient needs interval serum calcium assessment order following calcium gluconate repletion for serum calcium of 6.4 gm / dl.  eICU Interventions  Ionized calcium check ordered for 23:00 hours.        Yolanda Flores 12/24/2019, 8:46 PM

## 2019-12-24 NOTE — ED Triage Notes (Addendum)
BIB EMS due to abd pain x 2 days, no BM x 2 days. RT upper abd pain. Vomiting at intervals. No nausea at present. #20 L AC 120 HR. 500 ml NS given.  143/87-129-100%-CBG 114. Pt hyperventilating in triage

## 2019-12-24 NOTE — H&P (Signed)
NAME:  Yolanda Flores, MRN:  [id number], DOB:  Aug 20, 1985, LOS: 0 ADMISSION DATE:  12/24/2019, CONSULTATION DATE:  12/24/19 REFERRING MD:  Neysa Bonito, CHIEF COMPLAINT:  pancreatitis   Brief History   Acute pancreatitis, AKI, AGMA, acute liver injury  History of present illness   Yolanda Flores is a 34 y/o woman with a history of HTN and gallstone vs ETOH-induced pancreatitis in December 2020 who presents with 3 days of abdominal pain.  History is obtained from her boyfriend at bedside due to patient's confusion.  She originally attributed the pain to constipation, which was not relieved with MiraLAX.  The pain became sharp and more severe.  She has been vomiting continuously since yesterday.  She is up-to-date on Covid vaccines and had her flu shot a week ago.  She drinks about 4 days out of the week and takes a few shots each day, but has not recently had any days of significant drinking above average.  She does not drink on a daily basis.  Last alcohol intake 4 days ago.  Records from previous hospitalization reviewed-in December 2020 she had circulatory shock, ATN complicating her admission.   Per her mother [name] (in the chart listed as [place]) she has been drinking significantly for the past 1 year.   Boyfriend at bedside Maylene Roes [phone]  Past Medical History  ETOH abuse- drinks about half the days of the week HTN Pancreatitis- likely due to ETOH abuse   Significant Hospital Events     Consults:  GI  Procedures:    Significant Diagnostic Tests:  CT abd: pancreatic inflammation, BG sludge but no wall edema B-hCG negative  Micro Data:  Blood cx 12/11> Urine cx 12/11>  Antimicrobials:    Interim history/subjective:    Objective   Blood pressure (!) 118/97, pulse (!) 139, temperature 98.7 F (37.1 C), temperature source Oral, resp. rate (!) 30, height 5\' 6"  (1.676 m), weight 73.6 kg, SpO2 100 %.       No intake or output data in the 24 hours ending 12/24/19 1724 Filed  Weights   12/24/19 1208 12/24/19 1714  Weight: 72.6 kg 73.6 kg    Examination: General: ill appearing woman laying in bed in NAD HENT: [place]/AT, eyes anicteric, oral mucosa moist. Some bleeding from cut on bottom lip. Hyperplastic gums. Lungs: Tachypnea without accessory muscle use, CTAB, on RA Cardiovascular: tachycardic, regular rhythm Abdomen: TTP with guarding. No bruising over abdomen or flanks. Hypoactive BS. Extremities: no peripheral edema Neuro: fatigued but able to wake up to answer short questions, tracks with her eyes, moving her arms to stop abdominal exam but otherwise laying still. Derm: warm, dry  Resolved Hospital Problem list     Assessment & Plan:  Acute pancreatitis with MOF- likely due to ETOH. No gallstones visible on CT, but biliary sludge is present with significantly elevated LFTs. -GI consult- [place] -admit to ICU -NPO -RUQ [place] to r/o gallstones -Resuscitation with LR > normal saline; using bicarb to address severe metabolic acidosis -zofran PRN for nausea and vomiting -No evidence of necrosis on imaging and low concern for infection in acute phase, so no indication for antibiotics at this phase -Family understands she is high risk for requiring RRT and MV this admission- discussed with her boyfriend and mother.  AKI, suspect pre-renal vs ATN Acute metabolic acidosis Hyponatremia & hypochloremia- hypovolemic -checking lactate -aggressive volume resuscitation -serial BMPs; repeat STAT -con't to monitor  Hypocalcemia; poor prognostic sign -supplementation  Acute liver injury- elevated LFTs, hyperbilirubinemia -serial  CMPs -checking INR, ammonia -RUQ [place] -avoid tylenol  Acute encephalopathy- potentially due to pain meds given in the ED vs AKI or acute liver injury -dilaudid for pain given both kidney and liver injury -may require intubation for airway protection if progressive confusion -checking ammonia level  ETOH abuse, potential for alcohol  withdrawal -CIWA -supplemental vitamins  Thrombocytopenia -checking HIV -con't to monitor  Hyperglycemia -SSI w/ Q4h accuchecks  BF Desmon updated at bedside and mother [name] updated via phone. Mother is patient's surrogate Therapist, nutritional (evaluated daily)   Diet: NPO Pain/Anxiety/Delirium protocol (if indicated): fentanyl VAP protocol (if indicated): n/a DVT prophylaxis: heparin GI prophylaxis: pantoprazole Glucose control: SSI Mobility: bedrest last date of multidisciplinary goals of care discussion Family and staff present  Summary of discussion  Follow up goals of care discussion due Code Status: full Disposition: ICU  Labs   CBC: Recent Labs  Lab 12/24/19 1214  WBC 6.4  HGB 12.5  HCT 38.4  MCV 105.8*  PLT 117*    Basic Metabolic Panel: Recent Labs  Lab 12/24/19 1214  NA 132*  K 5.1  CL 93*  CO2 7*  GLUCOSE 140*  BUN 18  CREATININE 2.55*  CALCIUM 8.3*   GFR: Estimated Creatinine Clearance: 32.8 mL/min (A) (by C-G formula based on SCr of 2.55 mg/dL (H)). Recent Labs  Lab 12/24/19 1214  WBC 6.4    Liver Function Tests: Recent Labs  Lab 12/24/19 1214  AST 1,634*  ALT 221*  ALKPHOS 112  BILITOT 3.9*  PROT 9.1*  ALBUMIN 4.9   Recent Labs  Lab 12/24/19 1214 12/24/19 1333  LIPASE 1,184* 1,074*   No results for input(s): AMMONIA in the last 168 hours.  ABG    Component Value Date/Time   TCO2 27 11/25/2007 0635     Coagulation Profile: No results for input(s): INR, PROTIME in the last 168 hours.  Cardiac Enzymes: No results for input(s): CKTOTAL, CKMB, CKMBINDEX, TROPONINI in the last 168 hours.  HbA1C: Hgb A1c MFr Bld  Date/Time Value Ref Range Status  01/05/2019 04:14 AM 4.9 4.8 - 5.6 % Final    Comment:    (NOTE) Pre diabetes:          5.7%-6.4% Diabetes:              >6.4% Glycemic control for   <7.0% adults with diabetes   05/25/2018 04:03 AM 5.0 4.8 - 5.6 % Final    Comment:    (NOTE) Pre  diabetes:          5.7%-6.4% Diabetes:              >6.4% Glycemic control for   <7.0% adults with diabetes     CBG: No results for input(s): GLUCAP in the last 168 hours.  Review of Systems:   ROS limited due to mental status  Past Medical History  She,  has a past medical history of AKI (acute kidney injury) ([place]), Hypertension, and Pancreatitis.   Surgical History    Past Surgical History:  Procedure Laterality Date  . FOOT SURGERY       Social History   reports that she quit smoking about 19 months ago. Her smoking use included cigarettes. She smoked 4.00 packs per day. She has never used smokeless tobacco. She reports current alcohol use. She reports that she does not use drugs.   Family History   Her family history includes Diabetes in her mother; Hypertension in her brother, father, and mother.  Allergies Allergies  Allergen Reactions  . Ibuprofen      Home Medications  Prior to Admission medications   Medication Sig Start Date End Date Taking? Authorizing Provider  amLODipine (NORVASC) 10 MG tablet Take 1 tablet (10 mg total) by mouth daily. 02/07/19  Yes Ladell Pier, MD  ferrous sulfate 325 (65 FE) MG tablet Take 1 tablet (325 mg total) by mouth 2 (two) times daily with a meal. 01/18/19  Yes Hosie Poisson, MD  folic acid (FOLVITE) 1 MG tablet Take 1 tablet (1 mg total) by mouth daily. 01/18/19  Yes Hosie Poisson, MD  hydrALAZINE (APRESOLINE) 10 MG tablet Take 2 tablets (20 mg total) by mouth every 8 (eight) hours. 02/07/19  Yes Ladell Pier, MD  lisinopril (ZESTRIL) 20 MG tablet Take 20 mg by mouth daily.   Yes [provider]  Multiple Vitamin (MULTIVITAMIN WITH MINERALS) TABS tablet Take 1 tablet by mouth daily. 01/18/19  Yes Hosie Poisson, MD  pantoprazole (PROTONIX) 40 MG tablet TAKE 1 TABLET(40 MG) BY MOUTH DAILY Patient taking differently: Take 40 mg by mouth daily. 03/22/19  Yes Esterwood, Amy S, PA-C  polyethylene glycol (MIRALAX / GLYCOLAX)  17 g packet Take 17 g by mouth daily as needed for mild constipation. 01/17/19  Yes Hosie Poisson, MD  sucralfate (CARAFATE) 1 GM/10ML suspension Take 10 mLs (1 g total) by mouth 4 (four) times daily -  with meals and at bedtime. Patient not taking: No sig reported 01/17/19   Hosie Poisson, MD  thiamine 100 MG tablet Take 1 tablet (100 mg total) by mouth daily. Patient not taking: No sig reported 01/18/19   Hosie Poisson, MD     Critical care time: 55 minutes     Julian Hy, DO 12/24/19 5:28 PM [facility]

## 2019-12-24 NOTE — Progress Notes (Signed)
[facility] Progress Note Patient Name: Yolanda Flores DOB: 1985/10/01 MRN: [id number]   Date of Service  12/24/2019  HPI/Events of Note  Lactic acid 2.1  eICU Interventions  Continue current Rx.        Yolanda Flores 12/24/2019, 9:45 PM

## 2019-12-24 NOTE — Progress Notes (Signed)
Spoke with MD Carlis Abbott about pt's current hypertension of 140/114. MD states that she doesn't want to order anything for it at this and to notify MD on call if diastolic is greater than 505.

## 2019-12-25 ENCOUNTER — Inpatient Hospital Stay (HOSPITAL_COMMUNITY): Payer: BLUE CROSS/BLUE SHIELD

## 2019-12-25 ENCOUNTER — Other Ambulatory Visit: Payer: Self-pay

## 2019-12-25 DIAGNOSIS — R7989 Other specified abnormal findings of blood chemistry: Secondary | ICD-10-CM

## 2019-12-25 DIAGNOSIS — E876 Hypokalemia: Secondary | ICD-10-CM

## 2019-12-25 LAB — COMPREHENSIVE METABOLIC PANEL
ALT: 135 U/L — ABNORMAL HIGH (ref 0–44)
AST: 855 U/L — ABNORMAL HIGH (ref 15–41)
Albumin: 3.4 g/dL — ABNORMAL LOW (ref 3.5–5.0)
Alkaline Phosphatase: 61 U/L (ref 38–126)
Anion gap: 18 — ABNORMAL HIGH (ref 5–15)
BUN: 15 mg/dL (ref 6–20)
CO2: 21 mmol/L — ABNORMAL LOW (ref 22–32)
Calcium: 7.5 mg/dL — ABNORMAL LOW (ref 8.9–10.3)
Chloride: 95 mmol/L — ABNORMAL LOW (ref 98–111)
Creatinine, Ser: 1.18 mg/dL — ABNORMAL HIGH (ref 0.44–1.00)
GFR, Estimated: >60 mL/min (ref >60)
Glucose, Bld: 134 mg/dL — ABNORMAL HIGH (ref 70–99)
Potassium: 3.2 mmol/L — ABNORMAL LOW (ref 3.5–5.1)
Sodium: 134 mmol/L — ABNORMAL LOW (ref 135–145)
Total Bilirubin: 2.8 mg/dL — ABNORMAL HIGH (ref 0.3–1.2)
Total Protein: 6.8 g/dL (ref 6.5–8.1)

## 2019-12-25 LAB — BETA-HYDROXYBUTYRIC ACID
Beta-Hydroxybutyric Acid: >8 mmol/L — ABNORMAL HIGH (ref 0.05–0.27)
Beta-Hydroxybutyric Acid: 6.18 mmol/L — ABNORMAL HIGH (ref 0.05–0.27)

## 2019-12-25 LAB — GLUCOSE, CAPILLARY
Glucose-Capillary: 136 mg/dL — ABNORMAL HIGH (ref 70–99)
Glucose-Capillary: 166 mg/dL — ABNORMAL HIGH (ref 70–99)
Glucose-Capillary: 122 mg/dL — ABNORMAL HIGH (ref 70–99)
Glucose-Capillary: 103 mg/dL — ABNORMAL HIGH (ref 70–99)
Glucose-Capillary: 178 mg/dL — ABNORMAL HIGH (ref 70–99)
Glucose-Capillary: 115 mg/dL — ABNORMAL HIGH (ref 70–99)
Glucose-Capillary: 131 mg/dL — ABNORMAL HIGH (ref 70–99)

## 2019-12-25 LAB — BASIC METABOLIC PANEL
Anion gap: 22 — ABNORMAL HIGH (ref 5–15)
Anion gap: 19 — ABNORMAL HIGH (ref 5–15)
BUN: 13 mg/dL (ref 6–20)
BUN: 12 mg/dL (ref 6–20)
CO2: 21 mmol/L — ABNORMAL LOW (ref 22–32)
CO2: 25 mmol/L (ref 22–32)
Calcium: 8 mg/dL — ABNORMAL LOW (ref 8.9–10.3)
Calcium: 7.7 mg/dL — ABNORMAL LOW (ref 8.9–10.3)
Chloride: 90 mmol/L — ABNORMAL LOW (ref 98–111)
Chloride: 90 mmol/L — ABNORMAL LOW (ref 98–111)
Creatinine, Ser: 0.96 mg/dL (ref 0.44–1.00)
Creatinine, Ser: 0.91 mg/dL (ref 0.44–1.00)
GFR, Estimated: >60 mL/min (ref >60)
GFR, Estimated: >60 mL/min (ref >60)
Glucose, Bld: 176 mg/dL — ABNORMAL HIGH (ref 70–99)
Glucose, Bld: 139 mg/dL — ABNORMAL HIGH (ref 70–99)
Potassium: 3.3 mmol/L — ABNORMAL LOW (ref 3.5–5.1)
Potassium: 3.4 mmol/L — ABNORMAL LOW (ref 3.5–5.1)
Sodium: 133 mmol/L — ABNORMAL LOW (ref 135–145)
Sodium: 134 mmol/L — ABNORMAL LOW (ref 135–145)

## 2019-12-25 LAB — MAGNESIUM
Magnesium: <1 mg/dL — CL (ref 1.7–2.4)
Magnesium: 2.7 mg/dL — ABNORMAL HIGH (ref 1.7–2.4)
Magnesium: 2.5 mg/dL — ABNORMAL HIGH (ref 1.7–2.4)

## 2019-12-25 LAB — CBC
HCT: 28.5 % — ABNORMAL LOW (ref 36.0–46.0)
Hemoglobin: 10.1 g/dL — ABNORMAL LOW (ref 12.0–15.0)
MCH: 35.1 pg — ABNORMAL HIGH (ref 26.0–34.0)
MCHC: 35.4 g/dL (ref 30.0–36.0)
MCV: 99 fL (ref 80.0–100.0)
Platelets: 88 10*3/uL — ABNORMAL LOW (ref 150–400)
RBC: 2.88 MIL/uL — ABNORMAL LOW (ref 3.87–5.11)
RDW: 13.1 % (ref 11.5–15.5)
WBC: 8.7 10*3/uL (ref 4.0–10.5)
nRBC: 0 % (ref 0.0–0.2)

## 2019-12-25 LAB — LACTIC ACID, PLASMA: Lactic Acid, Venous: 2.2 mmol/L (ref 0.5–1.9)

## 2019-12-25 LAB — PHOSPHORUS
Phosphorus: <1 mg/dL — CL (ref 2.5–4.6)
Phosphorus: 1.4 mg/dL — ABNORMAL LOW (ref 2.5–4.6)

## 2019-12-25 LAB — HIV ANTIBODY (ROUTINE TESTING W REFLEX): HIV Screen 4th Generation wRfx: NONREACTIVE

## 2019-12-25 LAB — LIPASE, BLOOD: Lipase: 104 U/L — ABNORMAL HIGH (ref 11–51)

## 2019-12-25 LAB — PROTIME-INR
INR: 1.2 (ref 0.8–1.2)
Prothrombin Time: 14.3 seconds (ref 11.4–15.2)

## 2019-12-25 MED ORDER — MAGNESIUM SULFATE 4 GM/100ML IV SOLN
4.0000 g | Freq: Once | INTRAVENOUS | Status: AC
  Administered 2019-12-25: 05:00:00 4 g via INTRAVENOUS
  Filled 2019-12-25: qty 100

## 2019-12-25 MED ORDER — POTASSIUM PHOSPHATES 15 MMOLE/5ML IV SOLN
45.0000 mmol | Freq: Once | INTRAVENOUS | Status: AC
  Administered 2019-12-25: 08:00:00 45 mmol via INTRAVENOUS
  Filled 2019-12-25: qty 15

## 2019-12-25 MED ORDER — POTASSIUM CHLORIDE 10 MEQ/100ML IV SOLN
10.0000 meq | INTRAVENOUS | Status: AC
  Administered 2019-12-25 (×4): 10 meq via INTRAVENOUS
  Filled 2019-12-25 (×4): qty 100

## 2019-12-25 MED ORDER — CALCIUM GLUCONATE-NACL 2-0.675 GM/100ML-% IV SOLN
2.0000 g | Freq: Once | INTRAVENOUS | Status: AC
  Administered 2019-12-25: 05:00:00 2000 mg via INTRAVENOUS
  Filled 2019-12-25: qty 100

## 2019-12-25 MED ORDER — MAGNESIUM SULFATE 2 GM/50ML IV SOLN
2.0000 g | Freq: Once | INTRAVENOUS | Status: AC
  Administered 2019-12-25: 09:00:00 2 g via INTRAVENOUS
  Filled 2019-12-25: qty 50

## 2019-12-25 MED ORDER — PNEUMOCOCCAL VAC POLYVALENT 25 MCG/0.5ML IJ INJ
0.5000 mL | INJECTION | INTRAMUSCULAR | Status: DC
  Filled 2019-12-25: qty 0.5

## 2019-12-25 NOTE — Progress Notes (Signed)
CRITICAL VALUE ALERT  Critical Value:  Lactic acid 2.2,  Magnesium  <1.0  Date & Time Notied:  12/25/19; 04:18  Provider Notified: E-Link  Orders Received/Actions taken: Awaiting for orders

## 2019-12-25 NOTE — Consult Note (Addendum)
Consultation  Referring Provider: CCM / Dr. Carlis Abbott  Primary Care Physician:  Ladell Pier, MD Primary Gastroenterologist:  Dr. Arelia Sneddon  Reason for Consultation:   Acute severe pancreatitis/elevated LFT's  HPI: Yolanda Flores is a 34 y.o. female ,, who was admitted yesterday after presenting to the emergency room with 2-3-day history of progressively severe abdominal pain which was sharp and constant.  Per notes patient initially felt this was different than prior episodes of pancreatitis.  She had associated nausea and vomiting which was intractable on the day prior to admission. Patient does have history of significant EtOH use, her admit notes mother reports that she had been drinking heavily over the past year, patient reported that she was drinking 3 to 4 days/week and few shots per day.  On further conversation with patient this morning, she says she does not drink every day but on the days that she does drink she will have at least 4-5 shots. Patient does have prior history of acute severe EtOH induced pancreatitis in December 2020 which was associated with SIRS and acute kidney injury.  She was seen in our office once post hospital for follow-up after that event.  At that time she had stopped consuming alcohol. Work-up on admit with CT of the abdomen pelvis shows severe hepatic steatosis, small hepatic cysts, there is gallbladder sludge but no definite stones, and no evidence of cholecystitis.  Acute pancreatic inflammatory changes consistent with acute pancreatitis. Initial lipase 1184, she was acidotic with a lactate of 2.4 and with acute kidney injury. She has been cared for by CCM with aggressive volume replacement and electrolyte replacement. Labs today improved, lipase down to 104 from 1184, potassium 3.2/CO2 21/creatinine 1.18. Calcium improved 7.5 Magnesium less than 1/phosphorus less than 1 WBC 8.7, hemoglobin 10.1/platelets 88 INR 1.2, lactate 2.2 T bili 2.8/alk phos  61/AST 855/ALT 135  Patient also had been noted to be confused last evening, felt to have acute encephalopathy, ammonia yesterday of 107.  Patient says she is feeling much better today, nursing concurs that mentation is much improved.  She says her abdominal pain is significantly improved as well and she is really just sore across her upper abdomen.  She has not had any more vomiting, still has some mild nausea.  She says she felt terrible prior to coming in the hospital "short of breath that day in addition to having her GI symptoms and says she has never felt that way before.   Past Medical History:  Diagnosis Date  . AKI (acute kidney injury) ([place])   . Hypertension   . Pancreatitis     Past Surgical History:  Procedure Laterality Date  . FOOT SURGERY      Prior to Admission medications   Medication Sig Start Date End Date Taking? Authorizing Provider  amLODipine (NORVASC) 10 MG tablet Take 1 tablet (10 mg total) by mouth daily. 02/07/19  Yes Ladell Pier, MD  ferrous sulfate 325 (65 FE) MG tablet Take 1 tablet (325 mg total) by mouth 2 (two) times daily with a meal. 01/18/19  Yes Hosie Poisson, MD  folic acid (FOLVITE) 1 MG tablet Take 1 tablet (1 mg total) by mouth daily. 01/18/19  Yes Hosie Poisson, MD  hydrALAZINE (APRESOLINE) 10 MG tablet Take 2 tablets (20 mg total) by mouth every 8 (eight) hours. 02/07/19  Yes Ladell Pier, MD  lisinopril (ZESTRIL) 20 MG tablet Take 20 mg by mouth daily.   Yes [provider]  Multiple Vitamin (MULTIVITAMIN WITH MINERALS) TABS tablet Take 1 tablet by mouth daily. 01/18/19  Yes Hosie Poisson, MD  pantoprazole (PROTONIX) 40 MG tablet TAKE 1 TABLET(40 MG) BY MOUTH DAILY Patient taking differently: Take 40 mg by mouth daily. 03/22/19  Yes Esterwood, Amy S, PA-C  polyethylene glycol (MIRALAX / GLYCOLAX) 17 g packet Take 17 g by mouth daily as needed for mild constipation. 01/17/19  Yes Hosie Poisson, MD  sucralfate (CARAFATE) 1 GM/10ML  suspension Take 10 mLs (1 g total) by mouth 4 (four) times daily -  with meals and at bedtime. Patient not taking: No sig reported 01/17/19   Hosie Poisson, MD  thiamine 100 MG tablet Take 1 tablet (100 mg total) by mouth daily. Patient not taking: No sig reported 01/18/19   Hosie Poisson, MD    Current Facility-Administered Medications  Medication Dose Route Frequency Provider Last Rate Last Admin  . chlorhexidine (PERIDEX) 0.12 % solution 15 mL  15 mL Mouth Rinse BID Noemi Chapel P, DO   15 mL at 12/24/19 2040  . Chlorhexidine Gluconate Cloth 2 % PADS 6 each  6 each Topical Daily Julian Hy, DO   6 each at 12/24/19 1727  . folic acid injection 1 mg  1 mg Intravenous Daily Julian Hy, DO   1 mg at 12/24/19 2039  . heparin injection 5,000 Units  5,000 Units Subcutaneous Q8H Julian Hy, DO   5,000 Units at 12/25/19 0515  . HYDROmorphone (DILAUDID) injection 0.5 mg  0.5 mg Intravenous Q2H PRN Noemi Chapel P, DO   0.5 mg at 12/25/19 5427  . insulin aspart (novoLOG) injection 1-3 Units  1-3 Units Subcutaneous Q4H Julian Hy, DO   1 Units at 12/25/19 0432  . lip balm (CARMEX) ointment   Topical PRN Noemi Chapel P, DO      . LORazepam (ATIVAN) tablet 1-4 mg  1-4 mg Oral Q1H PRN Julian Hy, DO       Or  . LORazepam (ATIVAN) injection 1-4 mg  1-4 mg Intravenous Q1H PRN Noemi Chapel P, DO      . magnesium sulfate IVPB 2 g 50 mL  2 g Intravenous Once Ogan, Okoronkwo U, MD      . MEDLINE mouth rinse  15 mL Mouth Rinse q12n4p Noemi Chapel P, DO   15 mL at 12/24/19 1749  . pantoprazole (PROTONIX) injection 40 mg  40 mg Intravenous QHS Noemi Chapel P, DO   40 mg at 12/24/19 2052  . potassium PHOSPHATE 45 mmol in dextrose 5 % 500 mL infusion  45 mmol Intravenous Once Frederik Pear, MD 86 mL/hr at 12/25/19 0739 45 mmol at 12/25/19 0739  . sodium bicarbonate 150 mEq in sterile water 1,000 mL infusion   Intravenous Continuous Julian Hy, DO 125 mL/hr at 12/25/19 0720 Infusion Verify at  12/25/19 0720  . thiamine (B-1) injection 100 mg  100 mg Intravenous Daily Julian Hy, DO   100 mg at 12/24/19 2039    Allergies as of 12/24/2019 - Review Complete 12/24/2019  Allergen Reaction Noted  . Ibuprofen  12/24/2019    Family History  Problem Relation Age of Onset  . Hypertension Mother   . Diabetes Mother   . Hypertension Father   . Hypertension Brother     Social History   Socioeconomic History  . Marital status: Single    Spouse name: Not on file  . Number of children: Not on file  . Years of  education: Not on file  . Highest education level: Not on file  Occupational History  . Not on file  Tobacco Use  . Smoking status: Former Smoker    Packs/day: 4.00    Types: Cigarettes    Quit date: 05/24/2018    Years since quitting: 1.5  . Smokeless tobacco: Never Used  Substance and Sexual Activity  . Alcohol use: Yes    Comment: occasionally  . Drug use: No  . Sexual activity: Yes  Other Topics Concern  . Not on file  Social History Narrative  . Not on file   Social Determinants of Health   Financial Resource Strain: Not on file  Food Insecurity: Not on file  Transportation Needs: Not on file  Physical Activity: Not on file  Stress: Not on file  Social Connections: Not on file  Intimate Partner Violence: Not on file    Review of Systems: Pertinent positive and negative review of systems were noted in the above HPI section.  All other review of systems was otherwise negative.Marland Kitchen  Physical Exam: Vital signs in last 24 hours: Temp:  [97.7 F (36.5 C)-99.1 F (37.3 C)] 98.8 F (37.1 C) (12/12 0400) Pulse Rate:  [112-153] 112 (12/12 0400) Resp:  [19-41] 28 (12/12 0400) BP: (86-146)/(57-125) 136/106 (12/12 0400) SpO2:  [92 %-100 %] 92 % (12/12 0400) Weight:  [72.6 kg-78 kg] 78 kg (12/12 0500) Last BM Date: 12/23/19 General:   Alert,  Well-developed, well-nourished young African-American female pleasant and cooperative in NAD Head:  Normocephalic  and atraumatic. Eyes:  Sclera clear, no icterus.   Conjunctiva pink. Ears:  Normal auditory acuity. Nose:  No deformity, discharge,  or lesions. Mouth:  No deformity or lesions.   Neck:  Supple; no masses or thyromegaly. Lungs:  Clear throughout to auscultation.   No wheezes, crackles, or rhonchi.  Heart: Tachycardic with heart rate in the 130s regular rate and rhythm; no murmurs, clicks, rubs,  or gallops. Abdomen:  Soft, mildly tender across the upper abdomen no guarding or rebound BS active,nonpalp mass or hsm.   Rectal: Not done Msk:  Symmetrical without gross deformities. . Pulses:  Normal pulses noted. Extremities:  Without clubbing or edema. Neurologic:  Alert and  oriented x4;  grossly normal neurologically. Skin:  Intact without significant lesions or rashes.. Psych:  Alert and cooperative. Normal mood and affect.  Intake/Output from previous day: 12/11 0701 - 12/12 0700 In: 7982.7 [I.V.:2164.3; IV Piggyback:5818.4] Out: 900 [Urine:900] Intake/Output this shift: Total I/O In: 787.8 [I.V.:593.8; IV Piggyback:194.1] Out: -   Lab Results: Recent Labs    12/24/19 1214 12/25/19 0235  WBC 6.4 8.7  HGB 12.5 10.1*  HCT 38.4 28.5*  PLT 117* 88*   BMET Recent Labs    12/24/19 1616 12/24/19 2204 12/25/19 0235  NA 136 133* 134*  K 4.4 4.1 3.2*  CL 104 97* 95*  CO2 9* 16* 21*  GLUCOSE 123* 275* 134*  BUN _0 CREATININE 2.01* 1.61* 1.18*  CALCIUM 6.4* 7.5* 7.5*   LFT Recent Labs    12/25/19 0235  PROT 6.8  ALBUMIN 3.4*  AST 855*  ALT 135*  ALKPHOS 61  BILITOT 2.8*   PT/INR Recent Labs    12/24/19 1730 12/25/19 0235  LABPROT 13.6 14.3  INR 1.1 1.2   Hepatitis Panel No results for input(s): HEPBSAG, HCVAB, HEPAIGM, HEPBIGM in the last 72 hours.   IMPRESSION:  #34 34 year old African-American female with history of severe EtOH induced pancreatitis December 2020,  readmitted yesterday with 2 to 3-day history of upper abdominal pain, then onset of  fairly intractable nausea and vomiting. She had some mental status changes on admit yesterday Work-up in the emergency room with labs and CT consistent with acute pancreatitis, complicated by acute kidney injury, metabolic acidosis/SIRS and acute liver injury.  Patient is significantly improved today abdominal pain much improved, no further vomiting and mental status back to baseline.  Etiology of pancreatitis is EtOH, with heavy EtOH use several days per week.  #2 elevated LFTs with AST 1600 and ALT to 21 on admit T bili 3.9. Suspect this may be a combination of shock liver and EtOH hepatitis, doubt biliary. Parameters much improved today  #3 hepatic steatosis secondary to EtOH #4 acute kidney injury improving #5 hypomagnesemia and hypophosphatemia being corrected #6 thrombocytopenia-etiology not clear   Plan: Start sips of clears Continue IV PPI daily Around-the-clock antiemetic Watch for EtOH withdrawal Continue to trend INR and LFTs, fortunately has had significant improvement within the first 24 hours Continue aggressive supportive management and volume replacement as per CCM.   Amy Esterwood PA-C 12/25/2019, 8:39 AM     Attending Physician Note   I have taken a history, examined the patient and reviewed the chart. I agree with the Advanced Practitioner's note, impression and recommendations.  Impression: * Acute pancreatitis with AKI, acidosis, hypocalcemia. No pancreatic necrosis on CT. * Alcohol abuse, ongoing * History of etoh pancreatitis in Dec. 2020 * Elevated LFTs, improved since admission, suspected shock liver and alcoholic hepatitis * Suspected GB sludge on CT however [place] did not show sludge. No biliary dilation. * Thrombocytopenia  Recommendation: * Vigorous volume replacement and supportive care * Trend CMP, CBC, Ca, Mg, Phos * CIWA protocol * Completely discontinue alcohol use   Lucio Edward, MD [facility]

## 2019-12-25 NOTE — Progress Notes (Signed)
[facility] Progress Note Patient Name: [name] DOB: July 22, 1985 MRN: [id number]   Date of Service  12/25/2019  HPI/Events of Note  Patient with multiple electrolyte abnormalities.  eICU Interventions  E-link adult electrolyte protocol ordered for K+, Mg+, Ca++ and Phosphorus.        Kerry Kass Christifer Chapdelaine 12/25/2019, 4:42 AM

## 2019-12-25 NOTE — Progress Notes (Signed)
NAME:  Yolanda Flores, MRN:  [id number], DOB:  1985-11-21, LOS: 1 ADMISSION DATE:  12/24/2019, CONSULTATION DATE:  12/24/19 REFERRING MD:  Neysa Bonito, CHIEF COMPLAINT:  pancreatitis   Brief History   Acute pancreatitis, AKI, AGMA, acute liver injury  History of present illness   [name] is a 34 y/o woman with a history of HTN and gallstone vs ETOH-induced pancreatitis in December 2020 who presents with 3 days of abdominal pain.  History is obtained from her boyfriend at bedside due to patient's confusion.  She originally attributed the pain to constipation, which was not relieved with MiraLAX.  The pain became sharp and more severe.  She has been vomiting continuously since yesterday.  She is up-to-date on Covid vaccines and had her flu shot a week ago.  She drinks about 4 days out of the week and takes a few shots each day, but has not recently had any days of significant drinking above average.  She does not drink on a daily basis.  Last alcohol intake 4 days ago.  Records from previous hospitalization reviewed-in December 2020 she had circulatory shock, ATN complicating her admission.   Per her mother [name] (in the chart listed as [place]) she has been drinking significantly for the past 1 year.   Boyfriend at bedside Maylene Roes [phone]  Past Medical History  ETOH abuse- drinks about half the days of the week HTN Pancreatitis- likely due to ETOH abuse   Significant Hospital Events     Consults:  GI  Procedures:    Significant Diagnostic Tests:  CT abd: pancreatic inflammation, BG sludge but no wall edema B-hCG negative  Micro Data:  Blood cx 12/11> Urine cx 12/11>  Antimicrobials:    Interim history/subjective:  Feeling better today; less abdominal pain, less SOB today. Passing gas, frequent UOP.  Objective   Blood pressure (!) 136/106, pulse (!) 112, temperature 98.8 F (37.1 C), temperature source Oral, resp. rate (!) 28, height 5\' 6"  (1.676 m), weight 78 kg, SpO2 92  %.        Intake/Output Summary (Last 24 hours) at 12/25/2019 0825 Last data filed at 12/25/2019 0720 Gross per 24 hour  Intake 8770.55 ml  Output 900 ml  Net 7870.55 ml   Filed Weights   12/24/19 1208 12/24/19 1714 12/25/19 0500  Weight: 72.6 kg 73.6 kg 78 kg    Examination: General: young woman laying in bed in NAD, appears much more alert today HENT: [place]/AT, eyes anicteric. Lungs: Breathing comfortably on room air, minimal tachypnea.  No accessory muscle use.  Clear to auscultation bilaterally. Cardiovascular: Tachycardic, regular rhythm Abdomen: Less tender to palpation, no guarding.  Bowel sounds present. Extremities: No peripheral edema, no clubbing or cyanosis Neuro: Awake and alert, answering questions appropriately, moving all extremities spontaneously Derm: Warm, dry, no rashes  Resolved Hospital Problem list   Acute encephalopathy  Assessment & Plan:  Acute pancreatitis with MOF- likely due to ETOH. No gallstones visible on CT, but biliary sludge is present with significantly elevated LFTs. -GI consult, [place]-appreciate their assistance -NPO; may be able to trial clear liquids in the coming days if she develops an appetite  -RUQ [place] to r/o gallstones -Resuscitation with LR; requiring bicarb for severe metabolic acidosis.  Bicarb infusion to 50 cc/h. -zofran PRN for nausea and vomiting -No evidence of necrosis on imaging and low concern for infection in acute phase, so no indication for antibiotics at this time  AKI, suspect pre-renal vs ATN Acute metabolic acidosis Hyponatremia &  hypochloremia- hypovolemic Lactic acidosis-resolving, due to hypovolemia.  Suspect ongoing type B lactic acidosis due to acute hepatic injury.  No evidence for acute infectious process. -serial lactate monitoring -Volume resuscitated, continue maintenance fluids until taking p.o. -con't to monitor  Severe electrolyte derangements, at risk for  arrhythmias Hypocalcemia Hypophosphatemia hypomagnesemia -supplementation & recheck -con't ICU monitoring  Acute liver injury- elevated LFTs, hyperbilirubinemia Hyperammonemia Normal coagulation profile -serial CMPs -RUQ [place] -avoid tylenol  Acute encephalopathy- potentially due to pain meds given in the ED vs AKI or acute liver injury. Resolved. -dilaudid for pain given both kidney and liver injury -holding on rectal lactulose with resolved encephalopathy  ETOH abuse, potential for alcohol withdrawal -CIWA -supplemental vitamins  Thrombocytopenia Acute vs chronic anemia with macrocytosis, likely concentrated at presentation. Presume chronic component of ETOH-related anemia. -checking HIV> negative -con't to monitor  Hyperglycemia- controlled -SSI w/ Q4h accuchecks -Glucose 140-180 if requiring insulin  Will updated mother when she comes to bedside today.  Best practice (evaluated daily)   Diet: NPO Pain/Anxiety/Delirium protocol (if indicated): fentanyl VAP protocol (if indicated): n/a DVT prophylaxis: heparin GI prophylaxis: pantoprazole Glucose control: SSI Mobility: bedrest last date of multidisciplinary goals of care discussion Family and staff present  Summary of discussion  Follow up goals of care discussion due Code Status: full Disposition: ICU  Labs   CBC: Recent Labs  Lab 12/24/19 1214 12/25/19 0235  WBC 6.4 8.7  HGB 12.5 10.1*  HCT 38.4 28.5*  MCV 105.8* 99.0  PLT 117* 88*    Basic Metabolic Panel: Recent Labs  Lab 12/24/19 1214 12/24/19 1616 12/24/19 2204 12/25/19 0235  NA 132* 136 133* 134*  K 5.1 4.4 4.1 3.2*  CL 93* 104 97* 95*  CO2 7* 9* 16* 21*  GLUCOSE 140* 123* 275* 134*  BUN 18 17 17 15   CREATININE 2.55* 2.01* 1.61* 1.18*  CALCIUM 8.3* 6.4* 7.5* 7.5*  MG  --   --   --  <1.0*  PHOS  --   --   --  <1.0*   GFR: Estimated Creatinine Clearance: 72.8 mL/min (A) (by C-G formula based on SCr of 1.18 mg/dL (H)). Recent Labs   Lab 12/24/19 1214 12/24/19 1604 12/24/19 1730 12/24/19 1934 12/24/19 2204 12/25/19 0235  WBC 6.4  --   --   --   --  8.7  LATICACIDVEN  --    < > 1.9 2.1* 2.3* 2.2*   < > = values in this interval not displayed.    Liver Function Tests: Recent Labs  Lab 12/24/19 1214 12/25/19 0235  AST 1,634* 855*  ALT 221* 135*  ALKPHOS 112 61  BILITOT 3.9* 2.8*  PROT 9.1* 6.8  ALBUMIN 4.9 3.4*   Recent Labs  Lab 12/24/19 1214 12/24/19 1333 12/25/19 0235  LIPASE 1,184* 1,074* 104*   Recent Labs  Lab 12/24/19 1730  AMMONIA 107*    ABG    Component Value Date/Time   TCO2 27 11/25/2007 0635     Coagulation Profile: Recent Labs  Lab 12/24/19 1730 12/25/19 0235  INR 1.1 1.2    Cardiac Enzymes: No results for input(s): CKTOTAL, CKMB, CKMBINDEX, TROPONINI in the last 168 hours.  HbA1C: Hgb A1c MFr Bld  Date/Time Value Ref Range Status  01/05/2019 04:14 AM 4.9 4.8 - 5.6 % Final    Comment:    (NOTE) Pre diabetes:          5.7%-6.4% Diabetes:              >6.4% Glycemic  control for   <7.0% adults with diabetes   05/25/2018 04:03 AM 5.0 4.8 - 5.6 % Final    Comment:    (NOTE) Pre diabetes:          5.7%-6.4% Diabetes:              >6.4% Glycemic control for   <7.0% adults with diabetes     CBG: Recent Labs  Lab 12/24/19 1731 12/24/19 1945 12/24/19 2351 12/25/19 0412 12/25/19 0812  GLUCAP 136* 166* 229* 122* 103*     This patient is critically ill with multiple organ system failure which requires frequent high complexity decision making, assessment, support, evaluation, and titration of therapies. This was completed through the application of advanced monitoring technologies and extensive interpretation of multiple databases. During this encounter critical care time was devoted to patient care services described in this note for 34 minutes.  Julian Hy, DO 12/25/19 8:26 AM [facility]

## 2019-12-26 DIAGNOSIS — K851 Biliary acute pancreatitis without necrosis or infection: Secondary | ICD-10-CM

## 2019-12-26 DIAGNOSIS — K8500 Idiopathic acute pancreatitis without necrosis or infection: Secondary | ICD-10-CM

## 2019-12-26 LAB — BASIC METABOLIC PANEL
Anion gap: 16 — ABNORMAL HIGH (ref 5–15)
Anion gap: 12 (ref 5–15)
BUN: 6 mg/dL (ref 6–20)
BUN: <5 mg/dL — ABNORMAL LOW (ref 6–20)
CO2: 27 mmol/L (ref 22–32)
CO2: 31 mmol/L (ref 22–32)
Calcium: 7.2 mg/dL — ABNORMAL LOW (ref 8.9–10.3)
Calcium: 8 mg/dL — ABNORMAL LOW (ref 8.9–10.3)
Chloride: 82 mmol/L — ABNORMAL LOW (ref 98–111)
Chloride: 92 mmol/L — ABNORMAL LOW (ref 98–111)
Creatinine, Ser: 0.69 mg/dL (ref 0.44–1.00)
Creatinine, Ser: 0.64 mg/dL (ref 0.44–1.00)
GFR, Estimated: >60 mL/min (ref >60)
GFR, Estimated: >60 mL/min (ref >60)
Glucose, Bld: 488 mg/dL — ABNORMAL HIGH (ref 70–99)
Glucose, Bld: 136 mg/dL — ABNORMAL HIGH (ref 70–99)
Potassium: >7.5 mmol/L (ref 3.5–5.1)
Potassium: 3.5 mmol/L (ref 3.5–5.1)
Sodium: 125 mmol/L — ABNORMAL LOW (ref 135–145)
Sodium: 135 mmol/L (ref 135–145)

## 2019-12-26 LAB — COMPREHENSIVE METABOLIC PANEL
ALT: 91 U/L — ABNORMAL HIGH (ref 0–44)
AST: 279 U/L — ABNORMAL HIGH (ref 15–41)
Albumin: 3.3 g/dL — ABNORMAL LOW (ref 3.5–5.0)
Alkaline Phosphatase: 72 U/L (ref 38–126)
Anion gap: 20 — ABNORMAL HIGH (ref 5–15)
BUN: 9 mg/dL (ref 6–20)
CO2: 25 mmol/L (ref 22–32)
Calcium: 7.8 mg/dL — ABNORMAL LOW (ref 8.9–10.3)
Chloride: 88 mmol/L — ABNORMAL LOW (ref 98–111)
Creatinine, Ser: 0.73 mg/dL (ref 0.44–1.00)
GFR, Estimated: >60 mL/min (ref >60)
Glucose, Bld: 121 mg/dL — ABNORMAL HIGH (ref 70–99)
Potassium: 2.9 mmol/L — ABNORMAL LOW (ref 3.5–5.1)
Sodium: 133 mmol/L — ABNORMAL LOW (ref 135–145)
Total Bilirubin: 2.3 mg/dL — ABNORMAL HIGH (ref 0.3–1.2)
Total Protein: 6.8 g/dL (ref 6.5–8.1)

## 2019-12-26 LAB — GLUCOSE, CAPILLARY
Glucose-Capillary: 115 mg/dL — ABNORMAL HIGH (ref 70–99)
Glucose-Capillary: 125 mg/dL — ABNORMAL HIGH (ref 70–99)
Glucose-Capillary: 128 mg/dL — ABNORMAL HIGH (ref 70–99)
Glucose-Capillary: 142 mg/dL — ABNORMAL HIGH (ref 70–99)
Glucose-Capillary: 142 mg/dL — ABNORMAL HIGH (ref 70–99)

## 2019-12-26 LAB — CBC
HCT: 29.3 % — ABNORMAL LOW (ref 36.0–46.0)
Hemoglobin: 10.6 g/dL — ABNORMAL LOW (ref 12.0–15.0)
MCH: 35.5 pg — ABNORMAL HIGH (ref 26.0–34.0)
MCHC: 36.2 g/dL — ABNORMAL HIGH (ref 30.0–36.0)
MCV: 98 fL (ref 80.0–100.0)
Platelets: 98 10*3/uL — ABNORMAL LOW (ref 150–400)
RBC: 2.99 MIL/uL — ABNORMAL LOW (ref 3.87–5.11)
RDW: 13.6 % (ref 11.5–15.5)
WBC: 11.7 10*3/uL — ABNORMAL HIGH (ref 4.0–10.5)
nRBC: 0 % (ref 0.0–0.2)

## 2019-12-26 LAB — URINE CULTURE

## 2019-12-26 LAB — MAGNESIUM: Magnesium: 2.4 mg/dL (ref 1.7–2.4)

## 2019-12-26 LAB — LACTIC ACID, PLASMA: Lactic Acid, Venous: 1.3 mmol/L (ref 0.5–1.9)

## 2019-12-26 LAB — PROTIME-INR
INR: 1 (ref 0.8–1.2)
Prothrombin Time: 12.4 seconds (ref 11.4–15.2)

## 2019-12-26 LAB — AMMONIA: Ammonia: 42 umol/L — ABNORMAL HIGH (ref 9–35)

## 2019-12-26 LAB — CALCIUM, IONIZED: Calcium, Ionized, Serum: 4 mg/dL — ABNORMAL LOW (ref 4.5–5.6)

## 2019-12-26 LAB — PHOSPHORUS
Phosphorus: 0.8 mg/dL — CL (ref 2.5–4.6)
Phosphorus: 19.7 mg/dL — ABNORMAL HIGH (ref 2.5–4.6)

## 2019-12-26 MED ORDER — PROSIGHT PO TABS
1.0000 | ORAL_TABLET | Freq: Every day | ORAL | Status: DC
  Administered 2019-12-27 – 2019-12-28 (×2): 1 via ORAL
  Filled 2019-12-26 (×2): qty 1

## 2019-12-26 MED ORDER — OXYCODONE HCL 5 MG PO TABS
5.0000 mg | ORAL_TABLET | ORAL | Status: DC | PRN
  Administered 2019-12-26 – 2019-12-27 (×3): 5 mg via ORAL
  Filled 2019-12-26 (×3): qty 1

## 2019-12-26 MED ORDER — LACTATED RINGERS IV BOLUS
1000.0000 mL | Freq: Once | INTRAVENOUS | Status: AC
  Administered 2019-12-26: 10:00:00 1000 mL via INTRAVENOUS

## 2019-12-26 MED ORDER — POTASSIUM CHLORIDE CRYS ER 20 MEQ PO TBCR
40.0000 meq | EXTENDED_RELEASE_TABLET | Freq: Once | ORAL | Status: AC
  Administered 2019-12-26: 09:00:00 40 meq via ORAL
  Filled 2019-12-26: qty 2

## 2019-12-26 MED ORDER — POTASSIUM PHOSPHATES 15 MMOLE/5ML IV SOLN
30.0000 mmol | Freq: Once | INTRAVENOUS | Status: AC
  Administered 2019-12-26: 12:00:00 30 mmol via INTRAVENOUS
  Filled 2019-12-26: qty 10

## 2019-12-26 MED ORDER — POTASSIUM PHOSPHATES 15 MMOLE/5ML IV SOLN
30.0000 mmol | Freq: Once | INTRAVENOUS | Status: AC
  Administered 2019-12-26: 05:00:00 30 mmol via INTRAVENOUS
  Filled 2019-12-26: qty 10

## 2019-12-26 MED ORDER — FOLIC ACID 1 MG PO TABS
1.0000 mg | ORAL_TABLET | Freq: Every day | ORAL | Status: DC
  Administered 2019-12-26 – 2019-12-28 (×3): 1 mg via ORAL
  Filled 2019-12-26 (×3): qty 1

## 2019-12-26 MED ORDER — POTASSIUM CHLORIDE CRYS ER 20 MEQ PO TBCR: 40.0000 meq | EXTENDED_RELEASE_TABLET | ORAL | Status: DC

## 2019-12-26 MED ORDER — THIAMINE HCL 100 MG PO TABS
100.0000 mg | ORAL_TABLET | Freq: Every day | ORAL | Status: DC
  Administered 2019-12-26 – 2019-12-28 (×3): 100 mg via ORAL
  Filled 2019-12-26 (×3): qty 1

## 2019-12-26 MED ORDER — LACTATED RINGERS IV SOLN: INTRAVENOUS | Status: DC

## 2019-12-26 NOTE — TOC Initial Note (Signed)
Transition of Care [facility]) - Initial/Assessment Note    Patient Details  Name: Yolanda Flores MRN: [id number] Date of Birth: 04/03/1985  Transition of Care [facility]) CM/SW Contact:    Leeroy Cha, RN Phone Number: 12/26/2019, 7:38 AM  Clinical Narrative:                 34 y/o woman with a history of HTN and gallstone vs ETOH-induced pancreatitis in December 2020 who presents with 3 days of abdominal pain.  History is obtained from her boyfriend at bedside due to patient's confusion.  She originally attributed the pain to constipation, which was not relieved with MiraLAX.  The pain became sharp and more severe.  She has been vomiting continuously since yesterday.  She is up-to-date on Covid vaccines and had her flu shot a week ago.  She drinks about 4 days out of the week and takes a few shots each day, but has not recently had any days of significant drinking above average.  She does not drink on a daily basis.  Last alcohol intake 4 days ago.  Records from previous hospitalization reviewed-in December 2020 she had circulatory shock, ATN complicating her admission.   Per her mother [name] (in the chart listed as Vernita) she has been drinking significantly for the past 1 year.   Boyfriend at bedside Maylene Roes [phone] PLAN-to return to home with wtoh resources Following for progression. Expected Discharge Plan: Home/Self Care Barriers to Discharge: No Barriers Identified   Patient Goals and CMS Choice Patient states their goals for this hospitalization and ongoing recovery are:: to go back home and get clean CMS Medicare.gov Compare Post Acute Care list provided to:: Patient    Expected Discharge Plan and Services Expected Discharge Plan: Home/Self Care   Discharge Planning Services: CM Consult   Living arrangements for the past 2 months: Apartment                                      Prior Living Arrangements/Services Living arrangements for the past 2 months:  Apartment Lives with:: Self Patient language and need for interpreter reviewed:: Yes Do you feel safe going back to the place where you live?: Yes      Need for Family Participation in Patient Care: Yes (Comment) Care giver support system in place?: Yes (comment)   Criminal Activity/Legal Involvement Pertinent to Current Situation/Hospitalization: No - Comment as needed  Activities of Daily Living Home Assistive Devices/Equipment: None ADL Screening (condition at time of admission) Patient's cognitive ability adequate to safely complete daily activities?: Yes Is the patient deaf or have difficulty hearing?: No Does the patient have difficulty seeing, even when wearing glasses/contacts?: No Does the patient have difficulty concentrating, remembering, or making decisions?: No Patient able to express need for assistance with ADLs?: Yes Does the patient have difficulty dressing or bathing?: No Independently performs ADLs?: Yes (appropriate for developmental age) Does the patient have difficulty walking or climbing stairs?: No Weakness of Legs: Both Weakness of Arms/Hands: Both  Permission Sought/Granted                  Emotional Assessment Appearance:: Appears stated age Attitude/Demeanor/Rapport: Apprehensive Affect (typically observed): Calm Orientation: : Oriented to Place,Oriented to Self,Oriented to  Time,Oriented to Situation Alcohol / Substance Use: Alcohol Use    Admission diagnosis:  Acute pancreatitis [K85.90] Pancreatitis, acute [K85.90] Hepatitis [K75.9] Alcohol withdrawal syndrome without complication ([place]) [  F10.230] Abdominal pain, unspecified abdominal location [R10.9] Acute pancreatitis, unspecified complication status, unspecified pancreatitis type [K85.90] Patient Active Problem List   Diagnosis Date Noted  . Pancreatitis, acute 12/24/2019  . Acute pancreatitis 12/24/2019  . Normocytic anemia 01/17/2019  . Thrombocytosis 01/17/2019  . Heartburn   .  Odynophagia   . Acute renal failure ([place]) 01/08/2019  . Shock circulatory ([place]) 01/08/2019  . Acute gallstone pancreatitis 01/04/2019  . Pancreatitis 01/04/2019  . Chest pain 05/24/2018  . HTN (hypertension) 05/24/2018  . Hypokalemia 05/24/2018  . Lactic acidosis 05/24/2018  . Tobacco abuse 05/24/2018  . Alcohol use disorder, mild, in early remission 05/24/2018  . Abnormal LFTs 05/24/2018  . Prolonged QT interval    PCP:  Ladell Pier, MD Pharmacy:   [facility], [place] [address] Phone: [phone] Fax: [place], [place] [address] Phone: [phone] Fax: [phone]     Social Determinants of Health (SDOH) Interventions    Readmission Risk Interventions No flowsheet data found.

## 2019-12-26 NOTE — Plan of Care (Signed)
  Problem: Education: Goal: Knowledge of Pancreatitis treatment and prevention will improve Outcome: Progressing   Problem: Health Behavior/Discharge Planning: Goal: Ability to formulate a plan to maintain an alcohol-free life will improve Outcome: Progressing   Problem: Nutritional: Goal: Ability to achieve adequate nutritional intake will improve Outcome: Progressing   Problem: Clinical Measurements: Goal: Complications related to the disease process, condition or treatment will be avoided or minimized Outcome: Progressing   

## 2019-12-26 NOTE — Progress Notes (Addendum)
[facility] Progress Note Patient Name: Yolanda Flores DOB: 1985-06-09 MRN: [id number]   Date of Service  12/26/2019  HPI/Events of Note  Multiple issues: 1. Hypokalemia - K+ = 2.9 and Creatinine = 0.73, 2. Hypophosphatemia - PO4--- = 0.8  eICU Interventions  Plan: 1. Replace K+ and PO4---. 2. Repeat BMP and Phosphorus level at 12 noon.      Intervention Category Major Interventions: Electrolyte abnormality - evaluation and management  [name] 12/26/2019, 3:35 AM

## 2019-12-26 NOTE — Progress Notes (Addendum)
NAME:  Yolanda Flores, MRN:  [id number], DOB:  08-05-1985, LOS: 2 ADMISSION DATE:  12/24/2019, CONSULTATION DATE:  12/24/19 REFERRING MD:  Neysa Bonito, CHIEF COMPLAINT:  pancreatitis   Brief History   Acute pancreatitis, AKI, AGMA, acute liver injury  History of present illness   Ms. Yolanda Flores is a 34 y/o woman with a history of HTN and gallstone vs ETOH-induced pancreatitis in December 2020 who presents with 3 days of abdominal pain.  History is obtained from her boyfriend at bedside due to patient's confusion.  She originally attributed the pain to constipation, which was not relieved with MiraLAX.  The pain became sharp and more severe.  She has been vomiting continuously since yesterday.  She is up-to-date on Covid vaccines and had her flu shot a week ago.  She drinks about 4 days out of the week and takes a few shots each day, but has not recently had any days of significant drinking above average.  She does not drink on a daily basis.  Last alcohol intake 4 days ago.  Records from previous hospitalization reviewed-in December 2020 she had circulatory shock, ATN complicating her admission.   Per her mother Yolanda Flores (in the chart listed as Yolanda Flores) she has been drinking significantly for the past 1 year.   Boyfriend at bedside Yolanda Flores [phone]  Past Medical History  ETOH abuse- drinks about half the days of the week HTN Pancreatitis- likely due to ETOH abuse   Significant Hospital Events     Consults:  GI  Procedures:    Significant Diagnostic Tests:  CT abd: pancreatic inflammation, BG sludge but no wall edema B-hCG negative  Micro Data:  Blood cx 12/11> Urine cx 12/11>  Antimicrobials:    Interim history/subjective:  Patient reports improved nausea. Denies ABD pain.   Objective   Blood pressure 123/90, pulse 100, temperature 98.9 F (37.2 C), temperature source Oral, resp. rate 16, height 5\' 6"  (1.676 m), weight 78.1 kg, SpO2 96 %.        Intake/Output Summary (Last 24  hours) at 12/26/2019 0835 Last data filed at 12/25/2019 2152 Gross per 24 hour  Intake 1959.99 ml  Output 500 ml  Net 1459.99 ml   Filed Weights   12/24/19 1714 12/25/19 0500 12/26/19 0500  Weight: 73.6 kg 78 kg 78.1 kg    Examination: General: young adult female, lying in bed, no distress  HENT: Dry MM Lungs: Clear breath sounds, no wheeze/crackles  Cardiovascular: Tachycardic, regular rhythm Abdomen: non-distended, active bowel sounds, no tenderness reported with palpation  Extremities: -edema  Neuro: alert, oriented, follows commands  Derm: Warm, dry, no rashes  Resolved Hospital Problem list   Acute encephalopathy  Assessment & Plan:   Acute pancreatitis with MOF- likely due to ETOH.  -No gallstones visible on CT, but biliary sludge is present with significantly elevated LFTs. -RUQ [place] with no gallstones or obstruction   Plan -GI Following  -Clear liquids  -zofran PRN for nausea and vomiting  AKI, suspect pre-renal vs ATN >> Resolved  Acute metabolic acidosis  Hyponatremia & hypochloremia- hypovolemic Lactic acidosis-resolving, due to hypovolemia.  Suspect ongoing type B lactic acidosis due to acute hepatic injury.  No evidence for acute infectious process. Plan -Send Lactic Acid -Start LR at 100 ml/hr and give 1L bolus now -Trend BMP  Severe electrolyte derangements, at risk for arrhythmias Hypocalcemia Hypophosphatemia Hypomagnesemia QTC Prolongation  Plan -Trend BMP -Replacing K and Phos this AM >> Repeat BM at 1600  -Repeat EKG   Acute  liver injury- elevated LFTs, hyperbilirubinemia >>  Improving  Hyperammonemia -RUQ [place] with Hepatic Steatosis  Plan -Trend LFT. Send Ammonia (Previous 107)  -Avoid Tylenol  -GI following  -PPI Daily  Acute encephalopathy- potentially due to pain meds given in the ED vs AKI or acute liver injury. Resolved. Acute Pain  Plan -dilaudid for pain given both kidney and liver injury -holding on rectal lactulose with  resolved encephalopathy  ETOH abuse, potential for alcohol withdrawal Plan -CIWA -- Patient has not required any treatment  -supplemental vitamins  Thrombocytopenia Acute vs chronic anemia with macrocytosis, likely concentrated at presentation. Presume chronic component of ETOH-related anemia. -HIV negative Plan -Trend CBC   Hyperglycemia- controlled Plan -SSI w/ Q4h accuchecks -Glucose 140-180 if requiring insulin  Best practice (evaluated daily)   Diet: Clear Liquid  DVT prophylaxis: heparin GI prophylaxis: pantoprazole Glucose control: SSI Mobility: OOB, ambulation as tolerated  last date of multidisciplinary goals of care discussion Family and staff present  Summary of discussion  Follow up goals of care discussion due Code Status: full Disposition: May Transfer out of ICU this afternoon.   Labs   CBC: Recent Labs  Lab 12/24/19 1214 12/25/19 0235 12/26/19 0231  WBC 6.4 8.7 11.7*  HGB 12.5 10.1* 10.6*  HCT 38.4 28.5* 29.3*  MCV 105.8* 99.0 98.0  PLT 117* 88* 98*    Basic Metabolic Panel: Recent Labs  Lab 12/24/19 2204 12/25/19 0235 12/25/19 1112 12/25/19 1938 12/26/19 0231  NA 133* 134* 133* 134* 133*  K 4.1 3.2* 3.3* 3.4* 2.9*  CL 97* 95* 90* 90* 88*  CO2 16* 21* 21* 25 25  GLUCOSE 275* 134* 176* 139* 121*  BUN 17 15 13 12 9   CREATININE 1.61* 1.18* 0.96 0.91 0.73  CALCIUM 7.5* 7.5* 8.0* 7.7* 7.8*  MG  --  <1.0* 2.7* 2.5* 2.4  PHOS  --  <1.0*  --  1.4* 0.8*   GFR: Estimated Creatinine Clearance: 107.4 mL/min (by C-G formula based on SCr of 0.73 mg/dL). Recent Labs  Lab 12/24/19 1214 12/24/19 1604 12/24/19 1730 12/24/19 1934 12/24/19 2204 12/25/19 0235 12/26/19 0231  WBC 6.4  --   --   --   --  8.7 11.7*  LATICACIDVEN  --    < > 1.9 2.1* 2.3* 2.2*  --    < > = values in this interval not displayed.    Liver Function Tests: Recent Labs  Lab 12/24/19 1214 12/25/19 0235 12/26/19 0231  AST 1,634* 855* 279*  ALT 221* 135* 91*   ALKPHOS 112 61 72  BILITOT 3.9* 2.8* 2.3*  PROT 9.1* 6.8 6.8  ALBUMIN 4.9 3.4* 3.3*   Recent Labs  Lab 12/24/19 1214 12/24/19 1333 12/25/19 0235  LIPASE 1,184* 1,074* 104*   Recent Labs  Lab 12/24/19 1730  AMMONIA 107*    ABG    Component Value Date/Time   TCO2 27 11/25/2007 0635     Coagulation Profile: Recent Labs  Lab 12/24/19 1730 12/25/19 0235 12/26/19 0231  INR 1.1 1.2 1.0    Cardiac Enzymes: No results for input(s): CKTOTAL, CKMB, CKMBINDEX, TROPONINI in the last 168 hours.  HbA1C: Hgb A1c MFr Bld  Date/Time Value Ref Range Status  01/05/2019 04:14 AM 4.9 4.8 - 5.6 % Final    Comment:    (NOTE) Pre diabetes:          5.7%-6.4% Diabetes:              >6.4% Glycemic control for   <7.0% adults with  diabetes   05/25/2018 04:03 AM 5.0 4.8 - 5.6 % Final    Comment:    (NOTE) Pre diabetes:          5.7%-6.4% Diabetes:              >6.4% Glycemic control for   <7.0% adults with diabetes     CBG: Recent Labs  Lab 12/25/19 0812 12/25/19 1204 12/25/19 1724 12/25/19 1905 12/26/19 0722  GLUCAP 103* 178* 115* 131* 115*     This patient is critically ill with multiple organ system failure which requires frequent high complexity decision making, assessment, support, evaluation, and titration of therapies. This was completed through the application of advanced monitoring technologies and extensive interpretation of multiple databases. During this encounter critical care time was devoted to patient care services described in this note for 32 minutes.  Hayden Pedro, AGACNP-BC [facility]  PCCM Pgr: [phone]

## 2019-12-26 NOTE — Progress Notes (Addendum)
Progress Note  Chief Complaint:   pancreatitis      ASSESSMENT / PLAN:    # 34 yo female with history of Etoh abuse, severe etoh pancreatitis, severe hepatic steatosis  # Recurrent acute pancreatitis with AKI , metabolic acidosis, SIRS. Pancreatitis presumably secondary to Etoh. Biliary pancreatitis not suspected. She has gallbladder sludge but no biliary duct dilation and alk phos has been normal. Still slightly tachycardic,otherwise VSS. Hct 29%, AKI resolved. She is tolerating clears now --She received aggressive IV hydration ( 3 liters) on 12/24/19. She got another bolus this am and has 100 ml / hr infusing. Can reduce IVF rate now .  --Trial of full liquids tomorrow --Abstinence from Etoh going forward  # Encephalopathy, resolved.   # Elevated liver chemistries, pattern predominantly of hepatocellular. Bilirubin was 3.9. Abnormal liver chemistries likely combination of Etoh and shock. Number have significantly improved. AST 279, ALT 91, Tbili 2.3. INR remains normal.   # Acute thrombocytopenia. Etiology? Maybe component of bone marrow suppression from etoh.  She is getting SQ heparin but platelets were already low.   # Severe electrolyte derangements ( hypocalemia, hypocalcemia with low ionized Ca+, hypokalemia, hypomagnesemia, hypophostatemia. Repletion pre admitting team.       SUBJECTIVE:   No significant abdominal pain right now, last Dilaudid dose around 3 am. Tolerating clears    OBJECTIVE:    Scheduled inpatient medications:   chlorhexidine  15 mL Mouth Rinse BID   Chlorhexidine Gluconate Cloth  6 each Topical Daily   folic acid  1 mg Oral Daily   heparin  5,000 Units Subcutaneous Q8H   insulin aspart  1-3 Units Subcutaneous Q4H   mouth rinse  15 mL Mouth Rinse q12n4p   [START ON 12/27/2019] multivitamin  1 tablet Oral Daily   pantoprazole (PROTONIX) IV  40 mg Intravenous QHS   pneumococcal 23 valent vaccine  0.5 mL Intramuscular Tomorrow-1000    thiamine  100 mg Oral Daily   Continuous inpatient infusions:   lactated ringers 100 mL/hr at 12/26/19 0859   potassium PHOSPHATE IVPB (in mmol) 30 mmol (12/26/19 0516)   potassium PHOSPHATE IVPB (in mmol)     PRN inpatient medications: HYDROmorphone (DILAUDID) injection, lip balm, LORazepam **OR** LORazepam, oxyCODONE  Vital signs in last 24 hours: Temp:  [98.4 F (36.9 C)-98.9 F (37.2 C)] 98.6 F (37 C) (12/13 0800) Pulse Rate:  [96-133] 101 (12/13 1000) Resp:  [15-29] 22 (12/13 1000) BP: (106-130)/(68-97) 108/86 (12/13 1000) SpO2:  [93 %-98 %] 96 % (12/13 1000) Weight:  [78.1 kg] 78.1 kg (12/13 0500) Last BM Date: 12/25/19  Intake/Output Summary (Last 24 hours) at 12/26/2019 1057 Last data filed at 12/25/2019 2152 Gross per 24 hour  Intake 1490.31 ml  Output 500 ml  Net 990.31 ml     Physical Exam:   General: Alert female in NAD  Heart:  Sinus tachycardic.  No lower extremity edema  Pulmonary: Normal respiratory effort  Abdomen: Soft, nondistended, nontender. Normal bowel sounds.   Neurologic: Alert and oriented  Psych: Pleasant. Cooperative.   Filed Weights   12/24/19 1714 12/25/19 0500 12/26/19 0500  Weight: 73.6 kg 78 kg 78.1 kg    Intake/Output from previous day: 12/12 0701 - 12/13 0700 In: 2747.8 [P.O.:360; I.V.:1226.9; IV Piggyback:1160.9] Out: 500 [Urine:500] Intake/Output this shift: No intake/output data recorded.    Lab Results: Recent Labs    12/24/19 1214 12/25/19 0235 12/26/19 0231  WBC 6.4 8.7 11.7*  HGB 12.5 10.1* 10.6*  HCT  38.4 28.5* 29.3*  PLT 117* 88* 98*   BMET Recent Labs    12/25/19 1112 12/25/19 1938 12/26/19 0231  NA 133* 134* 133*  K 3.3* 3.4* 2.9*  CL 90* 90* 88*  CO2 21* 25 25  GLUCOSE 176* 139* 121*  BUN 13 12 9   CREATININE 0.96 0.91 0.73  CALCIUM 8.0* 7.7* 7.8*   LFT Recent Labs    12/26/19 0231  PROT 6.8  ALBUMIN 3.3*  AST 279*  ALT 91*  ALKPHOS 72  BILITOT 2.3*   PT/INR Recent Labs     12/25/19 0235 12/26/19 0231  LABPROT 14.3 12.4  INR 1.2 1.0   Hepatitis Panel No results for input(s): HEPBSAG, HCVAB, HEPAIGM, HEPBIGM in the last 72 hours.  CT ABDOMEN PELVIS WO CONTRAST  Result Date: 12/24/2019 CLINICAL DATA:  34 year old female with history of epigastric pain for the past 2 days. EXAM: CT ABDOMEN AND PELVIS WITHOUT CONTRAST TECHNIQUE: Multidetector CT imaging of the abdomen and pelvis was performed following the standard protocol without IV contrast. COMPARISON:  CT the abdomen and pelvis 01/13/2019. FINDINGS: Lower chest: Scattered areas of mild scarring are noted in the lower lobes of the lungs bilaterally. Hepatobiliary: Diffuse low attenuation throughout the hepatic parenchyma, indicative of hepatic steatosis. Low-attenuation lesions anteriorly in the liver measuring up to 1.2 cm in diameter in segment 4B adjacent to the falciform ligament, incompletely characterized on today's non-contrast CT examination, but statistically likely to represent cysts. Intermediate attenuation amorphous material lying dependently in the gallbladder, compatible with biliary sludge. Gallbladder is only mildly distended, without surrounding inflammatory changes to clearly indicate an acute cholecystitis. Pancreas: Inflammatory changes noted adjacent to the pancreas, most evident adjacent to the head and proximal body, concerning for acute pancreatitis. Spleen: Unremarkable. Adrenals/Urinary Tract: Unenhanced appearance of the kidneys and bilateral adrenal glands is normal. No hydroureteronephrosis. Urinary bladder is unremarkable in appearance. Stomach/Bowel: Unenhanced appearance of the stomach is normal. There is no pathologic dilatation of small bowel or colon. Vascular/Lymphatic: No atherosclerotic calcifications in the abdominal aorta or pelvic vasculature. No definite lymphadenopathy noted in the abdomen or pelvis on today's noncontrast CT examination. Reproductive: Unenhanced appearance of the  uterus and ovaries is unremarkable. Other: No significant volume of ascites.  No pneumoperitoneum. Musculoskeletal: There are no aggressive appearing lytic or blastic lesions noted in the visualized portions of the skeleton. IMPRESSION: 1. Inflammatory changes surrounding the pancreas concerning for acute pancreatitis. Correlation with lipase levels is strongly recommended. 2. Severe hepatic steatosis. 3. Biliary sludge lying dependently in the gallbladder. No definite imaging findings to suggest an acute cholecystitis at this time. Electronically Signed   By: Vinnie Langton M.D.   On: 12/24/2019 15:00   DG Chest Port 1 View  Result Date: 12/24/2019 CLINICAL DATA:  34 year old female with history of upper abdominal pain and dyspnea for the past 2 days. EXAM: PORTABLE CHEST 1 VIEW COMPARISON:  Chest x-ray 01/17/2019. FINDINGS: Lung volumes are normal. No consolidative airspace disease. No pleural effusions. No pneumothorax. No pulmonary nodule or mass noted. Pulmonary vasculature and the cardiomediastinal silhouette are within normal limits. IMPRESSION: No radiographic evidence of acute cardiopulmonary disease. Electronically Signed   By: Vinnie Langton M.D.   On: 12/24/2019 14:54   US Abdomen Limited RUQ (LIVER/GB)  Result Date: 12/25/2019 CLINICAL DATA:  Acute pancreatitis EXAM: ULTRASOUND ABDOMEN LIMITED RIGHT UPPER QUADRANT COMPARISON:  12/24/2019 FINDINGS: Gallbladder: Gallbladder distension noted without gallstones or wall thickening. Wall thickness 2 mm. No Murphy's sign elicited during the exam. Common bile duct:  Diameter: 4 mm Liver: Increased echogenicity compatible with hepatic steatosis. No large focal hepatic abnormality or intrahepatic biliary dilatation. No surrounding free fluid or ascites. Portal vein is patent on color Doppler imaging with normal direction of blood flow towards the liver. Other: Included views of the right kidney demonstrate no hydronephrosis. IMPRESSION: Negative for  gallstones. No biliary dilatation or obstruction Hepatic steatosis Electronically Signed   By: Jerilynn Mages.  Shick M.D.   On: 12/25/2019 12:04      Active Problems:   Pancreatitis, acute   Acute pancreatitis     LOS: 2 days   Yolanda Flores ,NP 12/26/2019, 10:57 AM    Attending physician's note   I have taken an interval history, reviewed the chart and examined the patient. I agree with the Advanced Practitioner's note, impression and recommendations.    Acute ETOH pancreatitis with SIRS. No pancreatic necrosis or fluid collection. Neg [place] for gallstones and previous negative MRCP.  Similar presentation December 2020. 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  No matter of cirrhosis currently.  No clinical hepatic encephalopathy or coagulopathy.  He does have thrombocytopenia ?Etiology. AST:ALT ratio c/w ETOH liver disease. DF<32.  No need for steroids.  ETOH abuse.   Plan: -Decrease IV fluids. -Clinically, she is much better.  Trend LFTs, lipase, electrolytes. -Advance diet to full liquids in a.m. -Ambulate -Continue supportive treatment -Again discussed regarding alcohol abstinence. -Recommend FU with behavioral health as outpt for underlying ?Depression/EtOH abuse.   Carmell Austria, MD [facility] [phone]

## 2019-12-26 NOTE — Progress Notes (Signed)
[facility] PCCM  Called for hyperkalemia KPhos was running Rest of the labs are not consistent with AM lab trend Send stat repeat BMET now Check 12 lead  Roselie Awkward, MD [facility] PCCM Pager: [phone] Cell: [phone] If no response, call [phone]

## 2019-12-27 DIAGNOSIS — R109 Unspecified abdominal pain: Secondary | ICD-10-CM

## 2019-12-27 DIAGNOSIS — D539 Nutritional anemia, unspecified: Secondary | ICD-10-CM | POA: Diagnosis present

## 2019-12-27 DIAGNOSIS — F1011 Alcohol abuse, in remission: Secondary | ICD-10-CM

## 2019-12-27 DIAGNOSIS — R945 Abnormal results of liver function studies: Secondary | ICD-10-CM

## 2019-12-27 LAB — GLUCOSE, CAPILLARY
Glucose-Capillary: 101 mg/dL — ABNORMAL HIGH (ref 70–99)
Glucose-Capillary: 117 mg/dL — ABNORMAL HIGH (ref 70–99)
Glucose-Capillary: 119 mg/dL — ABNORMAL HIGH (ref 70–99)
Glucose-Capillary: 125 mg/dL — ABNORMAL HIGH (ref 70–99)
Glucose-Capillary: 130 mg/dL — ABNORMAL HIGH (ref 70–99)
Glucose-Capillary: 138 mg/dL — ABNORMAL HIGH (ref 70–99)
Glucose-Capillary: 140 mg/dL — ABNORMAL HIGH (ref 70–99)
Glucose-Capillary: 168 mg/dL — ABNORMAL HIGH (ref 70–99)
Glucose-Capillary: 71 mg/dL (ref 70–99)

## 2019-12-27 LAB — MAGNESIUM: Magnesium: 1.7 mg/dL (ref 1.7–2.4)

## 2019-12-27 LAB — COMPREHENSIVE METABOLIC PANEL
ALT: 73 U/L — ABNORMAL HIGH (ref 0–44)
AST: 193 U/L — ABNORMAL HIGH (ref 15–41)
Albumin: 3.3 g/dL — ABNORMAL LOW (ref 3.5–5.0)
Alkaline Phosphatase: 90 U/L (ref 38–126)
Anion gap: 13 (ref 5–15)
BUN: <5 mg/dL — ABNORMAL LOW (ref 6–20)
CO2: 29 mmol/L (ref 22–32)
Calcium: 8.1 mg/dL — ABNORMAL LOW (ref 8.9–10.3)
Chloride: 96 mmol/L — ABNORMAL LOW (ref 98–111)
Creatinine, Ser: 0.55 mg/dL (ref 0.44–1.00)
GFR, Estimated: >60 mL/min (ref >60)
Glucose, Bld: 114 mg/dL — ABNORMAL HIGH (ref 70–99)
Potassium: 3.5 mmol/L (ref 3.5–5.1)
Sodium: 138 mmol/L (ref 135–145)
Total Bilirubin: 1.6 mg/dL — ABNORMAL HIGH (ref 0.3–1.2)
Total Protein: 6.5 g/dL (ref 6.5–8.1)

## 2019-12-27 LAB — CBC
HCT: 28.5 % — ABNORMAL LOW (ref 36.0–46.0)
Hemoglobin: 9.8 g/dL — ABNORMAL LOW (ref 12.0–15.0)
MCH: 35.1 pg — ABNORMAL HIGH (ref 26.0–34.0)
MCHC: 34.4 g/dL (ref 30.0–36.0)
MCV: 102.2 fL — ABNORMAL HIGH (ref 80.0–100.0)
Platelets: 134 10*3/uL — ABNORMAL LOW (ref 150–400)
RBC: 2.79 MIL/uL — ABNORMAL LOW (ref 3.87–5.11)
RDW: 13.9 % (ref 11.5–15.5)
WBC: 8.7 10*3/uL (ref 4.0–10.5)
nRBC: 0 % (ref 0.0–0.2)

## 2019-12-27 LAB — HEPATITIS PANEL, ACUTE
HCV Ab: NONREACTIVE
Hep A IgM: NONREACTIVE
Hep B C IgM: NONREACTIVE
Hepatitis B Surface Ag: NONREACTIVE

## 2019-12-27 LAB — FOLATE: Folate: 14 ng/mL (ref >5.9)

## 2019-12-27 LAB — PHOSPHORUS: Phosphorus: 2.1 mg/dL — ABNORMAL LOW (ref 2.5–4.6)

## 2019-12-27 LAB — VITAMIN B12: Vitamin B-12: 632 pg/mL (ref 180–914)

## 2019-12-27 MED ORDER — SENNOSIDES-DOCUSATE SODIUM 8.6-50 MG PO TABS
2.0000 | ORAL_TABLET | Freq: Two times a day (BID) | ORAL | Status: DC
  Filled 2019-12-27 (×2): qty 2

## 2019-12-27 MED ORDER — PANTOPRAZOLE SODIUM 40 MG PO TBEC
40.0000 mg | DELAYED_RELEASE_TABLET | Freq: Every day | ORAL | Status: DC
  Administered 2019-12-27: 22:00:00 40 mg via ORAL
  Filled 2019-12-27: qty 1

## 2019-12-27 NOTE — Progress Notes (Signed)
[facility]  PROGRESS NOTE  Yolanda Flores ERD:[id number] DOB: 1985/10/04 DOA: 12/24/2019 PCP: Ladell Pier, MD Admit date - 12/24/2019   Admitting Physician Julian Hy, DO  Outpatient Primary MD for the patient is Ladell Pier, MD  LOS - 3 Brief Narrative  Yolanda Flores is a 34 year old female with medical history significant for alcohol abuse, pancreatitis secondary to alcohol abuse, and HTN with gallstones who presented on 12/24/2019 with 3 days of abdominal pain, vomiting, confusion reported increased binge drinking.  She was initially admitted to the critical care service for pancreatitis likely due to alcohol abuse (confirmed on CT abdomen, initial lipase 5631) complicated by several electrolyte derangements, AKI and acute liver injury.  Received supportive care in stepdown unit with IV fluids, correction of electrolyte abnormalities, and was transferred to [facility] service on 12/27/2019.  Subjective  Today she reports she is able to tolerate full liquid diet.  Has occasional abdominal pain but she thinks is more because she is hungry.  Denies any nausea or vomiting.  Having bowel movements A & P   Acute pancreatitis likely alcohol related.  No local complications on CT abdomen, right upper quadrant ultrasound ruled out gallstones, LFTs downtrending, tolerated liquid diet, hypocalcemia improved with supplementation -Advance to soft diet, GI recommends low-fat diet for several weeks -Discontinue IV fluids -Continue to encourage alcohol abuse cessation -Pain control with IV Dilaudid and oxycodone IR as needed AKI, likely prerenal in the setting of diminished p.o. intake, vomiting are related to acute pancreatitis.  Had slight lactic acidosis of 2.1 that is now resolved -Continue to encourage p.o. intake -Monitor BMP  Acute liver injury, elevated LFTs and hyperbilirubinemia on presentation suspect related to alcohol use and hypotension on admission (86/57).  "Ultrasound  showed no gallstones, no evidence of cirrhosis, does have hepatic steatosis.  AST is downtrending from 855 to 193 ALT down from 135 to 73 -Avoid hepatotoxins -monitor CMP -Check of hepatitis panel for completion of work-up  Acute encephalopathy, resolved.  Likely multifactorial etiology including severe electrolyte derangements, AKI, significant pain for acute pancreatitis.  Now resolved.  Alert oriented x4.  Ammonia was elevated in the 100s.  Ultrasound was negative for cirrhosis but did show hepatic steatosis -No need for lactulose given resolution in encephalopathy  Severe electrolytes derangements (hypercalcemia, hypokalemia, hypomagnesemia, hypophosphatemia admission), now resolved with supplementation and IV fluids  Chronic macrocytic anemia, stable at baseline.  Suspect likely related to nutritional deficiencies (folate versus B12) baseline hemoglobin 9-10.  Has had no bleeding episodes during hospital stay -Monitor CBC -Check folate, B12  Alcohol abuse.  Showed no signs of withdrawal during hospital stay. -Continue to monitor on CIWA protocol -Multivitamin, folic acid, thiamine  Thrombocytopenia.  Likely due to hepatic steatosis.  HIV negative -Monitor CBC  Hypertension, at goal. -Home lisinopril held on admission due to AKI -Held home amlodipine, hydralazine due to hypotension on admission -Continue to monitor  GERD, stable -continue home PPI       Family Communication  : None  Code Status : Full  Disposition Plan  :  Patient is from home. Anticipated d/c date:  1-2 days. Barriers to d/c or necessity for inpatient status:  Continue to monitor ability to tolerate diet without any worsening of pancreatitis or further derangements electrolytes Consults  : PCCM, GI  Procedures  :    DVT Prophylaxis  : Heparin  MDM: The below labs and imaging reports were reviewed and summarized above.  Medication management as above.  Lab Results  Component Value Date   PLT 134  (L) 12/27/2019    Diet :  Diet Order            DIET SOFT Room service appropriate? Yes; Fluid consistency: Thin  Diet effective now                  Inpatient Medications Scheduled Meds: . chlorhexidine  15 mL Mouth Rinse BID  . Chlorhexidine Gluconate Cloth  6 each Topical Daily  . folic acid  1 mg Oral Daily  . heparin  5,000 Units Subcutaneous Q8H  . insulin aspart  1-3 Units Subcutaneous Q4H  . mouth rinse  15 mL Mouth Rinse q12n4p  . multivitamin  1 tablet Oral Daily  . pantoprazole  40 mg Oral QHS  . pneumococcal 23 valent vaccine  0.5 mL Intramuscular Tomorrow-1000  . thiamine  100 mg Oral Daily   Continuous Infusions: PRN Meds:.HYDROmorphone (DILAUDID) injection, lip balm, LORazepam **OR** LORazepam, oxyCODONE  Antibiotics  :   Anti-infectives (From admission, onward)   None       Objective   Vitals:   12/27/19 0432 12/27/19 0800 12/27/19 1200 12/27/19 1438  BP: (!) 117/94 120/90 118/90 (!) 118/94  Pulse: 92 89 90 (!) 105  Resp: 20   14  Temp: 98.4 F (36.9 C)   99 F (37.2 C)  TempSrc: Oral   Oral  SpO2: 95%   99%  Weight: 77.9 kg     Height:        SpO2: 99 %  Wt Readings from Last 3 Encounters:  12/27/19 77.9 kg  02/17/19 74.4 kg  02/07/19 74.3 kg     Intake/Output Summary (Last 24 hours) at 12/27/2019 1529 Last data filed at 12/27/2019 1525 Gross per 24 hour  Intake 2007.88 ml  Output --  Net 2007.88 ml    Physical Exam:     Awake Alert, Oriented X 3, Normal affect No new F.N deficits,  [place].AT, Normal respiratory effort on room air, CTAB RRR,No Gallops,Rubs or new Murmurs,  +ve B.Sounds, Abd Soft, minimal tenderness with deep palpation of abdomen, no rebound tenderness, no guarding    I have personally reviewed the following:   Data Reviewed:  CBC Recent Labs  Lab 12/24/19 1214 12/25/19 0235 12/26/19 0231 12/27/19 0412  WBC 6.4 8.7 11.7* 8.7  HGB 12.5 10.1* 10.6* 9.8*  HCT 38.4 28.5* 29.3* 28.5*  PLT 117* 88*  98* 134*  MCV 105.8* 99.0 98.0 102.2*  MCH 34.4* 35.1* 35.5* 35.1*  MCHC 32.6 35.4 36.2* 34.4  RDW 13.5 13.1 13.6 13.9    Chemistries  Recent Labs  Lab 12/24/19 1214 12/24/19 1616 12/25/19 0235 12/25/19 1112 12/25/19 1938 12/26/19 0231 12/26/19 1537 12/26/19 1758 12/27/19 0412  NA 132*   < > 134* 133* 134* 133* 125* 135 138  K 5.1   < > 3.2* 3.3* 3.4* 2.9* >7.5* 3.5 3.5  CL 93*   < > 95* 90* 90* 88* 82* 92* 96*  CO2 7*   < > 21* 21* 25 25 27 31 29   GLUCOSE 140*   < > 134* 176* 139* 121* 488* 136* 114*  BUN 18   < > 15 13 12 9 6  <5* <5*  CREATININE 2.55*   < > 1.18* 0.96 0.91 0.73 0.69 0.64 0.55  CALCIUM 8.3*   < > 7.5* 8.0* 7.7* 7.8* 7.2* 8.0* 8.1*  MG  --   --  <1.0* 2.7* 2.5* 2.4  --   --  1.7  AST 1,634*  --  855*  --   --  279*  --   --  193*  ALT 221*  --  135*  --   --  91*  --   --  73*  ALKPHOS 112  --  61  --   --  72  --   --  90  BILITOT 3.9*  --  2.8*  --   --  2.3*  --   --  1.6*   < > = values in this interval not displayed.   ------------------------------------------------------------------------------------------------------------------ Recent Labs    12/24/19 1616  TRIG 758*    Lab Results  Component Value Date   HGBA1C 4.9 01/05/2019   ------------------------------------------------------------------------------------------------------------------ No results for input(s): TSH, T4TOTAL, T3FREE, THYROIDAB in the last 72 hours.  Invalid input(s): FREET3 ------------------------------------------------------------------------------------------------------------------ No results for input(s): VITAMINB12, FOLATE, FERRITIN, TIBC, IRON, RETICCTPCT in the last 72 hours.  Coagulation profile Recent Labs  Lab 12/24/19 1730 12/25/19 0235 12/26/19 0231  INR 1.1 1.2 1.0    No results for input(s): DDIMER in the last 72 hours.  Cardiac Enzymes No results for input(s): CKMB, TROPONINI, MYOGLOBIN in the last 168 hours.  Invalid input(s):  CK ------------------------------------------------------------------------------------------------------------------ No results found for: BNP  Micro Results Recent Results (from the past 240 hour(s))  Resp Panel by RT-PCR (Flu A&B, Covid) Nasopharyngeal Swab     Status: None   Collection Time: 12/24/19  1:32 PM   Specimen: Nasopharyngeal Swab; Nasopharyngeal(NP) swabs in vial transport medium  Result Value Ref Range Status   SARS Coronavirus 2 by RT PCR NEGATIVE NEGATIVE Final    Comment: (NOTE) SARS-CoV-2 target nucleic acids are NOT DETECTED.  The SARS-CoV-2 RNA is generally detectable in upper respiratory specimens during the acute phase of infection. The lowest concentration of SARS-CoV-2 viral copies this assay can detect is 138 copies/mL. A negative result does not preclude SARS-Cov-2 infection and should not be used as the sole basis for treatment or other patient management decisions. A negative result may occur with  improper specimen collection/handling, submission of specimen other than nasopharyngeal swab, presence of viral mutation(s) within the areas targeted by this assay, and inadequate number of viral copies(<138 copies/mL). A negative result must be combined with clinical observations, patient history, and epidemiological information. The expected result is Negative.  Fact Sheet for Patients:  EntrepreneurPulse.com.au  Fact Sheet for Healthcare Providers:  IncredibleEmployment.be  This test is no t yet approved or cleared by the [place] FDA and  has been authorized for detection and/or diagnosis of SARS-CoV-2 by FDA under an Emergency Use Authorization (EUA). This EUA will remain  in effect (meaning this test can be used) for the duration of the COVID-19 declaration under Section 564(b)(1) of the Act, 21 U.S.C.section 360bbb-3(b)(1), unless the authorization is terminated  or revoked sooner.       Influenza A by  PCR NEGATIVE NEGATIVE Final   Influenza B by PCR NEGATIVE NEGATIVE Final    Comment: (NOTE) The Xpert Xpress SARS-CoV-2/FLU/RSV plus assay is intended as an aid in the diagnosis of influenza from Nasopharyngeal swab specimens and should not be used as a sole basis for treatment. Nasal washings and aspirates are unacceptable for Xpert Xpress SARS-CoV-2/FLU/RSV testing.  Fact Sheet for Patients: EntrepreneurPulse.com.au  Fact Sheet for Healthcare Providers: IncredibleEmployment.be  This test is not yet approved or cleared by the [place] FDA and has been authorized for detection and/or diagnosis of SARS-CoV-2 by FDA under an Emergency Use Authorization (EUA). This  EUA will remain in effect (meaning this test can be used) for the duration of the COVID-19 declaration under Section 564(b)(1) of the Act, 21 U.S.C. section 360bbb-3(b)(1), unless the authorization is terminated or revoked.  Performed at [facility], [place] [address], [place], [place] [address]   Urine culture     Status: Abnormal   Collection Time: 12/24/19  4:45 PM   Specimen: Urine, Catheterized  Result Value Ref Range Status   Specimen Description   Final    URINE, CATHETERIZED Performed at [place] [address], [place], [place] [address]    Special Requests   Final    NONE Performed at [facility], [place] [address] [place], [place] [address]    Culture MULTIPLE SPECIES PRESENT, SUGGEST RECOLLECTION (A)  Final   Report Status 12/26/2019 FINAL  Final  MRSA PCR Screening     Status: None   Collection Time: 12/24/19  5:11 PM   Specimen: Nasal Mucosa; Nasopharyngeal  Result Value Ref Range Status   MRSA by PCR NEGATIVE NEGATIVE Final    Comment:        The GeneXpert MRSA Assay (FDA approved for NASAL specimens only), is one component of a comprehensive MRSA colonization surveillance program. It is  not intended to diagnose MRSA infection nor to guide or monitor treatment for MRSA infections. Performed at [facility], [place] [address], [place], [place] [address]   Culture, blood (routine x 2)     Status: None (Preliminary result)   Collection Time: 12/24/19  5:30 PM   Specimen: BLOOD  Result Value Ref Range Status   Specimen Description   Final    BLOOD BLOOD RIGHT FOREARM Performed at [place] [address] [place], [place] [address]    Special Requests   Final    BOTTLES DRAWN AEROBIC ONLY Blood Culture adequate volume Performed at [place] [address], [place], [place] [address]    Culture   Final    NO GROWTH 2 DAYS Performed at [place] [address] [place], [place] [address]    Report Status PENDING  Incomplete  Culture, blood (routine x 2)     Status: None (Preliminary result)   Collection Time: 12/24/19  5:30 PM   Specimen: BLOOD  Result Value Ref Range Status   Specimen Description   Final    BLOOD RIGHT ANTECUBITAL Performed at [place] [address] [place], [place] [address]    Special Requests   Final    BOTTLES DRAWN AEROBIC ONLY Blood Culture adequate volume Performed at [place] [address], [place], [place] [address]    Culture   Final    NO GROWTH 2 DAYS Performed at [place] [address] [place], [place] [address]    Report Status PENDING  Incomplete    Radiology Reports CT ABDOMEN PELVIS WO CONTRAST  Result Date: 12/24/2019 CLINICAL DATA:  34 year old female with history of epigastric pain for the past 2 days. EXAM: CT ABDOMEN AND PELVIS WITHOUT CONTRAST TECHNIQUE: Multidetector CT imaging of the abdomen and pelvis was performed following the standard protocol without IV contrast. COMPARISON:  CT the abdomen and pelvis 01/13/2019. FINDINGS: Lower chest: Scattered areas of mild scarring are noted  in the lower lobes of the lungs bilaterally. Hepatobiliary: Diffuse low attenuation throughout the hepatic parenchyma, indicative of hepatic steatosis. Low-attenuation lesions anteriorly in the liver measuring  up to 1.2 cm in diameter in segment 4B adjacent to the falciform ligament, incompletely characterized on today's non-contrast CT examination, but statistically likely to represent cysts. Intermediate attenuation amorphous material lying dependently in the gallbladder, compatible with biliary sludge. Gallbladder is only mildly distended, without surrounding inflammatory changes to clearly indicate an acute cholecystitis. Pancreas: Inflammatory changes noted adjacent to the pancreas, most evident adjacent to the head and proximal body, concerning for acute pancreatitis. Spleen: Unremarkable. Adrenals/Urinary Tract: Unenhanced appearance of the kidneys and bilateral adrenal glands is normal. No hydroureteronephrosis. Urinary bladder is unremarkable in appearance. Stomach/Bowel: Unenhanced appearance of the stomach is normal. There is no pathologic dilatation of small bowel or colon. Vascular/Lymphatic: No atherosclerotic calcifications in the abdominal aorta or pelvic vasculature. No definite lymphadenopathy noted in the abdomen or pelvis on today's noncontrast CT examination. Reproductive: Unenhanced appearance of the uterus and ovaries is unremarkable. Other: No significant volume of ascites.  No pneumoperitoneum. Musculoskeletal: There are no aggressive appearing lytic or blastic lesions noted in the visualized portions of the skeleton. IMPRESSION: 1. Inflammatory changes surrounding the pancreas concerning for acute pancreatitis. Correlation with lipase levels is strongly recommended. 2. Severe hepatic steatosis. 3. Biliary sludge lying dependently in the gallbladder. No definite imaging findings to suggest an acute cholecystitis at this time. Electronically Signed   By: Vinnie Langton M.D.   On: 12/24/2019  15:00   DG Chest Port 1 View  Result Date: 12/24/2019 CLINICAL DATA:  34 year old female with history of upper abdominal pain and dyspnea for the past 2 days. EXAM: PORTABLE CHEST 1 VIEW COMPARISON:  Chest x-ray 01/17/2019. FINDINGS: Lung volumes are normal. No consolidative airspace disease. No pleural effusions. No pneumothorax. No pulmonary nodule or mass noted. Pulmonary vasculature and the cardiomediastinal silhouette are within normal limits. IMPRESSION: No radiographic evidence of acute cardiopulmonary disease. Electronically Signed   By: Vinnie Langton M.D.   On: 12/24/2019 14:54   US Abdomen Limited RUQ (LIVER/GB)  Result Date: 12/25/2019 CLINICAL DATA:  Acute pancreatitis EXAM: ULTRASOUND ABDOMEN LIMITED RIGHT UPPER QUADRANT COMPARISON:  12/24/2019 FINDINGS: Gallbladder: Gallbladder distension noted without gallstones or wall thickening. Wall thickness 2 mm. No Murphy's sign elicited during the exam. Common bile duct: Diameter: 4 mm Liver: Increased echogenicity compatible with hepatic steatosis. No large focal hepatic abnormality or intrahepatic biliary dilatation. No surrounding free fluid or ascites. Portal vein is patent on color Doppler imaging with normal direction of blood flow towards the liver. Other: Included views of the right kidney demonstrate no hydronephrosis. IMPRESSION: Negative for gallstones. No biliary dilatation or obstruction Hepatic steatosis Electronically Signed   By: Jerilynn Mages.  Shick M.D.   On: 12/25/2019 12:04     Time Spent in minutes  30     Desiree Hane M.D on 12/27/2019 at 3:29 PM  To page go to [email or web address] - password [facility]

## 2019-12-27 NOTE — Progress Notes (Addendum)
Progress Note  Chief Complaint:   pancreatitis      ASSESSMENT / PLAN:    # 33 yo female with history of Etoh abuse, severe etoh pancreatitis, severe hepatic steatosis  # Recurrent acute pancreatitis with AKI , metabolic acidosis, SIRS. Pancreatitis presumably secondary to Etoh. Biliary pancreatitis not suspected. She has gallbladder sludge but no biliary duct dilation and alk phos has been normal. T 98.4. Hct 28%, renal function normal. Tolerating soft diet --No changes to make from GI standpoint. . When diet advance it should be low fat for next few weeks. From GI standpoint we can stop IVF now. If I+0 correct she is 11 liters positive fluid balance since admission.  --Abstinence from Etoh going forward  # Encephalopathy, resolved.   # Elevated liver chemistries, pattern predominantly hepatocellular.  Abnormal liver chemistries likely combination of Etoh and shock. Number have continued to improve. AST 193,  ALT 7391, Tbili 1.6. 2.3. INR has remained normal.   # Acute thrombocytopenia. Etiology? Maybe component of bone marrow suppression from etoh.  She is getting SQ heparin but platelets were already low. Counts improving 88 >> 98 >> 134  # Severe electrolyte derangements ( hypocalemia, hypocalcemia with low ionized Ca+, hypokalemia, hypomagnesemia, hypophostatemia. Improving.    # Chronic anemia. Her baseline seems to be around 12.5.  It dropped into 9-10 range December 2020 during admission for pancreatitis when she was likely given aggressive IV fluids. Hgb declined with admission with IVF. She is macrocytic so suspect a nutritional component. Currently getting folic acid supplements       SUBJECTIVE:   Had loose stool after eating grits this am but no increased abdominal pain with PO intake. Had potatoes for lunch. Still requiring small amount of Dilaudid ( twice this am). No significant nausea / vomiting    OBJECTIVE:    Scheduled inpatient medications:    chlorhexidine  15 mL Mouth Rinse BID   Chlorhexidine Gluconate Cloth  6 each Topical Daily   folic acid  1 mg Oral Daily   heparin  5,000 Units Subcutaneous Q8H   insulin aspart  1-3 Units Subcutaneous Q4H   mouth rinse  15 mL Mouth Rinse q12n4p   multivitamin  1 tablet Oral Daily   pantoprazole  40 mg Oral QHS   pneumococcal 23 valent vaccine  0.5 mL Intramuscular Tomorrow-1000   thiamine  100 mg Oral Daily   Continuous inpatient infusions:   lactated ringers 100 mL/hr at 12/27/19 0836   PRN inpatient medications: HYDROmorphone (DILAUDID) injection, lip balm, LORazepam **OR** LORazepam, oxyCODONE  Vital signs in last 24 hours: Temp:  [98.4 F (36.9 C)-99.5 F (37.5 C)] 98.4 F (36.9 C) (12/14 0432) Pulse Rate:  [89-110] 90 (12/14 1200) Resp:  [18-22] 20 (12/14 0432) BP: (108-120)/(82-94) 118/90 (12/14 1200) SpO2:  [95 %-99 %] 95 % (12/14 0432) Weight:  [77.9 kg] 77.9 kg (12/14 0432) Last BM Date: 12/27/19  Intake/Output Summary (Last 24 hours) at 12/27/2019 1437 Last data filed at 12/27/2019 0800 Gross per 24 hour  Intake 1531.3 ml  Output --  Net 1531.3 ml     Physical Exam:   General: Alert, female in NAD  Heart:  Regular rate and rhythm. No lower extremity edema  Pulmonary: Normal respiratory effort  Abdomen: Soft, nondistended, mild LUQ tenderness. Normal bowel sounds.   Neurologic: Alert and oriented  Psych: Pleasant. Cooperative.   Filed Weights   12/25/19 0500 12/26/19 0500 12/27/19 0432  Weight: 78 kg 78.1 kg  77.9 kg    Intake/Output from previous day: 12/13 0701 - 12/14 0700 In: 1800.9 [P.O.:360; I.V.:626.7; IV Piggyback:814.2] Out: -  Intake/Output this shift: Total I/O In: 345 [I.V.:345] Out: -     Lab Results: Recent Labs    12/25/19 0235 12/26/19 0231 12/27/19 0412  WBC 8.7 11.7* 8.7  HGB 10.1* 10.6* 9.8*  HCT 28.5* 29.3* 28.5*  PLT 88* 98* 134*   BMET Recent Labs    12/26/19 1537 12/26/19 1758 12/27/19 0412   NA 125* 135 138  K >7.5* 3.5 3.5  CL 82* 92* 96*  CO2 _0 GLUCOSE 488* 136* 114*  BUN 6 <5* <5*  CREATININE 0.69 0.64 0.55  CALCIUM 7.2* 8.0* 8.1*   LFT Recent Labs    12/27/19 0412  PROT 6.5  ALBUMIN 3.3*  AST 193*  ALT 73*  ALKPHOS 90  BILITOT 1.6*   PT/INR Recent Labs    12/25/19 0235 12/26/19 0231  LABPROT 14.3 12.4  INR 1.2 1.0   Hepatitis Panel No results for input(s): HEPBSAG, HCVAB, HEPAIGM, HEPBIGM in the last 72 hours.  No results found.    Active Problems:   Pancreatitis, acute   Acute pancreatitis     LOS: 3 days   Tye Savoy ,NP 12/27/2019, 2:37 PM    Attending physician's note   I have taken an interval history, reviewed the chart and examined the patient. I agree with the Advanced Practitioner's note, impression and recommendations.    Pancreatitis symptoms are improving, tolerating soft diet EtOH abstinence Continue to monitor LFT  Continue supportive care  GI will sign off, please call with any questions   I have spent 25 minutes of patient care (this includes precharting, chart review, review of results, face-to-face time used for counseling as well as treatment plan and follow-up. The patient was provided an opportunity to ask questions and all were answered. The patient agreed with the plan and demonstrated an understanding of the instructions.  Damaris Hippo , MD [phone]

## 2019-12-28 LAB — CBC
HCT: 29.9 % — ABNORMAL LOW (ref 36.0–46.0)
Hemoglobin: 10.3 g/dL — ABNORMAL LOW (ref 12.0–15.0)
MCH: 35.2 pg — ABNORMAL HIGH (ref 26.0–34.0)
MCHC: 34.4 g/dL (ref 30.0–36.0)
MCV: 102 fL — ABNORMAL HIGH (ref 80.0–100.0)
Platelets: 167 10*3/uL (ref 150–400)
RBC: 2.93 MIL/uL — ABNORMAL LOW (ref 3.87–5.11)
RDW: 13.7 % (ref 11.5–15.5)
WBC: 5.8 10*3/uL (ref 4.0–10.5)
nRBC: 0.3 % — ABNORMAL HIGH (ref 0.0–0.2)

## 2019-12-28 LAB — COMPREHENSIVE METABOLIC PANEL
ALT: 70 U/L — ABNORMAL HIGH (ref 0–44)
AST: 174 U/L — ABNORMAL HIGH (ref 15–41)
Albumin: 3.6 g/dL (ref 3.5–5.0)
Alkaline Phosphatase: 92 U/L (ref 38–126)
Anion gap: 11 (ref 5–15)
BUN: <5 mg/dL — ABNORMAL LOW (ref 6–20)
CO2: 27 mmol/L (ref 22–32)
Calcium: 8.3 mg/dL — ABNORMAL LOW (ref 8.9–10.3)
Chloride: 98 mmol/L (ref 98–111)
Creatinine, Ser: 0.55 mg/dL (ref 0.44–1.00)
GFR, Estimated: >60 mL/min (ref >60)
Glucose, Bld: 122 mg/dL — ABNORMAL HIGH (ref 70–99)
Potassium: 3.5 mmol/L (ref 3.5–5.1)
Sodium: 136 mmol/L (ref 135–145)
Total Bilirubin: 1 mg/dL (ref 0.3–1.2)
Total Protein: 7.1 g/dL (ref 6.5–8.1)

## 2019-12-28 LAB — GLUCOSE, CAPILLARY
Glucose-Capillary: 118 mg/dL — ABNORMAL HIGH (ref 70–99)
Glucose-Capillary: 108 mg/dL — ABNORMAL HIGH (ref 70–99)
Glucose-Capillary: 186 mg/dL — ABNORMAL HIGH (ref 70–99)

## 2019-12-28 LAB — MAGNESIUM: Magnesium: 1.7 mg/dL (ref 1.7–2.4)

## 2019-12-28 LAB — PHOSPHORUS: Phosphorus: 2 mg/dL — ABNORMAL LOW (ref 2.5–4.6)

## 2019-12-28 MED ORDER — OXYCODONE HCL 5 MG PO TABS: 5.0000 mg | ORAL_TABLET | Freq: Four times a day (QID) | ORAL | 0 refills | Status: DC | PRN

## 2019-12-28 MED ORDER — POTASSIUM PHOSPHATE MONOBASIC 500 MG PO TABS: 500.0000 mg | ORAL_TABLET | Freq: Two times a day (BID) | ORAL | 0 refills | Status: AC

## 2019-12-28 NOTE — Progress Notes (Signed)
Pt to be discharged to home this afternoon. Pt given discharge instructions including all discharge Medications and schedules for these Medications. Pt verbalized understanding of all discharge instructions/teaching. Discharge packet with Pt at time of discharge

## 2019-12-28 NOTE — Discharge Summary (Signed)
Physician Discharge Summary  Yolanda Flores YIA:[id number] DOB: [birth year] DOA: 12/24/2019  PCP: Ladell Pier, MD  Admit date: 12/24/2019 Discharge date: 12/28/2019  Admitted From: Home  Discharge disposition: Home   Recommendations for Outpatient Follow-Up:   . Follow up with your primary care provider in one week.  . Check CBC, BMP, magnesium in the next visit . Continue low-fat diet for few days.   Discharge Diagnosis:   Principal Problem:   Acute pancreatitis Active Problems:   Hypokalemia   Alcohol use disorder, mild, in early remission   Abnormal LFTs   Acute renal failure (HCC)   Pancreatitis, acute   Macrocytic anemia   Abdominal pain  Discharge Condition: Improved.  Diet recommendation: Low sodium, heart healthy.  Low-fat diet  Wound care: None.  Code status: Full.   History of Present Illness:   Ms. Yolanda Flores is a 34 year old female with medical history significant for alcohol abuse, history of pancreatitis secondary to alcohol abuse, and HTN with gallstones  presented to the hospital on 12/24/2019 with 3 days of abdominal pain, vomiting, confusion reported with increased binge drinking.  She was initially admitted to the critical care service for pancreatitis likely due to alcohol abuse (confirmed on CT abdomen, initial lipase 7078) complicated by several electrolyte derangements, AKI and acute liver injury.  Received supportive care in stepdown unit with IV fluids, correction of electrolyte abnormalities, and was transferred to [facility] service on 12/27/2019.   Hospital Course:   Following conditions were addressed during hospitalization as listed below,  Acute pancreatitis likely alcohol related.  No local complications on CT abdomen, right upper quadrant ultrasound ruled out gallstones, LFTs down trended.  Patient was advanced on diet which she tolerated.  Patient was initially on IV narcotics for pain.  Has been switched to p.o. on discharge.     Acute liver injury, elevated LFTs and hyperbilirubinemia on presentation suspect related to alcohol use and hypotension on admission (86/57).  "Ultrasound showed no gallstones, no evidence of cirrhosis, does have hepatic steatosis.    Hepatitis panel was negative.  LFTs trended down.  Acute encephalopathy, resolved.  Likely multifactorial from electrolyte imbalance, acute kidney injury, IV narcotics.   Ammonia was elevated in the 100s ultrasound was negative for cirrhosis but did show hepatic steatosis.  Improved encephalopathy at this time.  Unlikely hepatic encephalopathy.  Severe electrolytes derangements (hypercalcemia, hypokalemia, hypomagnesemia, hypophosphatemia admission), now resolved with supplementation and IV fluids  Chronic macrocytic anemia, stable at baseline.  Suspect likely related to nutritional deficiencies (folate versus B12) baseline hemoglobin 9-10.  Hemoglobin prior to discharge was 10.3.  Will need outpatient follow-up.  Alcohol use disorder.   Patient did not have any signs of withdrawal.  Was on CIWA protocol.  Received Multivitamin, folic acid, thiamine  Thrombocytopenia.  Likely due to hepatic steatosis.  HIV negative Improved.  Platelet prior to discharge was 167.  Essential hypertension. -Resumed home medications on discharge   GERD, stable -continue home PPI  Disposition.  At this time, patient is stable for disposition home.  Follow-up with primary care physician as outpatient.  Medical Consultants:    PCCM  GI  Procedures:    None Subjective:   Today, patient was seen and examined at bedside.  Has tolerated oral diet.  Wishes to go home.  Mild abdominal discomfort.  Denies any nausea, vomiting, fever, shortness of breath  Discharge Exam:   Vitals:   12/28/19 0449 12/28/19 0554  BP: (!) 129/105 (!) 124/102  Pulse: (!) 102  97  Resp: 18   Temp: 99.4 F (37.4 C)   SpO2: 98%    Vitals:   12/27/19 1600 12/27/19 2018 12/28/19 0449  12/28/19 0554  BP: 120/90 (!) 121/95 (!) 129/105 (!) 124/102  Pulse: 95 96 (!) 102 97  Resp:  16 18   Temp:  99.3 F (37.4 C) 99.4 F (37.4 C)   TempSrc:  Oral Oral   SpO2:  99% 98%   Weight:   76.1 kg   Height:       Body mass index is 27.07 kg/m.  General: Alert awake, not in obvious distress HENT: pupils equally reacting to light,  No scleral pallor or icterus noted. Oral mucosa is moist.  Chest:  Clear breath sounds.  Diminished breath sounds bilaterally. No crackles or wheezes.  CVS: S1 &S2 heard. No murmur.  Regular rate and rhythm. Abdomen: Soft, minimal tenderness over the epigastric region on deep palpation, nondistended.  Bowel sounds are heard.  No guarding rigidity or rebound tenderness Extremities: No cyanosis, clubbing or edema.  Peripheral pulses are palpable. Psych: Alert, awake and oriented, normal mood CNS:  No cranial nerve deficits.  Power equal in all extremities.   Skin: Warm and dry.  No rashes noted.  The results of significant diagnostics from this hospitalization (including imaging, microbiology, ancillary and laboratory) are listed below for reference.     Diagnostic Studies:   CT ABDOMEN PELVIS WO CONTRAST  Result Date: 12/24/2019 CLINICAL DATA:  34 year old female with history of epigastric pain for the past 2 days. EXAM: CT ABDOMEN AND PELVIS WITHOUT CONTRAST TECHNIQUE: Multidetector CT imaging of the abdomen and pelvis was performed following the standard protocol without IV contrast. COMPARISON:  CT the abdomen and pelvis 01/13/2019. FINDINGS: Lower chest: Scattered areas of mild scarring are noted in the lower lobes of the lungs bilaterally. Hepatobiliary: Diffuse low attenuation throughout the hepatic parenchyma, indicative of hepatic steatosis. Low-attenuation lesions anteriorly in the liver measuring up to 1.2 cm in diameter in segment 4B adjacent to the falciform ligament, incompletely characterized on today's non-contrast CT examination, but  statistically likely to represent cysts. Intermediate attenuation amorphous material lying dependently in the gallbladder, compatible with biliary sludge. Gallbladder is only mildly distended, without surrounding inflammatory changes to clearly indicate an acute cholecystitis. Pancreas: Inflammatory changes noted adjacent to the pancreas, most evident adjacent to the head and proximal body, concerning for acute pancreatitis. Spleen: Unremarkable. Adrenals/Urinary Tract: Unenhanced appearance of the kidneys and bilateral adrenal glands is normal. No hydroureteronephrosis. Urinary bladder is unremarkable in appearance. Stomach/Bowel: Unenhanced appearance of the stomach is normal. There is no pathologic dilatation of small bowel or colon. Vascular/Lymphatic: No atherosclerotic calcifications in the abdominal aorta or pelvic vasculature. No definite lymphadenopathy noted in the abdomen or pelvis on today's noncontrast CT examination. Reproductive: Unenhanced appearance of the uterus and ovaries is unremarkable. Other: No significant volume of ascites.  No pneumoperitoneum. Musculoskeletal: There are no aggressive appearing lytic or blastic lesions noted in the visualized portions of the skeleton. IMPRESSION: 1. Inflammatory changes surrounding the pancreas concerning for acute pancreatitis. Correlation with lipase levels is strongly recommended. 2. Severe hepatic steatosis. 3. Biliary sludge lying dependently in the gallbladder. No definite imaging findings to suggest an acute cholecystitis at this time. Electronically Signed   By: Vinnie Langton M.D.   On: 12/24/2019 15:00   DG Chest Port 1 View  Result Date: 12/24/2019 CLINICAL DATA:  34 year old female with history of upper abdominal pain and dyspnea for the past 2 days.  EXAM: PORTABLE CHEST 1 VIEW COMPARISON:  Chest x-ray 01/17/2019. FINDINGS: Lung volumes are normal. No consolidative airspace disease. No pleural effusions. No pneumothorax. No pulmonary  nodule or mass noted. Pulmonary vasculature and the cardiomediastinal silhouette are within normal limits. IMPRESSION: No radiographic evidence of acute cardiopulmonary disease. Electronically Signed   By: Vinnie Langton M.D.   On: 12/24/2019 14:54   US Abdomen Limited RUQ (LIVER/GB)  Result Date: 12/25/2019 CLINICAL DATA:  Acute pancreatitis EXAM: ULTRASOUND ABDOMEN LIMITED RIGHT UPPER QUADRANT COMPARISON:  12/24/2019 FINDINGS: Gallbladder: Gallbladder distension noted without gallstones or wall thickening. Wall thickness 2 mm. No Murphy's sign elicited during the exam. Common bile duct: Diameter: 4 mm Liver: Increased echogenicity compatible with hepatic steatosis. No large focal hepatic abnormality or intrahepatic biliary dilatation. No surrounding free fluid or ascites. Portal vein is patent on color Doppler imaging with normal direction of blood flow towards the liver. Other: Included views of the right kidney demonstrate no hydronephrosis. IMPRESSION: Negative for gallstones. No biliary dilatation or obstruction Hepatic steatosis Electronically Signed   By: Jerilynn Mages.  Shick M.D.   On: 12/25/2019 12:04     Labs:   Basic Metabolic Panel: Recent Labs  Lab 12/25/19 1112 12/25/19 1938 12/26/19 0231 12/26/19 1537 12/26/19 1758 12/27/19 0412 12/28/19 0439  NA 133* 134* 133* 125* 135 138 136  K 3.3* 3.4* 2.9* >7.5* 3.5 3.5 3.5  CL 90* 90* 88* 82* 92* 96* 98  CO2 21* 25 25 27 31 29 27   GLUCOSE 176* 139* 121* 488* 136* 114* 122*  BUN 13 12 9 6  <5* <5* <5*  CREATININE 0.96 0.91 0.73 0.69 0.64 0.55 0.55  CALCIUM 8.0* 7.7* 7.8* 7.2* 8.0* 8.1* 8.3*  MG 2.7* 2.5* 2.4  --   --  1.7 1.7  PHOS  --  1.4* 0.8* 19.7*  --  2.1* 2.0*   GFR Estimated Creatinine Clearance: 106.2 mL/min (by C-G formula based on SCr of 0.55 mg/dL). Liver Function Tests: Recent Labs  Lab 12/24/19 1214 12/25/19 0235 12/26/19 0231 12/27/19 0412 12/28/19 0439  AST 1,634* 855* 279* 193* 174*  ALT 221* 135* 91* 73* 70*   ALKPHOS 112 61 72 90 92  BILITOT 3.9* 2.8* 2.3* 1.6* 1.0  PROT 9.1* 6.8 6.8 6.5 7.1  ALBUMIN 4.9 3.4* 3.3* 3.3* 3.6   Recent Labs  Lab 12/24/19 1214 12/24/19 1333 12/25/19 0235  LIPASE 1,184* 1,074* 104*   Recent Labs  Lab 12/24/19 1730 12/26/19 0852  AMMONIA 107* 42*   Coagulation profile Recent Labs  Lab 12/24/19 1730 12/25/19 0235 12/26/19 0231  INR 1.1 1.2 1.0    CBC: Recent Labs  Lab 12/24/19 1214 12/25/19 0235 12/26/19 0231 12/27/19 0412 12/28/19 0439  WBC 6.4 8.7 11.7* 8.7 5.8  HGB 12.5 10.1* 10.6* 9.8* 10.3*  HCT 38.4 28.5* 29.3* 28.5* 29.9*  MCV 105.8* 99.0 98.0 102.2* 102.0*  PLT 117* 88* 98* 134* 167   Cardiac Enzymes: No results for input(s): CKTOTAL, CKMB, CKMBINDEX, TROPONINI in the last 168 hours. BNP: Invalid input(s): POCBNP CBG: Recent Labs  Lab 12/27/19 2019 12/27/19 2355 12/28/19 0445 12/28/19 0803 12/28/19 1134  GLUCAP 168* 101* 118* 108* 186*   D-Dimer No results for input(s): DDIMER in the last 72 hours. Hgb A1c No results for input(s): HGBA1C in the last 72 hours. Lipid Profile No results for input(s): CHOL, HDL, LDLCALC, TRIG, CHOLHDL, LDLDIRECT in the last 72 hours. Thyroid function studies No results for input(s): TSH, T4TOTAL, T3FREE, THYROIDAB in the last 72 hours.  Invalid input(s):  FREET3 Anemia work up Recent Labs    12/27/19 1559  VITAMINB12 632  FOLATE 14.0   Microbiology Recent Results (from the past 240 hour(s))  Resp Panel by RT-PCR (Flu A&B, Covid) Nasopharyngeal Swab     Status: None   Collection Time: 12/24/19  1:32 PM   Specimen: Nasopharyngeal Swab; Nasopharyngeal(NP) swabs in vial transport medium  Result Value Ref Range Status   SARS Coronavirus 2 by RT PCR NEGATIVE NEGATIVE Final    Comment: (NOTE) SARS-CoV-2 target nucleic acids are NOT DETECTED.  The SARS-CoV-2 RNA is generally detectable in upper respiratory specimens during the acute phase of infection. The lowest concentration of  SARS-CoV-2 viral copies this assay can detect is 138 copies/mL. A negative result does not preclude SARS-Cov-2 infection and should not be used as the sole basis for treatment or other patient management decisions. A negative result may occur with  improper specimen collection/handling, submission of specimen other than nasopharyngeal swab, presence of viral mutation(s) within the areas targeted by this assay, and inadequate number of viral copies(<138 copies/mL). A negative result must be combined with clinical observations, patient history, and epidemiological information. The expected result is Negative.  Fact Sheet for Patients:  EntrepreneurPulse.com.au  Fact Sheet for Healthcare Providers:  IncredibleEmployment.be  This test is no t yet approved or cleared by the [place] FDA and  has been authorized for detection and/or diagnosis of SARS-CoV-2 by FDA under an Emergency Use Authorization (EUA). This EUA will remain  in effect (meaning this test can be used) for the duration of the COVID-19 declaration under Section 564(b)(1) of the Act, 21 U.S.C.section 360bbb-3(b)(1), unless the authorization is terminated  or revoked sooner.       Influenza A by PCR NEGATIVE NEGATIVE Final   Influenza B by PCR NEGATIVE NEGATIVE Final    Comment: (NOTE) The Xpert Xpress SARS-CoV-2/FLU/RSV plus assay is intended as an aid in the diagnosis of influenza from Nasopharyngeal swab specimens and should not be used as a sole basis for treatment. Nasal washings and aspirates are unacceptable for Xpert Xpress SARS-CoV-2/FLU/RSV testing.  Fact Sheet for Patients: EntrepreneurPulse.com.au  Fact Sheet for Healthcare Providers: IncredibleEmployment.be  This test is not yet approved or cleared by the [place] FDA and has been authorized for detection and/or diagnosis of SARS-CoV-2 by FDA under an Emergency Use  Authorization (EUA). This EUA will remain in effect (meaning this test can be used) for the duration of the COVID-19 declaration under Section 564(b)(1) of the Act, 21 U.S.C. section 360bbb-3(b)(1), unless the authorization is terminated or revoked.  Performed at [facility], [place] [address], [place], [place] [address]   Urine culture     Status: Abnormal   Collection Time: 12/24/19  4:45 PM   Specimen: Urine, Catheterized  Result Value Ref Range Status   Specimen Description   Final    URINE, CATHETERIZED Performed at [facility] [address] [place], [place] [address]    Special Requests   Final    NONE Performed at [facility], [place] [address], [place], [place] [address]    Culture MULTIPLE SPECIES PRESENT, SUGGEST RECOLLECTION (A)  Final   Report Status 12/26/2019 FINAL  Final  MRSA PCR Screening     Status: None   Collection Time: 12/24/19  5:11 PM   Specimen: Nasal Mucosa; Nasopharyngeal  Result Value Ref Range Status   MRSA by PCR NEGATIVE NEGATIVE Final    Comment:  The GeneXpert MRSA Assay (FDA approved for NASAL specimens only), is one component of a comprehensive MRSA colonization surveillance program. It is not intended to diagnose MRSA infection nor to guide or monitor treatment for MRSA infections. Performed at [facility], [place] [address], [place], [place] [address]   Culture, blood (routine x 2)     Status: None (Preliminary result)   Collection Time: 12/24/19  5:30 PM   Specimen: BLOOD  Result Value Ref Range Status   Specimen Description   Final    BLOOD BLOOD RIGHT FOREARM Performed at [facility] [address], [place], [place] [address]    Special Requests   Final    BOTTLES DRAWN AEROBIC ONLY Blood Culture adequate volume Performed at [facility] [address] [place], [place] [address]    Culture   Final    NO  GROWTH 4 DAYS Performed at [facility], [place] [address] [place], [place] [address]    Report Status PENDING  Incomplete  Culture, blood (routine x 2)     Status: None (Preliminary result)   Collection Time: 12/24/19  5:30 PM   Specimen: BLOOD  Result Value Ref Range Status   Specimen Description   Final    BLOOD RIGHT ANTECUBITAL Performed at [place] [address], [place], [place] [address]    Special Requests   Final    BOTTLES DRAWN AEROBIC ONLY Blood Culture adequate volume Performed at [place] [address], [place], [place] [address]    Culture   Final    NO GROWTH 4 DAYS Performed at [facility], [place] [address] [place],  [address]    Report Status PENDING  Incomplete     Discharge Instructions:   Discharge Instructions    Call MD for:  persistant nausea and vomiting   Complete by: As directed    Call MD for:  severe uncontrolled pain   Complete by: As directed    Call MD for:  temperature >100.4   Complete by: As directed    Diet - low sodium heart healthy   Complete by: As directed    Low-fat diet   Discharge instructions   Complete by: As directed    Stay on a low-fat diet for few days.No alcohol. Follow-up with your primary care physician in 1 week.   Increase activity slowly   Complete by: As directed      Allergies as of 12/28/2019      Reactions   Ibuprofen       Medication List    STOP taking these medications   sucralfate 1 GM/10ML suspension Commonly known as: CARAFATE   thiamine 100 MG tablet     TAKE these medications   amLODipine 10 MG tablet Commonly known as: NORVASC Take 1 tablet (10 mg total) by mouth daily.   ferrous sulfate 325 (65 FE) MG tablet Take 1 tablet (325 mg total) by mouth 2 (two) times daily with a meal.   folic acid 1 MG tablet Commonly known as: FOLVITE Take 1 tablet (1 mg total) by mouth daily.   hydrALAZINE 10 MG tablet Commonly known  as: APRESOLINE Take 2 tablets (20 mg total) by mouth every 8 (eight) hours.   lisinopril 20 MG tablet Commonly known as: ZESTRIL Take 20 mg by mouth daily.   multivitamin with minerals Tabs tablet Take 1 tablet by mouth daily.   oxyCODONE 5 MG immediate  release tablet Commonly known as: Oxy IR/ROXICODONE Take 1 tablet (5 mg total) by mouth every 6 (six) hours as needed for moderate pain or severe pain.   pantoprazole 40 MG tablet Commonly known as: PROTONIX TAKE 1 TABLET(40 MG) BY MOUTH DAILY What changed: See the new instructions.   polyethylene glycol 17 g packet Commonly known as: MIRALAX / GLYCOLAX Take 17 g by mouth daily as needed for mild constipation.   potassium phosphate (monobasic) 500 MG tablet Commonly known as: K-PHOS ORIGINAL Take 1 tablet (500 mg total) by mouth 2 (two) times daily with a meal for 5 days.       Follow-up Information    Ladell Pier, MD. Schedule an appointment as soon as possible for a visit in 1 week(s).   Specialty: Internal Medicine Why: For regular checkup Contact information: [place] [address] [phone]                Time coordinating discharge: 39 minutes  Signed:  Vearl Aitken  [facility] 12/28/2019, 12:04 PM

## 2019-12-29 ENCOUNTER — Telehealth: Payer: Self-pay

## 2019-12-29 NOTE — Telephone Encounter (Signed)
Transition Care Management Follow-up Telephone Call  Date of discharge and from where: 12/28/2019, [facility]   How have you been since you were released from the hospital? She said she is feeling better than a week ago, trying to get her strength back  Any questions or concerns? Yes - she said that she left her discharge packet of information in the [place] that she took home yesterday. She has contacted the floor she was on in the hospital and they found the information for her.  She explained that she really needs the work note that is in the packet because she was off from work all week and needs to return to work Monday    Items Reviewed:  Did the pt receive and understand the discharge instructions provided? she will review the packet when she picks it up and will call this office if she has any questions.   Medications obtained and verified? she said she will check the list when she picks up the discharge information from the hospital.  She thinks she has all medications except the new meds that she needs to get at her pharmacy.  She is requesting a refill of protonix, however has not been seen at [facility] for almost one year.   Other? No   Any new allergies since your discharge? No   Do you have support at home? Yes , her boyfriend  Home Care and Equipment/Supplies: Were home health services ordered? no If so, what is the name of the agency? n/a Has the agency set up a time to come to the patient's home? n/a Were any new equipment or medical supplies ordered?  No What is the name of the medical supply agency? n/a Were you able to get the supplies/equipment? n/a Do you have any questions related to the use of the equipment or supplies? No, n/a  Functional Questionnaire: (I = Independent and D = Dependent) ADLs: independent  Follow up appointments reviewed:   PCP Hospital f/u appt confirmed? Yes  Freeman Caldron, PA 01/18/2020.  She requested an afternoon  appointment  [facility] f/u appt confirmed? No , none scheduled at this time   Are transportation arrangements needed? No   If their condition worsens, is the pt aware to call PCP or go to the Emergency Dept.? yes  Was the patient provided with contact information for the PCP's office or ED? She has the phone number for the clinic  Was to pt encouraged to call back with questions or concerns? yes

## 2019-12-30 LAB — CULTURE, BLOOD (ROUTINE X 2)
Culture: NO GROWTH
Culture: NO GROWTH
Special Requests: ADEQUATE
Special Requests: ADEQUATE

## 2020-01-05 ENCOUNTER — Ambulatory Visit: Payer: BLUE CROSS/BLUE SHIELD | Admitting: Internal Medicine

## 2020-01-12 ENCOUNTER — Other Ambulatory Visit: Payer: Self-pay | Admitting: Internal Medicine

## 2020-01-12 ENCOUNTER — Other Ambulatory Visit: Payer: Self-pay | Admitting: Physician Assistant

## 2020-01-12 DIAGNOSIS — I10 Essential (primary) hypertension: Secondary | ICD-10-CM

## 2020-01-12 MED ORDER — AMLODIPINE BESYLATE 10 MG PO TABS: 10.0000 mg | ORAL_TABLET | Freq: Every day | ORAL | 0 refills | Status: DC

## 2020-01-12 NOTE — Telephone Encounter (Signed)
Medication:  amLODipine (NORVASC) 10 MG tablet [[id number]]   Has the patient contacted their pharmacy? Yes   (Agent: If yes, when and what did the pharmacy advise?) to call the office   Preferred Pharmacy (with phone number or street name):  [id number] [place], [place] - [address] AT [facility] [address] [place] [address]  Phone:  [phone] Fax:  [phone]  Agent: Please be advised that RX refills may take up to 3 business days. We ask that you follow-up with your pharmacy.

## 2020-01-12 NOTE — Telephone Encounter (Signed)
Courtesy refill provided. Patient has appt scheduled on 01/18/20.

## 2020-01-17 NOTE — Progress Notes (Deleted)
Patient ID: Yolanda Flores, female   DOB: Sep 19, 1985, 35 y.o.   MRN: [id number]  After hospitalization 12/11-12/15/2021 for pancreatitis  From discharge summary: Principal Problem:   Acute pancreatitis Active Problems:   Hypokalemia   Alcohol use disorder, mild, in early remission   Abnormal LFTs   Acute renal failure (HCC)   Pancreatitis, acute   Macrocytic anemia   Abdominal pain  HPI: [name] is a 35 year old female with medical history significant for alcohol abuse, history of pancreatitis secondary to alcohol abuse, and HTN with gallstones  presented to the hospital on 12/24/2019 with 3 days of abdominal pain, vomiting, confusion reported with increased binge drinking. She was initially admitted to the critical care service for pancreatitis likely due to alcohol abuse (confirmed on CT abdomen, initial lipase 1104) complicated by several electrolyte derangements, AKI and acute liver injury.Received supportive care in stepdown unit with IV fluids, correction of electrolyte abnormalities,andwas transferred to [facility] service on 12/27/2019.  Acute pancreatitis likely alcohol related. No local complications on CT abdomen, right upper quadrant ultrasound ruled out gallstones, LFTs down trended.  Patient was advanced on diet which she tolerated.  Patient was initially on IV narcotics for pain.  Has been switched to p.o. on discharge.    Acute liver injury, elevated LFTs and hyperbilirubinemia on presentation suspect related to alcohol use and hypotension on admission (86/57). "Ultrasound showed no gallstones, no evidence of cirrhosis, does have hepatic steatosis.   Hepatitis panel was negative.  LFTs trended down.  Acute encephalopathy, resolved. Likely multifactorial from electrolyte imbalance, acute kidney injury, IV narcotics.  Ammonia was elevated in the 100s ultrasound was negative for cirrhosis but did show hepatic steatosis.  Improved encephalopathy at this time.  Unlikely  hepatic encephalopathy.  Severe electrolytes derangements (hypercalcemia, hypokalemia, hypomagnesemia, hypophosphatemia admission), now resolved with supplementation and IV fluids  Chronic macrocytic anemia, stable at baseline. Suspect likely related to nutritional deficiencies (folate versus B12) baseline hemoglobin 9-10.  Hemoglobin prior to discharge was 10.3.  Will need outpatient follow-up.  Alcohol use disorder.  Patient did not have any signs of withdrawal.  Was on CIWA protocol.  Received Multivitamin, folic acid, thiamine  Thrombocytopenia. Likely due to hepatic steatosis. HIV negative Improved.  Platelet prior to discharge was 167.  Essential hypertension. -Resumed home medications on discharge   GERD, stable -continue home PPI  Disposition.  At this time, patient is stable for disposition home.  Follow-up with primary care physician as outpatient.

## 2020-01-18 ENCOUNTER — Ambulatory Visit: Payer: BLUE CROSS/BLUE SHIELD | Admitting: Physician Assistant

## 2020-01-25 ENCOUNTER — Other Ambulatory Visit: Payer: Self-pay

## 2020-02-08 NOTE — Progress Notes (Deleted)
Patient ID: Yolanda Flores, female   DOB: 1985-03-02, 35 y.o.   MRN: [id number]   After hospitalization 12/11-12/15/2021  From discharge summary: Principal Problem:   Acute pancreatitis Active Problems:   Hypokalemia   Alcohol use disorder, mild, in early remission   Abnormal LFTs   Acute renal failure (HCC)   Pancreatitis, acute   Macrocytic anemia   Abdominal pain  Ms. [name] is a 35 year old female with medical history significant for alcohol abuse, history of pancreatitis secondary to alcohol abuse, and HTN with gallstones  presented to the hospital on 12/24/2019 with 3 days of abdominal pain, vomiting, confusion reported with increased binge drinking. She was initially admitted to the critical care service for pancreatitis likely due to alcohol abuse (confirmed on CT abdomen, initial lipase 5397) complicated by several electrolyte derangements, AKI and acute liver injury.Received supportive care in stepdown unit with IV fluids, correction of electrolyte abnormalities,andwas transferred to [facility] service on 12/27/2019.  Following conditions were addressed during hospitalization as listed below,  Acute pancreatitis likely alcohol related. No local complications on CT abdomen, right upper quadrant ultrasound ruled out gallstones, LFTs down trended.  Patient was advanced on diet which she tolerated.  Patient was initially on IV narcotics for pain.  Has been switched to p.o. on discharge.    Acute liver injury, elevated LFTs and hyperbilirubinemia on presentation suspect related to alcohol use and hypotension on admission (86/57). "Ultrasound showed no gallstones, no evidence of cirrhosis, does have hepatic steatosis.   Hepatitis panel was negative.  LFTs trended down.  Acute encephalopathy, resolved. Likely multifactorial from electrolyte imbalance, acute kidney injury, IV narcotics.  Ammonia was elevated in the 100s ultrasound was negative for cirrhosis but did show hepatic  steatosis.  Improved encephalopathy at this time.  Unlikely hepatic encephalopathy.  Severe electrolytes derangements (hypercalcemia, hypokalemia, hypomagnesemia, hypophosphatemia admission), now resolved with supplementation and IV fluids  Chronic macrocytic anemia, stable at baseline. Suspect likely related to nutritional deficiencies (folate versus B12) baseline hemoglobin 9-10.  Hemoglobin prior to discharge was 10.3.  Will need outpatient follow-up.  Alcohol use disorder.  Patient did not have any signs of withdrawal.  Was on CIWA protocol.  Received Multivitamin, folic acid, thiamine  Thrombocytopenia. Likely due to hepatic steatosis. HIV negative Improved.  Platelet prior to discharge was 167.  Essential hypertension. -Resumed home medications on discharge   GERD, stable -continue home PPI  Disposition.  At this time, patient is stable for disposition home.  Follow-up with primary care physician as outpatient.

## 2020-02-09 ENCOUNTER — Ambulatory Visit: Payer: BLUE CROSS/BLUE SHIELD | Admitting: Physician Assistant

## 2020-02-13 ENCOUNTER — Other Ambulatory Visit: Payer: Self-pay | Admitting: Internal Medicine

## 2020-02-13 DIAGNOSIS — I10 Essential (primary) hypertension: Secondary | ICD-10-CM

## 2020-03-14 ENCOUNTER — Other Ambulatory Visit: Payer: Self-pay | Admitting: Internal Medicine

## 2020-03-14 DIAGNOSIS — I10 Essential (primary) hypertension: Secondary | ICD-10-CM

## 2020-03-20 ENCOUNTER — Other Ambulatory Visit: Payer: Self-pay

## 2020-03-20 ENCOUNTER — Other Ambulatory Visit: Payer: Self-pay | Admitting: Internal Medicine

## 2020-03-20 DIAGNOSIS — I10 Essential (primary) hypertension: Secondary | ICD-10-CM

## 2020-04-15 ENCOUNTER — Other Ambulatory Visit: Payer: Self-pay | Admitting: Internal Medicine

## 2020-04-15 DIAGNOSIS — I10 Essential (primary) hypertension: Secondary | ICD-10-CM

## 2020-04-15 NOTE — Telephone Encounter (Signed)
Pt last OV was 1 year ago. Pt was given 30 day courtesy refill on 01/12/20. MyChart message sent to pt to make appt and that medication refill was refused.  Routing to office.

## 2020-07-10 ENCOUNTER — Ambulatory Visit: Payer: BLUE CROSS/BLUE SHIELD | Admitting: Internal Medicine

## 2020-12-09 ENCOUNTER — Encounter (HOSPITAL_BASED_OUTPATIENT_CLINIC_OR_DEPARTMENT_OTHER): Payer: Self-pay | Admitting: Emergency Medicine

## 2020-12-09 ENCOUNTER — Emergency Department (HOSPITAL_BASED_OUTPATIENT_CLINIC_OR_DEPARTMENT_OTHER)
Admission: EM | Admit: 2020-12-09 | Discharge: 2020-12-09 | Disposition: A | Discharge status: Home / Self Care | Payer: BLUE CROSS/BLUE SHIELD | Attending: Emergency Medicine | Admitting: Emergency Medicine

## 2020-12-09 ENCOUNTER — Other Ambulatory Visit: Payer: Self-pay

## 2020-12-09 DIAGNOSIS — I10 Essential (primary) hypertension: Secondary | ICD-10-CM | POA: Insufficient documentation

## 2020-12-09 DIAGNOSIS — R739 Hyperglycemia, unspecified: Secondary | ICD-10-CM | POA: Diagnosis not present

## 2020-12-09 DIAGNOSIS — Z79899 Other long term (current) drug therapy: Secondary | ICD-10-CM | POA: Diagnosis not present

## 2020-12-09 DIAGNOSIS — Z87891 Personal history of nicotine dependence: Secondary | ICD-10-CM | POA: Insufficient documentation

## 2020-12-09 DIAGNOSIS — Z7984 Long term (current) use of oral hypoglycemic drugs: Secondary | ICD-10-CM | POA: Insufficient documentation

## 2020-12-09 LAB — URINALYSIS, ROUTINE W REFLEX MICROSCOPIC
Bilirubin Urine: NEGATIVE
Glucose, UA: NEGATIVE mg/dL
Ketones, ur: NEGATIVE mg/dL
Leukocytes,Ua: NEGATIVE
Nitrite: NEGATIVE
Protein, ur: NEGATIVE mg/dL
Specific Gravity, Urine: 1.009 (ref 1.005–1.030)
pH: 6 (ref 5.0–8.0)

## 2020-12-09 LAB — CBC WITH DIFFERENTIAL/PLATELET
Abs Immature Granulocytes: 0.02 10*3/uL (ref 0.00–0.07)
Basophils Absolute: 0.1 10*3/uL (ref 0.0–0.1)
Basophils Relative: 1 %
Eosinophils Absolute: 0.6 10*3/uL — ABNORMAL HIGH (ref 0.0–0.5)
Eosinophils Relative: 7 %
HCT: 37.7 % (ref 36.0–46.0)
Hemoglobin: 12.9 g/dL (ref 12.0–15.0)
Immature Granulocytes: 0 %
Lymphocytes Relative: 26 %
Lymphs Abs: 2.1 10*3/uL (ref 0.7–4.0)
MCH: 30 pg (ref 26.0–34.0)
MCHC: 34.2 g/dL (ref 30.0–36.0)
MCV: 87.7 fL (ref 80.0–100.0)
Monocytes Absolute: 0.6 10*3/uL (ref 0.1–1.0)
Monocytes Relative: 7 %
Neutro Abs: 4.7 10*3/uL (ref 1.7–7.7)
Neutrophils Relative %: 59 %
Platelets: 592 10*3/uL — ABNORMAL HIGH (ref 150–400)
RBC: 4.3 MIL/uL (ref 3.87–5.11)
RDW: 12.1 % (ref 11.5–15.5)
WBC: 8.1 10*3/uL (ref 4.0–10.5)
nRBC: 0 % (ref 0.0–0.2)

## 2020-12-09 LAB — COMPREHENSIVE METABOLIC PANEL
ALT: 12 U/L (ref 0–44)
AST: 13 U/L — ABNORMAL LOW (ref 15–41)
Albumin: 4.7 g/dL (ref 3.5–5.0)
Alkaline Phosphatase: 89 U/L (ref 38–126)
Anion gap: 10 (ref 5–15)
BUN: 14 mg/dL (ref 6–20)
CO2: 27 mmol/L (ref 22–32)
Calcium: 11 mg/dL — ABNORMAL HIGH (ref 8.9–10.3)
Chloride: 96 mmol/L — ABNORMAL LOW (ref 98–111)
Creatinine, Ser: 0.81 mg/dL (ref 0.44–1.00)
GFR, Estimated: >60 mL/min (ref >60)
Glucose, Bld: 224 mg/dL — ABNORMAL HIGH (ref 70–99)
Potassium: 3.8 mmol/L (ref 3.5–5.1)
Sodium: 133 mmol/L — ABNORMAL LOW (ref 135–145)
Total Bilirubin: 1 mg/dL (ref 0.3–1.2)
Total Protein: 8.6 g/dL — ABNORMAL HIGH (ref 6.5–8.1)

## 2020-12-09 LAB — PREGNANCY, URINE: Preg Test, Ur: NEGATIVE

## 2020-12-09 LAB — CBG MONITORING, ED: Glucose-Capillary: 239 mg/dL — ABNORMAL HIGH (ref 70–99)

## 2020-12-09 LAB — LIPASE, BLOOD: Lipase: <10 U/L — ABNORMAL LOW (ref 11–51)

## 2020-12-09 MED ORDER — METFORMIN HCL 500 MG PO TABS: 500.0000 mg | ORAL_TABLET | Freq: Two times a day (BID) | ORAL | 0 refills | Status: DC

## 2020-12-09 NOTE — Discharge Instructions (Signed)
I have started you on metformin please take as prescribed.  Have also given information regarding your diabetes please read.  Noted that you have a slightly elevated calcium as well as platelet count please follow with your PCP for further evaluation.  Come back to the emergency department if you develop chest pain, shortness of breath, severe abdominal pain, uncontrolled nausea, vomiting, diarrhea.

## 2020-12-09 NOTE — ED Triage Notes (Signed)
Pt reports here for blood sugar check. Pt denies any symptoms. Pt has not been diagnosed with diabetes.

## 2020-12-09 NOTE — ED Provider Notes (Signed)
[place] EMERGENCY DEPT Provider Note   CSN: [id number] Arrival date & time: 12/09/20  1334     History Chief Complaint  Patient presents with   Hyperglycemia    Yolanda Flores is a 35 y.o. female.  HPI  Patient with history including AKI, hypertension, pancreatitis, alcohol use presents to the ED with complaints of elevated blood sugar.  Patient states that she was getting her physical for work and they noted that her blood sugar was elevated they were unable to obtain a reading  just says " high".  She was then sent to urgent care where they concern for DKA, HSS and was sent here for further evaluation.  She denies  symptoms at this time, she denies headaches, feeling fatigued, increased thirst, stomach pain, frequent urination, nausea vomiting or diarrhea.  She states that she has no history of diabetes but she does says it runs in her family, she has not taken any medicine for this, she has no complaints at this time.  Denies alleviating or aggravating factors.  Does not endorse fevers, chills, chest pain, shortness of breath, abdominal pain, general body aches.  Past Medical History:  Diagnosis Date   AKI (acute kidney injury) ([place])    Hypertension    Pancreatitis     Patient Active Problem List   Diagnosis Date Noted   Macrocytic anemia 12/27/2019   Abdominal pain    Pancreatitis, acute 12/24/2019   Acute pancreatitis 12/24/2019   Normocytic anemia 01/17/2019   Thrombocytosis 01/17/2019   Heartburn    Odynophagia    Acute renal failure ([place]) 01/08/2019   Shock circulatory ([place]) 01/08/2019   Acute gallstone pancreatitis 01/04/2019   Pancreatitis 01/04/2019   Chest pain 05/24/2018   HTN (hypertension) 05/24/2018   Hypokalemia 05/24/2018   Lactic acidosis 05/24/2018   Tobacco abuse 05/24/2018   Alcohol use disorder, mild, in early remission 05/24/2018   Abnormal LFTs 05/24/2018   Prolonged QT interval     Past Surgical History:  Procedure  Laterality Date   FOOT SURGERY       OB History   No obstetric history on file.     Family History  Problem Relation Age of Onset   Hypertension Mother    Diabetes Mother    Hypertension Father    Hypertension Brother     Social History   Tobacco Use   Smoking status: Former    Packs/day: 4.00    Types: Cigarettes    Quit date: 05/24/2018    Years since quitting: 2.5   Smokeless tobacco: Never  Vaping Use   Vaping Use: Never used  Substance Use Topics   Alcohol use: Not Currently    Comment: occasionally   Drug use: No    Home Medications Prior to Admission medications   Medication Sig Start Date End Date Taking? Authorizing Provider  metFORMIN (GLUCOPHAGE) 500 MG tablet Take 1 tablet (500 mg total) by mouth 2 (two) times daily with a meal. 12/09/20 01/08/21 Yes Marcello Fennel, PA-C  amLODipine (NORVASC) 10 MG tablet Take 1 tablet (10 mg total) by mouth daily. Must keep OV to receive additional refills 01/12/20   Ladell Pier, MD  ferrous sulfate 325 (65 FE) MG tablet Take 1 tablet (325 mg total) by mouth 2 (two) times daily with a meal. 01/18/19   Hosie Poisson, MD  folic acid (FOLVITE) 1 MG tablet Take 1 tablet (1 mg total) by mouth daily. 01/18/19   Hosie Poisson, MD  hydrALAZINE (APRESOLINE)  10 MG tablet Take 2 tablets (20 mg total) by mouth every 8 (eight) hours. 02/07/19   Ladell Pier, MD  lisinopril (ZESTRIL) 20 MG tablet Take 20 mg by mouth daily.    [provider]  Multiple Vitamin (MULTIVITAMIN WITH MINERALS) TABS tablet Take 1 tablet by mouth daily. 01/18/19   Hosie Poisson, MD  oxyCODONE (OXY IR/ROXICODONE) 5 MG immediate release tablet Take 1 tablet (5 mg total) by mouth every 6 (six) hours as needed for moderate pain or severe pain. 12/28/19   Pokhrel, Corrie Mckusick, MD  pantoprazole (PROTONIX) 40 MG tablet NEEDS OFFICE VISIT FOR FURTHER REFILLS OR CAN GET FROM PCP 01/12/20   Esterwood, Amy S, PA-C  polyethylene glycol (MIRALAX / GLYCOLAX) 17 g  packet Take 17 g by mouth daily as needed for mild constipation. 01/17/19   Hosie Poisson, MD    Allergies    Ibuprofen  Review of Systems   Review of Systems  Constitutional:  Negative for chills and fever.  HENT:  Negative for congestion.   Respiratory:  Negative for shortness of breath.   Cardiovascular:  Negative for chest pain.  Gastrointestinal:  Negative for abdominal pain, diarrhea, nausea and vomiting.  Genitourinary:  Negative for enuresis.  Musculoskeletal:  Negative for back pain.  Skin:  Negative for rash.  Neurological:  Negative for dizziness.  Hematological:  Does not bruise/bleed easily.   Physical Exam Updated Vital Signs BP (!) 156/118 (BP Location: Left Arm)   Pulse 80   Temp 98.5 F (36.9 C) (Oral)   Resp 18   Ht 5' 7" (1.702 m)   Wt 93.9 kg   LMP 12/09/2020   SpO2 100%   BMI 32.42 kg/m   Physical Exam Vitals and nursing note reviewed.  Constitutional:      General: She is not in acute distress.    Appearance: She is not ill-appearing.  HENT:     Head: Normocephalic and atraumatic.     Nose: No congestion.  Eyes:     Conjunctiva/sclera: Conjunctivae normal.  Cardiovascular:     Rate and Rhythm: Normal rate and regular rhythm.     Pulses: Normal pulses.     Heart sounds: No murmur heard.   No friction rub. No gallop.  Pulmonary:     Effort: No respiratory distress.     Breath sounds: No wheezing, rhonchi or rales.  Abdominal:     Palpations: Abdomen is soft.     Tenderness: There is no abdominal tenderness. There is no right CVA tenderness or left CVA tenderness.  Musculoskeletal:     Right lower leg: No edema.     Left lower leg: No edema.  Skin:    General: Skin is warm and dry.  Neurological:     Mental Status: She is alert.  Psychiatric:        Mood and Affect: Mood normal.    ED Results / Procedures / Treatments   Labs (all labs ordered are listed, but only abnormal results are displayed) Labs Reviewed  LIPASE, BLOOD -  Abnormal; Notable for the following components:      Result Value   Lipase <10 (*)    All other components within normal limits  CBC WITH DIFFERENTIAL/PLATELET - Abnormal; Notable for the following components:   Platelets 592 (*)    Eosinophils Absolute 0.6 (*)    All other components within normal limits  COMPREHENSIVE METABOLIC PANEL - Abnormal; Notable for the following components:   Sodium 133 (*)  Chloride 96 (*)    Glucose, Bld 224 (*)    Calcium 11.0 (*)    Total Protein 8.6 (*)    AST 13 (*)    All other components within normal limits  URINALYSIS, ROUTINE W REFLEX MICROSCOPIC - Abnormal; Notable for the following components:   Color, Urine COLORLESS (*)    Hgb urine dipstick SMALL (*)    All other components within normal limits  CBG MONITORING, ED - Abnormal; Notable for the following components:   Glucose-Capillary 239 (*)    All other components within normal limits  PREGNANCY, URINE    EKG None  Radiology No results found.  Procedures Procedures   Medications Ordered in ED Medications - No data to display  ED Course  I have reviewed the triage vital signs and the nursing notes.  Pertinent labs & imaging results that were available during my care of the patient were reviewed by me and considered in my medical decision making (see chart for details).    MDM Rules/Calculators/A&P                          Initial impression-presents with concerns of high blood sugar.  She is alert no acute distress vital signs reassuring.  CBG shows 237, will obtain basic lab work-up for further evaluation.   Work-up-CBC unremarkable does show platelets of 592, CMP shows sodium 133, chloride 96, glucose 224, calcium 11, lipase less than 10, UA unremarkable.  Rule out-low suspicion for systemic infection patient nontoxic-appearing, vital signs reassuring, no leukocytosis present.  I have low suspicion for DKA or HSS as she has no anion gap, CO2 is within normal limits,  glucose is only mildly elevated at 239.  I have low suspicion for pancreatitis as she has no epigastric tenderness, lipase within normal limits.  Low suspicion for liver or gallbladder abnormality as she has no right upper quadrant, no elevation liver enzymes, alk phos, T bili.  Noted the patient does have an elevated platelet count of 592 this is nonspecific probable acute phase reactants, we will have her follow-up with her PCP for further evaluation.  Elevated calcium of 11 likely secondary to dehydration, will have her continue to hydrate, have this rechecked at her PCP visit.  Plan-  Elevated blood sugars-possible secondary due to undiagnosed type 2 diabetes, will start her on metformin, follow with food recommendations, follow with her PCP for further evaluation. Elevated platelet and calcium-patient was made aware of these findings, we will have her follow-up with her PCP for reevaluation.  Vital signs have remained stable, no indication for hospital admission.  Patient discussed with attending and they agreed with assessment and plan.  Patient given at home care as well strict return precautions.  Patient verbalized that they understood agreed to said plan.  Final Clinical Impression(s) / ED Diagnoses Final diagnoses:  Hyperglycemia    Rx / DC Orders ED Discharge Orders          Ordered    metFORMIN (GLUCOPHAGE) 500 MG tablet  2 times daily with meals        12/09/20 1814             Marcello Fennel, PA-C 12/09/20 1815    Blanchie Dessert, MD 12/13/20 1336

## 2021-02-26 ENCOUNTER — Ambulatory Visit: Payer: BLUE CROSS/BLUE SHIELD | Attending: Internal Medicine | Admitting: Internal Medicine

## 2021-02-26 ENCOUNTER — Other Ambulatory Visit: Payer: Self-pay

## 2021-02-26 ENCOUNTER — Encounter: Payer: Self-pay | Admitting: Internal Medicine

## 2021-02-26 VITALS — BP 129/87 | HR 73 | Resp 16 | Wt 211.2 lb

## 2021-02-26 DIAGNOSIS — I10 Essential (primary) hypertension: Secondary | ICD-10-CM | POA: Diagnosis not present

## 2021-02-26 DIAGNOSIS — Z6833 Body mass index (BMI) 33.0-33.9, adult: Secondary | ICD-10-CM

## 2021-02-26 DIAGNOSIS — F1011 Alcohol abuse, in remission: Secondary | ICD-10-CM | POA: Diagnosis not present

## 2021-02-26 DIAGNOSIS — E1169 Type 2 diabetes mellitus with other specified complication: Secondary | ICD-10-CM

## 2021-02-26 DIAGNOSIS — M79672 Pain in left foot: Secondary | ICD-10-CM | POA: Diagnosis not present

## 2021-02-26 DIAGNOSIS — Z87891 Personal history of nicotine dependence: Secondary | ICD-10-CM

## 2021-02-26 DIAGNOSIS — E669 Obesity, unspecified: Secondary | ICD-10-CM

## 2021-02-26 LAB — GLUCOSE, POCT (MANUAL RESULT ENTRY): POC Glucose: 190 mg/dl — AB (ref 70–99)

## 2021-02-26 MED ORDER — METFORMIN HCL 1000 MG PO TABS: 1000.0000 mg | ORAL_TABLET | Freq: Two times a day (BID) | ORAL | 3 refills | Status: DC

## 2021-02-26 MED ORDER — LISINOPRIL 2.5 MG PO TABS: 2.5000 mg | ORAL_TABLET | Freq: Every day | ORAL | 5 refills | Status: DC

## 2021-02-26 NOTE — Progress Notes (Signed)
Patient ID: Yolanda Flores, female    DOB: [birth year]  MRN: [id number]  CC: DM  Subjective: Yolanda Flores is a 36 y.o. female who presents for chronic ds management Her concerns today include:  Patient with history of HTN, tobacco dependence, EtOH use disorder, alcoholic pancreatitis  Pt hosp at [facility] in [place] 12/2020 for 2 days for new DM in DKA.  A1C was 10.2, LDL 64, GFR 101 Dischg on Lantus pen 10 units QHS and Metformin 1 gram BID She has been taking medications consistently. Checking BS 3-4x day mainly before meals She has log book.  Range 85-160; several readings in low 200s.   Tolerating meds Had nutrition teaching in [place].  Drinking mainly water now and has cut back on white stuff.  Trying to control snacking.  Used to snack a lot on junk foods.  Now snacking on fruits Not getting in any exercise.  Has a gym at her job.  Plans to start using it. No blurred vision.  Plans to schedule appt with [facility] for eye exam.  No numbness in feet.   Tob dep:  quit smoking about 2+ yrs ago  ETOH use disorder:  clean since 11/16/2020.  Completed a treatment program.  HTN:  off BP med x 2 yrs. 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  Sometimes she still get aching in the foot especially in rainy and cold weather.  She thinks she may has arthritis.  Swelling sometimes but not as much as it use to.     HM: Had flu shot at [facility] the end of December.  Overdue for Pap smear. Patient Active Problem List   Diagnosis Date Noted   Former smoker 02/26/2021   Diabetes mellitus type 2 in obese ([place]) 02/26/2021   Macrocytic anemia 12/27/2019   Abdominal pain    Pancreatitis, acute 12/24/2019   Acute pancreatitis 12/24/2019   Normocytic anemia 01/17/2019   Thrombocytosis 01/17/2019   Heartburn    Odynophagia    Acute renal failure ([place]) 01/08/2019   Shock circulatory ([place]) 01/08/2019   Acute gallstone pancreatitis  01/04/2019   Pancreatitis 01/04/2019   Chest pain 05/24/2018   Essential hypertension 05/24/2018   Hypokalemia 05/24/2018   Lactic acidosis 05/24/2018   Tobacco abuse 05/24/2018   Alcohol use disorder, mild, in early remission 05/24/2018   Abnormal LFTs 05/24/2018   Prolonged QT interval      Current Outpatient Medications on File Prior to Visit  Medication Sig Dispense Refill   Multiple Vitamin (MULTIVITAMIN WITH MINERALS) TABS tablet Take 1 tablet by mouth daily. (Patient not taking: Reported on 02/26/2021)     No current facility-administered medications on file prior to visit.    Allergies  Allergen Reactions   Ibuprofen     Social History   Socioeconomic History   Marital status: Single    Spouse name: Not on file   Number of children: Not on file   Years of education: Not on file   Highest education level: Not on file  Occupational History   Not on file  Tobacco Use   Smoking status: Former    Packs/day: 4.00    Types: Cigarettes    Quit date: 05/24/2018    Years since quitting: 2.7   Smokeless tobacco: Never  Vaping Use   Vaping Use: Never used  Substance and Sexual Activity   Alcohol use: Not Currently  Comment: occasionally   Drug use: No   Sexual activity: Yes  Other Topics Concern   Not on file  Social History Narrative   Not on file   Social Determinants of Health   Financial Resource Strain: Not on file  Food Insecurity: Not on file  Transportation Needs: Not on file  Physical Activity: Not on file  Stress: Not on file  Social Connections: Not on file  Intimate Partner Violence: Not on file    Family History  Problem Relation Age of Onset   Hypertension Mother    Diabetes Mother    Hypertension Father    Hypertension Brother     Past Surgical History:  Procedure Laterality Date   FOOT SURGERY      ROS: Review of Systems Negative except as stated above  PHYSICAL EXAM: BP 129/87    Pulse 73    Resp 16    Wt 211 lb 3.2 oz  (95.8 kg)    SpO2 100%    BMI 33.08 kg/m   Physical Exam Repeat blood pressure 121/87 General appearance - alert, well appearing, and in no distress Mental status - normal mood, behavior, speech, dress, motor activity, and thought processes Neck - supple, no significant adenopathy Chest - clear to auscultation, no wheezes, rales or rhonchi, symmetric air entry Heart - normal rate, regular rhythm, normal S1, S2, no murmurs, rubs, clicks or gallops Extremities - peripheral pulses normal, no pedal edema, no clubbing or cyanosis.  Patient is wearing an ankle tracking device on the right ankle. : MSK: No edema or erythema noted of the left foot.  Good range of motion at the ankle. Diabetic Foot Exam - Simple   Simple Foot Form Visual Inspection No deformities, no ulcerations, no other skin breakdown bilaterally: Yes Sensation Testing Intact to touch and monofilament testing bilaterally: Yes Pulse Check Posterior Tibialis and Dorsalis pulse intact bilaterally: Yes Comments       CMP Latest Ref Rng & Units 12/09/2020 12/28/2019 12/27/2019  Glucose 70 - 99 mg/dL 224(H) 122(H) 114(H)  BUN 6 - 20 mg/dL 14 <5(L) <5(L)  Creatinine 0.44 - 1.00 mg/dL 0.81 0.55 0.55  Sodium 135 - 145 mmol/L 133(L) 136 138  Potassium 3.5 - 5.1 mmol/L 3.8 3.5 3.5  Chloride 98 - 111 mmol/L 96(L) 98 96(L)  CO2 22 - 32 mmol/L 27 27 29   Calcium 8.9 - 10.3 mg/dL 11.0(H) 8.3(L) 8.1(L)  Total Protein 6.5 - 8.1 g/dL 8.6(H) 7.1 6.5  Total Bilirubin 0.3 - 1.2 mg/dL 1.0 1.0 1.6(H)  Alkaline Phos 38 - 126 U/L 89 92 90  AST 15 - 41 U/L 13(L) 174(H) 193(H)  ALT 0 - 44 U/L 12 70(H) 73(H)   Lipid Panel     Component Value Date/Time   CHOL 389 (H) 05/25/2018 0403   TRIG 758 (H) 12/24/2019 1616   HDL >135 05/25/2018 0403   CHOLHDL NOT CALCULATED 05/25/2018 0403   VLDL 15 05/25/2018 0403   LDLCALC NOT CALCULATED 05/25/2018 0403    CBC    Component Value Date/Time   WBC 8.1 12/09/2020 1723   RBC 4.30 12/09/2020  1723   HGB 12.9 12/09/2020 1723   HCT 37.7 12/09/2020 1723   PLT 592 (H) 12/09/2020 1723   MCV 87.7 12/09/2020 1723   MCH 30.0 12/09/2020 1723   MCHC 34.2 12/09/2020 1723   RDW 12.1 12/09/2020 1723   LYMPHSABS 2.1 12/09/2020 1723   MONOABS 0.6 12/09/2020 1723   EOSABS 0.6 (H) 12/09/2020 1723  BASOSABS 0.1 12/09/2020 1723    ASSESSMENT AND PLAN:  1. Diabetes mellitus type 2 in obese ([facility]) Blood sugar log reviewed.  Blood sugars are not bad.  Goal for fasting blood sugar is 90-130. Discussed the importance of healthy eating habits, regular aerobic exercise (at least 150 minutes a week as tolerated) and medication compliance to achieve and maintain control of diabetes and prevent complications. Continue metformin and Lantus insulin.  Patient reports that the Lantus pens were not covered by her insurance.  I did check with our clinical pharmacist.  It looks like she has to meet a certain deductible before her insurance would start paying for the insulin.  This appeared to be the same for other long-acting insulin that we try to run against her insurance including Levemir, Semglee, glargine, NPH.  Advised patient to call her insurance and find out what her out-of-pocket max is before they would cover the Lantus.  Should also find out if there is a preferred long-acting insulin. -Patient reminded to get eye exam done - POCT glucose (manual entry) - metFORMIN (GLUCOPHAGE) 1000 MG tablet; Take 1 tablet (1,000 mg total) by mouth 2 (two) times daily with a meal.  Dispense: 180 tablet; Refill: 3  2. Former smoker Commended her on quitting.  Encouraged her to remain tobacco free.  3. Essential hypertension Diastolic blood pressure not at goal.  DASH diet discussed and encouraged.  I recommend restarting low-dose of lisinopril.  Informed patient that the lisinopril can sometimes cause swelling of the lip/tongue.  If this occurs she should stop the medication and be seen in the emergency room.  Also  advised that if she decides to plan a pregnancy or gets pregnant, she needs to stop the medication as it is contraindicated in pregnancy. - lisinopril (ZESTRIL) 2.5 MG tablet; Take 1 tablet (2.5 mg total) by mouth daily.  Dispense: 30 tablet; Refill: 5  4. Alcohol use disorder, mild, in early remission Commended on quitting.  Encouraged her to remain free of alcohol.  5. Foot pain, left - DG Foot Complete Left; Future    Patient was given the opportunity to ask questions.  Patient verbalized understanding of the plan and was able to repeat key elements of the plan.   Orders Placed This Encounter  Procedures   DG Foot Complete Left   POCT glucose (manual entry)     Requested Prescriptions   Signed Prescriptions Disp Refills   lisinopril (ZESTRIL) 2.5 MG tablet 30 tablet 5    Sig: Take 1 tablet (2.5 mg total) by mouth daily.   metFORMIN (GLUCOPHAGE) 1000 MG tablet 180 tablet 3    Sig: Take 1 tablet (1,000 mg total) by mouth 2 (two) times daily with a meal.    Return in about 6 weeks (around 04/09/2021) for PAP.  Karle Plumber, MD, FACP

## 2021-02-26 NOTE — Patient Instructions (Signed)
Continue to check your blood sugars before meals.  The goal for blood sugars before meals is 90-130.  Please call your pharmacy and ask whether the Levemir, Semglee or glargine insulin pen is preferred on their formulary.  Please remember to call and schedule your eye appointment.  Go to the radiology department at [facility] to get the x-ray done of the left foot.  Diabetes Mellitus and Standards of [place] with and managing diabetes (diabetes mellitus) can be complicated. Your diabetes treatment may be managed by a team of health care providers, including: A physician who specializes in diabetes (endocrinologist). You might also have visits with a nurse practitioner or physician assistant. Nurses. A registered dietitian. A certified diabetes care and education specialist. An exercise specialist. A pharmacist. An eye doctor. A foot specialist (podiatrist). A dental care provider. A primary care provider. A mental health care provider. How to manage your diabetes You can do many things to successfully manage your diabetes. Your health care providers will follow guidelines to help you get the best quality of care. Here are general guidelines for your diabetes management plan. Your health care providers may give you more specific instructions. Physical exams When you are diagnosed with diabetes, and each year after that, your health care provider will ask about your medical and family history. You will have a physical exam, which may include: Measuring your height, weight, and body mass index (BMI). Checking your blood pressure. This will be done at every routine medical visit. Your target blood pressure may vary depending on your medical conditions, your age, and other factors. A thyroid exam. A skin exam. Screening for nerve damage (peripheral neuropathy). This may include checking the pulse in your legs and feet and the level of sensation in your hands and feet. A foot exam  to inspect the structure and skin of your feet, including checking for cuts, bruises, redness, blisters, sores, or other problems. Screening for blood vessel (vascular) problems. This may include checking the pulse in your legs and feet and checking your temperature. Blood tests Depending on your treatment plan and your personal needs, you may have the following tests: Hemoglobin A1C (HbA1C). This test provides information about blood sugar (glucose) control over the previous 2-3 months. It is used to adjust your treatment plan, if needed. This test will be done: At least 2 times a year, if you are meeting your treatment goals. 4 times a year, if you are not meeting your treatment goals or if your goals have changed. Lipid testing, including total cholesterol, LDL and HDL cholesterol, and triglyceride levels. The goal for LDL is less than 100 mg/dL (5.5 mmol/L). If you are at high risk for complications, the goal is less than 70 mg/dL (3.9 mmol/L). The goal for HDL is 40 mg/dL (2.2 mmol/L) or higher for men, and 50 mg/dL (2.8 mmol/L) or higher for women. An HDL cholesterol of 60 mg/dL (3.3 mmol/L) or higher gives some protection against heart disease. The goal for triglycerides is less than 150 mg/dL (8.3 mmol/L). Liver function tests. Kidney function tests. Thyroid function tests.  Dental and eye exams  Visit your dentist two times a year. If you have type 1 diabetes, your health care provider may recommend an eye exam within 5 years after you are diagnosed, and then once a year after your first exam. For children with type 1 diabetes, the health care provider may recommend an eye exam when your child is age 35 or older and has  had diabetes for 3-5 years. After the first exam, your child should get an eye exam once a year. If you have type 2 diabetes, your health care provider may recommend an eye exam as soon as you are diagnosed, and then every 1-2 years after your first  exam. Immunizations A yearly flu (influenza) vaccine is recommended annually for everyone 6 months or older. This is especially important if you have diabetes. The pneumonia (pneumococcal) vaccine is recommended for everyone 2 years or older who has diabetes. If you are age 69 or older, you may get the pneumonia vaccine as a series of two separate shots. The hepatitis B vaccine is recommended for adults shortly after being diagnosed with diabetes. Adults and children with diabetes should receive all other vaccines according to age-specific recommendations from the Centers for Disease Control and Prevention (CDC). Mental and emotional health Screening for symptoms of eating disorders, anxiety, and depression is recommended at the time of diagnosis and after as needed. If your screening shows that you have symptoms, you may need more evaluation. You may work with a mental health care provider. Follow these instructions at home: Treatment plan You will monitor your blood glucose levels and may give yourself insulin. Your treatment plan will be reviewed at every medical visit. You and your health care provider will discuss: How you are taking your medicines, including insulin. Any side effects you have. Your blood glucose level target goals. How often you monitor your blood glucose level. Lifestyle habits, such as activity level and tobacco, alcohol, and substance use. Education Your health care provider will assess how well you are monitoring your blood glucose levels and whether you are taking your insulin and medicines correctly. He or she may refer you to: A certified diabetes care and education specialist to manage your diabetes throughout your life, starting at diagnosis. A registered dietitian who can create and review your personal nutrition plan. An exercise specialist who can discuss your activity level and exercise plan. General instructions Take over-the-counter and prescription  medicines only as told by your health care provider. Keep all follow-up visits. This is important. Where to find support There are many diabetes support networks, including: American Diabetes Association (ADA): diabetes.org Defeat Diabetes Foundation: defeatdiabetes.org Where to find more information American Diabetes Association (ADA): www.diabetes.org Association of Diabetes Care & Education Specialists (ADCES): diabeteseducator.org International Diabetes Federation (IDF): https://www.munoz-bell.org/ Summary Managing diabetes (diabetes mellitus) can be complicated. Your diabetes treatment may be managed by a team of health care providers. Your health care providers follow guidelines to help you get the best quality care. You should have physical exams, blood tests, blood pressure monitoring, immunizations, and screening tests regularly. Stay updated on how to manage your diabetes. Your health care providers may also give you more specific instructions based on your individual health. This information is not intended to replace advice given to you by your health care provider. Make sure you discuss any questions you have with your health care provider. Document Revised: 07/07/2019 Document Reviewed: 07/07/2019 Elsevier Patient Education  [place].

## 2021-04-12 ENCOUNTER — Ambulatory Visit: Payer: BLUE CROSS/BLUE SHIELD

## 2021-04-12 ENCOUNTER — Ambulatory Visit: Payer: BLUE CROSS/BLUE SHIELD | Attending: Internal Medicine | Admitting: Internal Medicine

## 2021-04-12 DIAGNOSIS — E1169 Type 2 diabetes mellitus with other specified complication: Secondary | ICD-10-CM

## 2021-04-13 LAB — HEMOGLOBIN A1C
Est. average glucose Bld gHb Est-mCnc: 163 mg/dL
Hgb A1c MFr Bld: 7.3 % — ABNORMAL HIGH (ref 4.8–5.6)

## 2021-05-13 ENCOUNTER — Encounter: Payer: Self-pay | Admitting: Internal Medicine

## 2021-05-13 ENCOUNTER — Ambulatory Visit: Payer: BLUE CROSS/BLUE SHIELD | Attending: Internal Medicine | Admitting: Internal Medicine

## 2021-05-13 ENCOUNTER — Other Ambulatory Visit (HOSPITAL_COMMUNITY)
Admission: RE | Admit: 2021-05-13 | Discharge: 2021-05-13 | Disposition: A | Discharge status: Home / Self Care | Payer: BLUE CROSS/BLUE SHIELD | Source: Ambulatory Visit | Attending: Internal Medicine | Admitting: Internal Medicine

## 2021-05-13 VITALS — BP 116/85 | HR 92 | Resp 16 | Ht 67.0 in | Wt 215.0 lb

## 2021-05-13 DIAGNOSIS — I10 Essential (primary) hypertension: Secondary | ICD-10-CM | POA: Diagnosis not present

## 2021-05-13 DIAGNOSIS — E1169 Type 2 diabetes mellitus with other specified complication: Secondary | ICD-10-CM | POA: Diagnosis not present

## 2021-05-13 DIAGNOSIS — E669 Obesity, unspecified: Secondary | ICD-10-CM | POA: Diagnosis not present

## 2021-05-13 DIAGNOSIS — Z23 Encounter for immunization: Secondary | ICD-10-CM | POA: Diagnosis not present

## 2021-05-13 DIAGNOSIS — Z3009 Encounter for other general counseling and advice on contraception: Secondary | ICD-10-CM

## 2021-05-13 DIAGNOSIS — Z6833 Body mass index (BMI) 33.0-33.9, adult: Secondary | ICD-10-CM | POA: Diagnosis not present

## 2021-05-13 DIAGNOSIS — Z124 Encounter for screening for malignant neoplasm of cervix: Secondary | ICD-10-CM

## 2021-05-13 LAB — GLUCOSE, POCT (MANUAL RESULT ENTRY): POC Glucose: 128 mg/dl — AB (ref 70–99)

## 2021-05-13 MED ORDER — LANTUS SOLOSTAR 100 UNIT/ML ~~LOC~~ SOPN: 10.0000 [IU] | PEN_INJECTOR | Freq: Every day | SUBCUTANEOUS | 11 refills | Status: DC

## 2021-05-13 NOTE — Patient Instructions (Addendum)
Please let me know if you decide to do the Nexplanon implant for birth control or the IUD. ? ?Check with your insurance to see if they would cover for Trulicity and how much her co-pay would be. ? ?Diabetes Mellitus and Nutrition, Adult ?When you have diabetes, or diabetes mellitus, it is very important to have healthy eating habits because your blood sugar (glucose) levels are greatly affected by what you eat and drink. Eating healthy foods in the right amounts, at about the same times every day, can help you: ?Manage your blood glucose. ?Lower your risk of heart disease. ?Improve your blood pressure. ?Reach or maintain a healthy weight. ?What can affect my meal plan? ?Every person with diabetes is different, and each person has different needs for a meal plan. Your health care provider may recommend that you work with a dietitian to make a meal plan that is best for you. Your meal plan may vary depending on factors such as: ?The calories you need. ?The medicines you take. ?Your weight. ?Your blood glucose, blood pressure, and cholesterol levels. ?Your activity level. ?Other health conditions you have, such as heart or kidney disease. ?How do carbohydrates affect me? ?Carbohydrates, also called carbs, affect your blood glucose level more than any other type of food. Eating carbs raises the amount of glucose in your blood. ?It is important to know how many carbs you can safely have in each meal. This is different for every person. Your dietitian can help you calculate how many carbs you should have at each meal and for each snack. ?How does alcohol affect me? ?Alcohol can cause a decrease in blood glucose (hypoglycemia), especially if you use insulin or take certain diabetes medicines by mouth. Hypoglycemia can be a life-threatening condition. Symptoms of hypoglycemia, such as sleepiness, dizziness, and confusion, are similar to symptoms of having too much alcohol. ?Do not drink alcohol if: ?Your health care  provider tells you not to drink. ?You are pregnant, may be pregnant, or are planning to become pregnant. ?If you drink alcohol: ?Limit how much you have to: ?0-1 drink a day for women. ?0-2 drinks a day for men. ?Know how much alcohol is in your drink. In the [place] one drink equals one 12 oz bottle of beer (355 mL), one 5 oz glass of wine (148 mL), or one 1? oz glass of hard liquor (44 mL). ?Keep yourself hydrated with water, diet soda, or unsweetened iced tea. Keep in mind that regular soda, juice, and other mixers may contain a lot of sugar and must be counted as carbs. ?What are tips for following this plan? ? ?Reading food labels ?Start by checking the serving size on the Nutrition Facts label of packaged foods and drinks. The number of calories and the amount of carbs, fats, and other nutrients listed on the label are based on one serving of the item. Many items contain more than one serving per package. ?Check the total grams (g) of carbs in one serving. ?Check the number of grams of saturated fats and trans fats in one serving. Choose foods that have a low amount or none of these fats. ?Check the number of milligrams (mg) of salt (sodium) in one serving. Most people should limit total sodium intake to less than 2,300 mg per day. ?Always check the nutrition information of foods labeled as "low-fat" or "nonfat." These foods may be higher in added sugar or refined carbs and should be avoided. ?Talk to your dietitian to identify your daily goals  for nutrients listed on the label. ?Shopping ?Avoid buying canned, pre-made, or processed foods. These foods tend to be high in fat, sodium, and added sugar. ?Shop around the outside edge of the grocery store. This is where you will most often find fresh fruits and vegetables, bulk grains, fresh meats, and fresh dairy products. ?Cooking ?Use low-heat cooking methods, such as baking, instead of high-heat cooking methods, such as deep frying. ?Cook using healthy oils, such  as olive, canola, or sunflower oil. ?Avoid cooking with butter, cream, or high-fat meats. ?Meal planning ?Eat meals and snacks regularly, preferably at the same times every day. Avoid going long periods of time without eating. ?Eat foods that are high in fiber, such as fresh fruits, vegetables, beans, and whole grains. ?Eat 4-6 oz (112-168 g) of lean protein each day, such as lean meat, chicken, fish, eggs, or tofu. One ounce (oz) (28 g) of lean protein is equal to: ?1 oz (28 g) of meat, chicken, or fish. ?1 egg. ?? cup (62 g) of tofu. ?Eat some foods each day that contain healthy fats, such as avocado, nuts, seeds, and fish. ?What foods should I eat? ?Fruits ?Berries. Apples. Oranges. Peaches. Apricots. Plums. Grapes. Mangoes. Papayas. Pomegranates. Kiwi. Cherries. ?Vegetables ?Leafy greens, including lettuce, spinach, kale, chard, collard greens, mustard greens, and cabbage. Beets. Cauliflower. Broccoli. Carrots. Green beans. Tomatoes. Peppers. Onions. Cucumbers. Brussels sprouts. ?Grains ?Whole grains, such as whole-wheat or whole-grain bread, crackers, tortillas, cereal, and pasta. Unsweetened oatmeal. Quinoa. Brown or wild rice. ?Meats and other proteins ?Seafood. Poultry without skin. Lean cuts of poultry and beef. Tofu. Nuts. Seeds. ?Dairy ?Low-fat or fat-free dairy products such as milk, yogurt, and cheese. ?The items listed above may not be a complete list of foods and beverages you can eat and drink. Contact a dietitian for more information. ?What foods should I avoid? ?Fruits ?Fruits canned with syrup. ?Vegetables ?Canned vegetables. Frozen vegetables with butter or cream sauce. ?Grains ?Refined white flour and flour products such as bread, pasta, snack foods, and cereals. Avoid all processed foods. ?Meats and other proteins ?Fatty cuts of meat. Poultry with skin. Breaded or fried meats. Processed meat. Avoid saturated fats. ?Dairy ?Full-fat yogurt, cheese, or milk. ?Beverages ?Sweetened drinks, such as  soda or iced tea. ?The items listed above may not be a complete list of foods and beverages you should avoid. Contact a dietitian for more information. ?Questions to ask a health care provider ?Do I need to meet with a certified diabetes care and education specialist? ?Do I need to meet with a dietitian? ?What number can I call if I have questions? ?When are the best times to check my blood glucose? ?Where to find more information: ?American Diabetes Association: diabetes.org ?Academy of Nutrition and Dietetics: eatright.org ?[employer] of Diabetes and Digestive and Kidney Diseases: AmenCredit.is ?Association of Diabetes Care & Education Specialists: diabeteseducator.org ?Summary ?It is important to have healthy eating habits because your blood sugar (glucose) levels are greatly affected by what you eat and drink. It is important to use alcohol carefully. ?A healthy meal plan will help you manage your blood glucose and lower your risk of heart disease. ?Your health care provider may recommend that you work with a dietitian to make a meal plan that is best for you. ?This information is not intended to replace advice given to you by your health care provider. Make sure you discuss any questions you have with your health care provider. ?Document Revised: 08/03/2019 Document Reviewed: 08/03/2019 ?Elsevier Patient Education ?  [place]. ? ?

## 2021-05-13 NOTE — Progress Notes (Addendum)
? ? ?Patient ID: Yolanda Flores, female    DOB: [birth year]  MRN: [id number] ? ?CC: Gynecologic Exam ? ? ?Subjective: ?Yolanda Flores is a 36 y.o. female who presents for PAP ?Her concerns today include:  ?Patient with history of DM, HTN, tobacco dependence, EtOH use disorder, alcoholic pancreatitis ? ?GYN History:  ?Pt is G0P0 ?Any hx of abn paps?: No ?Menses regular or irregular?:  yes ?How long does menses last? 4-6 days ?Menstrual flow light or heavy?: Heavy the 1st 3 days with cramps.   was on BCP in past for heavy menses but made bleeding worse.  Uses heating pad for the cramps ?Method of birth control?:  no.  Uses condoms sometimes.  Does not wish to get pregnant.  Does not want Depo or IUD. ?Any vaginal dischg at this time?:  no ?Dysuria?: no ?Any hx of STI?:  no ?Sexually active with how many partners: 48 female partner x 3 yrs ?Desires STI screen: no ?Last MMG: NA ?Family hx of uterine, cervical or breast cancer?:  no ? ?DM: On metformin and Lantus insulin 10 units daily.  On last visit she reported that Lantus pens were not covered by her insurance.  We found out that it is covered but she has a deductible.  Has a $7500 deductible to overcome before insurance would start paying for the Lantus insulin.  Down to last pen of Lantus. Will get [employer].  Prefers to stay on pen rather than viles. ?Compliant with Metformin 1 gram BID ?Checking BS 3-4 x a day before meals.  Gives range 90-143 ?Feels she does well with eating habits.   ?Not getting in any exercise.  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  Wgh up 8 lbs since 11/2020 ?Results for orders placed or performed in visit on 05/13/21  ?POCT glucose (manual entry)  ?Result Value Ref Range  ? POC Glucose 128 (A) 70 - 99 mg/dl  ? ?HTN: checks BP every morning.  Last reading 120/82, 130/79 ?Limits salt ?Taking Lisinopril as prescribed ? ?Still free of ETOH since 11/2020.  Wears ETOH monitor. ? ?HM:  due for Tdapt, DM eye exam ? ?Patient Active Problem List  ?  Diagnosis Date Noted  ? Former smoker 02/26/2021  ? Diabetes mellitus type 2 in obese ([place]) 02/26/2021  ? Macrocytic anemia 12/27/2019  ? Normocytic anemia 01/17/2019  ? Thrombocytosis 01/17/2019  ? Heartburn   ? Pancreatitis 01/04/2019  ? Essential hypertension 05/24/2018  ? Alcohol use disorder, mild, in early remission 05/24/2018  ? Abnormal LFTs 05/24/2018  ? Prolonged QT interval   ?  ? ?Current Outpatient Medications on File Prior to Visit  ?Medication Sig Dispense Refill  ? lisinopril (ZESTRIL) 2.5 MG tablet Take 1 tablet (2.5 mg total) by mouth daily. 30 tablet 5  ? metFORMIN (GLUCOPHAGE) 1000 MG tablet Take 1 tablet (1,000 mg total) by mouth 2 (two) times daily with a meal. 180 tablet 3  ? Multiple Vitamin (MULTIVITAMIN WITH MINERALS) TABS tablet Take 1 tablet by mouth daily.    ? ?No current facility-administered medications on file prior to visit.  ? ? ?Allergies  ?Allergen Reactions  ? Ibuprofen   ? ? ?Social History  ? ?Socioeconomic History  ? Marital status: Single  ?  Spouse name: Not on file  ? Number of children: Not on file  ? Years of education: Not on file  ? Highest education level: Not on file  ?Occupational History  ? Not on file  ?Tobacco  Use  ? Smoking status: Former  ?  Packs/day: 4.00  ?  Types: Cigarettes  ?  Quit date: 05/24/2018  ?  Years since quitting: 2.9  ? Smokeless tobacco: Never  ?Vaping Use  ? Vaping Use: Never used  ?Substance and Sexual Activity  ? Alcohol use: Not Currently  ?  Comment: occasionally  ? Drug use: No  ? Sexual activity: Yes  ?Other Topics Concern  ? Not on file  ?Social History Narrative  ? Not on file  ? ?Social Determinants of Health  ? ?Financial Resource Strain: Not on file  ?Food Insecurity: Not on file  ?Transportation Needs: Not on file  ?Physical Activity: Not on file  ?Stress: Not on file  ?Social Connections: Not on file  ?Intimate Partner Violence: Not on file  ? ? ?Family History  ?Problem Relation Age of Onset  ? Hypertension Mother   ? Diabetes  Mother   ? Hypertension Father   ? Hypertension Brother   ? ? ?Past Surgical History:  ?Procedure Laterality Date  ? FOOT SURGERY    ? ? ?ROS: ?Review of Systems ?Negative except as stated above ? ?PHYSICAL EXAM: ?BP 116/85 (BP Location: Left Arm, Patient Position: Sitting, Cuff Size: Normal)   Pulse 92   Resp 16   Ht '5\' 7"'$  (1.702 m)   Wt 215 lb (97.5 kg)   SpO2 100%   BMI 33.67 kg/m?   ?Wt Readings from Last 3 Encounters:  ?05/13/21 215 lb (97.5 kg)  ?02/26/21 211 lb 3.2 oz (95.8 kg)  ?12/09/20 207 lb (93.9 kg)  ? ? ?Physical Exam ? ?General appearance - alert, well appearing, and in no distress ?Mental status - normal mood, behavior, speech, dress, motor activity, and thought processes ?Pelvic - RN Tim Lair Reppert present:  normal external genitalia, vulva, vagina, cervix, uterus and adnexa ? ? ? ?  Latest Ref Rng & Units 12/09/2020  ?  5:23 PM 12/28/2019  ?  4:39 AM 12/27/2019  ?  4:12 AM  ?CMP  ?Glucose 70 - 99 mg/dL 224   122   114    ?BUN 6 - 20 mg/dL 14   <5   <5    ?Creatinine 0.44 - 1.00 mg/dL 0.81   0.55   0.55    ?Sodium 135 - 145 mmol/L 133   136   138    ?Potassium 3.5 - 5.1 mmol/L 3.8   3.5   3.5    ?Chloride 98 - 111 mmol/L 96   98   96    ?CO2 22 - 32 mmol/L '27   27   29    '$ ?Calcium 8.9 - 10.3 mg/dL 11.0   8.3   8.1    ?Total Protein 6.5 - 8.1 g/dL 8.6   7.1   6.5    ?Total Bilirubin 0.3 - 1.2 mg/dL 1.0   1.0   1.6    ?Alkaline Phos 38 - 126 U/L 89   92   90    ?AST 15 - 41 U/L 13   174   193    ?ALT 0 - 44 U/L 12   70   73    ? ?Lipid Panel  ?   ?Component Value Date/Time  ? CHOL 389 (H) 05/25/2018 0403  ? TRIG 758 (H) 12/24/2019 1616  ? HDL >135 05/25/2018 0403  ? CHOLHDL NOT CALCULATED 05/25/2018 0403  ? VLDL 15 05/25/2018 0403  ? [place] NOT CALCULATED 05/25/2018 0403  ? ? ?CBC ?   ?  Component Value Date/Time  ? WBC 8.1 12/09/2020 1723  ? RBC 4.30 12/09/2020 1723  ? HGB 12.9 12/09/2020 1723  ? HCT 37.7 12/09/2020 1723  ? PLT 592 (H) 12/09/2020 1723  ? MCV 87.7 12/09/2020 1723  ? MCH 30.0  12/09/2020 1723  ? MCHC 34.2 12/09/2020 1723  ? RDW 12.1 12/09/2020 1723  ? LYMPHSABS 2.1 12/09/2020 1723  ? MONOABS 0.6 12/09/2020 1723  ? EOSABS 0.6 (H) 12/09/2020 1723  ? BASOSABS 0.1 12/09/2020 1723  ? ? ?ASSESSMENT AND PLAN: ? ?1. Diabetes mellitus type 2 in obese [facility]) ?BS are acceptable.   ?Pt feels she can continue to afford the Lantus with her GoodRx coupon until she meets her deductible.  Continue Metformin. ?She would be a good candidate for Trulicity or Ozempic in combination with metformin instead of the insulin.  However she will again have the problem at the cost due to her high deductible. ?Encouraged her to try to get in some form of moderate intensity exercise at least 5 days a week for 30 minutes. ?- POCT glucose (manual entry) ?- Ambulatory referral to Ophthalmology ?- insulin glargine (LANTUS SOLOSTAR) 100 UNIT/ML Solostar Pen; Inject 10 Units into the skin at bedtime.  Dispense: 15 mL; Refill: 11 ? ?2. Pap smear for cervical cancer screening ?- Cytology - PAP ? ?3. Counseling for birth control, oral contraceptives ?We discussed family-planning since she does not wish to get pregnant at this time.  I went over birth control methods with her and which ones would be more appropriate given her chronic medical conditions including diabetes and blood pressure.Recommend IUD, Nexplanon or progesterone only pill.  Patient states she will think about it and let me know. ? ?4. Essential hypertension ?Good home blood pressure readings.  Continue low-dose lisinopril.  Advised that should she decide to become pregnant she should let me know as we will need to stop the lisinopril and change to something different as it is contraindicated in pregnancy.  She expressed understanding. ? ?5. Need for Tdap vaccination ?Given today. ? ? ?Patient was given the opportunity to ask questions.  Patient verbalized understanding of the plan and was able to repeat key elements of the plan.  ? ?This documentation was  completed using Radio producer.  Any transcriptional errors are unintentional. ? ?Orders Placed This Encounter  ?Procedures  ? Tdap vaccine greater than or equal to 7yo IM  ? Ambulatory referral to Ophth

## 2021-05-16 LAB — CYTOLOGY - PAP
Comment: NEGATIVE
Comment: NEGATIVE
Comment: NEGATIVE
Diagnosis: NEGATIVE
HPV 16: NEGATIVE
HPV 18 / 45: NEGATIVE
High risk HPV: POSITIVE — AB

## 2021-09-13 ENCOUNTER — Ambulatory Visit: Payer: BLUE CROSS/BLUE SHIELD | Admitting: Internal Medicine

## 2021-09-13 ENCOUNTER — Other Ambulatory Visit: Payer: Self-pay | Admitting: Internal Medicine

## 2021-09-13 DIAGNOSIS — I10 Essential (primary) hypertension: Secondary | ICD-10-CM

## 2021-09-16 IMAGING — CT CT ABD-PELV W/O CM
2 of 4 series · 15 of 46 positions shown, 17 images · non-contrast
Comparison: MRCP 01/05/2019

CLINICAL DATA: Abdominal pain, fever and elevated white count

EXAM:
CT ABDOMEN AND PELVIS WITHOUT CONTRAST
TECHNIQUE: Multidetector CT imaging of the abdomen and pelvis was performed
following the standard protocol without IV contrast.

[Series 2: axial st · axial · 0.73mm/px · z∈[-530,-80]mm · 12 of 100 slices shown, 14 images]
[im 5/100  soft-tissue]
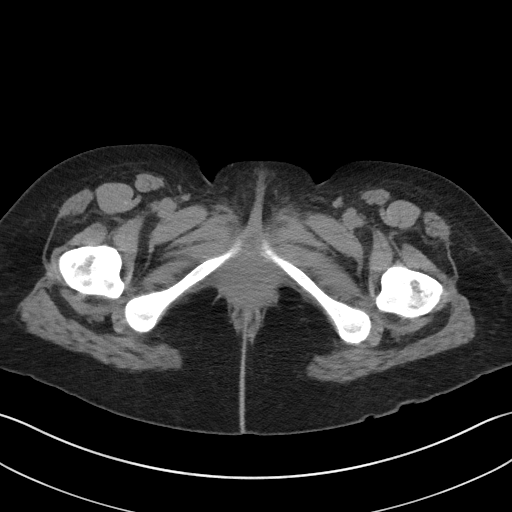
[im 5/100  bone]
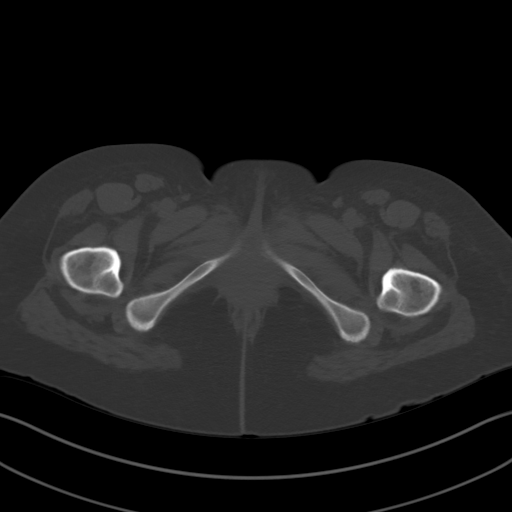
[im 15/100  soft-tissue]
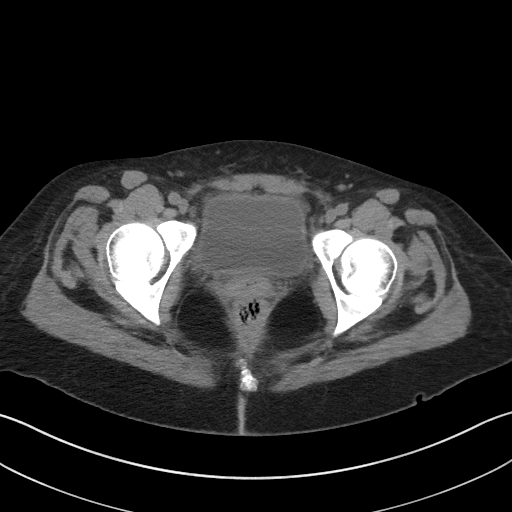
[im 20/100  soft-tissue]
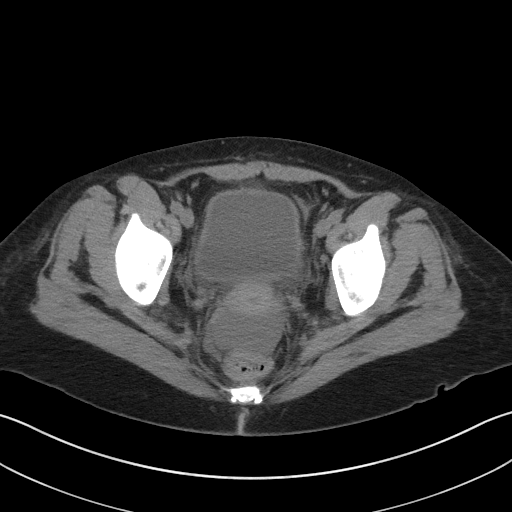
[im 30/100  soft-tissue]
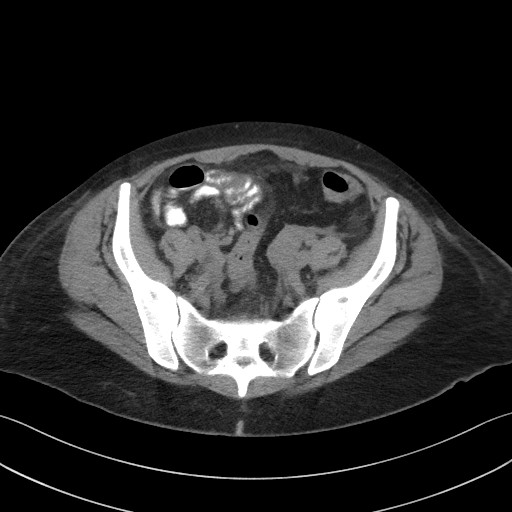
[im 40/100  soft-tissue]
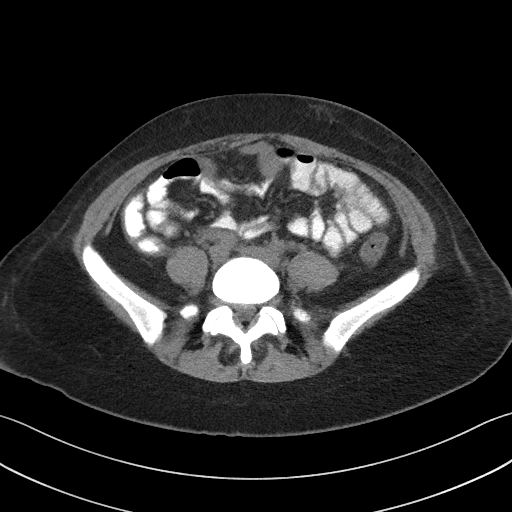
[im 45/100  soft-tissue]
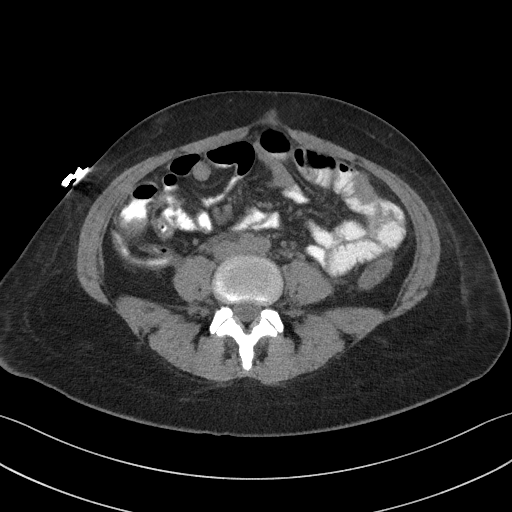
[im 55/100  soft-tissue]
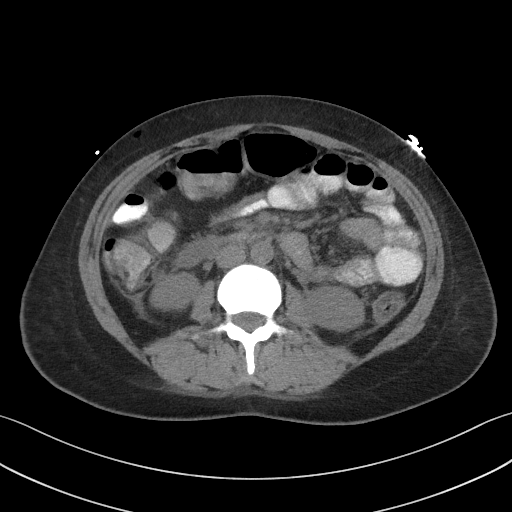
[im 60/100  soft-tissue]
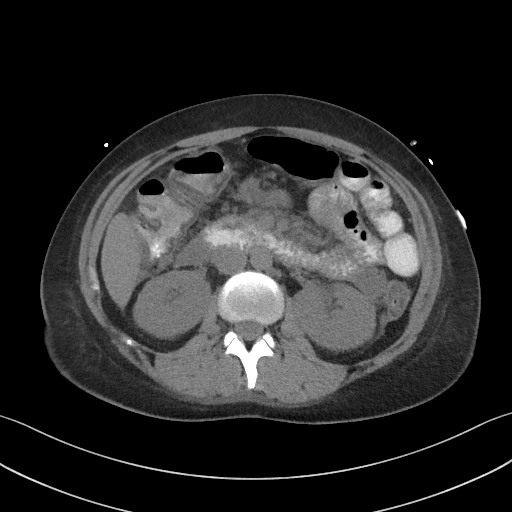
[im 70/100  soft-tissue]
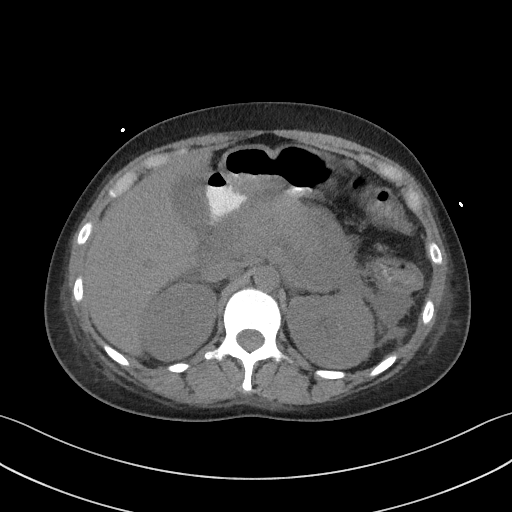
[im 70/100  bone]
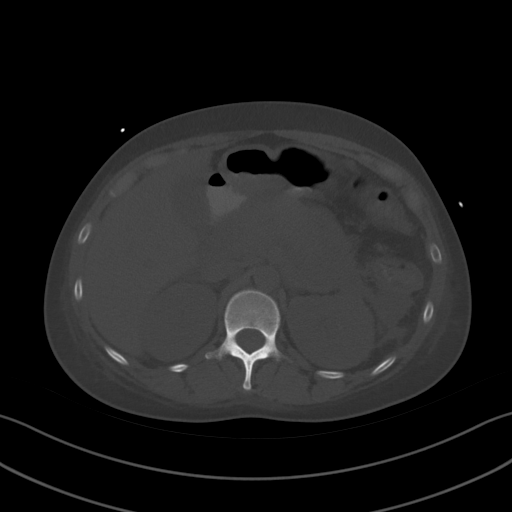
[im 80/100  soft-tissue]
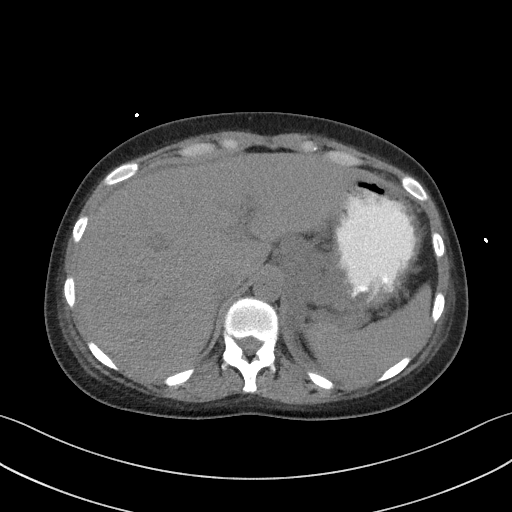
[im 85/100  soft-tissue]
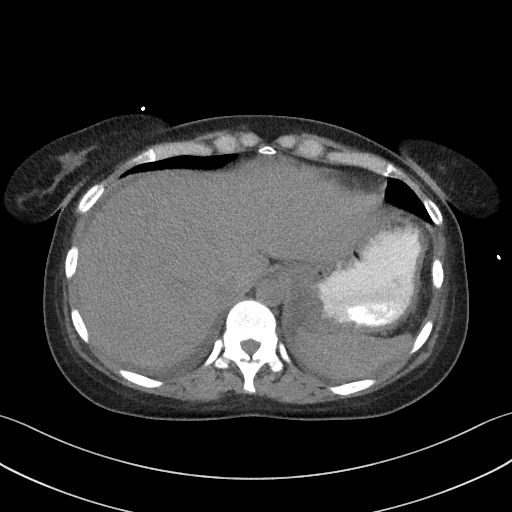
[im 95/100  soft-tissue]
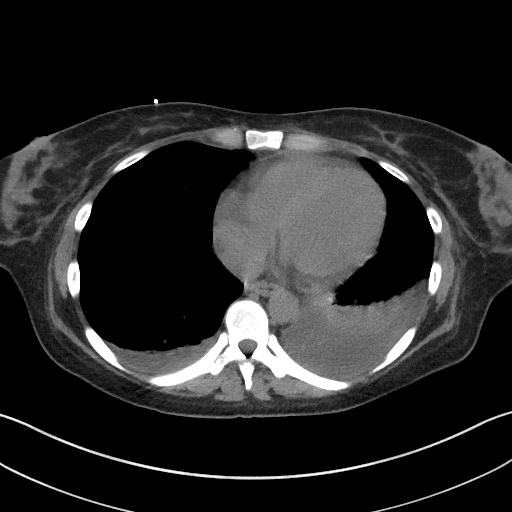

[Series 5: coronal st · coronal · 0.71mm/px · 3 of 81 slices shown]
[im 27/81  soft-tissue]
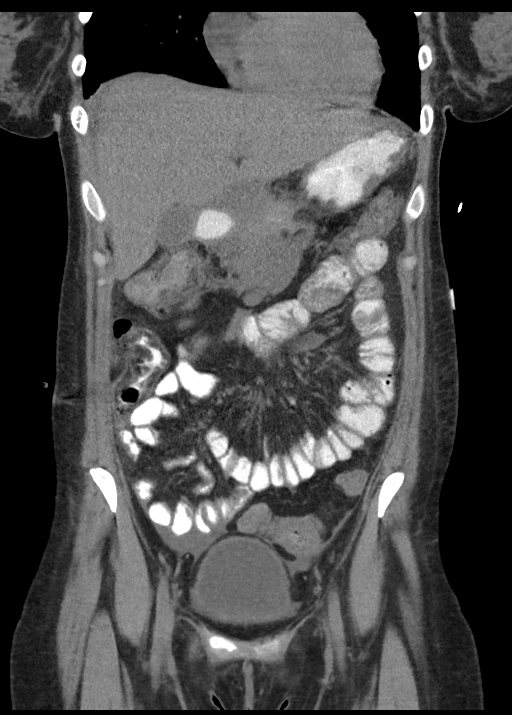
[im 36/81  soft-tissue]
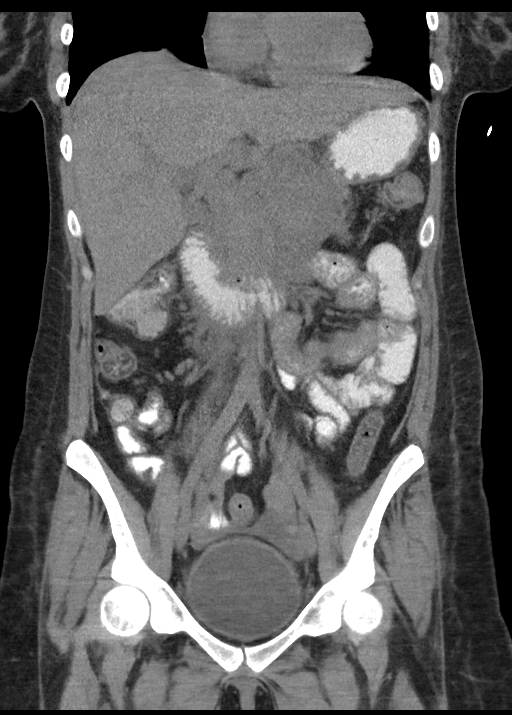
[im 45/81  soft-tissue]
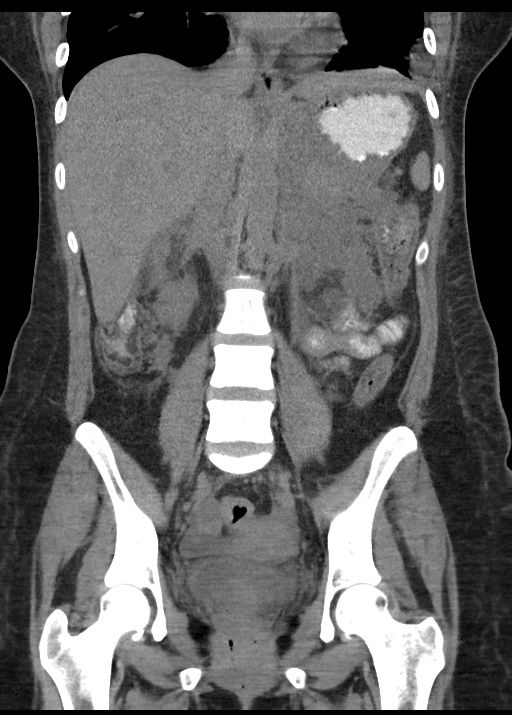

[15 of 46 positions shown; findings below may reference images not displayed]

FINDINGS: Lower chest: Small to moderate bilateral pleural effusions
identified, right greater than left.

Hepatobiliary: No focal liver abnormality is seen. No gallstones,
gallbladder wall thickening, or biliary dilatation.

Pancreas: Persistent and progressive changes of diffuse pancreatitis
noted. Secondary to lack of IV contrast material the pancreas is
difficult to identified separate from surrounding upper abdominal
inflammatory changes. Fat stranding, phlegmon and free fluid in the
upper abdomen appears increased from previous exam.

Spleen: Normal in size without focal abnormality.

Adrenals/Urinary Tract: Normal appearance of the adrenal glands.
Kidneys are unremarkable. Urinary bladder appears normal.

Stomach/Bowel: The stomach is nondistended. Inflammatory changes
associated with pancreatitis have mass effect upon the posterior
wall of the gastric antrum. No significant bowel wall thickening,
inflammation or distension. The appendix is visualized and appears
normal.

Vascular/Lymphatic: Normal appearance of the abdominal aorta. No
abdominopelvic adenopathy identified.

Reproductive: Uterus and bilateral adnexa are unremarkable.

Other: Free fluid is noted extending along the pericolic gutters and
into the pelvis. Within the limitations of unenhanced technique no
discrete fluid collection identified.

Musculoskeletal: No acute or significant osseous findings.
IMPRESSION: 1. Persistent and progressive changes of acute pancreatitis.
Evaluation for pseudocyst or pancreatic necrosis is limited due to
lack of intravenous contrast material.
2. Phlegmon formation/inflammation within the upper abdomen
demonstrates mass effect upon the posterior wall of the gastric
antrum.
3. Small to moderate bilateral pleural effusions, left greater than
right.

## 2021-12-04 ENCOUNTER — Emergency Department (HOSPITAL_COMMUNITY): Payer: Self-pay

## 2021-12-04 ENCOUNTER — Other Ambulatory Visit: Payer: Self-pay

## 2021-12-04 ENCOUNTER — Inpatient Hospital Stay (HOSPITAL_COMMUNITY)
Admission: EM | Admit: 2021-12-04 | Discharge: 2021-12-07 | DRG: 438 | Disposition: A | Discharge status: Home / Self Care | Payer: Self-pay | Attending: Internal Medicine | Admitting: Internal Medicine

## 2021-12-04 ENCOUNTER — Encounter (HOSPITAL_COMMUNITY): Payer: Self-pay

## 2021-12-04 DIAGNOSIS — E1169 Type 2 diabetes mellitus with other specified complication: Secondary | ICD-10-CM | POA: Diagnosis present

## 2021-12-04 DIAGNOSIS — E876 Hypokalemia: Secondary | ICD-10-CM | POA: Diagnosis not present

## 2021-12-04 DIAGNOSIS — Z79899 Other long term (current) drug therapy: Secondary | ICD-10-CM

## 2021-12-04 DIAGNOSIS — Z7984 Long term (current) use of oral hypoglycemic drugs: Secondary | ICD-10-CM

## 2021-12-04 DIAGNOSIS — Z833 Family history of diabetes mellitus: Secondary | ICD-10-CM

## 2021-12-04 DIAGNOSIS — D539 Nutritional anemia, unspecified: Secondary | ICD-10-CM | POA: Diagnosis present

## 2021-12-04 DIAGNOSIS — Z87891 Personal history of nicotine dependence: Secondary | ICD-10-CM

## 2021-12-04 DIAGNOSIS — K859 Acute pancreatitis without necrosis or infection, unspecified: Secondary | ICD-10-CM | POA: Diagnosis present

## 2021-12-04 DIAGNOSIS — Z794 Long term (current) use of insulin: Secondary | ICD-10-CM

## 2021-12-04 DIAGNOSIS — I152 Hypertension secondary to endocrine disorders: Secondary | ICD-10-CM | POA: Diagnosis present

## 2021-12-04 DIAGNOSIS — Z20822 Contact with and (suspected) exposure to covid-19: Secondary | ICD-10-CM | POA: Diagnosis present

## 2021-12-04 DIAGNOSIS — E669 Obesity, unspecified: Secondary | ICD-10-CM | POA: Diagnosis present

## 2021-12-04 DIAGNOSIS — E1110 Type 2 diabetes mellitus with ketoacidosis without coma: Secondary | ICD-10-CM | POA: Diagnosis present

## 2021-12-04 DIAGNOSIS — E871 Hypo-osmolality and hyponatremia: Secondary | ICD-10-CM | POA: Diagnosis present

## 2021-12-04 DIAGNOSIS — F1010 Alcohol abuse, uncomplicated: Secondary | ICD-10-CM | POA: Diagnosis present

## 2021-12-04 DIAGNOSIS — E872 Acidosis, unspecified: Secondary | ICD-10-CM

## 2021-12-04 DIAGNOSIS — I10 Essential (primary) hypertension: Secondary | ICD-10-CM | POA: Diagnosis present

## 2021-12-04 DIAGNOSIS — Z8249 Family history of ischemic heart disease and other diseases of the circulatory system: Secondary | ICD-10-CM

## 2021-12-04 DIAGNOSIS — K760 Fatty (change of) liver, not elsewhere classified: Secondary | ICD-10-CM | POA: Diagnosis present

## 2021-12-04 DIAGNOSIS — E66811 Obesity, class 1: Secondary | ICD-10-CM | POA: Diagnosis present

## 2021-12-04 DIAGNOSIS — Z6831 Body mass index (BMI) 31.0-31.9, adult: Secondary | ICD-10-CM

## 2021-12-04 DIAGNOSIS — E1159 Type 2 diabetes mellitus with other circulatory complications: Secondary | ICD-10-CM | POA: Diagnosis present

## 2021-12-04 DIAGNOSIS — K852 Alcohol induced acute pancreatitis without necrosis or infection: Principal | ICD-10-CM | POA: Diagnosis present

## 2021-12-04 DIAGNOSIS — N179 Acute kidney failure, unspecified: Secondary | ICD-10-CM | POA: Diagnosis present

## 2021-12-04 LAB — CBG MONITORING, ED: Glucose-Capillary: 237 mg/dL — ABNORMAL HIGH (ref 70–99)

## 2021-12-04 LAB — BLOOD GAS, VENOUS
Acid-base deficit: 12.2 mmol/L — ABNORMAL HIGH (ref 0.0–2.0)
Bicarbonate: 13.5 mmol/L — ABNORMAL LOW (ref 20.0–28.0)
O2 Saturation: 86.2 %
Patient temperature: 37
pCO2, Ven: 30 mmHg — ABNORMAL LOW (ref 44–60)
pH, Ven: 7.26 (ref 7.25–7.43)
pO2, Ven: 56 mmHg — ABNORMAL HIGH (ref 32–45)

## 2021-12-04 LAB — RAPID URINE DRUG SCREEN, HOSP PERFORMED
Amphetamines: NOT DETECTED
Barbiturates: NOT DETECTED
Benzodiazepines: NOT DETECTED
Cocaine: NOT DETECTED
Opiates: NOT DETECTED
Tetrahydrocannabinol: NOT DETECTED

## 2021-12-04 LAB — BASIC METABOLIC PANEL
Anion gap: 24 — ABNORMAL HIGH (ref 5–15)
BUN: 19 mg/dL (ref 6–20)
CO2: 10 mmol/L — ABNORMAL LOW (ref 22–32)
Calcium: 9.1 mg/dL (ref 8.9–10.3)
Chloride: 99 mmol/L (ref 98–111)
Creatinine, Ser: 1.3 mg/dL — ABNORMAL HIGH (ref 0.44–1.00)
GFR, Estimated: 56 mL/min — ABNORMAL LOW (ref >60)
Glucose, Bld: 223 mg/dL — ABNORMAL HIGH (ref 70–99)
Potassium: 5.7 mmol/L — ABNORMAL HIGH (ref 3.5–5.1)
Sodium: 133 mmol/L — ABNORMAL LOW (ref 135–145)

## 2021-12-04 LAB — CBC WITH DIFFERENTIAL/PLATELET
Abs Immature Granulocytes: 0.04 10*3/uL (ref 0.00–0.07)
Basophils Absolute: 0.1 10*3/uL (ref 0.0–0.1)
Basophils Relative: 1 %
Eosinophils Absolute: 0 10*3/uL (ref 0.0–0.5)
Eosinophils Relative: 0 %
HCT: 40 % (ref 36.0–46.0)
Hemoglobin: 13.3 g/dL (ref 12.0–15.0)
Immature Granulocytes: 0 %
Lymphocytes Relative: 3 %
Lymphs Abs: 0.3 10*3/uL — ABNORMAL LOW (ref 0.7–4.0)
MCH: 30.8 pg (ref 26.0–34.0)
MCHC: 33.3 g/dL (ref 30.0–36.0)
MCV: 92.6 fL (ref 80.0–100.0)
Monocytes Absolute: 0.5 10*3/uL (ref 0.1–1.0)
Monocytes Relative: 5 %
Neutro Abs: 9.8 10*3/uL — ABNORMAL HIGH (ref 1.7–7.7)
Neutrophils Relative %: 91 %
Platelets: 237 10*3/uL (ref 150–400)
RBC: 4.32 MIL/uL (ref 3.87–5.11)
RDW: 15.1 % (ref 11.5–15.5)
WBC: 10.7 10*3/uL — ABNORMAL HIGH (ref 4.0–10.5)
nRBC: 0 % (ref 0.0–0.2)

## 2021-12-04 LAB — COMPREHENSIVE METABOLIC PANEL
ALT: 32 U/L (ref 0–44)
AST: 71 U/L — ABNORMAL HIGH (ref 15–41)
Albumin: 4.4 g/dL (ref 3.5–5.0)
Alkaline Phosphatase: 72 U/L (ref 38–126)
Anion gap: 25 — ABNORMAL HIGH (ref 5–15)
BUN: 23 mg/dL — ABNORMAL HIGH (ref 6–20)
CO2: 12 mmol/L — ABNORMAL LOW (ref 22–32)
Calcium: 9.3 mg/dL (ref 8.9–10.3)
Chloride: 97 mmol/L — ABNORMAL LOW (ref 98–111)
Creatinine, Ser: 1 mg/dL (ref 0.44–1.00)
GFR, Estimated: >60 mL/min (ref >60)
Glucose, Bld: 161 mg/dL — ABNORMAL HIGH (ref 70–99)
Potassium: 4.3 mmol/L (ref 3.5–5.1)
Sodium: 134 mmol/L — ABNORMAL LOW (ref 135–145)
Total Bilirubin: 1.7 mg/dL — ABNORMAL HIGH (ref 0.3–1.2)
Total Protein: 9.4 g/dL — ABNORMAL HIGH (ref 6.5–8.1)

## 2021-12-04 LAB — ETHANOL: Alcohol, Ethyl (B): 61 mg/dL — ABNORMAL HIGH (ref <10)

## 2021-12-04 LAB — URINALYSIS, ROUTINE W REFLEX MICROSCOPIC
Bilirubin Urine: NEGATIVE
Glucose, UA: 50 mg/dL — AB
Ketones, ur: 80 mg/dL — AB
Nitrite: NEGATIVE
Protein, ur: >=300 mg/dL — AB
RBC / HPF: >50 RBC/hpf — ABNORMAL HIGH (ref 0–5)
Specific Gravity, Urine: 1.014 (ref 1.005–1.030)
pH: 5 (ref 5.0–8.0)

## 2021-12-04 LAB — PROTIME-INR
INR: 1.1 (ref 0.8–1.2)
Prothrombin Time: 13.9 seconds (ref 11.4–15.2)

## 2021-12-04 LAB — LACTIC ACID, PLASMA
Lactic Acid, Venous: 2.4 mmol/L (ref 0.5–1.9)
Lactic Acid, Venous: 1.9 mmol/L (ref 0.5–1.9)

## 2021-12-04 LAB — TSH: TSH: 1.126 u[IU]/mL (ref 0.350–4.500)

## 2021-12-04 LAB — CREATININE, URINE, RANDOM: Creatinine, Urine: 95 mg/dL

## 2021-12-04 LAB — PREGNANCY, URINE: Preg Test, Ur: NEGATIVE

## 2021-12-04 LAB — GLUCOSE, CAPILLARY: Glucose-Capillary: 190 mg/dL — ABNORMAL HIGH (ref 70–99)

## 2021-12-04 LAB — CK: Total CK: 44 U/L (ref 38–234)

## 2021-12-04 LAB — PHOSPHORUS: Phosphorus: 3.3 mg/dL (ref 2.5–4.6)

## 2021-12-04 LAB — BETA-HYDROXYBUTYRIC ACID: Beta-Hydroxybutyric Acid: >8 mmol/L — ABNORMAL HIGH (ref 0.05–0.27)

## 2021-12-04 LAB — SODIUM, URINE, RANDOM: Sodium, Ur: 52 mmol/L

## 2021-12-04 LAB — MAGNESIUM: Magnesium: 2.1 mg/dL (ref 1.7–2.4)

## 2021-12-04 LAB — LIPASE, BLOOD: Lipase: 199 U/L — ABNORMAL HIGH (ref 11–51)

## 2021-12-04 MED ORDER — FOLIC ACID 1 MG PO TABS
1.0000 mg | ORAL_TABLET | Freq: Every day | ORAL | Status: DC
  Administered 2021-12-04: 1 mg via ORAL
  Filled 2021-12-04: qty 1

## 2021-12-04 MED ORDER — THIAMINE HCL 100 MG/ML IJ SOLN
100.0000 mg | Freq: Every day | INTRAMUSCULAR | Status: DC
  Filled 2021-12-04: qty 2

## 2021-12-04 MED ORDER — ONDANSETRON HCL 4 MG/2ML IJ SOLN
4.0000 mg | Freq: Once | INTRAMUSCULAR | Status: AC
  Administered 2021-12-04: 4 mg via INTRAVENOUS
  Filled 2021-12-04: qty 2

## 2021-12-04 MED ORDER — ADULT MULTIVITAMIN W/MINERALS CH
1.0000 | ORAL_TABLET | Freq: Every day | ORAL | Status: DC
  Administered 2021-12-04 – 2021-12-07 (×4): 1 via ORAL
  Filled 2021-12-04 (×4): qty 1

## 2021-12-04 MED ORDER — DEXTROSE-NACL 5-0.45 % IV SOLN: INTRAVENOUS | Status: DC

## 2021-12-04 MED ORDER — HYDROMORPHONE HCL 1 MG/ML IJ SOLN
1.0000 mg | Freq: Once | INTRAMUSCULAR | Status: AC
  Administered 2021-12-04: 1 mg via INTRAVENOUS
  Filled 2021-12-04: qty 1

## 2021-12-04 MED ORDER — THIAMINE HCL 100 MG/ML IJ SOLN
100.0000 mg | Freq: Once | INTRAMUSCULAR | Status: AC
  Administered 2021-12-04: 100 mg via INTRAVENOUS
  Filled 2021-12-04: qty 2

## 2021-12-04 MED ORDER — IOHEXOL 350 MG/ML SOLN
100.0000 mL | Freq: Once | INTRAVENOUS | Status: AC | PRN
  Administered 2021-12-04: 100 mL via INTRAVENOUS

## 2021-12-04 MED ORDER — LACTATED RINGERS IV BOLUS
2000.0000 mL | Freq: Once | INTRAVENOUS | Status: AC
  Administered 2021-12-04: 2000 mL via INTRAVENOUS

## 2021-12-04 MED ORDER — HYDROCODONE-ACETAMINOPHEN 5-325 MG PO TABS
1.0000 | ORAL_TABLET | ORAL | Status: DC | PRN
  Administered 2021-12-05 – 2021-12-07 (×5): 2 via ORAL
  Filled 2021-12-04 (×5): qty 2

## 2021-12-04 MED ORDER — HYDROMORPHONE HCL 1 MG/ML IJ SOLN
0.5000 mg | INTRAMUSCULAR | Status: DC | PRN
  Administered 2021-12-04: 0.5 mg via INTRAVENOUS
  Filled 2021-12-04: qty 0.5
  Filled 2021-12-04: qty 1

## 2021-12-04 MED ORDER — ONDANSETRON 4 MG PO TBDP
4.0000 mg | ORAL_TABLET | Freq: Once | ORAL | Status: AC | PRN
  Administered 2021-12-04: 4 mg via ORAL
  Filled 2021-12-04: qty 1

## 2021-12-04 MED ORDER — METOCLOPRAMIDE HCL 5 MG/ML IJ SOLN
10.0000 mg | Freq: Once | INTRAMUSCULAR | Status: AC
  Administered 2021-12-04: 10 mg via INTRAVENOUS
  Filled 2021-12-04: qty 2

## 2021-12-04 MED ORDER — HYDROMORPHONE HCL 1 MG/ML IJ SOLN
0.5000 mg | Freq: Once | INTRAMUSCULAR | Status: AC
  Administered 2021-12-04: 0.5 mg via INTRAVENOUS

## 2021-12-04 MED ORDER — DEXTROSE 50 % IV SOLN: 0.0000 mL | INTRAVENOUS | Status: DC | PRN

## 2021-12-04 MED ORDER — SODIUM CHLORIDE 0.9 % IV BOLUS: 1000.0000 mL | Freq: Once | INTRAVENOUS | Status: DC

## 2021-12-04 MED ORDER — LORAZEPAM 2 MG/ML IJ SOLN: 1.0000 mg | INTRAMUSCULAR | Status: DC | PRN

## 2021-12-04 MED ORDER — CHLORHEXIDINE GLUCONATE CLOTH 2 % EX PADS
6.0000 | MEDICATED_PAD | Freq: Every day | CUTANEOUS | Status: DC
  Administered 2021-12-04 – 2021-12-05 (×2): 6 via TOPICAL

## 2021-12-04 MED ORDER — ORAL CARE MOUTH RINSE: 15.0000 mL | OROMUCOSAL | Status: DC | PRN

## 2021-12-04 MED ORDER — LORAZEPAM 1 MG PO TABS: 1.0000 mg | ORAL_TABLET | ORAL | Status: DC | PRN

## 2021-12-04 MED ORDER — INSULIN REGULAR(HUMAN) IN NACL 100-0.9 UT/100ML-% IV SOLN
INTRAVENOUS | Status: DC
  Administered 2021-12-05: 7.5 [IU]/h via INTRAVENOUS
  Filled 2021-12-04: qty 100

## 2021-12-04 MED ORDER — DEXTROSE IN LACTATED RINGERS 5 % IV SOLN: INTRAVENOUS | Status: DC

## 2021-12-04 MED ORDER — LACTATED RINGERS IV SOLN: INTRAVENOUS | Status: DC

## 2021-12-04 MED ORDER — INSULIN ASPART 100 UNIT/ML IJ SOLN
0.0000 [IU] | INTRAMUSCULAR | Status: DC
  Administered 2021-12-04: 4 [IU] via SUBCUTANEOUS
  Filled 2021-12-04: qty 0.09

## 2021-12-04 MED ORDER — ACETAMINOPHEN 325 MG PO TABS: 650.0000 mg | ORAL_TABLET | Freq: Four times a day (QID) | ORAL | Status: DC | PRN

## 2021-12-04 MED ORDER — LIP MEDEX EX OINT
1.0000 | TOPICAL_OINTMENT | CUTANEOUS | Status: DC | PRN
  Administered 2021-12-05: 1 via TOPICAL
  Filled 2021-12-04: qty 7

## 2021-12-04 MED ORDER — ACETAMINOPHEN 650 MG RE SUPP: 650.0000 mg | Freq: Four times a day (QID) | RECTAL | Status: DC | PRN

## 2021-12-04 MED ORDER — THIAMINE MONONITRATE 100 MG PO TABS
100.0000 mg | ORAL_TABLET | Freq: Every day | ORAL | Status: DC
  Administered 2021-12-04 – 2021-12-07 (×4): 100 mg via ORAL
  Filled 2021-12-04 (×4): qty 1

## 2021-12-04 MED ORDER — ONDANSETRON HCL 4 MG/2ML IJ SOLN
4.0000 mg | Freq: Four times a day (QID) | INTRAMUSCULAR | Status: DC | PRN
  Administered 2021-12-04: 4 mg via INTRAVENOUS
  Filled 2021-12-04: qty 2

## 2021-12-04 NOTE — Assessment & Plan Note (Signed)
Allow permissive hypertension for tonight 

## 2021-12-04 NOTE — Subjective & Objective (Signed)
Patient with history of alcohol abuse and pancreatitis comes in with nausea vomiting some diarrhea and generalized fatigue for the past 2 days Try to drink alcohol yesterday but could not tolerate.  Had significant abdominal pain which is periumbilical she usually drinks about 3 days a per week.

## 2021-12-04 NOTE — ED Notes (Signed)
Pt requesting something for nausea. Will admin zofran

## 2021-12-04 NOTE — ED Provider Notes (Signed)
[facility] DEPT Provider Note   CSN: [id number] Arrival date & time: 12/04/21  1007     History  Chief Complaint  Patient presents with   Abdominal Pain    Yolanda Flores is a 36 y.o. female.   Abdominal Pain Associated symptoms: diarrhea, dysuria, fatigue, nausea, shortness of breath and vomiting   Patient presents for abdominal pain, nausea, and vomiting.  Medical history includes HTN, alcohol use, pancreatitis, DM2.  For the past 3 days, she has had periumbilical abdominal pain.  She has had persistent nausea and vomiting.  She has had p.o. intolerance.  She does continue to drink alcohol.  She estimates that she drinks 3 days of the week.  She did try to drink alcohol yesterday but is unable to tolerate.  She does not take any medications at home for nausea.  She states that she started her menstrual cycle today.  Currently, pain is severe and she does have ongoing nausea despite dose of Zofran in the waiting room.     Home Medications Prior to Admission medications   Medication Sig Start Date End Date Taking? Authorizing Provider  insulin glargine (LANTUS SOLOSTAR) 100 UNIT/ML Solostar Pen Inject 10 Units into the skin at bedtime. 05/13/21   Ladell Pier, MD  lisinopril (ZESTRIL) 2.5 MG tablet TAKE 1 TABLET(2.5 MG) BY MOUTH DAILY 09/13/21   Ladell Pier, MD  metFORMIN (GLUCOPHAGE) 1000 MG tablet Take 1 tablet (1,000 mg total) by mouth 2 (two) times daily with a meal. 02/26/21   Ladell Pier, MD  Multiple Vitamin (MULTIVITAMIN WITH MINERALS) TABS tablet Take 1 tablet by mouth daily. 01/18/19   Hosie Poisson, MD      Allergies    Ibuprofen    Review of Systems   Review of Systems  Constitutional:  Positive for activity change, appetite change and fatigue.  Respiratory:  Positive for shortness of breath.   Gastrointestinal:  Positive for abdominal pain, diarrhea, nausea and vomiting.  Genitourinary:  Positive for dysuria.  All  other systems reviewed and are negative.   Physical Exam Updated Vital Signs BP (!) 150/92   Pulse (!) 115   Temp (!) 97.5 F (36.4 C) (Oral)   Resp (!) 26   Ht '5\' 7"'$  (1.702 m)   Wt 97 kg   SpO2 98%   BMI 33.49 kg/m  Physical Exam Vitals and nursing note reviewed.  Constitutional:      General: She is not in acute distress.    Appearance: She is well-developed. She is ill-appearing. She is not toxic-appearing or diaphoretic.  HENT:     Head: Normocephalic and atraumatic.     Mouth/Throat:     Mouth: Mucous membranes are moist.     Pharynx: Oropharynx is clear.  Eyes:     General: No scleral icterus.    Conjunctiva/sclera: Conjunctivae normal.  Cardiovascular:     Rate and Rhythm: Normal rate and regular rhythm.     Heart sounds: No murmur heard. Pulmonary:     Effort: Pulmonary effort is normal. No respiratory distress.     Breath sounds: Normal breath sounds. No wheezing or rales.  Abdominal:     General: There is no distension.     Palpations: Abdomen is soft.     Tenderness: There is abdominal tenderness in the periumbilical area. There is no right CVA tenderness, left CVA tenderness, guarding or rebound.  Musculoskeletal:        General: No swelling.  Cervical back: Neck supple.  Skin:    General: Skin is warm and dry.     Coloration: Skin is not cyanotic, jaundiced or pale.  Neurological:     General: No focal deficit present.     Mental Status: She is alert and oriented to person, place, and time.     Cranial Nerves: No cranial nerve deficit.     Motor: No weakness.  Psychiatric:        Mood and Affect: Mood normal.        Behavior: Behavior normal.     ED Results / Procedures / Treatments   Labs (all labs ordered are listed, but only abnormal results are displayed) Labs Reviewed  CBC WITH DIFFERENTIAL/PLATELET - Abnormal; Notable for the following components:      Result Value   WBC 10.7 (*)    Neutro Abs 9.8 (*)    Lymphs Abs 0.3 (*)    All  other components within normal limits  COMPREHENSIVE METABOLIC PANEL - Abnormal; Notable for the following components:   Sodium 134 (*)    Chloride 97 (*)    CO2 12 (*)    Glucose, Bld 161 (*)    BUN 23 (*)    Total Protein 9.4 (*)    AST 71 (*)    Total Bilirubin 1.7 (*)    Anion gap 25 (*)    All other components within normal limits  LIPASE, BLOOD - Abnormal; Notable for the following components:   Lipase 199 (*)    All other components within normal limits  URINALYSIS, ROUTINE W REFLEX MICROSCOPIC - Abnormal; Notable for the following components:   APPearance HAZY (*)    Glucose, UA 50 (*)    Hgb urine dipstick LARGE (*)    Ketones, ur 80 (*)    Protein, ur >=300 (*)    Leukocytes,Ua TRACE (*)    RBC / HPF >50 (*)    Bacteria, UA RARE (*)    All other components within normal limits  PREGNANCY, URINE  BETA-HYDROXYBUTYRIC ACID  BLOOD GAS, VENOUS  I-STAT BETA HCG BLOOD, ED ([facility], [facility], [facility] ONLY)    EKG EKG Interpretation  Date/Time:  Wednesday December 04 2021 14:31:32 EST Ventricular Rate:  123 PR Interval:  152 QRS Duration: 84 QT Interval:  318 QTC Calculation: 455 R Axis:   -45 Text Interpretation: Sinus tachycardia Probable left atrial enlargement Confirmed by Godfrey Pick [phone]) on 12/04/2021 2:41:39 PM  Radiology CT ABDOMEN PELVIS W CONTRAST  Result Date: 12/04/2021 CLINICAL DATA:  Abdominal pain and weakness. EXAM: CT ANGIOGRAPHY CHEST CT ABDOMEN AND PELVIS WITH CONTRAST TECHNIQUE: Multidetector CT imaging of the chest was performed using the standard protocol during bolus administration of intravenous contrast. Multiplanar CT image reconstructions and MIPs were obtained to evaluate the vascular anatomy. Multidetector CT imaging of the abdomen and pelvis was performed using the standard protocol during bolus administration of intravenous contrast. RADIATION DOSE REDUCTION: This exam was performed according to the departmental dose-optimization program which includes  automated exposure control, adjustment of the mA and/or kV according to patient size and/or use of iterative reconstruction technique. CONTRAST:  174m OMNIPAQUE IOHEXOL 350 MG/ML SOLN COMPARISON:  CT abdomen and pelvis 12/24/2019. CT angiogram chest 05/24/2018. FINDINGS: CTA CHEST FINDINGS Cardiovascular: Satisfactory opacification of the pulmonary arteries to the segmental level. No evidence of pulmonary embolism. Normal heart size. No pericardial effusion. Mediastinum/Nodes: No enlarged mediastinal, hilar, or axillary lymph nodes. Thyroid gland, trachea, and esophagus demonstrate no significant findings. Lungs/Pleura:  Lungs are clear. No pleural effusion or pneumothorax. Musculoskeletal: No chest wall abnormality. No acute or significant osseous findings. Review of the MIP images confirms the above findings. CT ABDOMEN and PELVIS FINDINGS Hepatobiliary: Liver is enlarged. There is diffuse fatty infiltration as seen on prior examination. Gallbladder and bile ducts are within normal limits. Pancreas: There is a moderate amount of peripancreatic fluid and stranding diffusely. Pancreas enhances normally. Within the head of the pancreas there is a heterogeneous hypodense area measuring 1.7 x 1.3 cm, indeterminate. This is ill-defined. Spleen: Normal in size without focal abnormality. Adrenals/Urinary Tract: Adrenal glands are unremarkable. Kidneys are normal, without renal calculi, focal lesion, or hydronephrosis. Bladder is unremarkable. Stomach/Bowel: Stomach is within normal limits. Appendix appears normal. No evidence of bowel wall thickening, distention, or inflammatory changes. There is submucosal fat deposition throughout the colon as seen on the prior study. Vascular/Lymphatic: No significant vascular findings are present. No enlarged abdominal or pelvic lymph nodes. Reproductive: Uterus and bilateral adnexa are unremarkable. Other: Small fat containing umbilical hernia. No free fluid or free air.  Musculoskeletal: No acute or significant osseous findings. Review of the MIP images confirms the above findings. IMPRESSION: 1. No evidence for pulmonary embolism or other acute cardiopulmonary process. 2. Findings compatible with acute pancreatitis. There is a heterogeneous hypodense area in the head of the pancreas measuring 1.7 cm which is indeterminate. This may represent developing pseudocyst or focal pancreatitis, but underlying lesion is not excluded. 3. Hepatomegaly with fatty infiltration of the liver. 4. Submucosal fat deposition throughout the colon is likely related to chronic inflammation. Electronically Signed   By: Ronney Asters M.D.   On: 12/04/2021 17:33   CT Angio Chest PE W and/or Wo Contrast  Result Date: 12/04/2021 CLINICAL DATA:  Abdominal pain and weakness. EXAM: CT ANGIOGRAPHY CHEST CT ABDOMEN AND PELVIS WITH CONTRAST TECHNIQUE: Multidetector CT imaging of the chest was performed using the standard protocol during bolus administration of intravenous contrast. Multiplanar CT image reconstructions and MIPs were obtained to evaluate the vascular anatomy. Multidetector CT imaging of the abdomen and pelvis was performed using the standard protocol during bolus administration of intravenous contrast. RADIATION DOSE REDUCTION: This exam was performed according to the departmental dose-optimization program which includes automated exposure control, adjustment of the mA and/or kV according to patient size and/or use of iterative reconstruction technique. CONTRAST:  143m OMNIPAQUE IOHEXOL 350 MG/ML SOLN COMPARISON:  CT abdomen and pelvis 12/24/2019. CT angiogram chest 05/24/2018. FINDINGS: CTA CHEST FINDINGS Cardiovascular: Satisfactory opacification of the pulmonary arteries to the segmental level. No evidence of pulmonary embolism. Normal heart size. No pericardial effusion. Mediastinum/Nodes: No enlarged mediastinal, hilar, or axillary lymph nodes. Thyroid gland, trachea, and esophagus  demonstrate no significant findings. Lungs/Pleura: Lungs are clear. No pleural effusion or pneumothorax. Musculoskeletal: No chest wall abnormality. No acute or significant osseous findings. Review of the MIP images confirms the above findings. CT ABDOMEN and PELVIS FINDINGS Hepatobiliary: Liver is enlarged. There is diffuse fatty infiltration as seen on prior examination. Gallbladder and bile ducts are within normal limits. Pancreas: There is a moderate amount of peripancreatic fluid and stranding diffusely. Pancreas enhances normally. Within the head of the pancreas there is a heterogeneous hypodense area measuring 1.7 x 1.3 cm, indeterminate. This is ill-defined. Spleen: Normal in size without focal abnormality. Adrenals/Urinary Tract: Adrenal glands are unremarkable. Kidneys are normal, without renal calculi, focal lesion, or hydronephrosis. Bladder is unremarkable. Stomach/Bowel: Stomach is within normal limits. Appendix appears normal. No evidence of bowel wall thickening, distention,  or inflammatory changes. There is submucosal fat deposition throughout the colon as seen on the prior study. Vascular/Lymphatic: No significant vascular findings are present. No enlarged abdominal or pelvic lymph nodes. Reproductive: Uterus and bilateral adnexa are unremarkable. Other: Small fat containing umbilical hernia. No free fluid or free air. Musculoskeletal: No acute or significant osseous findings. Review of the MIP images confirms the above findings. IMPRESSION: 1. No evidence for pulmonary embolism or other acute cardiopulmonary process. 2. Findings compatible with acute pancreatitis. There is a heterogeneous hypodense area in the head of the pancreas measuring 1.7 cm which is indeterminate. This may represent developing pseudocyst or focal pancreatitis, but underlying lesion is not excluded. 3. Hepatomegaly with fatty infiltration of the liver. 4. Submucosal fat deposition throughout the colon is likely related to  chronic inflammation. Electronically Signed   By: Ronney Asters M.D.   On: 12/04/2021 17:33    Procedures Procedures    Medications Ordered in ED Medications  HYDROmorphone (DILAUDID) injection 1 mg (has no administration in time range)  lactated ringers infusion (has no administration in time range)  ondansetron (ZOFRAN-ODT) disintegrating tablet 4 mg (4 mg Oral Given 12/04/21 1120)  lactated ringers bolus 2,000 mL (2,000 mLs Intravenous New Bag/Given 12/04/21 1218)  HYDROmorphone (DILAUDID) injection 1 mg (1 mg Intravenous Given 12/04/21 1217)  metoCLOPramide (REGLAN) injection 10 mg (10 mg Intravenous Given 12/04/21 1218)  HYDROmorphone (DILAUDID) injection 1 mg (1 mg Intravenous Given 12/04/21 1657)  ondansetron (ZOFRAN) injection 4 mg (4 mg Intravenous Given 12/04/21 1656)  iohexol (OMNIPAQUE) 350 MG/ML injection 100 mL (100 mLs Intravenous Contrast Given 12/04/21 1703)    ED Course/ Medical Decision Making/ A&P                           Medical Decision Making Amount and/or Complexity of Data Reviewed Labs: ordered. Radiology: ordered.  Risk Prescription drug management.   This patient presents to the ED for concern of abdominal pain, nausea, and vomiting, this involves an extensive number of treatment options, and is a complaint that carries with it a high risk of complications and morbidity.  The differential diagnosis includes pancreatitis, PUD, enteritis, GERD, cyclic vomiting, dehydration, DKA   Co morbidities that complicate the patient evaluation  HTN, DM, alcohol abuse   Additional history obtained:  Additional history obtained from N/A External records from outside source obtained and reviewed including EMR   Lab Tests:  I Ordered, and personally interpreted labs.  The pertinent results include: Elevated lipase, metabolic acidosis with anion gap, mild leukocytosis   Imaging Studies ordered:  I ordered imaging studies including CT of abdomen pelvis I  independently visualized and interpreted imaging which showed findings consistent with acute pancreatitis I agree with the radiologist interpretation   Cardiac Monitoring: / EKG:  The patient was maintained on a cardiac monitor.  I personally viewed and interpreted the cardiac monitored which showed an underlying rhythm of: Sinus rhythm   Problem List / ED Course / Critical interventions / Medication management  Patient returns for abdominal pain, nausea, vomiting for the past 3 days.  Vital signs on arrival are notable for tachycardia and hypertension.  Prior to being bedded in the ED, diagnostic workup was initiated.  Initial lab work shows mild elevations in WBC and lipase.  Patient is found to have anion gap metabolic acidosis.  Blood glucose is in normal range.  On assessment, patient appears uncomfortable.  She does have generalized abdominal tenderness, greater in  the epigastric region.  She does endorse continued nausea despite dose of Zofran.  Dilaudid and Reglan ordered for symptomatic relief.  2 L of IV fluid ordered for dehydration.  Additional lab work was ordered.  CT imaging was ordered.  On reassessment, patient endorses continued severe pain and only mildly improved nausea.  Additional Dilaudid and Zofran were ordered.  On CT imaging, findings are consistent with acute pancreatitis.  Following 2 doses of Dilaudid, patient continues to endorse 7/10 severity abdominal pain.  She does have improved nausea and is able to drink water.  Given her uncontrolled pain, patient was admitted to hospitalist for further management. 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

## 2021-12-04 NOTE — Assessment & Plan Note (Addendum)
Rehydrate Check lactic acid Check beta hydroxybutyric acid presents Patient does have a history of type 2 diabetes no evidence of hyperglycemia Could have euglycemic ketoacidosis in the setting of decreased p.o. intake secondary to nausea vomiting vs early DKA Add D5 1/2 NS and follow acidosis

## 2021-12-04 NOTE — ED Provider Triage Note (Signed)
Emergency Medicine Provider Triage Evaluation Note  Yolanda Flores , a 36 y.o. female  was evaluated in triage.  Pt complains of vomiting and abdominal pain Pt is diabetic.  Pt vomiting for 3 dasy   Review of Systems  Positive: Abdominal pain Negative: fever  Physical Exam  BP (!) 138/103   Pulse (!) 129   Temp 98.3 F (36.8 C) (Oral)   Resp 18   Ht '5\' 7"'$  (1.702 m)   Wt 97 kg   SpO2 100%   BMI 33.49 kg/m  Gen:   Awake, uncomfortable  Resp:  Normal effort  MSK:   Moves extremities without difficulty  Other:    Medical Decision Making  Medically screening exam initiated at 10:48 AM.  Appropriate orders placed.  SEBRENA ENGH was informed that the remainder of the evaluation will be completed by another provider, this initial triage assessment does not replace that evaluation, and the importance of remaining in the ED until their evaluation is complete.     Fransico Meadow, [place] 12/04/21 1049

## 2021-12-04 NOTE — Assessment & Plan Note (Signed)
most likely cause being alcohol induced,   CT of abdomen showing acute pancreatitis early pseudocyst Will rehydrate with aggressive IV fluids Keep n.p.o. Follow clinically  -  strongly advised patient to stop drinking alcohol -Check lipid panel Control pain with IV pain medications If no significant improvement in the next 24 hours may benefit from GI consult

## 2021-12-04 NOTE — H&P (Signed)
DOHA BOLING DXA:[id number] DOB: 1985-04-02 DOA: 12/04/2021     PCP: Ladell Pier, MD   Outpatient Specialists:  NONE    Patient arrived to ER on 12/04/21 at 1007 Referred by Attending Toy Baker, MD   Patient coming from:    home Lives  w  SO Chief Complaint:   Chief Complaint  Patient presents with   Abdominal Pain    HPI: Yolanda Flores is a 36 y.o. female with medical history significant of DM2, etoh Abuse, HTN   Presented with  abdominal pain Patient with history of alcohol abuse and pancreatitis comes in with nausea vomiting some diarrhea and generalized fatigue for the past 2 days Try to drink alcohol yesterday but could not tolerate.  Had significant abdominal pain which is periumbilical she usually drinks about 3 days a per week. Drinks less than before still a few drinks a day   Has not had her meds for the past few weeks   Regarding pertinent Chronic problems:       HTN on lisinopril      DM 2 -  Lab Results  Component Value Date   HGBA1C 7.3 (H) 04/12/2021   on insulin, PO meds only, diet controlled      obesity-   BMI Readings from Last 1 Encounters:  12/04/21 33.49 kg/m       While in ER:   Found to have evidence of pancreatitis lipase is only 199 but given abdominal pain and CT scan findings were treated with pancreatitis with pain medication still continues to have pain and needs to be admission for pain control   CTabd/pelvis - acute pancreatitis.   CTA chest -  nonacute, no PE,   no evidence of infiltrate  Following Medications were ordered in ER: Medications  lactated ringers infusion ( Intravenous New Bag/Given 12/04/21 1852)  insulin aspart (novoLOG) injection 0-9 Units (has no administration in time range)  ondansetron (ZOFRAN-ODT) disintegrating tablet 4 mg (4 mg Oral Given 12/04/21 1120)  lactated ringers bolus 2,000 mL (0 mLs Intravenous Stopped 12/04/21 1848)  HYDROmorphone (DILAUDID) injection 1 mg (1 mg  Intravenous Given 12/04/21 1217)  metoCLOPramide (REGLAN) injection 10 mg (10 mg Intravenous Given 12/04/21 1218)  HYDROmorphone (DILAUDID) injection 1 mg (1 mg Intravenous Given 12/04/21 1657)  ondansetron (ZOFRAN) injection 4 mg (4 mg Intravenous Given 12/04/21 1656)  iohexol (OMNIPAQUE) 350 MG/ML injection 100 mL (100 mLs Intravenous Contrast Given 12/04/21 1703)  HYDROmorphone (DILAUDID) injection 1 mg (1 mg Intravenous Given 12/04/21 1851)       ED Triage Vitals  Enc Vitals Group     BP 12/04/21 1023 (!) 138/103     Pulse Rate 12/04/21 1023 (!) 129     Resp 12/04/21 1023 18     Temp 12/04/21 1023 98.3 F (36.8 C)     Temp Source 12/04/21 1023 Oral     SpO2 12/04/21 1023 100 %     Weight 12/04/21 1028 213 lb 13.5 oz (97 kg)     Height 12/04/21 1028 '5\' 7"'$  (1.702 m)     Head Circumference --      Peak Flow --      Pain Score 12/04/21 1028 8     Pain Loc --      Pain Edu? --      Excl. in [place]? --   TMAX(24)@     _________________________________________ Significant initial  Findings: Abnormal Labs Reviewed  CBC WITH DIFFERENTIAL/PLATELET - Abnormal; Notable for the  following components:      Result Value   WBC 10.7 (*)    Neutro Abs 9.8 (*)    Lymphs Abs 0.3 (*)    All other components within normal limits  COMPREHENSIVE METABOLIC PANEL - Abnormal; Notable for the following components:   Sodium 134 (*)    Chloride 97 (*)    CO2 12 (*)    Glucose, Bld 161 (*)    BUN 23 (*)    Total Protein 9.4 (*)    AST 71 (*)    Total Bilirubin 1.7 (*)    Anion gap 25 (*)    All other components within normal limits  LIPASE, BLOOD - Abnormal; Notable for the following components:   Lipase 199 (*)    All other components within normal limits  URINALYSIS, ROUTINE W REFLEX MICROSCOPIC - Abnormal; Notable for the following components:   APPearance HAZY (*)    Glucose, UA 50 (*)    Hgb urine dipstick LARGE (*)    Ketones, ur 80 (*)    Protein, ur >=300 (*)    Leukocytes,Ua TRACE (*)     RBC / HPF >50 (*)    Bacteria, UA RARE (*)    All other components within normal limits     ECG: Ordered Personally reviewed and interpreted by me showing: HR : 123 Rhythm: Sinus tachycardia Probable left atrial enlargement Left anterior fascicular block Low voltage, precordial leads Probable anteroseptal infarct, old Nonspecific T abnormalities, lateral leads  QTC 455    The recent clinical data is shown below. Vitals:   12/04/21 1400 12/04/21 1500 12/04/21 1515 12/04/21 1547  BP: (!) 155/106 (!) 160/103 (!) 150/92   Pulse: (!) 122 (!) 118 (!) 115   Resp: (!) 27 (!) 27 (!) 26   Temp:    (!) 97.5 F (36.4 C)  TempSrc:    Oral  SpO2: 98% 98% 98%   Weight:      Height:        WBC     Component Value Date/Time   WBC 10.7 (H) 12/04/2021 1104   LYMPHSABS 0.3 (L) 12/04/2021 1104   MONOABS 0.5 12/04/2021 1104   EOSABS 0.0 12/04/2021 1104   BASOSABS 0.1 12/04/2021 1104       UA   no evidence of UTI concentrated elevated ketones   Urine analysis:    Component Value Date/Time   COLORURINE YELLOW 12/04/2021 1208   APPEARANCEUR HAZY (A) 12/04/2021 1208   LABSPEC 1.014 12/04/2021 1208   PHURINE 5.0 12/04/2021 1208   GLUCOSEU 50 (A) 12/04/2021 1208   HGBUR LARGE (A) 12/04/2021 1208   BILIRUBINUR NEGATIVE 12/04/2021 1208   KETONESUR 80 (A) 12/04/2021 1208   PROTEINUR >=300 (A) 12/04/2021 1208   UROBILINOGEN 2.0 (H) 12/31/2009 1757   NITRITE NEGATIVE 12/04/2021 1208   LEUKOCYTESUR TRACE (A) 12/04/2021 1208    _______________________________________________ Hospitalist was called for admission for   Alcohol-induced acute pancreatitis without infection or necrosis    The following Work up has been ordered so far:  Orders Placed This Encounter  Procedures   CT ABDOMEN PELVIS W CONTRAST   CT Angio Chest PE W and/or Wo Contrast   CBC with Differential   Comprehensive metabolic panel   Lipase, blood   Urinalysis, Routine w reflex microscopic Urine, Clean Catch    Beta-hydroxybutyric acid   Blood gas, venous (at [facility] and [facility], not at [facility])   Pregnancy, urine   CK   Magnesium   Phosphorus  Prealbumin   Protime-INR   TSH   Osmolality   Osmolality, urine   Creatinine, urine, random   Sodium, urine, random   Ethanol   Rapid urine drug screen (hospital performed)   Hemoglobin A1c   Lactic acid, plasma   Lipid panel   Apply Diabetes Mellitus Care Plan   STAT CBG when hypoglycemia is suspected. If treated, recheck every 15 minutes after each treatment until CBG >/= 70 mg/dl   Refer to Hypoglycemia Protocol Sidebar Report for treatment of CBG < 70 mg/dl   Cardiac monitoring   Consult to hospitalist   I-Stat Beta hCG blood, ED ([facility], [facility], [facility] only)   ED EKG   EKG 12-Lead   Place in observation (patient's expected length of stay will be less than 2 midnights)     OTHER Significant initial  Findings:  labs showing:    Recent Labs  Lab 12/04/21 1104  NA 134*  K 4.3  CO2 12*  GLUCOSE 161*  BUN 23*  CREATININE 1.00  CALCIUM 9.3    Cr   Up from baseline see below Lab Results  Component Value Date   CREATININE 1.00 12/04/2021   CREATININE 0.81 12/09/2020   CREATININE 0.55 12/28/2019    Recent Labs  Lab 12/04/21 1104  AST 71*  ALT 32  ALKPHOS 72  BILITOT 1.7*  PROT 9.4*  ALBUMIN 4.4   Lab Results  Component Value Date   CALCIUM 9.3 12/04/2021   PHOS 2.0 (L) 12/28/2019    Plt: Lab Results  Component Value Date   PLT 237 12/04/2021       Recent Labs  Lab 12/04/21 1104  WBC 10.7*  NEUTROABS 9.8*  HGB 13.3  HCT 40.0  MCV 92.6  PLT 237    HG/HCT  stable,       Component Value Date/Time   HGB 13.3 12/04/2021 1104   HCT 40.0 12/04/2021 1104   MCV 92.6 12/04/2021 1104      Recent Labs  Lab 12/04/21 1104  LIPASE 199*     Cardiac Panel (last 3 results) No results for input(s): "CKTOTAL", "CKMB", "TROPONINI", "RELINDX" in the last 72 hours.   HbA1C: Recent Labs    04/12/21 1037  HGBA1C 7.3*       CBG  (last 3)     Recent Labs    12/04/21 1937  GLUCAP 237*        Cultures:    Component Value Date/Time   SDES  12/24/2019 1730    BLOOD BLOOD RIGHT FOREARM Performed at [facility], [place] [address], [place], [place] [address]    SDES  12/24/2019 1730    BLOOD RIGHT ANTECUBITAL Performed at [facility], [place] [address] [place], [place] [address]    SPECREQUEST  12/24/2019 1730    BOTTLES DRAWN AEROBIC ONLY Blood Culture adequate volume Performed at [facility] [address], [place], [place] [address]    SPECREQUEST  12/24/2019 1730    BOTTLES DRAWN AEROBIC ONLY Blood Culture adequate volume Performed at [facility] [address], [place], [place] [address]    CULT  12/24/2019 1730    NO GROWTH 6 DAYS Performed at [facility] [address] [place], [place] [address]    CULT  12/24/2019 1730    NO GROWTH 6 DAYS Performed at [facility], [place] [address], [place], [place] [address]    REPTSTATUS 12/30/2019 FINAL 12/24/2019 1730   REPTSTATUS  12/30/2019 FINAL 12/24/2019 1730     Radiological Exams on Admission: CT ABDOMEN PELVIS W CONTRAST  Result Date: 12/04/2021 CLINICAL DATA:  Abdominal pain and weakness. EXAM: CT ANGIOGRAPHY CHEST CT ABDOMEN AND PELVIS WITH CONTRAST TECHNIQUE: Multidetector CT imaging of the chest was performed using the standard protocol during bolus administration of intravenous contrast. Multiplanar CT image reconstructions and MIPs were obtained to evaluate the vascular anatomy. Multidetector CT imaging of the abdomen and pelvis was performed using the standard protocol during bolus administration of intravenous contrast. RADIATION DOSE REDUCTION: This exam was performed according to the departmental dose-optimization program which includes automated exposure control, adjustment of the mA and/or kV according to patient size and/or use of iterative  reconstruction technique. CONTRAST:  139m OMNIPAQUE IOHEXOL 350 MG/ML SOLN COMPARISON:  CT abdomen and pelvis 12/24/2019. CT angiogram chest 05/24/2018. FINDINGS: CTA CHEST FINDINGS Cardiovascular: Satisfactory opacification of the pulmonary arteries to the segmental level. No evidence of pulmonary embolism. Normal heart size. No pericardial effusion. Mediastinum/Nodes: No enlarged mediastinal, hilar, or axillary lymph nodes. Thyroid gland, trachea, and esophagus demonstrate no significant findings. Lungs/Pleura: Lungs are clear. No pleural effusion or pneumothorax. Musculoskeletal: No chest wall abnormality. No acute or significant osseous findings. Review of the MIP images confirms the above findings. CT ABDOMEN and PELVIS FINDINGS Hepatobiliary: Liver is enlarged. There is diffuse fatty infiltration as seen on prior examination. Gallbladder and bile ducts are within normal limits. Pancreas: There is a moderate amount of peripancreatic fluid and stranding diffusely. Pancreas enhances normally. Within the head of the pancreas there is a heterogeneous hypodense area measuring 1.7 x 1.3 cm, indeterminate. This is ill-defined. Spleen: Normal in size without focal abnormality. Adrenals/Urinary Tract: Adrenal glands are unremarkable. Kidneys are normal, without renal calculi, focal lesion, or hydronephrosis. Bladder is unremarkable. Stomach/Bowel: Stomach is within normal limits. Appendix appears normal. No evidence of bowel wall thickening, distention, or inflammatory changes. There is submucosal fat deposition throughout the colon as seen on the prior study. Vascular/Lymphatic: No significant vascular findings are present. No enlarged abdominal or pelvic lymph nodes. Reproductive: Uterus and bilateral adnexa are unremarkable. Other: Small fat containing umbilical hernia. No free fluid or free air. Musculoskeletal: No acute or significant osseous findings. Review of the MIP images confirms the above findings.  IMPRESSION: 1. No evidence for pulmonary embolism or other acute cardiopulmonary process. 2. Findings compatible with acute pancreatitis. There is a heterogeneous hypodense area in the head of the pancreas measuring 1.7 cm which is indeterminate. This may represent developing pseudocyst or focal pancreatitis, but underlying lesion is not excluded. 3. Hepatomegaly with fatty infiltration of the liver. 4. Submucosal fat deposition throughout the colon is likely related to chronic inflammation. Electronically Signed   By: ARonney AstersM.D.   On: 12/04/2021 17:33   CT Angio Chest PE W and/or Wo Contrast  Result Date: 12/04/2021 CLINICAL DATA:  Abdominal pain and weakness. EXAM: CT ANGIOGRAPHY CHEST CT ABDOMEN AND PELVIS WITH CONTRAST TECHNIQUE: Multidetector CT imaging of the chest was performed using the standard protocol during bolus administration of intravenous contrast. Multiplanar CT image reconstructions and MIPs were obtained to evaluate the vascular anatomy. Multidetector CT imaging of the abdomen and pelvis was performed using the standard protocol during bolus administration of intravenous contrast. RADIATION DOSE REDUCTION: This exam was performed according to the departmental dose-optimization program which includes automated exposure control, adjustment of the mA and/or kV according to patient size and/or use of iterative reconstruction technique. CONTRAST:  1025mOMNIPAQUE IOHEXOL 350 MG/ML SOLN COMPARISON:  CT abdomen and pelvis 12/24/2019. CT angiogram chest 05/24/2018. FINDINGS: CTA CHEST FINDINGS Cardiovascular: Satisfactory opacification of the pulmonary arteries to the segmental level. No evidence of pulmonary embolism. Normal heart size. No pericardial effusion. Mediastinum/Nodes: No enlarged mediastinal, hilar, or axillary lymph nodes. Thyroid gland, trachea, and esophagus demonstrate no significant findings. Lungs/Pleura: Lungs are clear. No pleural effusion or pneumothorax.  Musculoskeletal: No chest wall abnormality. No acute or significant osseous findings. Review of the MIP images confirms the above findings. CT ABDOMEN and PELVIS FINDINGS Hepatobiliary: Liver is enlarged. There is diffuse fatty infiltration as seen on prior examination. Gallbladder and bile ducts are within normal limits. Pancreas: There is a moderate amount of peripancreatic fluid and stranding diffusely. Pancreas enhances normally. Within the head of the pancreas there is a heterogeneous hypodense area measuring 1.7 x 1.3 cm, indeterminate. This is ill-defined. Spleen: Normal in size without focal abnormality. Adrenals/Urinary Tract: Adrenal glands are unremarkable. Kidneys are normal, without renal calculi, focal lesion, or hydronephrosis. Bladder is unremarkable. Stomach/Bowel: Stomach is within normal limits. Appendix appears normal. No evidence of bowel wall thickening, distention, or inflammatory changes. There is submucosal fat deposition throughout the colon as seen on the prior study. Vascular/Lymphatic: No significant vascular findings are present. No enlarged abdominal or pelvic lymph nodes. Reproductive: Uterus and bilateral adnexa are unremarkable. Other: Small fat containing umbilical hernia. No free fluid or free air. Musculoskeletal: No acute or significant osseous findings. Review of the MIP images confirms the above findings. IMPRESSION: 1. No evidence for pulmonary embolism or other acute cardiopulmonary process. 2. Findings compatible with acute pancreatitis. There is a heterogeneous hypodense area in the head of the pancreas measuring 1.7 cm which is indeterminate. This may represent developing pseudocyst or focal pancreatitis, but underlying lesion is not excluded. 3. Hepatomegaly with fatty infiltration of the liver. 4. Submucosal fat deposition throughout the colon is likely related to chronic inflammation. Electronically Signed   By: Ronney Asters M.D.   On: 12/04/2021 17:33    _______________________________________________________________________________________________________ Latest  Blood pressure (!) 150/92, pulse (!) 115, temperature (!) 97.5 F (36.4 C), temperature source Oral, resp. rate (!) 26, height '5\' 7"'$  (1.702 m), weight 97 kg, SpO2 98 %.   Vitals  labs and radiology finding personally reviewed  Review of Systems:    Pertinent positives include:   fatigue, abdominal pain, nausea, vomiting, diarrhea,   Constitutional:  No weight loss, night sweats, Fevers, chills, weight loss  HEENT:  No headaches, Difficulty swallowing,Tooth/dental problems,Sore throat,  No sneezing, itching, ear ache, nasal congestion, post nasal drip,  Cardio-vascular:  No chest pain, Orthopnea, PND, anasarca, dizziness, palpitations.no Bilateral lower extremity swelling  GI:  No heartburn, indigestion,change in bowel habits, loss of appetite, melena, blood in stool, hematemesis Resp:  no shortness of breath at rest. No dyspnea on exertion, No excess mucus, no productive cough, No non-productive cough, No coughing up of blood.No change in color of mucus.No wheezing. Skin:  no rash or lesions. No jaundice GU:  no dysuria, change in color of urine, no urgency or frequency. No straining to urinate.  No flank pain.  Musculoskeletal:  No joint pain or no joint swelling. No decreased range of motion. No back pain.  Psych:  No change in mood or affect. No depression or anxiety. No memory loss.  Neuro: no localizing neurological complaints, no tingling, no weakness, no double vision, no gait abnormality, no slurred speech, no confusion  All systems reviewed and apart from [place] all are negative _______________________________________________________________________________________________ Past Medical History:  Past Medical History:  Diagnosis Date   AKI (acute kidney injury) ([place])    Hypertension    Pancreatitis       Past Surgical History:  Procedure Laterality Date    FOOT SURGERY      Social History:  Ambulatory   independently       reports that she quit smoking about 3 years ago. Her smoking use included cigarettes. She smoked an average of 4 packs per day. She has never used smokeless tobacco. She reports that she does not currently use alcohol. She reports that she does not use drugs.   Family History:  Family History  Problem Relation Age of Onset   Hypertension Mother    Diabetes Mother    Hypertension Father    Hypertension Brother    ______________________________________________________________________________________________ Allergies: Allergies  Allergen Reactions   Ibuprofen Nausea And Vomiting     Prior to Admission medications   Medication Sig Start Date End Date Taking? Authorizing Provider  insulin glargine (LANTUS SOLOSTAR) 100 UNIT/ML Solostar Pen Inject 10 Units into the skin at bedtime. 05/13/21   Ladell Pier, MD  lisinopril (ZESTRIL) 2.5 MG tablet TAKE 1 TABLET(2.5 MG) BY MOUTH DAILY 09/13/21   Ladell Pier, MD  metFORMIN (GLUCOPHAGE) 1000 MG tablet Take 1 tablet (1,000 mg total) by mouth 2 (two) times daily with a meal. 02/26/21   Ladell Pier, MD  Multiple Vitamin (MULTIVITAMIN WITH MINERALS) TABS tablet Take 1 tablet by mouth daily. 01/18/19   Hosie Poisson, MD    ___________________________________________________________________________________________________ Physical Exam:    12/04/2021    3:15 PM 12/04/2021    3:00 PM 12/04/2021    2:00 PM  Vitals with BMI  Systolic [phone]  Diastolic 92 518 841  Pulse 115 118 122    1. General:  in No  Acute distress    Chronically ill   -appearing 2. Psychological: Alert and   Oriented 3. Head/ENT:    Dry Mucous Membranes                          Head Non traumatic, neck supple                        Poor Dentition 4. SKIN:  decreased Skin turgor,  Skin clean Dry and intact no rash 5. Heart: Regular rate and rhythm no Murmur, no Rub or  gallop 6. Lungs:  Clear to auscultation bilaterally, no wheezes or crackles   7. Abdomen: Soft,  mildly tender, Non distended   obese   8. Lower extremities: no clubbing, cyanosis, no  edema 9. Neurologically Grossly intact, moving all 4 extremities equally  10. MSK: Normal range of motion    Chart has been reviewed  ______________________________________________________________________________________________  Assessment/Plan 36 y.o. female with medical history significant of DM2, etoh Abuse, HTN     Admitted for   Alcohol-induced acute pancreatitis without infection or necrosis   Present on Admission:  Essential hypertension  Pancreatitis  Diabetes mellitus type 2 in obese (HCC)  Alcohol abuse  Metabolic acidosis  Macrocytic anemia     Essential hypertension Allow permissive hypertension for tonight  Pancreatitis most likely cause being alcohol induced,   CT of abdomen showing acute pancreatitis early pseudocyst Will rehydrate with aggressive IV fluids Keep n.p.o. Follow clinically  -  strongly advised patient to stop drinking alcohol -Check lipid panel Control pain with IV pain medications If no significant  improvement in the next 24 hours may benefit from GI consult     Diabetes mellitus type 2 in obese (HCC) Order SSI  Hold po meds  Alcohol abuse Order CIWA protocol Rehydrate patient Patient is on thiamine  Metabolic acidosis Rehydrate Check lactic acid Check beta hydroxybutyric acid presents Patient does have a history of type 2 diabetes no evidence of hyperglycemia Could have euglycemic ketoacidosis in the setting of decreased p.o. intake secondary to nausea vomiting Add D5 1/2 NS and follow acidosis  Macrocytic anemia Obtain anemia panel    Other plan as per orders.  DVT prophylaxis:  SCD    Code Status:    Code Status: Prior FULL CODE e as per patient   I had personally discussed CODE STATUS with patient    Family Communication:   Family  not at  Bedside    Disposition Plan:    To home once workup is complete and patient is stable   Following barriers for discharge:                            Electrolytes corrected                                                          Pain controlled with PO medications                                                  Diabetes care coordinator                    Consults called: none  Admission status:  ED Disposition     ED Disposition  Admit   Condition  --   [place]: [place] [[id number]]  Level of Care: Progressive [102]  Admit to Progressive based on following criteria: NEUROLOGICAL AND NEUROSURGICAL complex patients with significant risk of instability, who do not meet ICU criteria, yet require close observation or frequent assessment (< / = every 2 - 4 hours) with medical / nursing intervention.  May place patient in observation at [facility] or [place] if equivalent level of care is available:: No  Covid Evaluation: Asymptomatic - no recent exposure (last 10 days) testing not required  Diagnosis: Pancreatitis [[id number]]  Admitting Physician: Toy Baker [[id number]]  Attending Physician: Toy Baker [[id number]]          Obs     Level of care       progressive tele indefinitely please discontinue once patient no longer qualifies COVID-19 Labs   Alixandra Alfieri 12/04/2021, 8:08 PM    [facility]     after 2 AM please page floor coverage PA If 7AM-7PM, please contact the day team taking care of the patient using [email or web address]   Patient was evaluated in the context of the global COVID-19 pandemic, which necessitated consideration that the patient might be at risk for infection with the SARS-CoV-2 virus that causes COVID-19. Institutional protocols and algorithms that pertain to the evaluation of patients at risk for COVID-19 are in a state of rapid change based on information released by regulatory bodies including  the [employer] and federal and state organizations. These policies and algorithms were followed during the patient's care.

## 2021-12-04 NOTE — Assessment & Plan Note (Signed)
Obtain anemia panel 

## 2021-12-04 NOTE — Progress Notes (Signed)
Provider placed transfer orders for patient to be accompanied by RN to SDU for glucos stabilizer. Charge RN notified, [facility] notified, patient to transfer to room 1238

## 2021-12-04 NOTE — Assessment & Plan Note (Signed)
Order SSI  Hold po meds

## 2021-12-04 NOTE — Assessment & Plan Note (Signed)
Order CIWA protocol Rehydrate patient Patient is on thiamine

## 2021-12-04 NOTE — ED Triage Notes (Signed)
C/o generalized abd pain with n/v/d and weakness x2 days.  Hx DM and hasn't taken metformin in 3 days  '4mg'$  Zofran and 531m NS with EMS.  Cbg- 133.

## 2021-12-05 DIAGNOSIS — E1110 Type 2 diabetes mellitus with ketoacidosis without coma: Secondary | ICD-10-CM

## 2021-12-05 DIAGNOSIS — E669 Obesity, unspecified: Secondary | ICD-10-CM | POA: Diagnosis present

## 2021-12-05 DIAGNOSIS — N179 Acute kidney failure, unspecified: Secondary | ICD-10-CM | POA: Diagnosis present

## 2021-12-05 LAB — BASIC METABOLIC PANEL
Anion gap: 14 (ref 5–15)
Anion gap: 13 (ref 5–15)
Anion gap: 10 (ref 5–15)
BUN: 19 mg/dL (ref 6–20)
BUN: 20 mg/dL (ref 6–20)
BUN: 15 mg/dL (ref 6–20)
CO2: 18 mmol/L — ABNORMAL LOW (ref 22–32)
CO2: 21 mmol/L — ABNORMAL LOW (ref 22–32)
CO2: 23 mmol/L (ref 22–32)
Calcium: 9.3 mg/dL (ref 8.9–10.3)
Calcium: 9.7 mg/dL (ref 8.9–10.3)
Calcium: 9.8 mg/dL (ref 8.9–10.3)
Chloride: 101 mmol/L (ref 98–111)
Chloride: 100 mmol/L (ref 98–111)
Chloride: 98 mmol/L (ref 98–111)
Creatinine, Ser: 1.05 mg/dL — ABNORMAL HIGH (ref 0.44–1.00)
Creatinine, Ser: 1.1 mg/dL — ABNORMAL HIGH (ref 0.44–1.00)
Creatinine, Ser: 0.88 mg/dL (ref 0.44–1.00)
GFR, Estimated: >60 mL/min (ref >60)
GFR, Estimated: >60 mL/min (ref >60)
GFR, Estimated: >60 mL/min (ref >60)
Glucose, Bld: 147 mg/dL — ABNORMAL HIGH (ref 70–99)
Glucose, Bld: 131 mg/dL — ABNORMAL HIGH (ref 70–99)
Glucose, Bld: 138 mg/dL — ABNORMAL HIGH (ref 70–99)
Potassium: 4.1 mmol/L (ref 3.5–5.1)
Potassium: 4 mmol/L (ref 3.5–5.1)
Potassium: 3.6 mmol/L (ref 3.5–5.1)
Sodium: 133 mmol/L — ABNORMAL LOW (ref 135–145)
Sodium: 134 mmol/L — ABNORMAL LOW (ref 135–145)
Sodium: 131 mmol/L — ABNORMAL LOW (ref 135–145)

## 2021-12-05 LAB — LIPID PANEL
Cholesterol: 351 mg/dL — ABNORMAL HIGH (ref 0–200)
HDL: 86 mg/dL (ref >40)
LDL Cholesterol: 194 mg/dL — ABNORMAL HIGH (ref 0–99)
Total CHOL/HDL Ratio: 4.1 RATIO
Triglycerides: 357 mg/dL — ABNORMAL HIGH (ref <150)
VLDL: 71 mg/dL — ABNORMAL HIGH (ref 0–40)

## 2021-12-05 LAB — CBC
HCT: 37.7 % (ref 36.0–46.0)
Hemoglobin: 12.7 g/dL (ref 12.0–15.0)
MCH: 30.9 pg (ref 26.0–34.0)
MCHC: 33.7 g/dL (ref 30.0–36.0)
MCV: 91.7 fL (ref 80.0–100.0)
Platelets: 205 10*3/uL (ref 150–400)
RBC: 4.11 MIL/uL (ref 3.87–5.11)
RDW: 15.6 % — ABNORMAL HIGH (ref 11.5–15.5)
WBC: 10.6 10*3/uL — ABNORMAL HIGH (ref 4.0–10.5)
nRBC: 0 % (ref 0.0–0.2)

## 2021-12-05 LAB — GLUCOSE, CAPILLARY
Glucose-Capillary: 100 mg/dL — ABNORMAL HIGH (ref 70–99)
Glucose-Capillary: 100 mg/dL — ABNORMAL HIGH (ref 70–99)
Glucose-Capillary: 118 mg/dL — ABNORMAL HIGH (ref 70–99)
Glucose-Capillary: 126 mg/dL — ABNORMAL HIGH (ref 70–99)
Glucose-Capillary: 131 mg/dL — ABNORMAL HIGH (ref 70–99)
Glucose-Capillary: 135 mg/dL — ABNORMAL HIGH (ref 70–99)
Glucose-Capillary: 139 mg/dL — ABNORMAL HIGH (ref 70–99)
Glucose-Capillary: 141 mg/dL — ABNORMAL HIGH (ref 70–99)
Glucose-Capillary: 141 mg/dL — ABNORMAL HIGH (ref 70–99)
Glucose-Capillary: 145 mg/dL — ABNORMAL HIGH (ref 70–99)
Glucose-Capillary: 149 mg/dL — ABNORMAL HIGH (ref 70–99)
Glucose-Capillary: 150 mg/dL — ABNORMAL HIGH (ref 70–99)
Glucose-Capillary: 161 mg/dL — ABNORMAL HIGH (ref 70–99)
Glucose-Capillary: 211 mg/dL — ABNORMAL HIGH (ref 70–99)

## 2021-12-05 LAB — OSMOLALITY: Osmolality: 315 mOsm/kg — ABNORMAL HIGH (ref 275–295)

## 2021-12-05 LAB — COMPREHENSIVE METABOLIC PANEL
ALT: 27 U/L (ref 0–44)
AST: 46 U/L — ABNORMAL HIGH (ref 15–41)
Albumin: 4.2 g/dL (ref 3.5–5.0)
Alkaline Phosphatase: 61 U/L (ref 38–126)
Anion gap: 18 — ABNORMAL HIGH (ref 5–15)
BUN: 20 mg/dL (ref 6–20)
CO2: 16 mmol/L — ABNORMAL LOW (ref 22–32)
Calcium: 9.3 mg/dL (ref 8.9–10.3)
Chloride: 98 mmol/L (ref 98–111)
Creatinine, Ser: 1.17 mg/dL — ABNORMAL HIGH (ref 0.44–1.00)
GFR, Estimated: >60 mL/min (ref >60)
Glucose, Bld: 198 mg/dL — ABNORMAL HIGH (ref 70–99)
Potassium: 4.8 mmol/L (ref 3.5–5.1)
Sodium: 132 mmol/L — ABNORMAL LOW (ref 135–145)
Total Bilirubin: 2.4 mg/dL — ABNORMAL HIGH (ref 0.3–1.2)
Total Protein: 8.2 g/dL — ABNORMAL HIGH (ref 6.5–8.1)

## 2021-12-05 LAB — PHOSPHORUS: Phosphorus: 1.3 mg/dL — ABNORMAL LOW (ref 2.5–4.6)

## 2021-12-05 LAB — MRSA NEXT GEN BY PCR, NASAL: MRSA by PCR Next Gen: NOT DETECTED

## 2021-12-05 LAB — BETA-HYDROXYBUTYRIC ACID
Beta-Hydroxybutyric Acid: 7.11 mmol/L — ABNORMAL HIGH (ref 0.05–0.27)
Beta-Hydroxybutyric Acid: 3.7 mmol/L — ABNORMAL HIGH (ref 0.05–0.27)

## 2021-12-05 LAB — OSMOLALITY, URINE: Osmolality, Ur: 458 mOsm/kg (ref 300–900)

## 2021-12-05 LAB — MAGNESIUM
Magnesium: 1.9 mg/dL (ref 1.7–2.4)
Magnesium: 1.9 mg/dL (ref 1.7–2.4)

## 2021-12-05 LAB — PREALBUMIN: Prealbumin: 26 mg/dL (ref 18–38)

## 2021-12-05 LAB — HIV ANTIBODY (ROUTINE TESTING W REFLEX): HIV Screen 4th Generation wRfx: NONREACTIVE

## 2021-12-05 MED ORDER — INSULIN ASPART 100 UNIT/ML IJ SOLN
0.0000 [IU] | Freq: Three times a day (TID) | INTRAMUSCULAR | Status: DC
  Administered 2021-12-05 – 2021-12-07 (×3): 2 [IU] via SUBCUTANEOUS

## 2021-12-05 MED ORDER — PANTOPRAZOLE SODIUM 40 MG PO TBEC
40.0000 mg | DELAYED_RELEASE_TABLET | Freq: Every day | ORAL | Status: DC
  Administered 2021-12-05 – 2021-12-07 (×3): 40 mg via ORAL
  Filled 2021-12-05 (×3): qty 1

## 2021-12-05 MED ORDER — SODIUM CHLORIDE 0.9 % IV SOLN: INTRAVENOUS | Status: DC | PRN

## 2021-12-05 MED ORDER — LABETALOL HCL 5 MG/ML IV SOLN
10.0000 mg | INTRAVENOUS | Status: AC | PRN
  Administered 2021-12-05: 10 mg via INTRAVENOUS
  Filled 2021-12-05: qty 4

## 2021-12-05 MED ORDER — HYDROMORPHONE HCL 1 MG/ML IJ SOLN: 0.5000 mg | INTRAMUSCULAR | Status: DC | PRN

## 2021-12-05 MED ORDER — INSULIN GLARGINE-YFGN 100 UNIT/ML ~~LOC~~ SOLN
10.0000 [IU] | Freq: Every day | SUBCUTANEOUS | Status: DC
  Administered 2021-12-05 – 2021-12-07 (×3): 10 [IU] via SUBCUTANEOUS
  Filled 2021-12-05 (×4): qty 0.1

## 2021-12-05 MED ORDER — LABETALOL HCL 5 MG/ML IV SOLN: 10.0000 mg | Freq: Once | INTRAVENOUS | Status: DC

## 2021-12-05 MED ORDER — SODIUM CHLORIDE 0.9 % IV SOLN: INTRAVENOUS | Status: DC

## 2021-12-05 NOTE — Assessment & Plan Note (Deleted)
Anion gap has closes at 13, her fasting glucose is 134. At home she uses 10 units of basal insulin.  If patient tolerates clear liquids will bridge to SQ insulin.  Over last 2 hrs she has been using 1.4 units per hr, will be up to 33 units per 24 hrs at that rate. Will resume her 10 units sq now and will continue capillary glucose monitoring.  Cover with insulin sliding scale Follow up BMP this pm, she may need redose of long acting insulin this pm.

## 2021-12-05 NOTE — Assessment & Plan Note (Signed)
Chronic anemia, cell count has been stable.

## 2021-12-05 NOTE — Progress Notes (Signed)
Initial Nutrition Assessment  DOCUMENTATION CODES:   Obesity unspecified  INTERVENTION:  - Advance diet as tolerated.   NUTRITION DIAGNOSIS:   Inadequate oral intake related to chronic illness as evidenced by other (comment) (on CL diet).  GOAL:   Patient will meet greater than or equal to 90% of their needs  MONITOR:   PO intake, Diet advancement  REASON FOR ASSESSMENT:   Consult Assessment of nutrition requirement/status  ASSESSMENT:   36 y.o. female admits related to abdominal pain. PMH includes: T2DM, EtOH abuse, HTN. Pt is currently receiving medical management for alcohol induced pancreatitis and DKA.  Meds include: sliding scale insulin, semglee (10 units), MVI, thiamine. Labs reviewed: Na low.   Pt is currently on a clear liquid diet. No wt loss per record. RD will continue to monitor for diet advancement.   NUTRITION - FOCUSED PHYSICAL EXAM:  RD unable to assess, will attempt at f/u.   Diet Order:   Diet Order             Diet clear liquid Room service appropriate? Yes; Fluid consistency: Thin  Diet effective now                   EDUCATION NEEDS:   Not appropriate for education at this time  Skin:  Skin Assessment: Reviewed RN Assessment  Last BM:  unknown  Height:   Ht Readings from Last 1 Encounters:  12/04/21 '5\' 7"'$  (1.702 m)    Weight:   Wt Readings from Last 1 Encounters:  12/04/21 90.5 kg    Ideal Body Weight:     BMI:  Body mass index is 31.25 kg/m.  Estimated Nutritional Needs:   Kcal:  2260-2530 kcals  Protein:  110-125 gm  Fluid:  >/= 2.2 L  Thalia Bloodgood, RD, LDN, CNSC.

## 2021-12-05 NOTE — Assessment & Plan Note (Addendum)
Her blood pressure has been elevated, will resume her antihypertensive regimen with lisinopril.  At home is on 2,5 mg will start at 5 mg today due to uncontrolled hypertension.

## 2021-12-05 NOTE — Assessment & Plan Note (Signed)
Calculated BMI is 31.2

## 2021-12-05 NOTE — Progress Notes (Signed)
Progress Note   Patient: Yolanda Flores FBP:[id number] DOB: 27-May-1985 DOA: 12/04/2021     0 DOS: the patient was seen and examined on 12/05/2021   Brief hospital course: Yolanda Flores was  admitted to the hospital with the working diagnosis of acute alcohol related pancreatitis, complicated with DKA   36 yo female with the past medical history of T2DM, alcohol abuse, and hypertension who presented with severe periumbilical abdominal pain. Patient reported 2 days of nausea, vomiting, generalize fatigue and abdominal pain. She drinks alcohol 3 days per week and her last drink was 24 hrs prior coming to the ED. On her initial physical examination her blood pressure was 138/103, HR 129, RR 18 and 02 saturation 100%, lungs with no wheezing or rales, heart with S1 and S2 present and rhythmic, abdomen with tender to palpation with no rebound, no lower extremity edema.  Na 134, K 4.3 CL 97 bicarbonate 12 glucose 161, bun 23 cr 1,0 Wbc 10.7 hgb 13.3 plt 237  Lactic acid 1,7 and 2,4  Anion gap 24  Alcohol 61  Toxicology negative   CT chest, abdomen and pelvis with no pulmonary embolism, acute pancreatitis. 1,7 cm hypodense heterogenous lesion, possible early pseudocyst or focal pancreatitis at the head of the pancreas. Fatty liver with hepatomegaly. \   EKG 123 bpm, left axis, with left anterior fascicular block, sinus rhythm with poor  R R wave progression with no significant ST segment or T wave changes.   Patient was placed on IV fluids and analgesics  Insulin drip for DKA   Assessment and Plan: * Alcohol induced acute pancreatitis Patient continue to have abdominal pain but has improved from admission. No nausea, vomiting or diarrhea.  Plan to continue supportive medical care Advance diet to clears. Continue pain control with hydromorphone Add antiacid therapy with pantoprazole and continue with as needed antiemetics.  Out of bed to chair tid with meals.    DKA (diabetic ketoacidosis)  (Yolanda Flores) Anion gap has closes at 13, her fasting glucose is 134. At home she uses 10 units of basal insulin.  If patient tolerates clear liquids will bridge to SQ insulin.  Over last 2 hrs she has been using 1.4 units per hr, will be up to 33 units per 24 hrs at that rate. Will resume her 10 units sq now and will continue capillary glucose monitoring.  Cover with insulin sliding scale Follow up BMP this pm, she may need redose of long acting insulin this pm.   Essential hypertension Blood pressure has been stable, for now will plan to continue to hold on lisinopril.   Alcohol abuse Patient drinks alcohol on regular bases, her alcohol level was elevated on admission.  No clinical signs of alcohol withdrawal Will hold on CIWA IV lorazepam and will transition to oral lorazepam as needed.  Continue oral thiamine and multivitamins She will need outpatient support for alcohol cessation.   Macrocytic anemia Chronic anemia, cell count has been stable.   Diabetes mellitus type 2 in obese (HCC) At home patient on metformin and insulin basal 10 units Hold on metformin for now, until complete resolution of DKA   AKI (acute kidney injury) (Yolanda Flores) Hyponatremia, hyperkalemia  Renal function has improved with serum cr at 1,10 with K at 4,0 and Na 134.  Plan to discontinue D5 LR if patient tolerated well clear liquids po and continue IV fluids with isotonic saline, 50 ml per Hr.   Follow up BMP at 16:00 with Mg and correct electrolytes,  to keep K at 4 and Mg at 2.   Class 1 obesity Calculated BMI is 31.2         Subjective: Patient is feeling better, her abdominal pain is improved but not back to baseline, no nausea or vomiting no diarrhea   Physical Exam: Vitals:   12/05/21 0600 12/05/21 0700 12/05/21 0800 12/05/21 0810  BP: 134/88 (!) 152/112 (!) 187/116 134/82  Pulse: (!) 107 100 (!) 101 (!) 107  Resp:  18 15 (!) 23  Temp:   98.4 F (36.9 C)   TempSrc:   Oral   SpO2: 98% 98% 100%  100%  Weight:      Height:       Neurology awake and alert, no tremors or anxiety ENT with mild pallor Cardiovascular with S1 and S2 present and tachycardic with no gallops, rubs or murmurs Respiratory with no rales or wheezing Abdomen with mild tender to superficial palpation, no distended and no rebound No lower extremity edema  Data Reviewed:    Family Communication: family sleeping at the bedside   Disposition: Status is: Observation The patient remains OBS appropriate and will d/c before 2 midnights.  Planned Discharge Destination: Home     Author: Tawni Millers, MD 12/05/2021 9:14 AM  For on call review [email or web address].

## 2021-12-05 NOTE — Discharge Instructions (Signed)
The patient will make her own appointment for Social Services.

## 2021-12-05 NOTE — Assessment & Plan Note (Deleted)
Hyponatremia, hyperkalemia  Renal function has improved with serum cr at 1,10 with K at 4,0 and Na 134.  Plan to discontinue D5 LR if patient tolerated well clear liquids po and continue IV fluids with isotonic saline, 50 ml per Hr.   Follow up BMP at 16:00 with Mg and correct electrolytes, to keep K at 4 and Mg at 2.

## 2021-12-05 NOTE — Assessment & Plan Note (Addendum)
Continue insulin therapy 10 units basal and sliding scale, will advance diet progressively as tolerated for acute pancreatitis.

## 2021-12-05 NOTE — Assessment & Plan Note (Addendum)
Continue to improve abdominal pain with no nausea or vomiting, no diarrhea.   Plan to continue to advance diet to soft, pain control with oxycodone and hydromorphone.  Antiacid therapy and as needed antiemetics.  Continue with NS at 50 ml per hr for maintenance fluids for 24 hrs more. Transfer to med surg and discontinue telemetry.

## 2021-12-05 NOTE — Hospital Course (Addendum)
Yolanda Flores was  admitted to the hospital with the working diagnosis of acute alcohol related pancreatitis, complicated with DKA   36 yo female with the past medical history of T2DM, alcohol abuse, and hypertension who presented with severe periumbilical abdominal pain. Patient reported 2 days of nausea, vomiting, generalize fatigue and abdominal pain. She drinks alcohol 3 days per week and her last drink was 24 hrs prior coming to the ED. On her initial physical examination her blood pressure was 138/103, HR 129, RR 18 and 02 saturation 100%, lungs with no wheezing or rales, heart with S1 and S2 present and rhythmic, abdomen with tender to palpation with no rebound, no lower extremity edema.  Na 134, K 4.3 CL 97 bicarbonate 12 glucose 161, bun 23 cr 1,0 Wbc 10.7 hgb 13.3 plt 237  Lactic acid 1,7 and 2,4  Anion gap 24  Alcohol 61  Toxicology negative   CT chest, abdomen and pelvis with no pulmonary embolism, acute pancreatitis. 1,7 cm hypodense heterogenous lesion, possible early pseudocyst or focal pancreatitis at the head of the pancreas. Fatty liver with hepatomegaly. \   EKG 123 bpm, left axis, with left anterior fascicular block, sinus rhythm with poor  R R wave progression with no significant ST segment or T wave changes.   Patient was placed on IV fluids and analgesics  Insulin drip for DKA   11/24 Patient off insulin drip and transitioned to sq insulin with good toleration.  Transfer to med surg and advance diet to soft.

## 2021-12-05 NOTE — Assessment & Plan Note (Addendum)
No clinical signs of withdrawal, continue with as needed oral lorazepam.  Continue oral thiamine and multivitamins She will need outpatient support for alcohol cessation.

## 2021-12-05 NOTE — Inpatient Diabetes Management (Signed)
Inpatient Diabetes Program Recommendations  AACE/ADA: New Consensus Statement on Inpatient Glycemic Control (2015)  Target Ranges:  Prepandial:   less than 140 mg/dL      Peak postprandial:   less than 180 mg/dL (1-2 hours)      Critically ill patients:  140 - 180 mg/dL   Lab Results  Component Value Date   GLUCAP 141 (H) 12/05/2021   HGBA1C 7.3 (H) 04/12/2021    Latest Reference Range & Units 12/04/21 11:04  Sodium 135 - 145 mmol/L 134 (L)  Potassium 3.5 - 5.1 mmol/L 4.3  Chloride 98 - 111 mmol/L 97 (L)  CO2 22 - 32 mmol/L 12 (L)  Glucose 70 - 99 mg/dL 161 (H)  BUN 6 - 20 mg/dL 23 (H)  Creatinine 0.44 - 1.00 mg/dL 1.00  Calcium 8.9 - 10.3 mg/dL 9.3  Anion gap 5 - 15  25 (H)  (L): Data is abnormally low (H): Data is abnormally high  Latest Reference Range & Units 12/05/21 07:59  Sodium 135 - 145 mmol/L 134 (L)  Potassium 3.5 - 5.1 mmol/L 4.0  Chloride 98 - 111 mmol/L 100  CO2 22 - 32 mmol/L 21 (L)  Glucose 70 - 99 mg/dL 131 (H)  BUN 6 - 20 mg/dL 20  Creatinine 0.44 - 1.00 mg/dL 1.10 (H)  Calcium 8.9 - 10.3 mg/dL 9.7  Anion gap 5 - 15  13  (L): Data is abnormally low (H): Data is abnormally high  Diabetes history: DM2 Outpatient Diabetes medications: Lantus 10 units qd, Metformin 1 gm bid Current orders for Inpatient glycemic control: Semglee 10 units qd, Novolog 0-15 units tid correction  Inpatient Diabetes Program Recommendations:   Agree with Metformin held currently and probable held @ home until followup with PCP. Will follow during hospitalization.  Thank you, Nani Gasser. Mennie Spiller, RN, MSN, CDE  Diabetes Coordinator Inpatient Glycemic Control Team Team Pager [phone] (8am-5pm) 12/05/2021 11:26 AM

## 2021-12-05 NOTE — TOC Initial Note (Signed)
Transition of Care [facility]) - Initial/Assessment Note    Patient Details  Name: Yolanda Flores MRN: [id number] Date of Birth: 10-Mar-1985  [facility] CM/SW Contact:    Henrietta Dine, RN Phone Number: 12/05/2021, 1:57 PM  Clinical Narrative:                 [facility] consult for SA; met with pt in room; the pt says she is from home and plans to return; she says she has transportation; the pt says she does not have [place] or DME; she does not have glasses; the pt says she does not have insurance yet because she started a new job; she agrees to information from [employer]; resource placed in follow-up section of d/c instructions; she will make her own appointment; the pt declines resources for SA; she says "I don't think I need this"; no TOC needs.  Expected Discharge Plan: Home/Self Care Barriers to Discharge: Continued Medical Work up   Patient Goals and CMS Choice        Expected Discharge Plan and Services Expected Discharge Plan: Home/Self Care   Discharge Planning Services: CM Consult   Living arrangements for the past 2 months: Apartment                                      Prior Living Arrangements/Services Living arrangements for the past 2 months: Apartment Lives with:: Self   Do you feel safe going back to the place where you live?: Yes      Need for Family Participation in Patient Care: Yes (Comment) Care giver support system in place?: Yes (comment)   Criminal Activity/Legal Involvement Pertinent to Current Situation/Hospitalization: No - Comment as needed  Activities of Daily Living      Permission Sought/Granted Permission sought to share information with : Case Manager Permission granted to share information with : Yes, Verbal Permission Granted  Share Information with NAME: Lenor Coffin, RN, CM           Emotional Assessment Appearance:: Appears stated age Attitude/Demeanor/Rapport: Gracious Affect (typically  observed): Accepting Orientation: : Oriented to Self, Oriented to Place, Oriented to  Time, Oriented to Situation Alcohol / Substance Use: Alcohol Use Psych Involvement: No (comment)  Admission diagnosis:  Pancreatitis [K85.90] Alcohol-induced acute pancreatitis without infection or necrosis [K85.20] DKA (diabetic ketoacidosis) ([place]) [E11.10] Patient Active Problem List   Diagnosis Date Noted   DKA (diabetic ketoacidosis) ([place]) 12/05/2021   Class 1 obesity 12/05/2021   AKI (acute kidney injury) ([place]) 12/05/2021   Former smoker 02/26/2021   Diabetes mellitus type 2 in obese ([place]) 02/26/2021   Macrocytic anemia 12/27/2019   Normocytic anemia 01/17/2019   Thrombocytosis 01/17/2019   Heartburn    Alcohol induced acute pancreatitis 01/04/2019   Essential hypertension 32/12/2480   Metabolic acidosis 50/03/7046   Alcohol abuse 05/24/2018   Abnormal LFTs 05/24/2018   Prolonged QT interval    PCP:  Ladell Pier, MD Pharmacy:   [facility], [place] [address] [place] [address] Phone: [phone] Fax: [place] [address], [place] [address] Phone: [phone] Fax: [phone]     Social Determinants of Health (SDOH) Interventions    Readmission Risk Interventions  No data to display

## 2021-12-06 LAB — HEMOGLOBIN A1C
Hgb A1c MFr Bld: 5.8 % — ABNORMAL HIGH (ref 4.8–5.6)
Mean Plasma Glucose: 120 mg/dL

## 2021-12-06 LAB — GLUCOSE, CAPILLARY
Glucose-Capillary: 125 mg/dL — ABNORMAL HIGH (ref 70–99)
Glucose-Capillary: 117 mg/dL — ABNORMAL HIGH (ref 70–99)
Glucose-Capillary: 109 mg/dL — ABNORMAL HIGH (ref 70–99)
Glucose-Capillary: 142 mg/dL — ABNORMAL HIGH (ref 70–99)

## 2021-12-06 LAB — BASIC METABOLIC PANEL
Anion gap: 10 (ref 5–15)
BUN: 13 mg/dL (ref 6–20)
CO2: 22 mmol/L (ref 22–32)
Calcium: 9.1 mg/dL (ref 8.9–10.3)
Chloride: 100 mmol/L (ref 98–111)
Creatinine, Ser: 0.75 mg/dL (ref 0.44–1.00)
GFR, Estimated: >60 mL/min (ref >60)
Glucose, Bld: 110 mg/dL — ABNORMAL HIGH (ref 70–99)
Potassium: 3.6 mmol/L (ref 3.5–5.1)
Sodium: 132 mmol/L — ABNORMAL LOW (ref 135–145)

## 2021-12-06 MED ORDER — LISINOPRIL 5 MG PO TABS
5.0000 mg | ORAL_TABLET | Freq: Every day | ORAL | Status: DC
  Administered 2021-12-06 – 2021-12-07 (×2): 5 mg via ORAL
  Filled 2021-12-06: qty 2
  Filled 2021-12-06: qty 1

## 2021-12-06 MED ORDER — POTASSIUM CHLORIDE CRYS ER 20 MEQ PO TBCR
40.0000 meq | EXTENDED_RELEASE_TABLET | Freq: Once | ORAL | Status: AC
  Administered 2021-12-06: 40 meq via ORAL
  Filled 2021-12-06: qty 2

## 2021-12-06 NOTE — Assessment & Plan Note (Signed)
Ketoacidosis has resolved, patient has been transitioned from IV insulin to sq insulin with good toleration. Her anion gap this am is 10 and she is tolerating po well.

## 2021-12-06 NOTE — Plan of Care (Signed)

## 2021-12-06 NOTE — Progress Notes (Signed)
Progress Note   Patient: Yolanda Flores OZD:[id number] DOB: 1985-09-01 DOA: 12/04/2021     1 DOS: the patient was seen and examined on 12/06/2021   Brief hospital course: Yolanda Flores was  admitted to the hospital with the working diagnosis of acute alcohol related pancreatitis, complicated with DKA   36 yo female with the past medical history of T2DM, alcohol abuse, and hypertension who presented with severe periumbilical abdominal pain. Patient reported 2 days of nausea, vomiting, generalize fatigue and abdominal pain. She drinks alcohol 3 days per week and her last drink was 24 hrs prior coming to the ED. On her initial physical examination her blood pressure was 138/103, HR 129, RR 18 and 02 saturation 100%, lungs with no wheezing or rales, heart with S1 and S2 present and rhythmic, abdomen with tender to palpation with no rebound, no lower extremity edema.  Na 134, K 4.3 CL 97 bicarbonate 12 glucose 161, bun 23 cr 1,0 Wbc 10.7 hgb 13.3 plt 237  Lactic acid 1,7 and 2,4  Anion gap 24  Alcohol 61  Toxicology negative   CT chest, abdomen and pelvis with no pulmonary embolism, acute pancreatitis. 1,7 cm hypodense heterogenous lesion, possible early pseudocyst or focal pancreatitis at the head of the pancreas. Fatty liver with hepatomegaly. \   EKG 123 bpm, left axis, with left anterior fascicular block, sinus rhythm with poor  R R wave progression with no significant ST segment or T wave changes.   Patient was placed on IV fluids and analgesics  Insulin drip for DKA   11/24 Patient off insulin drip and transitioned to sq insulin with good toleration.  Transfer to med surg and advance diet to soft.   Assessment and Plan: * Alcohol induced acute pancreatitis Continue to improve abdominal pain with no nausea or vomiting, no diarrhea.   Plan to continue to advance diet to soft, pain control with oxycodone and hydromorphone.  Antiacid therapy and as needed antiemetics.  Continue with NS  at 50 ml per hr for maintenance fluids for 24 hrs more. Transfer to med surg and discontinue telemetry.    DKA (diabetic ketoacidosis) ([place]) Ketoacidosis has resolved, patient has been transitioned from IV insulin to sq insulin with good toleration. Her anion gap this am is 10 and she is tolerating po well.   Essential hypertension Her blood pressure has been elevated, will resume her antihypertensive regimen with lisinopril.  At home is on 2,5 mg will start at 5 mg today due to uncontrolled hypertension.   Alcohol abuse No clinical signs of withdrawal, continue with as needed oral lorazepam.  Continue oral thiamine and multivitamins She will need outpatient support for alcohol cessation.   Macrocytic anemia Chronic anemia, cell count has been stable.   Diabetes mellitus type 2 in obese (HCC) Continue insulin therapy 10 units basal and sliding scale, will advance diet progressively as tolerated for acute pancreatitis.   AKI (acute kidney injury) ([place]) Her renal function is stable with serum cr at 0,75, K s 3,6 and serum bicarbonate at 22. Add 40 meq Kcl to prevent hypokalemia,  Follow up renal function and electrolytes in am.   Class 1 obesity Calculated BMI is 31.2         Subjective: Patient is feeling better, her abdominal pain has improved but not back to baseline, no nausea or vomiting, no diarrhea. Positive thirst   Physical Exam: Vitals:   12/06/21 0400 12/06/21 0410 12/06/21 0700 12/06/21 0800  BP:  (!) 147/107  Marland Kitchen)  150/107  Pulse:  78  87  Resp:  14  14  Temp: 98.3 F (36.8 C)  98.4 F (36.9 C) 98.4 F (36.9 C)  TempSrc: Axillary  Oral Oral  SpO2:  97%  100%  Weight:      Height:       Neurology awake and alert ENT with mild pallor  Cardiovascular with S1 and S2 present and rhythmic Respiratory with no rales or wheezing Abdomen with no distention, mild tender to palpation, with no guarding or rebound No lower extremity edema  Data  Reviewed:    Family Communication: I spoke with patient's boyfriend at the bedside, we talked in detail about patient's condition, plan of care and prognosis and all questions were addressed.   Disposition: Status is: Inpatient Remains inpatient appropriate because: resolving pancreatitis   Planned Discharge Destination: Home possible discharge in 24 hrs to 48 hrs      Author: Tawni Millers, MD 12/06/2021 8:40 AM  For on call review [email or web address].

## 2021-12-06 NOTE — Assessment & Plan Note (Signed)
Her renal function is stable with serum cr at 0,75, K s 3,6 and serum bicarbonate at 22. Add 40 meq Kcl to prevent hypokalemia,  Follow up renal function and electrolytes in am.

## 2021-12-07 LAB — GLUCOSE, CAPILLARY
Glucose-Capillary: 133 mg/dL — ABNORMAL HIGH (ref 70–99)
Glucose-Capillary: 144 mg/dL — ABNORMAL HIGH (ref 70–99)
Glucose-Capillary: 91 mg/dL (ref 70–99)

## 2021-12-07 LAB — BASIC METABOLIC PANEL
Anion gap: 9 (ref 5–15)
BUN: 11 mg/dL (ref 6–20)
CO2: 23 mmol/L (ref 22–32)
Calcium: 8.8 mg/dL — ABNORMAL LOW (ref 8.9–10.3)
Chloride: 102 mmol/L (ref 98–111)
Creatinine, Ser: 0.69 mg/dL (ref 0.44–1.00)
GFR, Estimated: >60 mL/min (ref >60)
Glucose, Bld: 126 mg/dL — ABNORMAL HIGH (ref 70–99)
Potassium: 3.2 mmol/L — ABNORMAL LOW (ref 3.5–5.1)
Sodium: 134 mmol/L — ABNORMAL LOW (ref 135–145)

## 2021-12-07 MED ORDER — VITAMIN B-1 100 MG PO TABS: 100.0000 mg | ORAL_TABLET | Freq: Every day | ORAL | 0 refills | Status: DC

## 2021-12-07 MED ORDER — POTASSIUM CHLORIDE CRYS ER 20 MEQ PO TBCR
40.0000 meq | EXTENDED_RELEASE_TABLET | Freq: Once | ORAL | Status: AC
  Administered 2021-12-07: 40 meq via ORAL
  Filled 2021-12-07: qty 2

## 2021-12-07 MED ORDER — HYDROCODONE-ACETAMINOPHEN 5-325 MG PO TABS: 1.0000 | ORAL_TABLET | ORAL | 0 refills | Status: DC | PRN

## 2021-12-07 MED ORDER — PANTOPRAZOLE SODIUM 40 MG PO TBEC: 40.0000 mg | DELAYED_RELEASE_TABLET | Freq: Every day | ORAL | 0 refills | Status: DC

## 2021-12-07 MED ORDER — LISINOPRIL 5 MG PO TABS: 5.0000 mg | ORAL_TABLET | Freq: Every day | ORAL | 0 refills | Status: DC

## 2021-12-07 MED ORDER — METFORMIN HCL 1000 MG PO TABS: 1000.0000 mg | ORAL_TABLET | Freq: Two times a day (BID) | ORAL | 3 refills | Status: DC

## 2021-12-07 NOTE — Plan of Care (Signed)
Pt will d/c this afternoon with her cousin. Alert and oriented; without c/o. Discharge instructions given/explained with pt verbalizing understanding.  Pt aware to followup as needed.

## 2021-12-07 NOTE — Discharge Summary (Signed)
Physician Discharge Summary  Yolanda Flores [facility]MC:[id number] DOB: 1985-08-23 DOA: 12/04/2021  PCP: Yolanda Pier, MD  Admit date: 12/04/2021 Discharge date: 12/07/2021  Admitted From: home Discharge disposition: home   Recommendations for Outpatient Follow-Up:   Alcohol cessation Follow lesion in pancreas   Discharge Diagnosis:   Principal Problem:   Alcohol induced acute pancreatitis Active Problems:   DKA (diabetic ketoacidosis) ([place])   Essential hypertension   Alcohol abuse   Macrocytic anemia   Diabetes mellitus type 2 in obese (H[facility])   AKI (acute kidney injury) ([place])   Class 1 obesity    Discharge Condition: Improved.  Diet recommendation: Low sodium, heart healthy.  Carbohydrate-modified.  Regular.  Wound care: None.  Code status: Full.   History of Present Illness:   Yolanda Flores was  admitted to the hospital with the working diagnosis of acute alcohol related pancreatitis, complicated with DKA    36 yo female with the past medical history of T2DM, alcohol abuse, and hypertension who presented with severe periumbilical abdominal pain. Patient reported 2 days of nausea, vomiting, generalize fatigue and abdominal pain. She drinks alcohol 3 days per week and her last drink was 24 hrs prior coming to the ED. On her initial physical examination her blood pressure was 138/103, HR 129, RR 18 and 02 saturation 100%, lungs with no wheezing or rales, heart with S1 and S2 present and rhythmic, abdomen with tender to palpation with no rebound, no lower extremity edema.   Na 134, K 4.3 CL 97 bicarbonate 12 glucose 161, bun 23 cr 1,0 Wbc 10.7 hgb 13.3 plt 237  Lactic acid 1,7 and 2,4  Anion gap 24  Alcohol 61  Toxicology negative    CT chest, abdomen and pelvis with no pulmonary embolism, acute pancreatitis. 1,7 cm hypodense heterogenous lesion, possible early pseudocyst or focal pancreatitis at the head of the pancreas. Fatty liver with hepatomegaly. \      EKG 123 bpm, left axis, with left anterior fascicular block, sinus rhythm with poor  R R wave progression with no significant ST segment or T wave changes.    Patient was placed on IV fluids and analgesics  Insulin drip for DKA    11/24 Patient off insulin drip and transitioned to sq insulin with good toleration.  Transfer to med surg and advance diet to soft.    Hospital Course by Problem:   Alcohol induced acute pancreatitis Continue to improve abdominal pain with no nausea or vomiting, no diarrhea.   -advanced diet -encouraged alcohol cessation     DKA (diabetic ketoacidosis) ([place]) Ketoacidosis has resolved, patient has been transitioned from IV insulin to sq insulin with good toleration.   Essential hypertension -increase ACE   Hypokalemia -replete  Alcohol abuse No clinical signs of withdrawal, continue with as needed oral lorazepam.  Continue oral thiamine and multivitamins She will need outpatient support for alcohol cessation.    Macrocytic anemia Chronic anemia, cell count has been stable.    Diabetes mellitus type 2 in obese ([place]) -resume home meds   AKI (acute kidney injury) ([place]) -resolved   Class 1 obesity Estimated body mass index is 31.25 kg/m as calculated from the following:   Height as of this encounter: '5\' 7"'$  (1.702 m).   Weight as of this encounter: 90.5 kg.       Medical Consultants:   none   Discharge Exam:   Vitals:   12/06/21 2035 12/07/21 0527  BP: (!) 136/95 (!) 137/101  Pulse: 94 79  Resp: 15 15  Temp: 98.4 F (36.9 C) 98 F (36.7 C)  SpO2: 100% 100%   Vitals:   12/06/21 0800 12/06/21 1105 12/06/21 2035 12/07/21 0527  BP: (!) 150/107 (!) 157/103 (!) 136/95 (!) 137/101  Pulse: 87 92 94 79  Resp: 14 (!) '22 15 15  '$ Temp: 98.4 F (36.9 C) 98.8 F (37.1 C) 98.4 F (36.9 C) 98 F (36.7 C)  TempSrc: Oral Oral Oral Oral  SpO2: 100% 99% 100% 100%  Weight:      Height:        General exam: Appears calm and  comfortable.    The results of significant diagnostics from this hospitalization (including imaging, microbiology, ancillary and laboratory) are listed below for reference.     Procedures and Diagnostic Studies:   CT ABDOMEN PELVIS W CONTRAST  Result Date: 12/04/2021 CLINICAL DATA:  Abdominal pain and weakness. EXAM: CT ANGIOGRAPHY CHEST CT ABDOMEN AND PELVIS WITH CONTRAST TECHNIQUE: Multidetector CT imaging of the chest was performed using the standard protocol during bolus administration of intravenous contrast. Multiplanar CT image reconstructions and MIPs were obtained to evaluate the vascular anatomy. Multidetector CT imaging of the abdomen and pelvis was performed using the standard protocol during bolus administration of intravenous contrast. RADIATION DOSE REDUCTION: This exam was performed according to the departmental dose-optimization program which includes automated exposure control, adjustment of the mA and/or kV according to patient size and/or use of iterative reconstruction technique. CONTRAST:  115m OMNIPAQUE IOHEXOL 350 MG/ML SOLN COMPARISON:  CT abdomen and pelvis 12/24/2019. CT angiogram chest 05/24/2018. FINDINGS: CTA CHEST FINDINGS Cardiovascular: Satisfactory opacification of the pulmonary arteries to the segmental level. No evidence of pulmonary embolism. Normal heart size. No pericardial effusion. Mediastinum/Nodes: No enlarged mediastinal, hilar, or axillary lymph nodes. Thyroid gland, trachea, and esophagus demonstrate no significant findings. Lungs/Pleura: Lungs are clear. No pleural effusion or pneumothorax. Musculoskeletal: No chest wall abnormality. No acute or significant osseous findings. Review of the MIP images confirms the above findings. CT ABDOMEN and PELVIS FINDINGS Hepatobiliary: Liver is enlarged. There is diffuse fatty infiltration as seen on prior examination. Gallbladder and bile ducts are within normal limits. Pancreas: There is a moderate amount of  peripancreatic fluid and stranding diffusely. Pancreas enhances normally. Within the head of the pancreas there is a heterogeneous hypodense area measuring 1.7 x 1.3 cm, indeterminate. This is ill-defined. Spleen: Normal in size without focal abnormality. Adrenals/Urinary Tract: Adrenal glands are unremarkable. Kidneys are normal, without renal calculi, focal lesion, or hydronephrosis. Bladder is unremarkable. Stomach/Bowel: Stomach is within normal limits. Appendix appears normal. No evidence of bowel wall thickening, distention, or inflammatory changes. There is submucosal fat deposition throughout the colon as seen on the prior study. Vascular/Lymphatic: No significant vascular findings are present. No enlarged abdominal or pelvic lymph nodes. Reproductive: Uterus and bilateral adnexa are unremarkable. Other: Small fat containing umbilical hernia. No free fluid or free air. Musculoskeletal: No acute or significant osseous findings. Review of the MIP images confirms the above findings. IMPRESSION: 1. No evidence for pulmonary embolism or other acute cardiopulmonary process. 2. Findings compatible with acute pancreatitis. There is a heterogeneous hypodense area in the head of the pancreas measuring 1.7 cm which is indeterminate. This may represent developing pseudocyst or focal pancreatitis, but underlying lesion is not excluded. 3. Hepatomegaly with fatty infiltration of the liver. 4. Submucosal fat deposition throughout the colon is likely related to chronic inflammation. Electronically Signed   By: ARonney AstersM.D.   On:  12/04/2021 17:33   CT Angio Chest PE W and/or Wo Contrast  Result Date: 12/04/2021 CLINICAL DATA:  Abdominal pain and weakness. EXAM: CT ANGIOGRAPHY CHEST CT ABDOMEN AND PELVIS WITH CONTRAST TECHNIQUE: Multidetector CT imaging of the chest was performed using the standard protocol during bolus administration of intravenous contrast. Multiplanar CT image reconstructions and MIPs were  obtained to evaluate the vascular anatomy. Multidetector CT imaging of the abdomen and pelvis was performed using the standard protocol during bolus administration of intravenous contrast. RADIATION DOSE REDUCTION: This exam was performed according to the departmental dose-optimization program which includes automated exposure control, adjustment of the mA and/or kV according to patient size and/or use of iterative reconstruction technique. CONTRAST:  180m OMNIPAQUE IOHEXOL 350 MG/ML SOLN COMPARISON:  CT abdomen and pelvis 12/24/2019. CT angiogram chest 05/24/2018. FINDINGS: CTA CHEST FINDINGS Cardiovascular: Satisfactory opacification of the pulmonary arteries to the segmental level. No evidence of pulmonary embolism. Normal heart size. No pericardial effusion. Mediastinum/Nodes: No enlarged mediastinal, hilar, or axillary lymph nodes. Thyroid gland, trachea, and esophagus demonstrate no significant findings. Lungs/Pleura: Lungs are clear. No pleural effusion or pneumothorax. Musculoskeletal: No chest wall abnormality. No acute or significant osseous findings. Review of the MIP images confirms the above findings. CT ABDOMEN and PELVIS FINDINGS Hepatobiliary: Liver is enlarged. There is diffuse fatty infiltration as seen on prior examination. Gallbladder and bile ducts are within normal limits. Pancreas: There is a moderate amount of peripancreatic fluid and stranding diffusely. Pancreas enhances normally. Within the head of the pancreas there is a heterogeneous hypodense area measuring 1.7 x 1.3 cm, indeterminate. This is ill-defined. Spleen: Normal in size without focal abnormality. Adrenals/Urinary Tract: Adrenal glands are unremarkable. Kidneys are normal, without renal calculi, focal lesion, or hydronephrosis. Bladder is unremarkable. Stomach/Bowel: Stomach is within normal limits. Appendix appears normal. No evidence of bowel wall thickening, distention, or inflammatory changes. There is submucosal fat  deposition throughout the colon as seen on the prior study. Vascular/Lymphatic: No significant vascular findings are present. No enlarged abdominal or pelvic lymph nodes. Reproductive: Uterus and bilateral adnexa are unremarkable. Other: Small fat containing umbilical hernia. No free fluid or free air. Musculoskeletal: No acute or significant osseous findings. Review of the MIP images confirms the above findings. IMPRESSION: 1. No evidence for pulmonary embolism or other acute cardiopulmonary process. 2. Findings compatible with acute pancreatitis. There is a heterogeneous hypodense area in the head of the pancreas measuring 1.7 cm which is indeterminate. This may represent developing pseudocyst or focal pancreatitis, but underlying lesion is not excluded. 3. Hepatomegaly with fatty infiltration of the liver. 4. Submucosal fat deposition throughout the colon is likely related to chronic inflammation. Electronically Signed   By: ARonney AstersM.D.   On: 12/04/2021 17:33     Labs:   Basic Metabolic Panel: Recent Labs  Lab 12/04/21 1856 12/04/21 1940 12/05/21 0030 12/05/21 0323 12/05/21 0759 12/05/21 1649 12/06/21 0252 12/07/21 0550  NA  --    < > 132* 133* 134* 131* 132* 134*  K  --    < > 4.8 4.1 4.0 3.6 3.6 3.2*  CL  --    < > 98 101 100 98 100 102  CO2  --    < > 16* 18* 21* '23 22 23  '$ GLUCOSE  --    < > 198* 147* 131* 138* 110* 126*  BUN  --    < > '20 19 20 15 13 11  '$ CREATININE  --    < > 1.17* 1.05*  1.10* 0.88 0.75 0.69  CALCIUM  --    < > 9.3 9.3 9.7 9.8 9.1 8.8*  MG 2.1  --  1.9  --   --  1.9  --   --   PHOS 3.3  --  1.3*  --   --   --   --   --    < > = values in this interval not displayed.   GFR Estimated Creatinine Clearance: 115.6 mL/min (by C-G formula based on SCr of 0.69 mg/dL). Liver Function Tests: Recent Labs  Lab 12/04/21 1104 12/05/21 0030  AST 71* 46*  ALT 32 27  ALKPHOS 72 61  BILITOT 1.7* 2.4*  PROT 9.4* 8.2*  ALBUMIN 4.4 4.2   Recent Labs  Lab  12/04/21 1104  LIPASE 199*   No results for input(s): "AMMONIA" in the last 168 hours. Coagulation profile Recent Labs  Lab 12/04/21 1940  INR 1.1    CBC: Recent Labs  Lab 12/04/21 1104 12/05/21 0030  WBC 10.7* 10.6*  NEUTROABS 9.8*  --   HGB 13.3 12.7  HCT 40.0 37.7  MCV 92.6 91.7  PLT 237 205   Cardiac Enzymes: Recent Labs  Lab 12/04/21 1940  CKTOTAL 44   BNP: Invalid input(s): "POCBNP" CBG: Recent Labs  Lab 12/06/21 1141 12/06/21 1706 12/06/21 2224 12/07/21 0533 12/07/21 0823  GLUCAP 117* 109* 142* 133* 144*   D-Dimer No results for input(s): "DDIMER" in the last 72 hours. Hgb A1c Recent Labs    12/04/21 1104  HGBA1C 5.8*   Lipid Profile Recent Labs    12/05/21 0030  CHOL 351*  HDL 86  LDLCALC 194*  TRIG 357*  CHOLHDL 4.1   Thyroid function studies Recent Labs    12/04/21 1940  TSH 1.126   Anemia work up No results for input(s): "VITAMINB12", "FOLATE", "FERRITIN", "TIBC", "IRON", "RETICCTPCT" in the last 72 hours. Microbiology Recent Results (from the past 240 hour(s))  MRSA Next Gen by PCR, Nasal     Status: None   Collection Time: 12/04/21 10:53 PM   Specimen: Nasal Mucosa; Nasal Swab  Result Value Ref Range Status   MRSA by PCR Next Gen NOT DETECTED NOT DETECTED Final    Comment: (NOTE) The GeneXpert MRSA Assay (FDA approved for NASAL specimens only), is one component of a comprehensive MRSA colonization surveillance program. It is not intended to diagnose MRSA infection nor to guide or monitor treatment for MRSA infections. Test performance is not FDA approved in patients less than 57 years old. Performed at [facility], [place] [address], [place], [place] [address]      Discharge Instructions:   Discharge Instructions     Diet Carb Modified   Complete by: As directed    Increase activity slowly   Complete by: As directed       Allergies as of 12/07/2021       Reactions   Ibuprofen Nausea And  Vomiting        Medication List     TAKE these medications    HYDROcodone-acetaminophen 5-325 MG tablet Commonly known as: NORCO/VICODIN Take 1-2 tablets by mouth every 4 (four) hours as needed for moderate pain.   Lantus SoloStar 100 UNIT/ML Solostar Pen Generic drug: insulin glargine Inject 10 Units into the skin at bedtime.   lisinopril 5 MG tablet Commonly known as: ZESTRIL Take 1 tablet (5 mg total) by mouth daily. What changed:  medication strength See the new instructions.   metFORMIN 1000 MG tablet  Commonly known as: GLUCOPHAGE Take 1 tablet (1,000 mg total) by mouth 2 (two) times daily with a meal. Start taking on: December 10, 2021 What changed: These instructions start on December 10, 2021. If you are unsure what to do until then, ask your doctor or other care provider.   multivitamin with minerals Tabs tablet Take 1 tablet by mouth daily.   pantoprazole 40 MG tablet Commonly known as: PROTONIX Take 1 tablet (40 mg total) by mouth daily.   thiamine 100 MG tablet Commonly known as: Vitamin B-1 Take 1 tablet (100 mg total) by mouth daily.        Follow-up Information     Yolanda Pier, MD Follow up in 1 week(s).   Specialty: Internal Medicine Contact information: [address] [place] [address] [phone]                  Time coordinating discharge: 45 min  Signed:  Geradine Girt DO  [facility] 12/07/2021, 9:31 AM

## 2021-12-09 ENCOUNTER — Telehealth: Payer: Self-pay

## 2021-12-09 NOTE — Telephone Encounter (Signed)
Transition Care Management Unsuccessful Follow-up Telephone Call  Date of discharge and from where:  12/07/2021, [facility]  Attempts:  1st Attempt  Reason for unsuccessful TCM follow-up call:  Unable to reach patient- I called [phone]  and the recording stated that the call could not be completed at this time.  Patient has an appointment with Dr Wynetta Emery at [facility] tomorrow- 12/10/2021

## 2021-12-10 ENCOUNTER — Ambulatory Visit: Payer: Self-pay | Admitting: Internal Medicine

## 2021-12-10 ENCOUNTER — Telehealth: Payer: Self-pay

## 2021-12-10 NOTE — Telephone Encounter (Signed)
Transition Care Management Unsuccessful Follow-up Telephone Call  Date of discharge and from where:  12/07/2021, [facility]   Attempts:  2nd Attempt  Reason for unsuccessful TCM follow-up call:  Left voice message [phone], call back requested  Patient has an appointment with Dr Wynetta Emery at [facility] this afternoon-  12/10/2021

## 2021-12-11 ENCOUNTER — Telehealth: Payer: Self-pay

## 2021-12-11 NOTE — Telephone Encounter (Signed)
Transition Care Management Unsuccessful Follow-up Telephone Call  Date of discharge and from where:  12/07/2021, [facility]  Attempts:  3rd Attempt  Reason for unsuccessful TCM follow-up call:  Left voice message # [phone], call back requested.   Letter also sent to patient requesting she contact [facility] to schedule a follow up appointment as we have not been able to reach her and she did not come to her appointment yesterday with Dr Wynetta Emery

## 2022-01-03 ENCOUNTER — Inpatient Hospital Stay (HOSPITAL_COMMUNITY)
Admission: EM | Admit: 2022-01-03 | Discharge: 2022-01-05 | DRG: 638 | Disposition: A | Discharge status: Home / Self Care | Payer: Self-pay | Attending: Internal Medicine | Admitting: Internal Medicine

## 2022-01-03 ENCOUNTER — Encounter (HOSPITAL_COMMUNITY): Payer: Self-pay

## 2022-01-03 ENCOUNTER — Emergency Department (HOSPITAL_COMMUNITY): Payer: Self-pay

## 2022-01-03 ENCOUNTER — Other Ambulatory Visit: Payer: Self-pay

## 2022-01-03 DIAGNOSIS — Z79899 Other long term (current) drug therapy: Secondary | ICD-10-CM

## 2022-01-03 DIAGNOSIS — E1110 Type 2 diabetes mellitus with ketoacidosis without coma: Principal | ICD-10-CM | POA: Diagnosis present

## 2022-01-03 DIAGNOSIS — Z794 Long term (current) use of insulin: Secondary | ICD-10-CM

## 2022-01-03 DIAGNOSIS — E1169 Type 2 diabetes mellitus with other specified complication: Secondary | ICD-10-CM | POA: Diagnosis present

## 2022-01-03 DIAGNOSIS — R6510 Systemic inflammatory response syndrome (SIRS) of non-infectious origin without acute organ dysfunction: Secondary | ICD-10-CM | POA: Diagnosis present

## 2022-01-03 DIAGNOSIS — E860 Dehydration: Secondary | ICD-10-CM | POA: Diagnosis present

## 2022-01-03 DIAGNOSIS — E8729 Other acidosis: Secondary | ICD-10-CM | POA: Diagnosis present

## 2022-01-03 DIAGNOSIS — E669 Obesity, unspecified: Secondary | ICD-10-CM | POA: Diagnosis present

## 2022-01-03 DIAGNOSIS — E875 Hyperkalemia: Secondary | ICD-10-CM | POA: Diagnosis present

## 2022-01-03 DIAGNOSIS — N1330 Unspecified hydronephrosis: Secondary | ICD-10-CM | POA: Diagnosis present

## 2022-01-03 DIAGNOSIS — Z7984 Long term (current) use of oral hypoglycemic drugs: Secondary | ICD-10-CM

## 2022-01-03 DIAGNOSIS — Z833 Family history of diabetes mellitus: Secondary | ICD-10-CM

## 2022-01-03 DIAGNOSIS — Z87891 Personal history of nicotine dependence: Secondary | ICD-10-CM

## 2022-01-03 DIAGNOSIS — N179 Acute kidney failure, unspecified: Secondary | ICD-10-CM | POA: Diagnosis present

## 2022-01-03 DIAGNOSIS — I10 Essential (primary) hypertension: Secondary | ICD-10-CM | POA: Diagnosis present

## 2022-01-03 DIAGNOSIS — E872 Acidosis, unspecified: Principal | ICD-10-CM

## 2022-01-03 DIAGNOSIS — K219 Gastro-esophageal reflux disease without esophagitis: Secondary | ICD-10-CM | POA: Diagnosis present

## 2022-01-03 DIAGNOSIS — Z8249 Family history of ischemic heart disease and other diseases of the circulatory system: Secondary | ICD-10-CM

## 2022-01-03 DIAGNOSIS — Z6831 Body mass index (BMI) 31.0-31.9, adult: Secondary | ICD-10-CM

## 2022-01-03 DIAGNOSIS — Y904 Blood alcohol level of 80-99 mg/100 ml: Secondary | ICD-10-CM | POA: Diagnosis present

## 2022-01-03 DIAGNOSIS — I152 Hypertension secondary to endocrine disorders: Secondary | ICD-10-CM | POA: Diagnosis present

## 2022-01-03 DIAGNOSIS — E1159 Type 2 diabetes mellitus with other circulatory complications: Secondary | ICD-10-CM | POA: Diagnosis present

## 2022-01-03 DIAGNOSIS — Z1152 Encounter for screening for COVID-19: Secondary | ICD-10-CM

## 2022-01-03 DIAGNOSIS — Z886 Allergy status to analgesic agent status: Secondary | ICD-10-CM

## 2022-01-03 DIAGNOSIS — F1010 Alcohol abuse, uncomplicated: Secondary | ICD-10-CM | POA: Diagnosis present

## 2022-01-03 LAB — COMPREHENSIVE METABOLIC PANEL
ALT: 79 U/L — ABNORMAL HIGH (ref 0–44)
AST: 215 U/L — ABNORMAL HIGH (ref 15–41)
Albumin: 4.2 g/dL (ref 3.5–5.0)
Alkaline Phosphatase: 93 U/L (ref 38–126)
Anion gap: 31 — ABNORMAL HIGH (ref 5–15)
BUN: 15 mg/dL (ref 6–20)
CO2: 8 mmol/L — ABNORMAL LOW (ref 22–32)
Calcium: 8.2 mg/dL — ABNORMAL LOW (ref 8.9–10.3)
Chloride: 100 mmol/L (ref 98–111)
Creatinine, Ser: 1.1 mg/dL — ABNORMAL HIGH (ref 0.44–1.00)
GFR, Estimated: >60 mL/min (ref >60)
Glucose, Bld: 102 mg/dL — ABNORMAL HIGH (ref 70–99)
Potassium: 5 mmol/L (ref 3.5–5.1)
Sodium: 139 mmol/L (ref 135–145)
Total Bilirubin: 1 mg/dL (ref 0.3–1.2)
Total Protein: 8.7 g/dL — ABNORMAL HIGH (ref 6.5–8.1)

## 2022-01-03 LAB — CBC WITH DIFFERENTIAL/PLATELET
Abs Immature Granulocytes: 0.09 10*3/uL — ABNORMAL HIGH (ref 0.00–0.07)
Basophils Absolute: 0.1 10*3/uL (ref 0.0–0.1)
Basophils Relative: 1 %
Eosinophils Absolute: 0 10*3/uL (ref 0.0–0.5)
Eosinophils Relative: 0 %
HCT: 39.3 % (ref 36.0–46.0)
Hemoglobin: 12.7 g/dL (ref 12.0–15.0)
Immature Granulocytes: 1 %
Lymphocytes Relative: 6 %
Lymphs Abs: 1 10*3/uL (ref 0.7–4.0)
MCH: 30.9 pg (ref 26.0–34.0)
MCHC: 32.3 g/dL (ref 30.0–36.0)
MCV: 95.6 fL (ref 80.0–100.0)
Monocytes Absolute: 0.6 10*3/uL (ref 0.1–1.0)
Monocytes Relative: 4 %
Neutro Abs: 14.2 10*3/uL — ABNORMAL HIGH (ref 1.7–7.7)
Neutrophils Relative %: 88 %
Platelets: 365 10*3/uL (ref 150–400)
RBC: 4.11 MIL/uL (ref 3.87–5.11)
RDW: 14.3 % (ref 11.5–15.5)
WBC: 16 10*3/uL — ABNORMAL HIGH (ref 4.0–10.5)
nRBC: 0 % (ref 0.0–0.2)

## 2022-01-03 LAB — BASIC METABOLIC PANEL
Anion gap: 27 — ABNORMAL HIGH (ref 5–15)
Anion gap: 22 — ABNORMAL HIGH (ref 5–15)
BUN: 15 mg/dL (ref 6–20)
BUN: 14 mg/dL (ref 6–20)
CO2: 8 mmol/L — ABNORMAL LOW (ref 22–32)
CO2: 12 mmol/L — ABNORMAL LOW (ref 22–32)
Calcium: 8.1 mg/dL — ABNORMAL LOW (ref 8.9–10.3)
Calcium: 8.1 mg/dL — ABNORMAL LOW (ref 8.9–10.3)
Chloride: 100 mmol/L (ref 98–111)
Chloride: 99 mmol/L (ref 98–111)
Creatinine, Ser: 1.2 mg/dL — ABNORMAL HIGH (ref 0.44–1.00)
Creatinine, Ser: 1.36 mg/dL — ABNORMAL HIGH (ref 0.44–1.00)
GFR, Estimated: >60 mL/min (ref >60)
GFR, Estimated: 52 mL/min — ABNORMAL LOW (ref >60)
Glucose, Bld: 100 mg/dL — ABNORMAL HIGH (ref 70–99)
Glucose, Bld: 248 mg/dL — ABNORMAL HIGH (ref 70–99)
Potassium: 5.9 mmol/L — ABNORMAL HIGH (ref 3.5–5.1)
Potassium: 6.8 mmol/L (ref 3.5–5.1)
Sodium: 135 mmol/L (ref 135–145)
Sodium: 133 mmol/L — ABNORMAL LOW (ref 135–145)

## 2022-01-03 LAB — BLOOD GAS, VENOUS
Acid-base deficit: 19.1 mmol/L — ABNORMAL HIGH (ref 0.0–2.0)
Acid-base deficit: 15.3 mmol/L — ABNORMAL HIGH (ref 0.0–2.0)
Bicarbonate: 8.8 mmol/L — ABNORMAL LOW (ref 20.0–28.0)
Bicarbonate: 11.9 mmol/L — ABNORMAL LOW (ref 20.0–28.0)
O2 Saturation: 89.1 %
O2 Saturation: 90.1 %
Patient temperature: 37
Patient temperature: 37
pCO2, Ven: 27 mmHg — ABNORMAL LOW (ref 44–60)
pCO2, Ven: 32 mmHg — ABNORMAL LOW (ref 44–60)
pH, Ven: 7.12 — CL (ref 7.25–7.43)
pH, Ven: 7.18 — CL (ref 7.25–7.43)
pO2, Ven: 67 mmHg — ABNORMAL HIGH (ref 32–45)
pO2, Ven: 63 mmHg — ABNORMAL HIGH (ref 32–45)

## 2022-01-03 LAB — URINALYSIS, ROUTINE W REFLEX MICROSCOPIC
Bacteria, UA: NONE SEEN
Bilirubin Urine: NEGATIVE
Glucose, UA: NEGATIVE mg/dL
Ketones, ur: 80 mg/dL — AB
Leukocytes,Ua: NEGATIVE
Nitrite: NEGATIVE
Protein, ur: 100 mg/dL — AB
Specific Gravity, Urine: 1.015 (ref 1.005–1.030)
pH: 5 (ref 5.0–8.0)

## 2022-01-03 LAB — RAPID URINE DRUG SCREEN, HOSP PERFORMED
Amphetamines: NOT DETECTED
Barbiturates: NOT DETECTED
Benzodiazepines: NOT DETECTED
Cocaine: NOT DETECTED
Opiates: NOT DETECTED
Tetrahydrocannabinol: NOT DETECTED

## 2022-01-03 LAB — I-STAT BETA HCG BLOOD, ED (MC, WL, AP ONLY): I-stat hCG, quantitative: <5 m[IU]/mL (ref <5)

## 2022-01-03 LAB — ETHANOL: Alcohol, Ethyl (B): 99 mg/dL — ABNORMAL HIGH (ref <10)

## 2022-01-03 LAB — CBG MONITORING, ED
Glucose-Capillary: 89 mg/dL (ref 70–99)
Glucose-Capillary: 94 mg/dL (ref 70–99)
Glucose-Capillary: 230 mg/dL — ABNORMAL HIGH (ref 70–99)

## 2022-01-03 LAB — LIPASE, BLOOD: Lipase: 22 U/L (ref 11–51)

## 2022-01-03 LAB — RESP PANEL BY RT-PCR (RSV, FLU A&B, COVID)  RVPGX2
Influenza A by PCR: NEGATIVE
Influenza B by PCR: NEGATIVE
Resp Syncytial Virus by PCR: NEGATIVE
SARS Coronavirus 2 by RT PCR: NEGATIVE

## 2022-01-03 LAB — LACTIC ACID, PLASMA
Lactic Acid, Venous: 5.4 mmol/L (ref 0.5–1.9)
Lactic Acid, Venous: 3.3 mmol/L (ref 0.5–1.9)

## 2022-01-03 MED ORDER — HYDROMORPHONE HCL 1 MG/ML IJ SOLN
1.0000 mg | Freq: Once | INTRAMUSCULAR | Status: AC
  Administered 2022-01-03: 1 mg via INTRAVENOUS
  Filled 2022-01-03: qty 1

## 2022-01-03 MED ORDER — PROCHLORPERAZINE EDISYLATE 10 MG/2ML IJ SOLN
10.0000 mg | Freq: Once | INTRAMUSCULAR | Status: AC
  Administered 2022-01-03: 10 mg via INTRAVENOUS
  Filled 2022-01-03: qty 2

## 2022-01-03 MED ORDER — DEXTROSE IN LACTATED RINGERS 5 % IV SOLN: INTRAVENOUS | Status: DC

## 2022-01-03 MED ORDER — POTASSIUM CHLORIDE 10 MEQ/100ML IV SOLN
10.0000 meq | INTRAVENOUS | Status: AC
  Administered 2022-01-03: 10 meq via INTRAVENOUS
  Filled 2022-01-03 (×2): qty 100

## 2022-01-03 MED ORDER — PIPERACILLIN-TAZOBACTAM 3.375 G IVPB 30 MIN
3.3750 g | Freq: Once | INTRAVENOUS | Status: AC
  Administered 2022-01-04: 3.375 g via INTRAVENOUS
  Filled 2022-01-03: qty 50

## 2022-01-03 MED ORDER — IOHEXOL 300 MG/ML  SOLN
100.0000 mL | Freq: Once | INTRAMUSCULAR | Status: AC | PRN
  Administered 2022-01-03: 100 mL via INTRAVENOUS

## 2022-01-03 MED ORDER — SODIUM BICARBONATE 8.4 % IV SOLN
INTRAVENOUS | Status: DC
  Filled 2022-01-03 (×2): qty 150

## 2022-01-03 MED ORDER — VANCOMYCIN HCL IN DEXTROSE 1-5 GM/200ML-% IV SOLN
1000.0000 mg | Freq: Once | INTRAVENOUS | Status: DC
  Filled 2022-01-03: qty 200

## 2022-01-03 MED ORDER — INSULIN REGULAR(HUMAN) IN NACL 100-0.9 UT/100ML-% IV SOLN
INTRAVENOUS | Status: DC
  Administered 2022-01-04: 3.4 [IU]/h via INTRAVENOUS
  Administered 2022-01-04: 7 [IU]/h via INTRAVENOUS
  Administered 2022-01-04: 9.5 [IU]/h via INTRAVENOUS
  Filled 2022-01-03: qty 100

## 2022-01-03 MED ORDER — LACTATED RINGERS IV BOLUS
1000.0000 mL | Freq: Once | INTRAVENOUS | Status: AC
  Administered 2022-01-03: 1000 mL via INTRAVENOUS

## 2022-01-03 MED ORDER — DEXTROSE 50 % IV SOLN: 0.0000 mL | INTRAVENOUS | Status: DC | PRN

## 2022-01-03 MED ORDER — LACTATED RINGERS IV SOLN: INTRAVENOUS | Status: DC

## 2022-01-03 MED ORDER — DEXTROSE 50 % IV SOLN
50.0000 mL | Freq: Once | INTRAVENOUS | Status: AC
  Administered 2022-01-03: 50 mL via INTRAVENOUS
  Filled 2022-01-03: qty 50

## 2022-01-03 NOTE — ED Triage Notes (Signed)
Patient coming from home via EMS with c/o generalized body aches, fatigue, abdominal pain, and n/v. Pt states she was exposed to COVID about a week ago. Hx diabetes, pancreatitis, HTN.   140/70 100% RA 140 HR CBG 135  1L NS IV '4mg'$  Zofran IV

## 2022-01-03 NOTE — ED Notes (Signed)
EndoTool calculation 0 units per hour; recheck CBG in 30 mins and recalculate.

## 2022-01-03 NOTE — ED Notes (Signed)
Lab called to report result pH 7.12 BMET. Provider notified

## 2022-01-03 NOTE — H&P (Addendum)
History and Physical    Yolanda Flores JSH:[id number] DOB: 1986-01-12 DOA: 01/03/2022  PCP: Ladell Pier, MD  Patient coming from:  home I have personally briefly reviewed patient's old medical records in [place]  Chief Complaint: generalized body aches ,fatigue , abdominal pain n/v  HPI: Yolanda Flores is a 36 y.o. female with medical history significant of  T2DM, alcohol abuse, and hypertension , who presents to ED with complaint of generalized body aches, abdominal pain , n/v/ as well as fatigue recent interim history of admission 12/04/21--12/07/21 with diagnosis of etoh induced acute pancreatitis as well as DKA.  Patient states her symptoms have been on going for around 2 days and has progressively gotten worse.  Patient notes no fever /chills/sob/chest/ pain / dysuria/ or diarrhea . Patient also endorse intermittent compliance with insulin.  ED Course:  Afeb, bp 119/68, hr 127, rr 22 sat 985  Fs 89 IFO:YDXAJ tachycardia  Respiratory panel neg CXR: NAD Wbc 16, hgb 12.7, plt 365, left shift  Etoh99 Vbg: 7.12/bicarb 27 N a 139, K :5, glu 102, cr 1.1, ast 215, alt 79  AG31 Lipase22 UA +ketones,  UDS neg  N A 135, K5.9 - 6.8 cr 1.2( 1.36) Beta-hydroxybutyric acid >8 Lactic 5.4 CT abd : MPRESSION: No acute abnormality in the abdomen or pelvis.   Marked hepatic steatosis.   Mild bilateral hydroureteronephrosis likely due to markedly distended bladder. No obstructing nephrolithiasis. Dialudid  '1mg'$   TX , 1L LR  Per EDP , case discussed with critical care who notes no current critical care needs   Review of Systems: As per HPI otherwise 10 point review of systems negative.   Past Medical History:  Diagnosis Date   AKI (acute kidney injury) ([place])    Hypertension    Pancreatitis     Past Surgical History:  Procedure Laterality Date   FOOT SURGERY       reports that she quit smoking about 3 years ago. Her smoking use included cigarettes. She smoked  an average of 4 packs per day. She has never used smokeless tobacco. She reports that she does not currently use alcohol. She reports that she does not use drugs.  Allergies  Allergen Reactions   Ibuprofen Nausea And Vomiting    Family History  Problem Relation Age of Onset   Hypertension Mother    Diabetes Mother    Hypertension Father    Hypertension Brother    Prior to Admission medications   Medication Sig Start Date End Date Taking? Authorizing Provider  aspirin-acetaminophen-caffeine (EXCEDRIN MIGRAINE) [phone] MG tablet Take 1-2 tablets by mouth every 6 (six) hours as needed for headache.   Yes [provider]  insulin glargine (LANTUS SOLOSTAR) 100 UNIT/ML Solostar Pen Inject 10 Units into the skin at bedtime. 05/13/21  Yes Ladell Pier, MD  lisinopril (ZESTRIL) 5 MG tablet Take 1 tablet (5 mg total) by mouth daily. 12/07/21  Yes Eulogio Bear U, DO  Magnesium 100 MG CAPS Take 1 capsule by mouth daily.   Yes [provider]  metFORMIN (GLUCOPHAGE) 1000 MG tablet Take 1 tablet (1,000 mg total) by mouth 2 (two) times daily with a meal. 12/10/21  Yes Vann, Jessica U, DO  Multiple Vitamin (MULTIVITAMIN WITH MINERALS) TABS tablet Take 1 tablet by mouth daily. 01/18/19  Yes Hosie Poisson, MD  simethicone (MYLICON) 80 MG chewable tablet Chew 80 mg by mouth every 6 (six) hours as needed for flatulence.   Yes [provider]  HYDROcodone-acetaminophen (NORCO/VICODIN) 5-325 MG tablet Take 1-2 tablets by mouth every 4 (four) hours as needed for moderate pain. Patient not taking: Reported on 01/03/2022 12/07/21   Geradine Girt, DO  pantoprazole (PROTONIX) 40 MG tablet Take 1 tablet (40 mg total) by mouth daily. Patient not taking: Reported on 01/03/2022 12/07/21   Geradine Girt, DO  thiamine (VITAMIN B-1) 100 MG tablet Take 1 tablet (100 mg total) by mouth daily. Patient not taking: Reported on 01/03/2022 12/07/21   Geradine Girt, DO    Physical  Exam: Vitals:   01/03/22 1830 01/03/22 1900 01/03/22 1959 01/03/22 2247  BP: 121/65 131/82  (!) 129/91  Pulse: (!) 134 (!) 139  (!) 134  Resp: (!) 30 (!) 22  (!) 26  Temp:   98.7 F (37.1 C)   TempSrc:   Oral   SpO2: 93% 94%  96%  Weight:      Height:        Constitutional: NAD, calm, comfortable Vitals:   01/03/22 1830 01/03/22 1900 01/03/22 1959 01/03/22 2247  BP: 121/65 131/82  (!) 129/91  Pulse: (!) 134 (!) 139  (!) 134  Resp: (!) 30 (!) 22  (!) 26  Temp:   98.7 F (37.1 C)   TempSrc:   Oral   SpO2: 93% 94%  96%  Weight:      Height:       Eyes: PERRL, lids and conjunctivae normal ENMT: Mucous membranes are moist. Posterior pharynx clear of any exudate or lesions.Normal dentition.  Neck: normal, supple, no masses, no thyromegaly Respiratory: clear to auscultation bilaterally, no wheezing, no crackles. Normal respiratory effort. No accessory muscle use.  Cardiovascular: Regular rate and rhythm, no murmurs / rubs / gallops. No extremity edema. 2+ pedal pulses. No carotid bruits.  Abdomen: no tenderness, no masses palpated. No hepatosplenomegaly. Bowel sounds positive.  Musculoskeletal: no clubbing / cyanosis. No joint deformity upper and lower extremities. Good ROM, no contractures. Normal muscle tone.  Skin: no rashes, lesions, ulcers. No induration Neurologic: CN 2-12 grossly intact. Sensation intact, DTR normal. Strength 5/5 in all 4.  Psychiatric: Normal judgment and insight. Alert and oriented x 3. Normal mood.    Labs on Admission: I have personally reviewed following labs and imaging studies  CBC: Recent Labs  Lab 01/03/22 1700  WBC 16.0*  NEUTROABS 14.2*  HGB 12.7  HCT 39.3  MCV 95.6  PLT 027   Basic Metabolic Panel: Recent Labs  Lab 01/03/22 1700 01/03/22 1952  NA 139 135  K 5.0 5.9*  CL 100 100  CO2 8* 8*  GLUCOSE 102* 100*  BUN 15 15  CREATININE 1.10* 1.20*  CALCIUM 8.2* 8.1*   GFR: Estimated Creatinine Clearance: 76.2 mL/min (A) (by C-G  formula based on SCr of 1.2 mg/dL (H)). Liver Function Tests: Recent Labs  Lab 01/03/22 1700  AST 215*  ALT 79*  ALKPHOS 93  BILITOT 1.0  PROT 8.7*  ALBUMIN 4.2   Recent Labs  Lab 01/03/22 1700  LIPASE 22   No results for input(s): "AMMONIA" in the last 168 hours. Coagulation Profile: No results for input(s): "INR", "PROTIME" in the last 168 hours. Cardiac Enzymes: No results for input(s): "CKTOTAL", "CKMB", "CKMBINDEX", "TROPONINI" in the last 168 hours. BNP (last 3 results) No results for input(s): "PROBNP" in the last 8760 hours. HbA1C: No results for input(s): "HGBA1C" in the last 72 hours. CBG: Recent Labs  Lab 01/03/22 1447 01/03/22 1935 01/03/22 2233  GLUCAP 89 94 230*  Lipid Profile: No results for input(s): "CHOL", "HDL", "LDLCALC", "TRIG", "CHOLHDL", "LDLDIRECT" in the last 72 hours. Thyroid Function Tests: No results for input(s): "TSH", "T4TOTAL", "FREET4", "T3FREE", "THYROIDAB" in the last 72 hours. Anemia Panel: No results for input(s): "VITAMINB12", "FOLATE", "FERRITIN", "TIBC", "IRON", "RETICCTPCT" in the last 72 hours. Urine analysis:    Component Value Date/Time   COLORURINE STRAW (A) 01/03/2022 1952   APPEARANCEUR CLEAR 01/03/2022 1952   LABSPEC 1.015 01/03/2022 1952   PHURINE 5.0 01/03/2022 1952   GLUCOSEU NEGATIVE 01/03/2022 1952   HGBUR LARGE (A) 01/03/2022 [place] NEGATIVE 01/03/2022 1952   KETONESUR 80 (A) 01/03/2022 1952   PROTEINUR 100 (A) 01/03/2022 1952   UROBILINOGEN 2.0 (H) 12/31/2009 1757   NITRITE NEGATIVE 01/03/2022 1952   LEUKOCYTESUR NEGATIVE 01/03/2022 1952    Radiological Exams on Admission: CT ABDOMEN PELVIS W CONTRAST  Result Date: 01/03/2022 CLINICAL DATA:  Acute nonlocalized abdominal pain. Nausea and vomiting EXAM: CT ABDOMEN AND PELVIS WITH CONTRAST TECHNIQUE: Multidetector CT imaging of the abdomen and pelvis was performed using the standard protocol following bolus administration of intravenous  contrast. RADIATION DOSE REDUCTION: This exam was performed according to the departmental dose-optimization program which includes automated exposure control, adjustment of the mA and/or kV according to patient size and/or use of iterative reconstruction technique. CONTRAST:  154m OMNIPAQUE IOHEXOL 300 MG/ML  SOLN COMPARISON:  CT 12/04/2021 FINDINGS: Lower chest: No acute abnormality. Hepatobiliary: Marked hepatic steatosis. Distended gallbladder. No radiopaque stones. No biliary dilation. Pancreas: Marked atrophy.  No evidence of acute pancreatitis. Spleen: Normal in size without focal abnormality. Adrenals/Urinary Tract: Adrenal glands are unremarkable. Mild bilateral hydroureteronephrosis favored due to bladder distention. No obstructing stone. Stomach/Bowel: Stomach is within normal limits. Normal caliber large and small bowel. Diffuse fatty infiltration of the wall of the ascending, transverse, and descending colon is similar to prior and can be a normal finding or due to sequela of chronic inflammation. No evidence of acute inflammation. The appendix is normal. Vascular/Lymphatic: No significant vascular findings are present. No enlarged abdominal or pelvic lymph nodes. Reproductive: Uterus and bilateral adnexa are unremarkable. Other: No free intraperitoneal fluid or air. Musculoskeletal: No acute or significant osseous findings. IMPRESSION: No acute abnormality in the abdomen or pelvis. Marked hepatic steatosis. Mild bilateral hydroureteronephrosis likely due to markedly distended bladder. No obstructing nephrolithiasis. Electronically Signed   By: TPlacido SouM.D.   On: 01/03/2022 19:40   DG CHEST PORT 1 VIEW  Result Date: 01/03/2022 CLINICAL DATA:  Generalized body aches, fatigue, abdominal pain, and nausea EXAM: PORTABLE CHEST 1 VIEW COMPARISON:  Chest radiograph dated 12/24/2019 FINDINGS: Low lung volumes. No focal consolidations. No pleural effusion or pneumothorax. The heart size and  mediastinal contours are within normal limits. The visualized skeletal structures are unremarkable. IMPRESSION: No active disease. Electronically Signed   By: LDarrin NipperM.D.   On: 01/03/2022 16:34    EKG: Independently reviewed.   Assessment/Plan   Acute Anion GAP Metabolic Acidosis related to both DKA as well as Alcohol induced acidosis -ph 7.12 , lactic acid 5.4 , fs 89, etoh 99 -admit to progressive care -continue with bicarb drip  -continue with endo tool per protocol  -monitor bmp q4 hours  -repeat vbg q 6 hours    Sepsis  (Tachycardia, leukocytosis, lactic acidosis, tachypnea)  -unclear source  -possible urine  -ct scan noted Mild bilateral hydroureteronephrosis likely due to markedly distended bladder.  -bladder scan / insert foley  -start empiric abx  -f/u blood/ urine cultures     -  trend lactate, check crp, procalcitonin  AKI due to dehydration  - continue with ivfs  -avoid nephrotoxic medications   Hyperkalemia -repeat labs noted K improving    Mild bilateral hydroureteronephrosis li -foley placed  -will need to call urology in am re further evaluation    Hypertension  -hold ace due to mild aki   Alcohol abuse  -place on ciwa with standing ativan with taper  -mvi, thiamine,folate  -refer to sw for assistance with etoh abstinence       DVT prophylaxis: heparin  Code Status: full Family Communication: none at bedside Disposition Plan: patient  expected to be admitted greater than 2 midnights  Consults called: n/a Admission status: progressive care    Clance Boll MD [facility]   If 7PM-7AM, please contact night-coverage [email or web address] Password [facility]  01/03/2022, 11:28 PM

## 2022-01-03 NOTE — H&P (Incomplete)
History and Physical    Yolanda Flores PJK:[id number] DOB: 09/26/1985 DOA: 01/03/2022  PCP: Ladell Pier, MD  Patient coming from:  home I have personally briefly reviewed patient's old medical records in [place]  Chief Complaint: generalized body aches ,fatigue , abdominal pain n/v  HPI: Yolanda Flores is a 36 y.o. female with medical history significant of  T2DM, alcohol abuse, and hypertension , who presents to ED with complaint of generalized body aches, abdominal pain , n/v/ as well as fatigue recent interim history of admission 12/04/21--12/07/21 with diagnosis of etoh induced acute pancreatitis as well as DKA.  ED Course:  Afeb, bp 119/68, hr 127, rr 22 sat 985  Fs 89 YKD:XIPJA tachycardia  Respiratory panel neg CXR: NAD Wbc 16, hgb 12.7, plt 365, left shift  Etoh99 Vbg: 7.12/bicarb 27 N a 139, K :5, glu 102, cr 1.1, ast 215, alt 79  AG31 Lipase22 CT abd : MPRESSION: No acute abnormality in the abdomen or pelvis.   Marked hepatic steatosis.   Mild bilateral hydroureteronephrosis likely due to markedly distended bladder. No obstructing nephrolithiasis. Dialudid  '1mg'$   TX , 1L LR Review of Systems: As per HPI otherwise 10 point review of systems negative.   Past Medical History:  Diagnosis Date  . AKI (acute kidney injury) ([place])   . Hypertension   . Pancreatitis     Past Surgical History:  Procedure Laterality Date  . FOOT SURGERY       reports that she quit smoking about 3 years ago. Her smoking use included cigarettes. She smoked an average of 4 packs per day. She has never used smokeless tobacco. She reports that she does not currently use alcohol. She reports that she does not use drugs.  Allergies  Allergen Reactions  . Ibuprofen Nausea And Vomiting    Family History  Problem Relation Age of Onset  . Hypertension Mother   . Diabetes Mother   . Hypertension Father   . Hypertension Brother    *** Prior to Admission medications    Medication Sig Start Date End Date Taking? Authorizing Provider  aspirin-acetaminophen-caffeine (EXCEDRIN MIGRAINE) [phone] MG tablet Take 1-2 tablets by mouth every 6 (six) hours as needed for headache.   Yes [provider]  insulin glargine (LANTUS SOLOSTAR) 100 UNIT/ML Solostar Pen Inject 10 Units into the skin at bedtime. 05/13/21  Yes Ladell Pier, MD  lisinopril (ZESTRIL) 5 MG tablet Take 1 tablet (5 mg total) by mouth daily. 12/07/21  Yes Eulogio Bear U, DO  Magnesium 100 MG CAPS Take 1 capsule by mouth daily.   Yes [provider]  metFORMIN (GLUCOPHAGE) 1000 MG tablet Take 1 tablet (1,000 mg total) by mouth 2 (two) times daily with a meal. 12/10/21  Yes Vann, Jessica U, DO  Multiple Vitamin (MULTIVITAMIN WITH MINERALS) TABS tablet Take 1 tablet by mouth daily. 01/18/19  Yes Hosie Poisson, MD  simethicone (MYLICON) 80 MG chewable tablet Chew 80 mg by mouth every 6 (six) hours as needed for flatulence.   Yes [provider]  HYDROcodone-acetaminophen (NORCO/VICODIN) 5-325 MG tablet Take 1-2 tablets by mouth every 4 (four) hours as needed for moderate pain. Patient not taking: Reported on 01/03/2022 12/07/21   Geradine Girt, DO  pantoprazole (PROTONIX) 40 MG tablet Take 1 tablet (40 mg total) by mouth daily. Patient not taking: Reported on 01/03/2022 12/07/21   Geradine Girt, DO  thiamine (VITAMIN B-1) 100 MG tablet Take 1 tablet (100 mg total)  by mouth daily. Patient not taking: Reported on 01/03/2022 12/07/21   Geradine Girt, DO    Physical Exam: Vitals:   01/03/22 1830 01/03/22 1900 01/03/22 1959 01/03/22 2247  BP: 121/65 131/82  (!) 129/91  Pulse: (!) 134 (!) 139  (!) 134  Resp: (!) 30 (!) 22  (!) 26  Temp:   98.7 F (37.1 C)   TempSrc:   Oral   SpO2: 93% 94%  96%  Weight:      Height:        Constitutional: NAD, calm, comfortable Vitals:   01/03/22 1830 01/03/22 1900 01/03/22 1959 01/03/22 2247  BP: 121/65 131/82  (!) 129/91   Pulse: (!) 134 (!) 139  (!) 134  Resp: (!) 30 (!) 22  (!) 26  Temp:   98.7 F (37.1 C)   TempSrc:   Oral   SpO2: 93% 94%  96%  Weight:      Height:       Eyes: PERRL, lids and conjunctivae normal ENMT: Mucous membranes are moist. Posterior pharynx clear of any exudate or lesions.Normal dentition.  Neck: normal, supple, no masses, no thyromegaly Respiratory: clear to auscultation bilaterally, no wheezing, no crackles. Normal respiratory effort. No accessory muscle use.  Cardiovascular: Regular rate and rhythm, no murmurs / rubs / gallops. No extremity edema. 2+ pedal pulses. No carotid bruits.  Abdomen: no tenderness, no masses palpated. No hepatosplenomegaly. Bowel sounds positive.  Musculoskeletal: no clubbing / cyanosis. No joint deformity upper and lower extremities. Good ROM, no contractures. Normal muscle tone.  Skin: no rashes, lesions, ulcers. No induration Neurologic: CN 2-12 grossly intact. Sensation intact, DTR normal. Strength 5/5 in all 4.  Psychiatric: Normal judgment and insight. Alert and oriented x 3. Normal mood.    Labs on Admission: I have personally reviewed following labs and imaging studies  CBC: Recent Labs  Lab 01/03/22 1700  WBC 16.0*  NEUTROABS 14.2*  HGB 12.7  HCT 39.3  MCV 95.6  PLT 381   Basic Metabolic Panel: Recent Labs  Lab 01/03/22 1700 01/03/22 1952  NA 139 135  K 5.0 5.9*  CL 100 100  CO2 8* 8*  GLUCOSE 102* 100*  BUN 15 15  CREATININE 1.10* 1.20*  CALCIUM 8.2* 8.1*   GFR: Estimated Creatinine Clearance: 76.2 mL/min (A) (by C-G formula based on SCr of 1.2 mg/dL (H)). Liver Function Tests: Recent Labs  Lab 01/03/22 1700  AST 215*  ALT 79*  ALKPHOS 93  BILITOT 1.0  PROT 8.7*  ALBUMIN 4.2   Recent Labs  Lab 01/03/22 1700  LIPASE 22   No results for input(s): "AMMONIA" in the last 168 hours. Coagulation Profile: No results for input(s): "INR", "PROTIME" in the last 168 hours. Cardiac Enzymes: No results for  input(s): "CKTOTAL", "CKMB", "CKMBINDEX", "TROPONINI" in the last 168 hours. BNP (last 3 results) No results for input(s): "PROBNP" in the last 8760 hours. HbA1C: No results for input(s): "HGBA1C" in the last 72 hours. CBG: Recent Labs  Lab 01/03/22 1447 01/03/22 1935 01/03/22 2233  GLUCAP 89 94 230*   Lipid Profile: No results for input(s): "CHOL", "HDL", "LDLCALC", "TRIG", "CHOLHDL", "LDLDIRECT" in the last 72 hours. Thyroid Function Tests: No results for input(s): "TSH", "T4TOTAL", "FREET4", "T3FREE", "THYROIDAB" in the last 72 hours. Anemia Panel: No results for input(s): "VITAMINB12", "FOLATE", "FERRITIN", "TIBC", "IRON", "RETICCTPCT" in the last 72 hours. Urine analysis:    Component Value Date/Time   COLORURINE STRAW (A) 01/03/2022 [place] 01/03/2022 1952  LABSPEC 1.015 01/03/2022 1952   PHURINE 5.0 01/03/2022 1952   GLUCOSEU NEGATIVE 01/03/2022 1952   HGBUR LARGE (A) 01/03/2022 [place] NEGATIVE 01/03/2022 1952   KETONESUR 80 (A) 01/03/2022 1952   PROTEINUR 100 (A) 01/03/2022 1952   UROBILINOGEN 2.0 (H) 12/31/2009 1757   NITRITE NEGATIVE 01/03/2022 1952   LEUKOCYTESUR NEGATIVE 01/03/2022 1952    Radiological Exams on Admission: CT ABDOMEN PELVIS W CONTRAST  Result Date: 01/03/2022 CLINICAL DATA:  Acute nonlocalized abdominal pain. Nausea and vomiting EXAM: CT ABDOMEN AND PELVIS WITH CONTRAST TECHNIQUE: Multidetector CT imaging of the abdomen and pelvis was performed using the standard protocol following bolus administration of intravenous contrast. RADIATION DOSE REDUCTION: This exam was performed according to the departmental dose-optimization program which includes automated exposure control, adjustment of the mA and/or kV according to patient size and/or use of iterative reconstruction technique. CONTRAST:  178m OMNIPAQUE IOHEXOL 300 MG/ML  SOLN COMPARISON:  CT 12/04/2021 FINDINGS: Lower chest: No acute abnormality. Hepatobiliary:  Marked hepatic steatosis. Distended gallbladder. No radiopaque stones. No biliary dilation. Pancreas: Marked atrophy.  No evidence of acute pancreatitis. Spleen: Normal in size without focal abnormality. Adrenals/Urinary Tract: Adrenal glands are unremarkable. Mild bilateral hydroureteronephrosis favored due to bladder distention. No obstructing stone. Stomach/Bowel: Stomach is within normal limits. Normal caliber large and small bowel. Diffuse fatty infiltration of the wall of the ascending, transverse, and descending colon is similar to prior and can be a normal finding or due to sequela of chronic inflammation. No evidence of acute inflammation. The appendix is normal. Vascular/Lymphatic: No significant vascular findings are present. No enlarged abdominal or pelvic lymph nodes. Reproductive: Uterus and bilateral adnexa are unremarkable. Other: No free intraperitoneal fluid or air. Musculoskeletal: No acute or significant osseous findings. IMPRESSION: No acute abnormality in the abdomen or pelvis. Marked hepatic steatosis. Mild bilateral hydroureteronephrosis likely due to markedly distended bladder. No obstructing nephrolithiasis. Electronically Signed   By: TPlacido SouM.D.   On: 01/03/2022 19:40   DG CHEST PORT 1 VIEW  Result Date: 01/03/2022 CLINICAL DATA:  Generalized body aches, fatigue, abdominal pain, and nausea EXAM: PORTABLE CHEST 1 VIEW COMPARISON:  Chest radiograph dated 12/24/2019 FINDINGS: Low lung volumes. No focal consolidations. No pleural effusion or pneumothorax. The heart size and mediastinal contours are within normal limits. The visualized skeletal structures are unremarkable. IMPRESSION: No active disease. Electronically Signed   By: LDarrin NipperM.D.   On: 01/03/2022 16:34    EKG: Independently reviewed.   Assessment/Plan   Acute Anion GAP Metabolic Acidosis related to both DKA as well as Alcohol induced acidosis -ph 7.12 , lactic acid 5.4 , fs 89, etoh 99 -admit to  progressive care -continue with bicarb drip  -continue with endo tool per protocol     DKA (diabetic ketoacidosis) ([place] Ketoacidosis has resolved, patient has been transitioned from IV insulin to sq insulin with good toleration.   Essential hypertension -increase ACE   Hypokalemia -replete   Alcohol abuse No clinical signs of withdrawal, continue with as needed oral lorazepam.  Continue oral thiamine and multivitamins She will need outpatient support for alcohol cessation.    Macrocytic anemia Chronic anemia, cell count has been stable.    Diabetes mellitus type 2 in obese ([place] -resume home meds   AKI (acute kidney injury) ([place] -resolved   Class 1 obesity Estimated body mass index is 31.25 kg/m as calculated from the following:   Height as of this encounter: '5\' 7"'$  (1.702 m).   Weight as of  this encounter: 90.5 kg.    DVT prophylaxis: *** (Lovenox/Heparin/SCD's/anticoagulated/None (if comfort care) Code Status: *** (Full/Partial (specify details) Family Communication: *** (Specify name, relationship. Do not write "discussed with patient". Specify tel # if discussed over the phone) Disposition Plan: *** (specify when and where you expect patient to be discharged) Consults called: *** (with names) Admission status: *** (inpatient / obs / tele / medical floor / SDU)   Clance Boll MD [facility] Pager [phone]  If 7PM-7AM, please contact night-coverage [email or web address] Password [facility]  01/03/2022, 11:28 PM

## 2022-01-03 NOTE — ED Provider Notes (Signed)
[facility] DEPT Provider Note   CSN: [id number] Arrival date & time: 01/03/22  1414     History  Chief Complaint  Patient presents with   Abdominal Pain   Generalized Body Aches   Fatigue    Yolanda Flores is a 36 y.o. female.   Abdominal Pain Associated symptoms: cough, fatigue and shortness of breath   Associated symptoms: no chest pain   Patient with a history of diabetes type 2, alcohol abuse, hypertension with a recent admission for acute alcohol related pancreatitis and euglycemic DKA presenting with 3 days of abdominal pain, nausea/vomiting and full body aches.  She does endorse shortness of breath without chest pain.  No documented fevers.  She notes that she was exposed to [place] about a week ago. Patient was brought in by EMS, found to be tachycardic to 140, received 1 L of normal saline and 4 mg of Zofran. She denies possibility of pregnancy, notes that her menstrual cycle just started yesterday and seemed to worsen her overall symptom profile. She does endorse recent alcohol use, reports it has been 2 days since her most recent use.    Home Medications Prior to Admission medications   Medication Sig Start Date End Date Taking? Authorizing Provider  HYDROcodone-acetaminophen (NORCO/VICODIN) 5-325 MG tablet Take 1-2 tablets by mouth every 4 (four) hours as needed for moderate pain. 12/07/21   Geradine Girt, DO  insulin glargine (LANTUS SOLOSTAR) 100 UNIT/ML Solostar Pen Inject 10 Units into the skin at bedtime. 05/13/21   Ladell Pier, MD  lisinopril (ZESTRIL) 5 MG tablet Take 1 tablet (5 mg total) by mouth daily. 12/07/21   Geradine Girt, DO  metFORMIN (GLUCOPHAGE) 1000 MG tablet Take 1 tablet (1,000 mg total) by mouth 2 (two) times daily with a meal. 12/10/21   Geradine Girt, DO  Multiple Vitamin (MULTIVITAMIN WITH MINERALS) TABS tablet Take 1 tablet by mouth daily. 01/18/19   Hosie Poisson, MD  pantoprazole (PROTONIX) 40 MG  tablet Take 1 tablet (40 mg total) by mouth daily. 12/07/21   Geradine Girt, DO  thiamine (VITAMIN B-1) 100 MG tablet Take 1 tablet (100 mg total) by mouth daily. 12/07/21   Geradine Girt, DO      Allergies    Ibuprofen    Review of Systems   Review of Systems  Constitutional:  Positive for fatigue.  Respiratory:  Positive for cough and shortness of breath. Negative for chest tightness.   Cardiovascular:  Negative for chest pain and palpitations.  Gastrointestinal:  Positive for abdominal pain.    Physical Exam Updated Vital Signs BP 131/82 (BP Location: Left Arm)   Pulse (!) 139   Temp 98.7 F (37.1 C) (Oral)   Resp (!) 22   Ht '5\' 7"'$  (1.702 m)   Wt 90.3 kg   LMP 01/02/2022 (Exact Date)   SpO2 94%   BMI 31.17 kg/m  Physical Exam Constitutional:      Comments: Ill-appearing, actively vomiting during my assessment  HENT:     Mouth/Throat:     Comments: Mucous membranes are tacky Cardiovascular:     Rate and Rhythm: Regular rhythm. Tachycardia present.     Heart sounds: No murmur heard. Pulmonary:     Comments: Speaking in 2-3 word phrases, lungs coarse throughout. Abdominal:     Comments: Nondistended, diffusely tender without rebound or guarding  Lymphadenopathy:     Cervical: No cervical adenopathy.  Skin:    Capillary Refill: Capillary refill  takes 2 to 3 seconds.     Comments: Poor turgor  Neurological:     Comments: Answers questions appropriately, follows commands  Psychiatric:        Mood and Affect: Mood normal.     ED Results / Procedures / Treatments   Labs (all labs ordered are listed, but only abnormal results are displayed) Labs Reviewed  CBC WITH DIFFERENTIAL/PLATELET - Abnormal; Notable for the following components:      Result Value   WBC 16.0 (*)    Neutro Abs 14.2 (*)    Abs Immature Granulocytes 0.09 (*)    All other components within normal limits  COMPREHENSIVE METABOLIC PANEL - Abnormal; Notable for the following components:    CO2 8 (*)    Glucose, Bld 102 (*)    Creatinine, Ser 1.10 (*)    Calcium 8.2 (*)    Total Protein 8.7 (*)    AST 215 (*)    ALT 79 (*)    Anion gap 31 (*)    All other components within normal limits  ETHANOL - Abnormal; Notable for the following components:   Alcohol, Ethyl (B) 99 (*)    All other components within normal limits  BLOOD GAS, VENOUS - Abnormal; Notable for the following components:   pH, Ven 7.12 (*)    pCO2, Ven 27 (*)    pO2, Ven 67 (*)    Bicarbonate 8.8 (*)    Acid-base deficit 19.1 (*)    All other components within normal limits  RESP PANEL BY RT-PCR (RSV, FLU A&B, COVID)  RVPGX2  LIPASE, BLOOD  URINALYSIS, ROUTINE W REFLEX MICROSCOPIC  RAPID URINE DRUG SCREEN, HOSP PERFORMED  BETA-HYDROXYBUTYRIC ACID  BASIC METABOLIC PANEL  BASIC METABOLIC PANEL  BASIC METABOLIC PANEL  BASIC METABOLIC PANEL  BETA-HYDROXYBUTYRIC ACID  LACTIC ACID, PLASMA  LACTIC ACID, PLASMA  VOLATILES,BLD-ACETONE,ETHANOL,ISOPROP,METHANOL  CBG MONITORING, ED  I-STAT BETA HCG BLOOD, ED ([facility], [facility], [facility] ONLY)  CBG MONITORING, ED    EKG EKG Interpretation  Date/Time:  Friday January 03 2022 14:52:33 EST Ventricular Rate:  124 PR Interval:  143 QRS Duration: 88 QT Interval:  334 QTC Calculation: 480 R Axis:   -34 Text Interpretation: Sinus tachycardia Consider right atrial enlargement Left axis deviation Borderline prolonged QT interval No significant change since last tracing Confirmed by Wandra Arthurs [phone]) on 01/03/2022 4:18:51 PM  Radiology CT ABDOMEN PELVIS W CONTRAST  Result Date: 01/03/2022 CLINICAL DATA:  Acute nonlocalized abdominal pain. Nausea and vomiting EXAM: CT ABDOMEN AND PELVIS WITH CONTRAST TECHNIQUE: Multidetector CT imaging of the abdomen and pelvis was performed using the standard protocol following bolus administration of intravenous contrast. RADIATION DOSE REDUCTION: This exam was performed according to the departmental dose-optimization program which  includes automated exposure control, adjustment of the mA and/or kV according to patient size and/or use of iterative reconstruction technique. CONTRAST:  164m OMNIPAQUE IOHEXOL 300 MG/ML  SOLN COMPARISON:  CT 12/04/2021 FINDINGS: Lower chest: No acute abnormality. Hepatobiliary: Marked hepatic steatosis. Distended gallbladder. No radiopaque stones. No biliary dilation. Pancreas: Marked atrophy.  No evidence of acute pancreatitis. Spleen: Normal in size without focal abnormality. Adrenals/Urinary Tract: Adrenal glands are unremarkable. Mild bilateral hydroureteronephrosis favored due to bladder distention. No obstructing stone. Stomach/Bowel: Stomach is within normal limits. Normal caliber large and small bowel. Diffuse fatty infiltration of the wall of the ascending, transverse, and descending colon is similar to prior and can be a normal finding or due to sequela of chronic inflammation. No evidence of  acute inflammation. The appendix is normal. Vascular/Lymphatic: No significant vascular findings are present. No enlarged abdominal or pelvic lymph nodes. Reproductive: Uterus and bilateral adnexa are unremarkable. Other: No free intraperitoneal fluid or air. Musculoskeletal: No acute or significant osseous findings. IMPRESSION: No acute abnormality in the abdomen or pelvis. Marked hepatic steatosis. Mild bilateral hydroureteronephrosis likely due to markedly distended bladder. No obstructing nephrolithiasis. Electronically Signed   By: Placido Sou M.D.   On: 01/03/2022 19:40   DG CHEST PORT 1 VIEW  Result Date: 01/03/2022 CLINICAL DATA:  Generalized body aches, fatigue, abdominal pain, and nausea EXAM: PORTABLE CHEST 1 VIEW COMPARISON:  Chest radiograph dated 12/24/2019 FINDINGS: Low lung volumes. No focal consolidations. No pleural effusion or pneumothorax. The heart size and mediastinal contours are within normal limits. The visualized skeletal structures are unremarkable. IMPRESSION: No active disease.  Electronically Signed   By: Darrin Nipper M.D.   On: 01/03/2022 16:34    Procedures Procedures   Medications Ordered in ED Medications  insulin regular, human (MYXREDLIN) 100 units/ 100 mL infusion (has no administration in time range)  lactated ringers infusion (has no administration in time range)  dextrose 5 % in lactated ringers infusion (has no administration in time range)  dextrose 50 % solution 0-50 mL (has no administration in time range)  potassium chloride 10 mEq in 100 mL IVPB (10 mEq Intravenous New Bag/Given 01/03/22 2013)  prochlorperazine (COMPAZINE) injection 10 mg (10 mg Intravenous Given 01/03/22 1658)  lactated ringers bolus 1,000 mL (0 mLs Intravenous Stopped 01/03/22 1808)  HYDROmorphone (DILAUDID) injection 1 mg (1 mg Intravenous Given 01/03/22 1808)  dextrose 50 % solution 50 mL (50 mLs Intravenous Given 01/03/22 2003)  HYDROmorphone (DILAUDID) injection 1 mg (1 mg Intravenous Given 01/03/22 1954)  iohexol (OMNIPAQUE) 300 MG/ML solution 100 mL (100 mLs Intravenous Contrast Given 01/03/22 1930)    ED Course/ Medical Decision Making/ A&P                           Medical Decision Making Amount and/or Complexity of Data Reviewed Labs: ordered. Radiology: ordered.  Risk Prescription drug management. Decision regarding hospitalization.   Given her history of pancreatitis, EtOH use, DKA, the differential for her presentation is broad and includes infectious and noninfectious causes.  Initial glucose in the ED was reassuring at 51, however given that she has had euglycemic DKA in the past, does not entirely rule out DKA as the cause of her abdominal pain and vomiting. CBC remarkable for WBC elevated to 16.0.  pH resulted at 7.12. Anion gap 31.  Ethanol level was also elevated at 99. Cr reassuringly 1.10, though is up from recent baseline of 0.69.   Unclear what may be causing her metabolic acidosis. Lactic acid pending. Also possible this represent euglycemic DKA given  history of the same.  With the elevated white count, I am concerned for an infectious cause, therefore ordered CT of the abdomen and pelvis.  It is also possible that heavy alcohol use was the driver for this presentation but will need to rule out other causes. Per chart review, had an admission for similar presentation 1 mo ago and responded well to Endotool.  Given her history of euglycemia, an amp of D50 was administered prior to initiation of the Endo tool. BHB >8.00.  CT of the abdomen and pelvis resulted without obvious source of infection, though there was a markedly distended bladder with mild bilateral hydroureteronephrosis without any nonobstructing nephrolithiasis.  Also notable for marked hepatic steatosis.  Patient negative for COVID/flu/RSV.  Lactate resulted at 5.4. No clear source at this time. Will start empiric antibiotics with Vanc/Zosyn.   The trigger for her profound acidoses remains unclear at this time. Remains possible that symptoms may be due to [facility] but need to rule out infectious causes vs methanol intoxication or similar. Volatiles panel ordered.   Given profound metabolic acidosis, discussed with Dr. Marval Regal who felt that lab abnormalities could just be reflective of heavy etOH use. Recommended bicarb drip and hospital admission for monitoring. Will admit to hospitalist for ongoing management and work up.   Final Clinical Impression(s) / ED Diagnoses Final diagnoses:  Diabetic ketoacidosis without coma associated with type 2 diabetes mellitus ([place])  Metabolic acidosis    Rx / DC Orders ED Discharge Orders     None      Pearla Dubonnet, MD    Eppie Gibson, MD 01/03/22 2312    Drenda Freeze, MD 01/04/22 1816

## 2022-01-03 NOTE — Progress Notes (Signed)
A consult was received from an ED physician for vancomycin and Zosyn per pharmacy dosing (for an indication other than meningitis). The patient's profile has been reviewed for ht/wt/allergies/indication/available labs. A one time order has been placed for the above antibiotics.  Further antibiotics/pharmacy consults should be ordered by admitting physician if indicated.                       Reuel Boom, PharmD, BCPS [phone] 01/03/2022, 9:00 PM

## 2022-01-04 LAB — BASIC METABOLIC PANEL
Anion gap: 20 — ABNORMAL HIGH (ref 5–15)
Anion gap: 12 (ref 5–15)
Anion gap: 13 (ref 5–15)
BUN: 15 mg/dL (ref 6–20)
BUN: 12 mg/dL (ref 6–20)
BUN: 12 mg/dL (ref 6–20)
CO2: 13 mmol/L — ABNORMAL LOW (ref 22–32)
CO2: 21 mmol/L — ABNORMAL LOW (ref 22–32)
CO2: 22 mmol/L (ref 22–32)
Calcium: 8.2 mg/dL — ABNORMAL LOW (ref 8.9–10.3)
Calcium: 8.6 mg/dL — ABNORMAL LOW (ref 8.9–10.3)
Calcium: 8.7 mg/dL — ABNORMAL LOW (ref 8.9–10.3)
Chloride: 101 mmol/L (ref 98–111)
Chloride: 103 mmol/L (ref 98–111)
Chloride: 100 mmol/L (ref 98–111)
Creatinine, Ser: 1.27 mg/dL — ABNORMAL HIGH (ref 0.44–1.00)
Creatinine, Ser: 0.89 mg/dL (ref 0.44–1.00)
Creatinine, Ser: 0.89 mg/dL (ref 0.44–1.00)
GFR, Estimated: 57 mL/min — ABNORMAL LOW (ref >60)
GFR, Estimated: >60 mL/min (ref >60)
GFR, Estimated: >60 mL/min (ref >60)
Glucose, Bld: 216 mg/dL — ABNORMAL HIGH (ref 70–99)
Glucose, Bld: 97 mg/dL (ref 70–99)
Glucose, Bld: 130 mg/dL — ABNORMAL HIGH (ref 70–99)
Potassium: 6.1 mmol/L — ABNORMAL HIGH (ref 3.5–5.1)
Potassium: 4.3 mmol/L (ref 3.5–5.1)
Potassium: 4.2 mmol/L (ref 3.5–5.1)
Sodium: 134 mmol/L — ABNORMAL LOW (ref 135–145)
Sodium: 136 mmol/L (ref 135–145)
Sodium: 135 mmol/L (ref 135–145)

## 2022-01-04 LAB — BETA-HYDROXYBUTYRIC ACID
Beta-Hydroxybutyric Acid: >8 mmol/L — ABNORMAL HIGH (ref 0.05–0.27)
Beta-Hydroxybutyric Acid: 6.36 mmol/L — ABNORMAL HIGH (ref 0.05–0.27)
Beta-Hydroxybutyric Acid: 2.3 mmol/L — ABNORMAL HIGH (ref 0.05–0.27)

## 2022-01-04 LAB — CBC
HCT: 31.8 % — ABNORMAL LOW (ref 36.0–46.0)
Hemoglobin: 10.6 g/dL — ABNORMAL LOW (ref 12.0–15.0)
MCH: 30.5 pg (ref 26.0–34.0)
MCHC: 33.3 g/dL (ref 30.0–36.0)
MCV: 91.4 fL (ref 80.0–100.0)
Platelets: 218 10*3/uL (ref 150–400)
RBC: 3.48 MIL/uL — ABNORMAL LOW (ref 3.87–5.11)
RDW: 14.2 % (ref 11.5–15.5)
WBC: 7.1 10*3/uL (ref 4.0–10.5)
nRBC: 0 % (ref 0.0–0.2)

## 2022-01-04 LAB — BLOOD GAS, VENOUS
Acid-base deficit: 11.9 mmol/L — ABNORMAL HIGH (ref 0.0–2.0)
Acid-base deficit: 1.9 mmol/L (ref 0.0–2.0)
Bicarbonate: 13.9 mmol/L — ABNORMAL LOW (ref 20.0–28.0)
Bicarbonate: 22.2 mmol/L (ref 20.0–28.0)
O2 Saturation: 94 %
O2 Saturation: 95.7 %
Patient temperature: 37
Patient temperature: 36.1
pCO2, Ven: 31 mmHg — ABNORMAL LOW (ref 44–60)
pCO2, Ven: 34 mmHg — ABNORMAL LOW (ref 44–60)
pH, Ven: 7.26 (ref 7.25–7.43)
pH, Ven: 7.42 (ref 7.25–7.43)
pO2, Ven: 65 mmHg — ABNORMAL HIGH (ref 32–45)
pO2, Ven: 68 mmHg — ABNORMAL HIGH (ref 32–45)

## 2022-01-04 LAB — CBG MONITORING, ED
Glucose-Capillary: 101 mg/dL — ABNORMAL HIGH (ref 70–99)
Glucose-Capillary: 107 mg/dL — ABNORMAL HIGH (ref 70–99)
Glucose-Capillary: 147 mg/dL — ABNORMAL HIGH (ref 70–99)
Glucose-Capillary: 148 mg/dL — ABNORMAL HIGH (ref 70–99)
Glucose-Capillary: 161 mg/dL — ABNORMAL HIGH (ref 70–99)
Glucose-Capillary: 213 mg/dL — ABNORMAL HIGH (ref 70–99)
Glucose-Capillary: 234 mg/dL — ABNORMAL HIGH (ref 70–99)
Glucose-Capillary: 93 mg/dL (ref 70–99)
Glucose-Capillary: 97 mg/dL (ref 70–99)

## 2022-01-04 LAB — GLUCOSE, CAPILLARY
Glucose-Capillary: 117 mg/dL — ABNORMAL HIGH (ref 70–99)
Glucose-Capillary: 93 mg/dL (ref 70–99)

## 2022-01-04 LAB — PHOSPHORUS: Phosphorus: 2.2 mg/dL — ABNORMAL LOW (ref 2.5–4.6)

## 2022-01-04 LAB — MAGNESIUM: Magnesium: 1.4 mg/dL — ABNORMAL LOW (ref 1.7–2.4)

## 2022-01-04 LAB — VOLATILES,BLD-ACETONE,ETHANOL,ISOPROP,METHANOL
Acetone, blood: 0.024 g/dL — ABNORMAL HIGH (ref 0.000–0.010)
Ethanol, blood: <0.01 g/dL (ref 0.000–0.010)
Isopropanol, blood: <0.01 g/dL (ref 0.000–0.010)
Methanol, blood: <0.01 g/dL (ref 0.000–0.010)

## 2022-01-04 LAB — LACTIC ACID, PLASMA: Lactic Acid, Venous: 1.5 mmol/L (ref 0.5–1.9)

## 2022-01-04 MED ORDER — INSULIN GLARGINE-YFGN 100 UNIT/ML ~~LOC~~ SOLN
10.0000 [IU] | Freq: Every day | SUBCUTANEOUS | Status: DC
  Administered 2022-01-04 – 2022-01-05 (×2): 10 [IU] via SUBCUTANEOUS
  Filled 2022-01-04 (×2): qty 0.1

## 2022-01-04 MED ORDER — VANCOMYCIN HCL 750 MG/150ML IV SOLN
750.0000 mg | Freq: Two times a day (BID) | INTRAVENOUS | Status: DC
  Administered 2022-01-04 – 2022-01-05 (×2): 750 mg via INTRAVENOUS
  Filled 2022-01-04 (×4): qty 150

## 2022-01-04 MED ORDER — OXYCODONE-ACETAMINOPHEN 5-325 MG PO TABS
1.0000 | ORAL_TABLET | Freq: Four times a day (QID) | ORAL | Status: DC | PRN
  Administered 2022-01-04 – 2022-01-05 (×2): 1 via ORAL
  Filled 2022-01-04: qty 1

## 2022-01-04 MED ORDER — DEXTROSE 50 % IV SOLN: 0.0000 mL | INTRAVENOUS | Status: DC | PRN

## 2022-01-04 MED ORDER — INSULIN REGULAR(HUMAN) IN NACL 100-0.9 UT/100ML-% IV SOLN: INTRAVENOUS | Status: DC

## 2022-01-04 MED ORDER — VANCOMYCIN HCL 2000 MG/400ML IV SOLN
2000.0000 mg | INTRAVENOUS | Status: AC
  Administered 2022-01-04: 2000 mg via INTRAVENOUS
  Filled 2022-01-04: qty 400

## 2022-01-04 MED ORDER — INSULIN ASPART 100 UNIT/ML IJ SOLN
0.0000 [IU] | Freq: Every day | INTRAMUSCULAR | Status: DC
  Filled 2022-01-04: qty 0.05

## 2022-01-04 MED ORDER — INSULIN ASPART 100 UNIT/ML IJ SOLN
0.0000 [IU] | Freq: Three times a day (TID) | INTRAMUSCULAR | Status: DC
  Administered 2022-01-05: 1 [IU] via SUBCUTANEOUS
  Filled 2022-01-04: qty 0.09

## 2022-01-04 MED ORDER — LORAZEPAM 1 MG PO TABS
1.0000 mg | ORAL_TABLET | ORAL | Status: DC | PRN
  Administered 2022-01-04: 1 mg via ORAL
  Filled 2022-01-04: qty 1

## 2022-01-04 MED ORDER — ALUM & MAG HYDROXIDE-SIMETH 200-200-20 MG/5ML PO SUSP: 30.0000 mL | ORAL | Status: DC | PRN

## 2022-01-04 MED ORDER — OXYCODONE-ACETAMINOPHEN 5-325 MG PO TABS
1.0000 | ORAL_TABLET | Freq: Three times a day (TID) | ORAL | Status: DC | PRN
  Administered 2022-01-04 (×2): 1 via ORAL
  Filled 2022-01-04 (×3): qty 1

## 2022-01-04 MED ORDER — PANTOPRAZOLE SODIUM 40 MG IV SOLR
40.0000 mg | INTRAVENOUS | Status: DC
  Administered 2022-01-04: 40 mg via INTRAVENOUS
  Filled 2022-01-04: qty 10

## 2022-01-04 MED ORDER — LISINOPRIL 5 MG PO TABS
5.0000 mg | ORAL_TABLET | Freq: Every day | ORAL | Status: DC
  Administered 2022-01-04 – 2022-01-05 (×2): 5 mg via ORAL
  Filled 2022-01-04 (×2): qty 1

## 2022-01-04 MED ORDER — VANCOMYCIN HCL 1250 MG/250ML IV SOLN: 1250.0000 mg | INTRAVENOUS | Status: DC

## 2022-01-04 MED ORDER — THIAMINE MONONITRATE 100 MG PO TABS
100.0000 mg | ORAL_TABLET | Freq: Every day | ORAL | Status: DC
  Administered 2022-01-04 – 2022-01-05 (×2): 100 mg via ORAL
  Filled 2022-01-04 (×2): qty 1

## 2022-01-04 MED ORDER — FOLIC ACID 1 MG PO TABS
1.0000 mg | ORAL_TABLET | Freq: Every day | ORAL | Status: DC
  Administered 2022-01-04 – 2022-01-05 (×2): 1 mg via ORAL
  Filled 2022-01-04 (×2): qty 1

## 2022-01-04 MED ORDER — DEXTROSE IN LACTATED RINGERS 5 % IV SOLN: INTRAVENOUS | Status: DC

## 2022-01-04 MED ORDER — SIMETHICONE 80 MG PO CHEW: 80.0000 mg | CHEWABLE_TABLET | Freq: Four times a day (QID) | ORAL | Status: DC | PRN

## 2022-01-04 MED ORDER — LACTATED RINGERS IV SOLN: INTRAVENOUS | Status: DC

## 2022-01-04 MED ORDER — THIAMINE HCL 100 MG/ML IJ SOLN: 100.0000 mg | Freq: Every day | INTRAMUSCULAR | Status: DC

## 2022-01-04 MED ORDER — LORAZEPAM 2 MG/ML IJ SOLN: 1.0000 mg | INTRAMUSCULAR | Status: DC | PRN

## 2022-01-04 MED ORDER — HEPARIN SODIUM (PORCINE) 5000 UNIT/ML IJ SOLN
5000.0000 [IU] | Freq: Three times a day (TID) | INTRAMUSCULAR | Status: DC
  Administered 2022-01-04 – 2022-01-05 (×4): 5000 [IU] via SUBCUTANEOUS
  Filled 2022-01-04 (×4): qty 1

## 2022-01-04 MED ORDER — ADULT MULTIVITAMIN W/MINERALS CH
1.0000 | ORAL_TABLET | Freq: Every day | ORAL | Status: DC
  Administered 2022-01-04 – 2022-01-05 (×2): 1 via ORAL
  Filled 2022-01-04 (×2): qty 1

## 2022-01-04 MED ORDER — CALCIUM GLUCONATE-NACL 1-0.675 GM/50ML-% IV SOLN
1.0000 g | Freq: Once | INTRAVENOUS | Status: AC
  Administered 2022-01-04: 1000 mg via INTRAVENOUS
  Filled 2022-01-04: qty 50

## 2022-01-04 MED ORDER — PANTOPRAZOLE SODIUM 40 MG PO TBEC
40.0000 mg | DELAYED_RELEASE_TABLET | Freq: Two times a day (BID) | ORAL | Status: DC
  Administered 2022-01-04 – 2022-01-05 (×3): 40 mg via ORAL
  Filled 2022-01-04 (×3): qty 1

## 2022-01-04 MED ORDER — PIPERACILLIN-TAZOBACTAM 3.375 G IVPB
3.3750 g | Freq: Three times a day (TID) | INTRAVENOUS | Status: DC
  Administered 2022-01-04 – 2022-01-05 (×4): 3.375 g via INTRAVENOUS
  Filled 2022-01-04 (×4): qty 50

## 2022-01-04 NOTE — ED Notes (Signed)
RN used clean technique to remove 67F foley catheter. 10cc NS removed from balloon, catheter bloody upon removal, patient tolerated well, RN educated patient that she may have some dysuria upon removal of catheter but to inform RN if it worsens, clear understanding voiced by patient.

## 2022-01-04 NOTE — Progress Notes (Signed)
PROGRESS NOTE    Yolanda Flores  ZJQ:[id number] DOB: [birth year] DOA: 01/03/2022 PCP: Ladell Pier, MD     Brief Narrative:  Yolanda Flores is a 36 y.o. female with medical history significant of T2DM, alcohol abuse, and hypertension, pancreatitis who presented to ED with complaint of generalized body aches, abdominal pain, n/v as well as fatigue. She was recently admitted 12/04/21--12/07/21 with diagnosis of alcohol induced acute pancreatitis as well as DKA.  Upon presentation to the emergency department, she was found to have tachycardia, leukocytosis, metabolic acidosis, hyperkalemia, lactic acidosis.  She was empirically started on IV antibiotics, IV insulin and admitted to the hospital.  She also required Foley catheter due to mild bilateral hydroureteronephrosis.  New events last 24 hours / Subjective: Patient seen in the emergency department.  She complains of acid reflux.  Otherwise feeling much better.  No further nausea or vomiting this morning.  No abdominal pain today.  Assessment & Plan:  Principal Problem:   Metabolic acidosis, increased anion gap (IAG) Active Problems:   DKA (diabetic ketoacidosis) ([place])   Essential hypertension   Diabetes mellitus type 2 in obese (HCC)   AKI (acute kidney injury) ([place])   High anion gap metabolic acidosis -Thought to be secondary to lactic acidosis, DKA -Patient was treated with bicarb drip, insulin drip, IV fluid -Resolved  DKA -Now off insulin drip, resume Semglee, sliding scale insulin  SIRS -Without clear source -COVID, influenza, RSV negative -Blood cultures are pending -Due to patient's presentation with lactic acidosis, she was started on empiric vancomycin, Zosyn  AKI -Resolved  Hyperkalemia -Resolved  Mild bilateral hydroureteronephrosis -Remove Foley catheter and do spontaneous voiding trial  Hypertension -Resume lisinopril  Alcohol abuse -CIWA protocol  GERD -PPI  DVT prophylaxis:  heparin  injection 5,000 Units Start: 01/04/22 0600  Code Status: Full code Family Communication: At bedside Disposition Plan:  Status is: Inpatient Remains inpatient appropriate because: IV antibiotics   Antimicrobials:  Anti-infectives (From admission, onward)    Start     Dose/Rate Route Frequency Ordered Stop   01/05/22 0200  vancomycin (VANCOREADY) IVPB 1250 mg/250 mL  Status:  Discontinued        1,250 mg 166.7 mL/hr over 90 Minutes Intravenous Every 24 hours 01/04/22 0102 01/04/22 0951   01/04/22 1400  vancomycin (VANCOREADY) IVPB 750 mg/150 mL        750 mg 150 mL/hr over 60 Minutes Intravenous Every 12 hours 01/04/22 0951     01/04/22 0900  piperacillin-tazobactam (ZOSYN) IVPB 3.375 g        3.375 g 12.5 mL/hr over 240 Minutes Intravenous Every 8 hours 01/04/22 0055     01/04/22 0100  vancomycin (VANCOREADY) IVPB 2000 mg/400 mL        2,000 mg 200 mL/hr over 120 Minutes Intravenous STAT 01/04/22 0054 01/04/22 0514   01/03/22 2100  vancomycin (VANCOCIN) IVPB 1000 mg/200 mL premix  Status:  Discontinued        1,000 mg 200 mL/hr over 60 Minutes Intravenous  Once 01/03/22 2058 01/04/22 0052   01/03/22 2100  piperacillin-tazobactam (ZOSYN) IVPB 3.375 g        3.375 g 100 mL/hr over 30 Minutes Intravenous  Once 01/03/22 2058 01/04/22 0203        Objective: Vitals:   01/04/22 0508 01/04/22 0655 01/04/22 0927 01/04/22 1011  BP: (!) 135/106 (!) 125/98 (!) 147/111 (!) 129/104  Pulse: (!) 120 (!) 107  (!) 109  Resp:  16  18  Temp:  98.3 F (36.8 C)    TempSrc:  Oral    SpO2:  97%  100%  Weight:      Height:        Intake/Output Summary (Last 24 hours) at 01/04/2022 1149 Last data filed at 01/04/2022 1117 Gross per 24 hour  Intake 1044.35 ml  Output --  Net 1044.35 ml   Filed Weights   01/03/22 1444  Weight: 90.3 kg    Examination:  General exam: Appears calm and comfortable  Respiratory system: Clear to auscultation. Respiratory effort normal. No respiratory  distress. No conversational dyspnea.  Cardiovascular system: S1 & S2 heard, tachycardic, regular rhythm. No murmurs. No pedal edema. Gastrointestinal system: Abdomen is nondistended, soft and nontender. Normal bowel sounds heard. Central nervous system: Alert and oriented. No focal neurological deficits. Speech clear.  Extremities: Symmetric in appearance  Skin: No rashes, lesions or ulcers on exposed skin  Psychiatry: Judgement and insight appear normal. Mood & affect appropriate.   Data Reviewed: I have personally reviewed following labs and imaging studies  CBC: Recent Labs  Lab 01/03/22 1700 01/04/22 0817  WBC 16.0* 7.1  NEUTROABS 14.2*  --   HGB 12.7 10.6*  HCT 39.3 31.8*  MCV 95.6 91.4  PLT 365 992   Basic Metabolic Panel: Recent Labs  Lab 01/03/22 1952 01/03/22 2228 01/04/22 0127 01/04/22 0128 01/04/22 0707 01/04/22 0817  NA 135 133*  --  134* 136 135  K 5.9* 6.8*  --  6.1* 4.3 4.2  CL 100 99  --  101 103 100  CO2 8* 12*  --  13* 21* 22  GLUCOSE 100* 248*  --  216* 97 130*  BUN 15 14  --  '15 12 12  '$ CREATININE 1.20* 1.36*  --  1.27* 0.89 0.89  CALCIUM 8.1* 8.1*  --  8.2* 8.6* 8.7*  MG  --   --  1.4*  --   --   --   PHOS  --   --  2.2*  --   --   --    GFR: Estimated Creatinine Clearance: 102.8 mL/min (by C-G formula based on SCr of 0.89 mg/dL). Liver Function Tests: Recent Labs  Lab 01/03/22 1700  AST 215*  ALT 79*  ALKPHOS 93  BILITOT 1.0  PROT 8.7*  ALBUMIN 4.2   Recent Labs  Lab 01/03/22 1700  LIPASE 22   No results for input(s): "AMMONIA" in the last 168 hours. Coagulation Profile: No results for input(s): "INR", "PROTIME" in the last 168 hours. Cardiac Enzymes: No results for input(s): "CKTOTAL", "CKMB", "CKMBINDEX", "TROPONINI" in the last 168 hours. BNP (last 3 results) No results for input(s): "PROBNP" in the last 8760 hours. HbA1C: No results for input(s): "HGBA1C" in the last 72 hours. CBG: Recent Labs  Lab 01/04/22 0503  01/04/22 0654 01/04/22 0832 01/04/22 0933 01/04/22 1116  GLUCAP 147* 101* 148* 161* 97   Lipid Profile: No results for input(s): "CHOL", "HDL", "LDLCALC", "TRIG", "CHOLHDL", "LDLDIRECT" in the last 72 hours. Thyroid Function Tests: No results for input(s): "TSH", "T4TOTAL", "FREET4", "T3FREE", "THYROIDAB" in the last 72 hours. Anemia Panel: No results for input(s): "VITAMINB12", "FOLATE", "FERRITIN", "TIBC", "IRON", "RETICCTPCT" in the last 72 hours. Sepsis Labs: Recent Labs  Lab 01/03/22 1953 01/03/22 2153 01/04/22 0817  LATICACIDVEN 5.4* 3.3* 1.5    Recent Results (from the past 240 hour(s))  Resp panel by RT-PCR (RSV, Flu A&B, Covid) Anterior Nasal Swab     Status: None   Collection Time: 01/03/22  4:07  PM   Specimen: Anterior Nasal Swab  Result Value Ref Range Status   SARS Coronavirus 2 by RT PCR NEGATIVE NEGATIVE Final    Comment: (NOTE) SARS-CoV-2 target nucleic acids are NOT DETECTED.  The SARS-CoV-2 RNA is generally detectable in upper respiratory specimens during the acute phase of infection. The lowest concentration of SARS-CoV-2 viral copies this assay can detect is 138 copies/mL. A negative result does not preclude SARS-Cov-2 infection and should not be used as the sole basis for treatment or other patient management decisions. A negative result may occur with  improper specimen collection/handling, submission of specimen other than nasopharyngeal swab, presence of viral mutation(s) within the areas targeted by this assay, and inadequate number of viral copies(<138 copies/mL). A negative result must be combined with clinical observations, patient history, and epidemiological information. The expected result is Negative.  Fact Sheet for Patients:  EntrepreneurPulse.com.au  Fact Sheet for Healthcare Providers:  IncredibleEmployment.be  This test is no t yet approved or cleared by the [place] FDA and  has been  authorized for detection and/or diagnosis of SARS-CoV-2 by FDA under an Emergency Use Authorization (EUA). This EUA will remain  in effect (meaning this test can be used) for the duration of the COVID-19 declaration under Section 564(b)(1) of the Act, 21 U.S.C.section 360bbb-3(b)(1), unless the authorization is terminated  or revoked sooner.       Influenza A by PCR NEGATIVE NEGATIVE Final   Influenza B by PCR NEGATIVE NEGATIVE Final    Comment: (NOTE) The Xpert Xpress SARS-CoV-2/FLU/RSV plus assay is intended as an aid in the diagnosis of influenza from Nasopharyngeal swab specimens and should not be used as a sole basis for treatment. Nasal washings and aspirates are unacceptable for Xpert Xpress SARS-CoV-2/FLU/RSV testing.  Fact Sheet for Patients: EntrepreneurPulse.com.au  Fact Sheet for Healthcare Providers: IncredibleEmployment.be  This test is not yet approved or cleared by the [place] FDA and has been authorized for detection and/or diagnosis of SARS-CoV-2 by FDA under an Emergency Use Authorization (EUA). This EUA will remain in effect (meaning this test can be used) for the duration of the COVID-19 declaration under Section 564(b)(1) of the Act, 21 U.S.C. section 360bbb-3(b)(1), unless the authorization is terminated or revoked.     Resp Syncytial Virus by PCR NEGATIVE NEGATIVE Final    Comment: (NOTE) Fact Sheet for Patients: EntrepreneurPulse.com.au  Fact Sheet for Healthcare Providers: IncredibleEmployment.be  This test is not yet approved or cleared by the [place] FDA and has been authorized for detection and/or diagnosis of SARS-CoV-2 by FDA under an Emergency Use Authorization (EUA). This EUA will remain in effect (meaning this test can be used) for the duration of the COVID-19 declaration under Section 564(b)(1) of the Act, 21 U.S.C. section 360bbb-3(b)(1), unless the  authorization is terminated or revoked.  Performed at [facility], [place] [address], [place], [place] [address]   Culture, blood (Routine X 2) w Reflex to ID Panel     Status: None (Preliminary result)   Collection Time: 01/04/22  8:17 AM   Specimen: BLOOD RIGHT ARM  Result Value Ref Range Status   Specimen Description   Final    BLOOD RIGHT ARM Performed at [facility], [address] [place], [place] [address]    Special Requests   Final    BOTTLES DRAWN AEROBIC AND ANAEROBIC Blood Culture adequate volume Performed at [place] [address], [place],  [address]    Culture PENDING  Incomplete   Report Status PENDING  Incomplete  Culture, blood (Routine X 2) w Reflex to ID Panel     Status: None (Preliminary result)   Collection Time: 01/04/22  8:20 AM   Specimen: BLOOD LEFT ARM  Result Value Ref Range Status   Specimen Description   Final    BLOOD LEFT ARM Performed at [facility], [address], [place], [place] [address]    Special Requests   Final    BOTTLES DRAWN AEROBIC AND ANAEROBIC Blood Culture adequate volume Performed at [place] [address], [place], [place] [address]    Culture PENDING  Incomplete   Report Status PENDING  Incomplete      Radiology Studies: CT ABDOMEN PELVIS W CONTRAST  Result Date: 01/03/2022 CLINICAL DATA:  Acute nonlocalized abdominal pain. Nausea and vomiting EXAM: CT ABDOMEN AND PELVIS WITH CONTRAST TECHNIQUE: Multidetector CT imaging of the abdomen and pelvis was performed using the standard protocol following bolus administration of intravenous contrast. RADIATION DOSE REDUCTION: This exam was performed according to the departmental dose-optimization program which includes automated exposure control, adjustment of the mA and/or kV according to patient size and/or use of iterative reconstruction technique. CONTRAST:  11m OMNIPAQUE IOHEXOL 300 MG/ML   SOLN COMPARISON:  CT 12/04/2021 FINDINGS: Lower chest: No acute abnormality. Hepatobiliary: Marked hepatic steatosis. Distended gallbladder. No radiopaque stones. No biliary dilation. Pancreas: Marked atrophy.  No evidence of acute pancreatitis. Spleen: Normal in size without focal abnormality. Adrenals/Urinary Tract: Adrenal glands are unremarkable. Mild bilateral hydroureteronephrosis favored due to bladder distention. No obstructing stone. Stomach/Bowel: Stomach is within normal limits. Normal caliber large and small bowel. Diffuse fatty infiltration of the wall of the ascending, transverse, and descending colon is similar to prior and can be a normal finding or due to sequela of chronic inflammation. No evidence of acute inflammation. The appendix is normal. Vascular/Lymphatic: No significant vascular findings are present. No enlarged abdominal or pelvic lymph nodes. Reproductive: Uterus and bilateral adnexa are unremarkable. Other: No free intraperitoneal fluid or air. Musculoskeletal: No acute or significant osseous findings. IMPRESSION: No acute abnormality in the abdomen or pelvis. Marked hepatic steatosis. Mild bilateral hydroureteronephrosis likely due to markedly distended bladder. No obstructing nephrolithiasis. Electronically Signed   By: TPlacido SouM.D.   On: 01/03/2022 19:40   DG CHEST PORT 1 VIEW  Result Date: 01/03/2022 CLINICAL DATA:  Generalized body aches, fatigue, abdominal pain, and nausea EXAM: PORTABLE CHEST 1 VIEW COMPARISON:  Chest radiograph dated 12/24/2019 FINDINGS: Low lung volumes. No focal consolidations. No pleural effusion or pneumothorax. The heart size and mediastinal contours are within normal limits. The visualized skeletal structures are unremarkable. IMPRESSION: No active disease. Electronically Signed   By: LDarrin NipperM.D.   On: 01/03/2022 16:34      Scheduled Meds:  folic acid  1 mg Oral Daily   heparin  5,000 Units Subcutaneous Q8H   insulin aspart  0-5  Units Subcutaneous QHS   insulin aspart  0-9 Units Subcutaneous TID WC   insulin glargine-yfgn  10 Units Subcutaneous Daily   lisinopril  5 mg Oral Daily   multivitamin with minerals  1 tablet Oral Daily   pantoprazole  40 mg Oral BID AC   thiamine  100 mg Oral Daily   Or   thiamine  100 mg Intravenous Daily   Continuous Infusions:  piperacillin-tazobactam (ZOSYN)  IV 3.375 g (01/04/22 0931)   vancomycin       LOS: 1 day  Time spent: 30 minutes   Dessa Phi, DO [facility] 01/04/2022, 11:49 AM   Available via Epic secure chat 7am-7pm After these hours, please refer to coverage provider listed on [email or web address]

## 2022-01-04 NOTE — Progress Notes (Signed)
Pharmacy Antibiotic Note  Yolanda Flores is a 36 y.o. female admitted on 01/03/2022 with sepsis.  Pharmacy has been consulted for Vancomycin and Zosyn dosing.  Plan: Initial Vancomycin 1gm changed to 2gm IV x 1 (as initial 1g dose not yet given) followed by Vancomycin 1250 mg IV Q 24 hrs. Goal AUC 400-550. Expected AUC: 538.2 SCr used: 1.36 Zosyn 3.375gm IV q8h (each dose infused over 4 hours) Daily SCr while on both Vancomycin and Zosyn   Height: '5\' 7"'$  (170.2 cm) Weight: 90.3 kg (199 lb) IBW/kg (Calculated) : 61.6  Temp (24hrs), Avg:98.8 F (37.1 C), Min:98.4 F (36.9 C), Max:99.2 F (37.3 C)  Recent Labs  Lab 01/03/22 1700 01/03/22 1952 01/03/22 1953 01/03/22 2153 01/03/22 2228  WBC 16.0*  --   --   --   --   CREATININE 1.10* 1.20*  --   --  1.36*  LATICACIDVEN  --   --  5.4* 3.3*  --     Estimated Creatinine Clearance: 67.3 mL/min (A) (by C-G formula based on SCr of 1.36 mg/dL (H)).    Allergies  Allergen Reactions   Ibuprofen Nausea And Vomiting    Antimicrobials this admission: 12/23 Zosyn >>   12/23 Vancomycin >>    Dose adjustments this admission:    Microbiology results:    Thank you for allowing pharmacy to be a part of this patient's care.  Everette Rank, PharmD 01/04/2022 12:55 AM

## 2022-01-04 NOTE — Progress Notes (Signed)
Inpatient Diabetes Program Recommendations  AACE/ADA: New Consensus Statement on Inpatient Glycemic Control (2015)  Target Ranges:  Prepandial:   less than 140 mg/dL      Peak postprandial:   less than 180 mg/dL (1-2 hours)      Critically ill patients:  140 - 180 mg/dL   Lab Results  Component Value Date   GLUCAP 161 (H) 01/04/2022   HGBA1C 5.8 (H) 12/04/2021    Review of Glycemic Control  Latest Reference Range & Units 01/04/22 01:35 01/04/22 03:47 01/04/22 05:03 01/04/22 06:54 01/04/22 08:32 01/04/22 09:33  Glucose-Capillary 70 - 99 mg/dL 213 (H) 93 147 (H) 101 (H) 148 (H) 161 (H)   Diabetes history: DM 2 Outpatient Diabetes medications:  Lantus 10 units daily, Metformin 1000 mg bid Current orders for Inpatient glycemic control:  IV insulin-DKA orders  Inpatient Diabetes Program Recommendations:    Note blood sugar initially normal however patient with metabolic acidosis (likely not related to blood glucose).    Thanks,  Adah Perl, RN, BC-ADM Inpatient Diabetes Coordinator Pager [phone]  (8a-5p)

## 2022-01-04 NOTE — Progress Notes (Signed)
Pharmacy Antibiotic Note  Yolanda Flores is a 36 y.o. female with hx pancreatitis  and DKA who presented to the ED on 01/03/2022 with c/o abdominal pain, n/v and generalized weakness. Vancomycin and zosyn started on adm for suspected sepsis.   - 12/22 CXR: no active dz - 12/22 abd CT: Mild bilateral hydroureteronephrosis likely due to markedly distended bladder.  Today, 01/04/2022: - Tmax 99.2 - wbc wnl - scr down 0.89 (crcl~100) with hydration  Plan: - adjust vancomycin dose to 750 mg OV q12h for est AUC 436 - continue zosyn 3.375 gm IV q8h (infuse over 4 hrs) - daily scr   __________________________________  Height: '5\' 7"'$  (170.2 cm) Weight: 90.3 kg (199 lb) IBW/kg (Calculated) : 61.6  Temp (24hrs), Avg:98.7 F (37.1 C), Min:98.3 F (36.8 C), Max:99.2 F (37.3 C)  Recent Labs  Lab 01/03/22 1700 01/03/22 1952 01/03/22 1953 01/03/22 2153 01/03/22 2228 01/04/22 0128 01/04/22 0707 01/04/22 0817  WBC 16.0*  --   --   --   --   --   --  7.1  CREATININE 1.10* 1.20*  --   --  1.36* 1.27* 0.89 0.89  LATICACIDVEN  --   --  5.4* 3.3*  --   --   --  1.5    Estimated Creatinine Clearance: 102.8 mL/min (by C-G formula based on SCr of 0.89 mg/dL).    Allergies  Allergen Reactions   Ibuprofen Nausea And Vomiting     Thank you for allowing pharmacy to be a part of this patient's care.  Lynelle Doctor 01/04/2022 9:46 AM

## 2022-01-05 LAB — BASIC METABOLIC PANEL
Anion gap: 12 (ref 5–15)
BUN: 7 mg/dL (ref 6–20)
CO2: 26 mmol/L (ref 22–32)
Calcium: 8.8 mg/dL — ABNORMAL LOW (ref 8.9–10.3)
Chloride: 97 mmol/L — ABNORMAL LOW (ref 98–111)
Creatinine, Ser: 0.8 mg/dL (ref 0.44–1.00)
GFR, Estimated: >60 mL/min (ref >60)
Glucose, Bld: 94 mg/dL (ref 70–99)
Potassium: 3.4 mmol/L — ABNORMAL LOW (ref 3.5–5.1)
Sodium: 135 mmol/L (ref 135–145)

## 2022-01-05 LAB — CBC
HCT: 35.1 % — ABNORMAL LOW (ref 36.0–46.0)
Hemoglobin: 11.5 g/dL — ABNORMAL LOW (ref 12.0–15.0)
MCH: 31 pg (ref 26.0–34.0)
MCHC: 32.8 g/dL (ref 30.0–36.0)
MCV: 94.6 fL (ref 80.0–100.0)
Platelets: 178 10*3/uL (ref 150–400)
RBC: 3.71 MIL/uL — ABNORMAL LOW (ref 3.87–5.11)
RDW: 13.7 % (ref 11.5–15.5)
WBC: 4.3 10*3/uL (ref 4.0–10.5)
nRBC: 0 % (ref 0.0–0.2)

## 2022-01-05 LAB — GLUCOSE, CAPILLARY
Glucose-Capillary: 113 mg/dL — ABNORMAL HIGH (ref 70–99)
Glucose-Capillary: 132 mg/dL — ABNORMAL HIGH (ref 70–99)

## 2022-01-05 MED ORDER — OMEPRAZOLE MAGNESIUM 20 MG PO TBEC: 20.0000 mg | DELAYED_RELEASE_TABLET | Freq: Two times a day (BID) | ORAL | 1 refills | Status: DC

## 2022-01-05 MED ORDER — POTASSIUM CHLORIDE CRYS ER 20 MEQ PO TBCR
40.0000 meq | EXTENDED_RELEASE_TABLET | Freq: Once | ORAL | Status: AC
  Administered 2022-01-05: 40 meq via ORAL
  Filled 2022-01-05: qty 2

## 2022-01-05 NOTE — TOC Initial Note (Addendum)
Transition of Care [facility]) - Initial/Assessment Note    Patient Details  Name: Yolanda Flores MRN: [id number] Date of Birth: 1985/05/15  Transition of Care [facility] CM/SW Contact:    Henrietta Dine, RN Phone Number: 01/05/2022, 10:42 AM  Clinical Narrative:                 [facility] consult for SA counseling/education; also pt has no insurance listed; spoke w/ pt in room; the pt agrees to receiving resources for SA counseling/education; she also agrees to receiving resource for community care clinics and social services; the pt says she starts a new job and her new insurance Nurse, 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        Patient Goals and CMS Choice            Expected Discharge Plan and Services         Expected Discharge Date: 01/05/22                                    Prior Living Arrangements/Services                       Activities of Daily Living Home Assistive Devices/Equipment: None ADL Screening (condition at time of admission) Patient's cognitive ability adequate to safely complete daily activities?: Yes Is the patient deaf or have difficulty hearing?: No Does the patient have difficulty seeing, even when wearing glasses/contacts?: No Does the patient have difficulty concentrating, remembering, or making decisions?: No Patient able to express need for assistance with ADLs?: Yes Does the patient have difficulty dressing or bathing?: No Independently performs ADLs?: Yes (appropriate for developmental age) Does the patient  have difficulty walking or climbing stairs?: No Weakness of Legs: None Weakness of Arms/Hands: None  Permission Sought/Granted                  Emotional Assessment              Admission diagnosis:  Metabolic acidosis, increased anion gap (IAG) [F81.01] Metabolic acidosis [B51.02] DKA (diabetic ketoacidosis) ([place]) [E11.10] Diabetic ketoacidosis without coma associated with type 2 diabetes mellitus ([place]) [E11.10] Patient Active Problem List   Diagnosis Date Noted   Metabolic acidosis, increased anion gap (IAG) 01/03/2022   DKA (diabetic ketoacidosis) ([place]) 12/05/2021   Class 1 obesity 12/05/2021   AKI (acute kidney injury) ([place]) 12/05/2021   Former smoker 02/26/2021   Diabetes mellitus type 2 in obese ([place]) 02/26/2021   Macrocytic anemia 12/27/2019   Normocytic anemia 01/17/2019   Thrombocytosis 01/17/2019   Heartburn    Alcohol induced acute pancreatitis 01/04/2019   Essential hypertension 58/52/7782   Metabolic acidosis 42/35/3614   Alcohol abuse 05/24/2018   Abnormal LFTs 05/24/2018   Prolonged QT interval    PCP:  Ladell Pier, MD Pharmacy:   [facility], [place] [address]  Phone: [phone] Fax: [place], [place] [address] Phone: [phone] Fax: [phone]     Social Determinants of Health (SDOH) Social History: [place]: Unknown (01/04/2022)  Housing: Low Risk  (01/04/2022)  Transportation Needs: No Transportation Needs (01/04/2022)  Utilities: Not At Risk (01/04/2022)  Depression (PHQ2-9): Low Risk  (05/13/2021)  Tobacco Use: Medium Risk (01/03/2022)   SDOH Interventions:     Readmission Risk Interventions     No data to display

## 2022-01-05 NOTE — Discharge Summary (Signed)
Physician Discharge Summary  MIRAH NEVINS FYB:[id number] DOB: [birth year] DOA: 01/03/2022  PCP: Ladell Pier, MD  Admit date: 01/03/2022 Discharge date: 01/05/2022  Admitted From: Home Disposition: Home  Recommendations for Outpatient Follow-up:  Follow up with PCP in 1 week  Discharge Condition: Stable CODE STATUS: Full code Diet recommendation: Carb modified diet  Brief/Interim Summary: Yolanda Flores is a 36 y.o. female with medical history significant of T2DM, alcohol abuse, and hypertension, pancreatitis who presented to ED with complaint of generalized body aches, abdominal pain, n/v as well as fatigue. She was recently admitted 12/04/21--12/07/21 with diagnosis of alcohol induced acute pancreatitis as well as DKA.  Upon presentation to the emergency department, she was found to have tachycardia, leukocytosis, metabolic acidosis, hyperkalemia, lactic acidosis.  She was empirically started on IV antibiotics, IV insulin and admitted to the hospital.  She also required Foley catheter due to mild bilateral hydroureteronephrosis.  Foley catheter was removed and patient was able to void spontaneously.  She was transition from IV insulin to subcutaneous insulin regimen.  Sepsis was ruled out, no source of infection identified.  Labs trended back toward normal and on day of discharge, patient was feeling well and tolerating diet.  Discharge Diagnoses:   Principal Problem:   Metabolic acidosis, increased anion gap (IAG) Active Problems:   DKA (diabetic ketoacidosis) ([facility])   Essential hypertension   Diabetes mellitus type 2 in obese (HCC)   AKI (acute kidney injury) ([facility])  High anion gap metabolic acidosis -Thought to be secondary to lactic acidosis, DKA -Patient was treated with bicarb drip, insulin drip, IV fluid -Resolved   DKA -Now off insulin drip, resume Semglee, sliding scale insulin   SIRS -Without clear source -COVID, influenza, RSV negative -Blood cultures are  pending -Due to patient's presentation with lactic acidosis, she was started on empiric vancomycin, Zosyn.  Discontinued.  No source of infection identified.  Afebrile.  Sepsis ruled out   AKI -Resolved   Hyperkalemia -Resolved   Mild bilateral hydroureteronephrosis -Foley catheter removed   Hypertension -Resume lisinopril   Alcohol abuse -CIWA protocol.  No sign of withdrawal at this time.   GERD -PPI  Discharge Instructions  Discharge Instructions     Call MD for:  difficulty breathing, headache or visual disturbances   Complete by: As directed    Call MD for:  extreme fatigue   Complete by: As directed    Call MD for:  persistant dizziness or light-headedness   Complete by: As directed    Call MD for:  persistant nausea and vomiting   Complete by: As directed    Call MD for:  severe uncontrolled pain   Complete by: As directed    Call MD for:  temperature >100.4   Complete by: As directed    Diet Carb Modified   Complete by: As directed    Discharge instructions   Complete by: As directed    You were cared for by a hospitalist during your hospital stay. If you have any questions about your discharge medications or the care you received while you were in the hospital after you are discharged, you can call the unit and ask to speak with the hospitalist on call if the hospitalist that took care of you is not available. Once you are discharged, your primary care physician will handle any further medical issues. Please note that NO REFILLS for any discharge medications will be authorized once you are discharged, as it is imperative that you return to  your primary care physician (or establish a relationship with a primary care physician if you do not have one) for your aftercare needs so that they can reassess your need for medications and monitor your lab values.   Increase activity slowly   Complete by: As directed       Allergies as of 01/05/2022       Reactions    Ibuprofen Nausea And Vomiting        Medication List     STOP taking these medications    HYDROcodone-acetaminophen 5-325 MG tablet Commonly known as: NORCO/VICODIN   metFORMIN 1000 MG tablet Commonly known as: GLUCOPHAGE   pantoprazole 40 MG tablet Commonly known as: PROTONIX       TAKE these medications    aspirin-acetaminophen-caffeine 250-250-65 MG tablet Commonly known as: EXCEDRIN MIGRAINE Take 1-2 tablets by mouth every 6 (six) hours as needed for headache.   Lantus SoloStar 100 UNIT/ML Solostar Pen Generic drug: insulin glargine Inject 10 Units into the skin at bedtime.   lisinopril 5 MG tablet Commonly known as: ZESTRIL Take 1 tablet (5 mg total) by mouth daily.   Magnesium 100 MG Caps Take 1 capsule by mouth daily.   multivitamin with minerals Tabs tablet Take 1 tablet by mouth daily.   omeprazole 20 MG tablet Commonly known as: PriLOSEC OTC Take 1 tablet (20 mg total) by mouth 2 (two) times daily before a meal.   simethicone 80 MG chewable tablet Commonly known as: MYLICON Chew 80 mg by mouth every 6 (six) hours as needed for flatulence.   thiamine 100 MG tablet Commonly known as: Vitamin B-1 Take 1 tablet (100 mg total) by mouth daily.        Follow-up Information     Ladell Pier, MD. Schedule an appointment as soon as possible for a visit in 1 week(s).   Specialty: Internal Medicine Contact information: [address] [place] [address] [phone]                Allergies  Allergen Reactions   Ibuprofen Nausea And Vomiting    Consultations: None    Procedures/Studies: CT ABDOMEN PELVIS W CONTRAST  Result Date: 01/03/2022 CLINICAL DATA:  Acute nonlocalized abdominal pain. Nausea and vomiting EXAM: CT ABDOMEN AND PELVIS WITH CONTRAST TECHNIQUE: Multidetector CT imaging of the abdomen and pelvis was performed using the standard protocol following bolus administration of intravenous contrast.  RADIATION DOSE REDUCTION: This exam was performed according to the departmental dose-optimization program which includes automated exposure control, adjustment of the mA and/or kV according to patient size and/or use of iterative reconstruction technique. CONTRAST:  132m OMNIPAQUE IOHEXOL 300 MG/ML  SOLN COMPARISON:  CT 12/04/2021 FINDINGS: Lower chest: No acute abnormality. Hepatobiliary: Marked hepatic steatosis. Distended gallbladder. No radiopaque stones. No biliary dilation. Pancreas: Marked atrophy.  No evidence of acute pancreatitis. Spleen: Normal in size without focal abnormality. Adrenals/Urinary Tract: Adrenal glands are unremarkable. Mild bilateral hydroureteronephrosis favored due to bladder distention. No obstructing stone. Stomach/Bowel: Stomach is within normal limits. Normal caliber large and small bowel. Diffuse fatty infiltration of the wall of the ascending, transverse, and descending colon is similar to prior and can be a normal finding or due to sequela of chronic inflammation. No evidence of acute inflammation. The appendix is normal. Vascular/Lymphatic: No significant vascular findings are present. No enlarged abdominal or pelvic lymph nodes. Reproductive: Uterus and bilateral adnexa are unremarkable. Other: No free intraperitoneal fluid or air. Musculoskeletal: No acute or significant osseous  findings. IMPRESSION: No acute abnormality in the abdomen or pelvis. Marked hepatic steatosis. Mild bilateral hydroureteronephrosis likely due to markedly distended bladder. No obstructing nephrolithiasis. Electronically Signed   By: Placido Sou M.D.   On: 01/03/2022 19:40   DG CHEST PORT 1 VIEW  Result Date: 01/03/2022 CLINICAL DATA:  Generalized body aches, fatigue, abdominal pain, and nausea EXAM: PORTABLE CHEST 1 VIEW COMPARISON:  Chest radiograph dated 12/24/2019 FINDINGS: Low lung volumes. No focal consolidations. No pleural effusion or pneumothorax. The heart size and mediastinal  contours are within normal limits. The visualized skeletal structures are unremarkable. IMPRESSION: No active disease. Electronically Signed   By: Darrin Nipper M.D.   On: 01/03/2022 16:34       Discharge Exam: Vitals:   01/05/22 0022 01/05/22 0427  BP: (!) 149/117 (!) 162/115  Pulse: 75 73  Resp: 14 14  Temp: 98.4 F (36.9 C) 98.3 F (36.8 C)  SpO2: 100% 100%    General: Pt is alert, awake, not in acute distress Cardiovascular: RRR, S1/S2 +, no edema Respiratory: CTA bilaterally, no wheezing, no rhonchi, no respiratory distress, no conversational dyspnea  Abdominal: Soft, NT, ND, bowel sounds + Extremities: no edema, no cyanosis Psych: Normal mood and affect, stable judgement and insight     The results of significant diagnostics from this hospitalization (including imaging, microbiology, ancillary and laboratory) are listed below for reference.     Microbiology: Recent Results (from the past 240 hour(s))  Resp panel by RT-PCR (RSV, Flu A&B, Covid) Anterior Nasal Swab     Status: None   Collection Time: 01/03/22  4:07 PM   Specimen: Anterior Nasal Swab  Result Value Ref Range Status   SARS Coronavirus 2 by RT PCR NEGATIVE NEGATIVE Final    Comment: (NOTE) SARS-CoV-2 target nucleic acids are NOT DETECTED.  The SARS-CoV-2 RNA is generally detectable in upper respiratory specimens during the acute phase of infection. The lowest concentration of SARS-CoV-2 viral copies this assay can detect is 138 copies/mL. A negative result does not preclude SARS-Cov-2 infection and should not be used as the sole basis for treatment or other patient management decisions. A negative result may occur with  improper specimen collection/handling, submission of specimen other than nasopharyngeal swab, presence of viral mutation(s) within the areas targeted by this assay, and inadequate number of viral copies(<138 copies/mL). A negative result must be combined with clinical observations, patient  history, and epidemiological information. The expected result is Negative.  Fact Sheet for Patients:  EntrepreneurPulse.com.au  Fact Sheet for Healthcare Providers:  IncredibleEmployment.be  This test is no t yet approved or cleared by the [place] FDA and  has been authorized for detection and/or diagnosis of SARS-CoV-2 by FDA under an Emergency Use Authorization (EUA). This EUA will remain  in effect (meaning this test can be used) for the duration of the COVID-19 declaration under Section 564(b)(1) of the Act, 21 U.S.C.section 360bbb-3(b)(1), unless the authorization is terminated  or revoked sooner.       Influenza A by PCR NEGATIVE NEGATIVE Final   Influenza B by PCR NEGATIVE NEGATIVE Final    Comment: (NOTE) The Xpert Xpress SARS-CoV-2/FLU/RSV plus assay is intended as an aid in the diagnosis of influenza from Nasopharyngeal swab specimens and should not be used as a sole basis for treatment. Nasal washings and aspirates are unacceptable for Xpert Xpress SARS-CoV-2/FLU/RSV testing.  Fact Sheet for Patients: EntrepreneurPulse.com.au  Fact Sheet for Healthcare Providers: IncredibleEmployment.be  This test is not yet approved or cleared by  the [employer] and has been authorized for detection and/or diagnosis of SARS-CoV-2 by FDA under an Emergency Use Authorization (EUA). This EUA will remain in effect (meaning this test can be used) for the duration of the COVID-19 declaration under Section 564(b)(1) of the Act, 21 U.S.C. section 360bbb-3(b)(1), unless the authorization is terminated or revoked.     Resp Syncytial Virus by PCR NEGATIVE NEGATIVE Final    Comment: (NOTE) Fact Sheet for Patients: EntrepreneurPulse.com.au  Fact Sheet for Healthcare Providers: IncredibleEmployment.be  This test is not yet approved or cleared by the [place] FDA  and has been authorized for detection and/or diagnosis of SARS-CoV-2 by FDA under an Emergency Use Authorization (EUA). This EUA will remain in effect (meaning this test can be used) for the duration of the COVID-19 declaration under Section 564(b)(1) of the Act, 21 U.S.C. section 360bbb-3(b)(1), unless the authorization is terminated or revoked.  Performed at [facility], [place] [address], [place], [place] [address]   Culture, blood (Routine X 2) w Reflex to ID Panel     Status: None (Preliminary result)   Collection Time: 01/04/22  8:17 AM   Specimen: BLOOD RIGHT ARM  Result Value Ref Range Status   Specimen Description   Final    BLOOD RIGHT ARM Performed at [facility], [address], [place], [place] [address]    Special Requests   Final    BOTTLES DRAWN AEROBIC AND ANAEROBIC Blood Culture adequate volume Performed at [facility] [address], [place], [place] [address]    Culture   Final    NO GROWTH < 24 HOURS Performed at [facility] [address] [place], [place] [address]    Report Status PENDING  Incomplete  Culture, blood (Routine X 2) w Reflex to ID Panel     Status: None (Preliminary result)   Collection Time: 01/04/22  8:20 AM   Specimen: BLOOD LEFT ARM  Result Value Ref Range Status   Specimen Description   Final    BLOOD LEFT ARM Performed at [facility], [place] [address], [place], [place] [address]    Special Requests   Final    BOTTLES DRAWN AEROBIC AND ANAEROBIC Blood Culture adequate volume Performed at [facility] [address] [place], [place] [address]    Culture   Final    NO GROWTH < 24 HOURS Performed at [facility] [address] [place], [place] [address]    Report Status PENDING  Incomplete     Labs: BNP (last 3 results) No results for input(s): "BNP" in the last 8760 hours. Basic Metabolic Panel: Recent Labs  Lab 01/03/22 2228  01/04/22 0127 01/04/22 0128 01/04/22 0707 01/04/22 0817 01/05/22 0553  NA 133*  --  134* 136 135 135  K 6.8*  --  6.1* 4.3 4.2 3.4*  CL 99  --  101 103 100 97*  CO2 12*  --  13* 21* 22 26  GLUCOSE 248*  --  216* 97 130* 94  BUN 14  --  '15 12 12 7  '$ CREATININE 1.36*  --  1.27* 0.89 0.89 0.80  CALCIUM 8.1*  --  8.2* 8.6* 8.7* 8.8*  MG  --  1.4*  --   --   --   --   PHOS  --  2.2*  --   --   --   --    Liver Function Tests: Recent  Labs  Lab 01/03/22 1700  AST 215*  ALT 79*  ALKPHOS 93  BILITOT 1.0  PROT 8.7*  ALBUMIN 4.2   Recent Labs  Lab 01/03/22 1700  LIPASE 22   No results for input(s): "AMMONIA" in the last 168 hours. CBC: Recent Labs  Lab 01/03/22 1700 01/04/22 0817 01/05/22 0553  WBC 16.0* 7.1 4.3  NEUTROABS 14.2*  --   --   HGB 12.7 10.6* 11.5*  HCT 39.3 31.8* 35.1*  MCV 95.6 91.4 94.6  PLT 365 218 178   Cardiac Enzymes: No results for input(s): "CKTOTAL", "CKMB", "CKMBINDEX", "TROPONINI" in the last 168 hours. BNP: Invalid input(s): "POCBNP" CBG: Recent Labs  Lab 01/04/22 1116 01/04/22 1231 01/04/22 1712 01/04/22 2037 01/05/22 0746  GLUCAP 97 107* 117* 93 113*   D-Dimer No results for input(s): "DDIMER" in the last 72 hours. Hgb A1c No results for input(s): "HGBA1C" in the last 72 hours. Lipid Profile No results for input(s): "CHOL", "HDL", "LDLCALC", "TRIG", "CHOLHDL", "LDLDIRECT" in the last 72 hours. Thyroid function studies No results for input(s): "TSH", "T4TOTAL", "T3FREE", "THYROIDAB" in the last 72 hours.  Invalid input(s): "FREET3" Anemia work up No results for input(s): "VITAMINB12", "FOLATE", "FERRITIN", "TIBC", "IRON", "RETICCTPCT" in the last 72 hours. Urinalysis    Component Value Date/Time   COLORURINE STRAW (A) 01/03/2022 1952   APPEARANCEUR CLEAR 01/03/2022 1952   LABSPEC 1.015 01/03/2022 1952   PHURINE 5.0 01/03/2022 1952   GLUCOSEU NEGATIVE 01/03/2022 1952   HGBUR LARGE (A) 01/03/2022 1952   BILIRUBINUR NEGATIVE  01/03/2022 1952   KETONESUR 80 (A) 01/03/2022 1952   PROTEINUR 100 (A) 01/03/2022 1952   UROBILINOGEN 2.0 (H) 12/31/2009 1757   NITRITE NEGATIVE 01/03/2022 1952   LEUKOCYTESUR NEGATIVE 01/03/2022 1952   Sepsis Labs Recent Labs  Lab 01/03/22 1700 01/04/22 0817 01/05/22 0553  WBC 16.0* 7.1 4.3   Microbiology Recent Results (from the past 240 hour(s))  Resp panel by RT-PCR (RSV, Flu A&B, Covid) Anterior Nasal Swab     Status: None   Collection Time: 01/03/22  4:07 PM   Specimen: Anterior Nasal Swab  Result Value Ref Range Status   SARS Coronavirus 2 by RT PCR NEGATIVE NEGATIVE Final    Comment: (NOTE) SARS-CoV-2 target nucleic acids are NOT DETECTED.  The SARS-CoV-2 RNA is generally detectable in upper respiratory specimens during the acute phase of infection. The lowest concentration of SARS-CoV-2 viral copies this assay can detect is 138 copies/mL. A negative result does not preclude SARS-Cov-2 infection and should not be used as the sole basis for treatment or other patient management decisions. A negative result may occur with  improper specimen collection/handling, submission of specimen other than nasopharyngeal swab, presence of viral mutation(s) within the areas targeted by this assay, and inadequate number of viral copies(<138 copies/mL). A negative result must be combined with clinical observations, patient history, and epidemiological information. The expected result is Negative.  Fact Sheet for Patients:  EntrepreneurPulse.com.au  Fact Sheet for Healthcare Providers:  IncredibleEmployment.be  This test is no t yet approved or cleared by the [place] FDA and  has been authorized for detection and/or diagnosis of SARS-CoV-2 by FDA under an Emergency Use Authorization (EUA). This EUA will remain  in effect (meaning this test can be used) for the duration of the COVID-19 declaration under Section 564(b)(1) of the Act,  21 U.S.C.section 360bbb-3(b)(1), unless the authorization is terminated  or revoked sooner.       Influenza A by PCR NEGATIVE NEGATIVE Final  Influenza B by PCR NEGATIVE NEGATIVE Final    Comment: (NOTE) The Xpert Xpress SARS-CoV-2/FLU/RSV plus assay is intended as an aid in the diagnosis of influenza from Nasopharyngeal swab specimens and should not be used as a sole basis for treatment. Nasal washings and aspirates are unacceptable for Xpert Xpress SARS-CoV-2/FLU/RSV testing.  Fact Sheet for Patients: EntrepreneurPulse.com.au  Fact Sheet for Healthcare Providers: IncredibleEmployment.be  This test is not yet approved or cleared by the [place] FDA and has been authorized for detection and/or diagnosis of SARS-CoV-2 by FDA under an Emergency Use Authorization (EUA). This EUA will remain in effect (meaning this test can be used) for the duration of the COVID-19 declaration under Section 564(b)(1) of the Act, 21 U.S.C. section 360bbb-3(b)(1), unless the authorization is terminated or revoked.     Resp Syncytial Virus by PCR NEGATIVE NEGATIVE Final    Comment: (NOTE) Fact Sheet for Patients: EntrepreneurPulse.com.au  Fact Sheet for Healthcare Providers: IncredibleEmployment.be  This test is not yet approved or cleared by the [place] FDA and has been authorized for detection and/or diagnosis of SARS-CoV-2 by FDA under an Emergency Use Authorization (EUA). This EUA will remain in effect (meaning this test can be used) for the duration of the COVID-19 declaration under Section 564(b)(1) of the Act, 21 U.S.C. section 360bbb-3(b)(1), unless the authorization is terminated or revoked.  Performed at [facility], [place] [address], [place], [place] [address]   Culture, blood (Routine X 2) w Reflex to ID Panel     Status: None (Preliminary result)   Collection Time: 01/04/22  8:17  AM   Specimen: BLOOD RIGHT ARM  Result Value Ref Range Status   Specimen Description   Final    BLOOD RIGHT ARM Performed at [facility], [address], [place], [place] [address]    Special Requests   Final    BOTTLES DRAWN AEROBIC AND ANAEROBIC Blood Culture adequate volume Performed at [facility] [address] [place], [place] [address]    Culture   Final    NO GROWTH < 24 HOURS Performed at [facility] [address] [place], [place] [address]    Report Status PENDING  Incomplete  Culture, blood (Routine X 2) w Reflex to ID Panel     Status: None (Preliminary result)   Collection Time: 01/04/22  8:20 AM   Specimen: BLOOD LEFT ARM  Result Value Ref Range Status   Specimen Description   Final    BLOOD LEFT ARM Performed at [facility], [place] [address], [place], [place] [address]    Special Requests   Final    BOTTLES DRAWN AEROBIC AND ANAEROBIC Blood Culture adequate volume Performed at [facility] [address] [place], [place] [address]    Culture   Final    NO GROWTH < 24 HOURS Performed at [facility] [address] [place], [place] [address]    Report Status PENDING  Incomplete     Patient was seen and examined on the day of discharge and was found to be in stable condition. Time coordinating discharge: 25 minutes including assessment and coordination of care, as well as examination of the patient.   SIGNED:  Dessa Phi, DO [facility] 01/05/2022, 8:56 AM

## 2022-01-07 ENCOUNTER — Telehealth: Payer: Self-pay | Admitting: *Deleted

## 2022-01-07 NOTE — Telephone Encounter (Signed)
Transition Care Management Unsuccessful Follow-up Telephone Call  Date of discharge and from where:  01/05/2022 [facility]  Attempts:  1st Attempt  Reason for unsuccessful TCM follow-up call:  Left voice message

## 2022-01-09 LAB — CULTURE, BLOOD (ROUTINE X 2)
Culture: NO GROWTH
Culture: NO GROWTH
Special Requests: ADEQUATE
Special Requests: ADEQUATE

## 2022-01-23 ENCOUNTER — Other Ambulatory Visit: Payer: Self-pay | Admitting: Internal Medicine

## 2022-01-23 DIAGNOSIS — I10 Essential (primary) hypertension: Secondary | ICD-10-CM

## 2022-02-03 ENCOUNTER — Ambulatory Visit: Payer: Self-pay | Attending: Internal Medicine | Admitting: Internal Medicine

## 2022-02-03 ENCOUNTER — Ambulatory Visit: Payer: Self-pay | Admitting: Internal Medicine

## 2022-02-17 NOTE — Progress Notes (Deleted)
Patient ID: [name], female   DOB: 1985-05-04, 37 y.o.   MRN: [id number]   After hospitalization 12/22-12/24/2024 Brief/Interim Summary: Yolanda Flores is a 37 y.o. female with medical history significant of T2DM, alcohol abuse, and hypertension, pancreatitis who presented to ED with complaint of generalized body aches, abdominal pain, n/v as well as fatigue. She was recently admitted 12/04/21--12/07/21 with diagnosis of alcohol induced acute pancreatitis as well as DKA.  Upon presentation to the emergency department, she was found to have tachycardia, leukocytosis, metabolic acidosis, hyperkalemia, lactic acidosis.  She was empirically started on IV antibiotics, IV insulin and admitted to the hospital.  She also required Foley catheter due to mild bilateral hydroureteronephrosis.  Foley catheter was removed and patient was able to void spontaneously.  She was transition from IV insulin to subcutaneous insulin regimen.  Sepsis was ruled out, no source of infection identified.  Labs trended back toward normal and on day of discharge, patient was feeling well and tolerating diet.   Discharge Diagnoses:    Principal Problem:   Metabolic acidosis, increased anion gap (IAG) Active Problems:   DKA (diabetic ketoacidosis) ([facility])   Essential hypertension   Diabetes mellitus type 2 in obese (HCC)   AKI (acute kidney injury) ([facility] [place])   High anion gap metabolic acidosis -Thought to be secondary to lactic acidosis, DKA -Patient was treated with bicarb drip, insulin drip, IV fluid -Resolved   DKA -Now off insulin drip, resume Semglee, sliding scale insulin   SIRS -Without clear source -COVID, influenza, RSV negative -Blood cultures are pending -Due to patient's presentation with lactic acidosis, she was started on empiric vancomycin, Zosyn.  Discontinued.  No source of infection identified.  Afebrile.  Sepsis ruled out   AKI -Resolved   Hyperkalemia -Resolved   Mild bilateral  hydroureteronephrosis -Foley catheter removed   Hypertension -Resume lisinopril   Alcohol abuse -CIWA protocol.  No sign of withdrawal at this time.   GERD -PPI

## 2022-02-19 ENCOUNTER — Ambulatory Visit: Payer: Self-pay | Attending: Physician Assistant | Admitting: Physician Assistant

## 2022-02-19 DIAGNOSIS — E1165 Type 2 diabetes mellitus with hyperglycemia: Secondary | ICD-10-CM

## 2022-02-28 ENCOUNTER — Inpatient Hospital Stay (HOSPITAL_COMMUNITY)
Admission: EM | Admit: 2022-02-28 | Discharge: 2022-03-03 | DRG: 639 | Disposition: A | Discharge status: Home / Self Care | Payer: Commercial Managed Care - HMO | Attending: Internal Medicine | Admitting: Internal Medicine

## 2022-02-28 ENCOUNTER — Other Ambulatory Visit: Payer: Self-pay

## 2022-02-28 ENCOUNTER — Emergency Department (HOSPITAL_COMMUNITY): Payer: Commercial Managed Care - HMO

## 2022-02-28 ENCOUNTER — Encounter (HOSPITAL_COMMUNITY): Payer: Self-pay

## 2022-02-28 DIAGNOSIS — Z79899 Other long term (current) drug therapy: Secondary | ICD-10-CM

## 2022-02-28 DIAGNOSIS — E119 Type 2 diabetes mellitus without complications: Secondary | ICD-10-CM

## 2022-02-28 DIAGNOSIS — E1159 Type 2 diabetes mellitus with other circulatory complications: Secondary | ICD-10-CM | POA: Diagnosis present

## 2022-02-28 DIAGNOSIS — E876 Hypokalemia: Secondary | ICD-10-CM | POA: Diagnosis not present

## 2022-02-28 DIAGNOSIS — E8729 Other acidosis: Secondary | ICD-10-CM | POA: Diagnosis present

## 2022-02-28 DIAGNOSIS — E869 Volume depletion, unspecified: Secondary | ICD-10-CM | POA: Diagnosis present

## 2022-02-28 DIAGNOSIS — Z87891 Personal history of nicotine dependence: Secondary | ICD-10-CM

## 2022-02-28 DIAGNOSIS — T730XXA Starvation, initial encounter: Secondary | ICD-10-CM | POA: Diagnosis present

## 2022-02-28 DIAGNOSIS — E1310 Other specified diabetes mellitus with ketoacidosis without coma: Principal | ICD-10-CM

## 2022-02-28 DIAGNOSIS — E1110 Type 2 diabetes mellitus with ketoacidosis without coma: Secondary | ICD-10-CM | POA: Diagnosis not present

## 2022-02-28 DIAGNOSIS — Y907 Blood alcohol level of 200-239 mg/100 ml: Secondary | ICD-10-CM | POA: Diagnosis present

## 2022-02-28 DIAGNOSIS — E785 Hyperlipidemia, unspecified: Secondary | ICD-10-CM | POA: Diagnosis present

## 2022-02-28 DIAGNOSIS — E875 Hyperkalemia: Secondary | ICD-10-CM | POA: Diagnosis present

## 2022-02-28 DIAGNOSIS — I152 Hypertension secondary to endocrine disorders: Secondary | ICD-10-CM | POA: Diagnosis present

## 2022-02-28 DIAGNOSIS — Z8249 Family history of ischemic heart disease and other diseases of the circulatory system: Secondary | ICD-10-CM

## 2022-02-28 DIAGNOSIS — Z833 Family history of diabetes mellitus: Secondary | ICD-10-CM

## 2022-02-28 DIAGNOSIS — Z1152 Encounter for screening for COVID-19: Secondary | ICD-10-CM

## 2022-02-28 DIAGNOSIS — F1020 Alcohol dependence, uncomplicated: Secondary | ICD-10-CM | POA: Diagnosis present

## 2022-02-28 DIAGNOSIS — Z794 Long term (current) use of insulin: Secondary | ICD-10-CM

## 2022-02-28 DIAGNOSIS — G43909 Migraine, unspecified, not intractable, without status migrainosus: Secondary | ICD-10-CM | POA: Diagnosis present

## 2022-02-28 DIAGNOSIS — E1169 Type 2 diabetes mellitus with other specified complication: Secondary | ICD-10-CM | POA: Diagnosis present

## 2022-02-28 DIAGNOSIS — X58XXXA Exposure to other specified factors, initial encounter: Secondary | ICD-10-CM | POA: Diagnosis present

## 2022-02-28 DIAGNOSIS — K760 Fatty (change of) liver, not elsewhere classified: Secondary | ICD-10-CM | POA: Diagnosis present

## 2022-02-28 DIAGNOSIS — K828 Other specified diseases of gallbladder: Secondary | ICD-10-CM | POA: Diagnosis present

## 2022-02-28 LAB — CBC WITH DIFFERENTIAL/PLATELET
Abs Immature Granulocytes: 0.04 10*3/uL (ref 0.00–0.07)
Basophils Absolute: 0.1 10*3/uL (ref 0.0–0.1)
Basophils Relative: 1 %
Eosinophils Absolute: 0.1 10*3/uL (ref 0.0–0.5)
Eosinophils Relative: 1 %
HCT: 38 % (ref 36.0–46.0)
Hemoglobin: 13 g/dL (ref 12.0–15.0)
Immature Granulocytes: 0 %
Lymphocytes Relative: 16 %
Lymphs Abs: 1.9 10*3/uL (ref 0.7–4.0)
MCH: 28.6 pg (ref 26.0–34.0)
MCHC: 34.2 g/dL (ref 30.0–36.0)
MCV: 83.7 fL (ref 80.0–100.0)
Monocytes Absolute: 0.6 10*3/uL (ref 0.1–1.0)
Monocytes Relative: 5 %
Neutro Abs: 9.5 10*3/uL — ABNORMAL HIGH (ref 1.7–7.7)
Neutrophils Relative %: 77 %
Platelets: 442 10*3/uL — ABNORMAL HIGH (ref 150–400)
RBC: 4.54 MIL/uL (ref 3.87–5.11)
RDW: 14 % (ref 11.5–15.5)
WBC: 12.2 10*3/uL — ABNORMAL HIGH (ref 4.0–10.5)
nRBC: 0 % (ref 0.0–0.2)

## 2022-02-28 LAB — BASIC METABOLIC PANEL
Anion gap: 18 — ABNORMAL HIGH (ref 5–15)
BUN: 12 mg/dL (ref 6–20)
CO2: 16 mmol/L — ABNORMAL LOW (ref 22–32)
Calcium: 7.6 mg/dL — ABNORMAL LOW (ref 8.9–10.3)
Chloride: 102 mmol/L (ref 98–111)
Creatinine, Ser: 0.79 mg/dL (ref 0.44–1.00)
GFR, Estimated: >60 mL/min (ref >60)
Glucose, Bld: 114 mg/dL — ABNORMAL HIGH (ref 70–99)
Potassium: 5.3 mmol/L — ABNORMAL HIGH (ref 3.5–5.1)
Sodium: 136 mmol/L (ref 135–145)

## 2022-02-28 LAB — COMPREHENSIVE METABOLIC PANEL
ALT: 19 U/L (ref 0–44)
AST: 36 U/L (ref 15–41)
Albumin: 4 g/dL (ref 3.5–5.0)
Alkaline Phosphatase: 81 U/L (ref 38–126)
Anion gap: 26 — ABNORMAL HIGH (ref 5–15)
BUN: 15 mg/dL (ref 6–20)
CO2: 14 mmol/L — ABNORMAL LOW (ref 22–32)
Calcium: 8.5 mg/dL — ABNORMAL LOW (ref 8.9–10.3)
Chloride: 97 mmol/L — ABNORMAL LOW (ref 98–111)
Creatinine, Ser: 0.94 mg/dL (ref 0.44–1.00)
GFR, Estimated: >60 mL/min (ref >60)
Glucose, Bld: 117 mg/dL — ABNORMAL HIGH (ref 70–99)
Potassium: 4.3 mmol/L (ref 3.5–5.1)
Sodium: 137 mmol/L (ref 135–145)
Total Bilirubin: 0.6 mg/dL (ref 0.3–1.2)
Total Protein: 8.2 g/dL — ABNORMAL HIGH (ref 6.5–8.1)

## 2022-02-28 LAB — I-STAT BETA HCG BLOOD, ED (MC, WL, AP ONLY): I-stat hCG, quantitative: <5 m[IU]/mL (ref <5)

## 2022-02-28 LAB — I-STAT VENOUS BLOOD GAS, ED
Acid-base deficit: 9 mmol/L — ABNORMAL HIGH (ref 0.0–2.0)
Bicarbonate: 16.5 mmol/L — ABNORMAL LOW (ref 20.0–28.0)
Calcium, Ion: 0.98 mmol/L — ABNORMAL LOW (ref 1.15–1.40)
HCT: 41 % (ref 36.0–46.0)
Hemoglobin: 13.9 g/dL (ref 12.0–15.0)
O2 Saturation: 91 %
Potassium: 4.5 mmol/L (ref 3.5–5.1)
Sodium: 136 mmol/L (ref 135–145)
TCO2: 18 mmol/L — ABNORMAL LOW (ref 22–32)
pCO2, Ven: 34.7 mmHg — ABNORMAL LOW (ref 44–60)
pH, Ven: 7.286 (ref 7.25–7.43)
pO2, Ven: 67 mmHg — ABNORMAL HIGH (ref 32–45)

## 2022-02-28 LAB — LIPASE, BLOOD: Lipase: 20 U/L (ref 11–51)

## 2022-02-28 LAB — RESP PANEL BY RT-PCR (RSV, FLU A&B, COVID)  RVPGX2
Influenza A by PCR: NEGATIVE
Influenza B by PCR: NEGATIVE
Resp Syncytial Virus by PCR: NEGATIVE
SARS Coronavirus 2 by RT PCR: NEGATIVE

## 2022-02-28 LAB — BRAIN NATRIURETIC PEPTIDE: B Natriuretic Peptide: 6.5 pg/mL (ref 0.0–100.0)

## 2022-02-28 LAB — BETA-HYDROXYBUTYRIC ACID: Beta-Hydroxybutyric Acid: 3.63 mmol/L — ABNORMAL HIGH (ref 0.05–0.27)

## 2022-02-28 MED ORDER — SODIUM CHLORIDE 0.9 % IV BOLUS
1000.0000 mL | Freq: Once | INTRAVENOUS | Status: AC
  Administered 2022-02-28: 1000 mL via INTRAVENOUS

## 2022-02-28 MED ORDER — METOCLOPRAMIDE HCL 5 MG/ML IJ SOLN
10.0000 mg | Freq: Once | INTRAMUSCULAR | Status: AC
  Administered 2022-02-28: 10 mg via INTRAVENOUS
  Filled 2022-02-28: qty 2

## 2022-02-28 MED ORDER — ACETAMINOPHEN 500 MG PO TABS
1000.0000 mg | ORAL_TABLET | Freq: Once | ORAL | Status: AC
  Administered 2022-02-28: 1000 mg via ORAL
  Filled 2022-02-28: qty 2

## 2022-02-28 MED ORDER — DIPHENHYDRAMINE HCL 50 MG/ML IJ SOLN
25.0000 mg | Freq: Once | INTRAMUSCULAR | Status: AC
  Administered 2022-02-28: 25 mg via INTRAVENOUS
  Filled 2022-02-28: qty 1

## 2022-02-28 NOTE — ED Provider Notes (Signed)
[facility] Provider Note   CSN: [id number] Arrival date & time: 02/28/22  1925     History {Add pertinent medical, surgical, social history, OB history to HPI:1} Chief Complaint  Patient presents with   Tachycardia    Yolanda Flores is a 37 y.o. female with history of type 2 diabetes on insulin, alcoholic pancreatitis, hypertension, presenting to the emergency department with generalized weakness, muscle aches, shortness of breath.  Patient reports she has had symptoms of fatigue, muscle aches all over for the past 3 to 4 days.  She feels very thirsty.  She denies excessive urination.  She denies headache, sore throat, cough, fevers or chills.  She denies any alcohol binging recently does not have any other symptoms were prior alcoholic pancreatitis, most recently for which she was hospitalized 2 months ago.  She reports compliance with her insulin and takes 10 units daily.  She had 2 negative home COVID test.  She does work as a Quarry manager around sick patients.  EMS gave the patient a small fluid bolus en route to the hospital for her tachycardia.  She notes that she is "always told that my heart rate is really high" when she is in the hospital.  HPI     Home Medications Prior to Admission medications   Medication Sig Start Date End Date Taking? Authorizing Provider  aspirin-acetaminophen-caffeine (EXCEDRIN MIGRAINE) [phone] MG tablet Take 1-2 tablets by mouth every 6 (six) hours as needed for headache.    [provider]  insulin glargine (LANTUS SOLOSTAR) 100 UNIT/ML Solostar Pen Inject 10 Units into the skin at bedtime. 05/13/21   Ladell Pier, MD  lisinopril (ZESTRIL) 5 MG tablet Take 1 tablet (5 mg total) by mouth daily. 12/07/21   Geradine Girt, DO  Magnesium 100 MG CAPS Take 1 capsule by mouth daily.    [provider]  Multiple Vitamin (MULTIVITAMIN WITH MINERALS) TABS tablet Take 1 tablet by mouth daily.  01/18/19   Hosie Poisson, MD  omeprazole (PRILOSEC OTC) 20 MG tablet Take 1 tablet (20 mg total) by mouth 2 (two) times daily before a meal. 01/05/22   Dessa Phi, DO  simethicone (MYLICON) 80 MG chewable tablet Chew 80 mg by mouth every 6 (six) hours as needed for flatulence.    [provider]  thiamine (VITAMIN B-1) 100 MG tablet Take 1 tablet (100 mg total) by mouth daily. Patient not taking: Reported on 01/03/2022 12/07/21   Geradine Girt, DO      Allergies    Ibuprofen    Review of Systems   Review of Systems  Physical Exam Updated Vital Signs BP (!) 129/93   Pulse (!) 111   Temp 98.8 F (37.1 C) (Oral)   Resp (!) 22   Ht 5' 7"$  (1.702 m)   Wt 90.3 kg   LMP 02/03/2022 (Approximate)   SpO2 95%   BMI 31.17 kg/m  Physical Exam Constitutional:      General: She is not in acute distress. HENT:     Head: Normocephalic and atraumatic.  Eyes:     Conjunctiva/sclera: Conjunctivae normal.     Pupils: Pupils are equal, round, and reactive to light.  Cardiovascular:     Rate and Rhythm: Regular rhythm. Tachycardia present.  Pulmonary:     Effort: Pulmonary effort is normal. No respiratory distress.  Abdominal:     General: There is no distension.     Tenderness: There is no abdominal tenderness.  Skin:    General: Skin is warm and dry.  Neurological:     General: No focal deficit present.     Mental Status: She is alert. Mental status is at baseline.  Psychiatric:        Mood and Affect: Mood normal.        Behavior: Behavior normal.     ED Results / Procedures / Treatments   Labs (all labs ordered are listed, but only abnormal results are displayed) Labs Reviewed  CBC WITH DIFFERENTIAL/PLATELET - Abnormal; Notable for the following components:      Result Value   WBC 12.2 (*)    Platelets 442 (*)    Neutro Abs 9.5 (*)    All other components within normal limits  COMPREHENSIVE METABOLIC PANEL - Abnormal; Notable for the following components:    Chloride 97 (*)    CO2 14 (*)    Glucose, Bld 117 (*)    Calcium 8.5 (*)    Total Protein 8.2 (*)    Anion gap 26 (*)    All other components within normal limits  BETA-HYDROXYBUTYRIC ACID - Abnormal; Notable for the following components:   Beta-Hydroxybutyric Acid 3.63 (*)    All other components within normal limits  I-STAT VENOUS BLOOD GAS, ED - Abnormal; Notable for the following components:   pCO2, Ven 34.7 (*)    pO2, Ven 67 (*)    Bicarbonate 16.5 (*)    TCO2 18 (*)    Acid-base deficit 9.0 (*)    Calcium, Ion 0.98 (*)    All other components within normal limits  RESP PANEL BY RT-PCR (RSV, FLU A&B, COVID)  RVPGX2  BRAIN NATRIURETIC PEPTIDE  LIPASE, BLOOD  URINALYSIS, ROUTINE W REFLEX MICROSCOPIC  BASIC METABOLIC PANEL  I-STAT BETA HCG BLOOD, ED ([facility], [facility], [facility] ONLY)    EKG EKG Interpretation  Date/Time:  Friday February 28 2022 19:31:38 EST Ventricular Rate:  153 PR Interval:  130 QRS Duration: 78 QT Interval:  314 QTC Calculation: 501 R Axis:   -32 Text Interpretation: Sinus tachycardia Left axis deviation Abnormal ECG When compared with ECG of 04-Jan-2022 01:06, PREVIOUS ECG IS PRESENT Confirmed by Octaviano Glow [id number]) on 02/28/2022 8:04:35 PM  Radiology DG Chest 2 View  Result Date: 02/28/2022 CLINICAL DATA:  Shortness of breath. EXAM: CHEST - 2 VIEW COMPARISON:  Chest radiograph dated 01/03/2022. FINDINGS: The heart size and mediastinal contours are within normal limits. Both lungs are clear. The visualized skeletal structures are unremarkable. IMPRESSION: No active cardiopulmonary disease. Electronically Signed   By: Anner Crete M.D.   On: 02/28/2022 21:08    Procedures Procedures  {Document cardiac monitor, telemetry assessment procedure when appropriate:1}  Medications Ordered in ED Medications  sodium chloride 0.9 % bolus 1,000 mL (0 mLs Intravenous Stopped 02/28/22 2157)  sodium chloride 0.9 % bolus 1,000 mL (1,000 mLs Intravenous New Bag/Given  02/28/22 2157)  metoCLOPramide (REGLAN) injection 10 mg (10 mg Intravenous Given 02/28/22 2346)  acetaminophen (TYLENOL) tablet 1,000 mg (1,000 mg Oral Given 02/28/22 2346)  diphenhydrAMINE (BENADRYL) injection 25 mg (25 mg Intravenous Given 02/28/22 2345)    ED Course/ Medical Decision Making/ A&P Clinical Course as of 02/28/22 2349  Fri Feb 28, 2022  2343 HR 120 bpm [MT]    Clinical Course User Index [MT] Wyvonnia Dusky, MD   {   Click here for ABCD2, HEART and other calculatorsREFRESH Note before signing :1}  Medical Decision Making Amount and/or Complexity of Data Reviewed Labs: ordered. Radiology: ordered.  Risk OTC drugs. Prescription drug management. Decision regarding hospitalization.   This patient presents to the ED with concern for excessive thirst, tachycardia, weakness. This involves an extensive number of treatment options, and is a complaint that carries with it a high risk of complications and morbidity.  The differential diagnosis includes DKA versus infection versus other  Co-morbidities that complicate the patient evaluation: History of diabetes at high risk of complication  Additional history obtained from patient's partner at the bedside  External records from outside source obtained and reviewed including hospital discharge summary from 01/05/22 after medical admission for metabolic acidosis and DKA felt to be secondary to alcohol induced acute pancreatitis.  At that time sepsis was ruled out.  The patient initially tachycardic was discharged with a normal heart rate documented at 70's bpm.  She was discharged on 10 units of insulin.  Patient reports compliance with her insulin and takes it every morning.  I ordered and personally interpreted labs.  The pertinent results include: pH is 7.28.  Bicarb is 16.5.  Beta hydroxybutyrate level elevated at 3.6.  Anion gap of 26 on initial labs, pending repeat after fluids.  Creatinine 0.9  I  ordered imaging studies including x-ray of the chest I independently visualized and interpreted imaging which showed no acute infiltrate or abnormality I agree with the radiologist interpretation  The patient was maintained on a cardiac monitor.  I personally viewed and interpreted the cardiac monitored which showed an underlying rhythm of: Sinus tachycardia  Per my interpretation the patient's ECG shows sinus tachycardia  I ordered medication including 2 L of IV fluids ordered here, in addition to the 500 cc of fluid given by EMS prior to arrival.  Patient was subsequently given some IV headache medication she began having a headache in the ED.  I have a low suspicion for meningitis or SAH, and I do not see an indication for emergent neuroimaging.  I have reviewed the patients home medicines and have made adjustments as needed  Test Considered: Low suspicion for acute PE, abdominal infection or sepsis.  After the interventions noted above, I reevaluated the patient and found that they have: improved -patient reports that overall she feels that her fatigue had improved with fluids.  She continues to have a headache though and feels excessively thirsty.  She also remains tachycardic here.  I am concerned based on her blood test and her presentation that the patient is back in diabetic ketosis, and likely would be in diabetic ketoacidosis within a day or 2 with this progression of her symptoms.  It is not clear what is driving this particular presentation, as there was no report of alcohol consumption and no evidence of acute alcoholic pancreatitis, and she also reports compliance with her insulin medication at home.  There has been no clear source of infection, although we are awaiting UA at the time of admission.  I am also awaiting a repeat BMP after IV fluid boluses.  If anion gap is closed, I do not believe the patient would be requiring an insulin infusion.  I will discuss this with the  hospitalist as well.  Dispostion:  After consideration of the diagnostic results and the patients response to treatment, I feel that the patent would benefit from medical admission.   {Document critical care time when appropriate:1} {Document review of labs and clinical decision tools ie heart score, Chads2Vasc2 etc:1}  {Document your  independent review of radiology images, and any outside records:1} {Document your discussion with family members, caretakers, and with consultants:1} {Document social determinants of health affecting pt's care:1} {Document your decision making why or why not admission, treatments were needed:1} Final Clinical Impression(s) / ED Diagnoses Final diagnoses:  Diabetic ketosis ([place])    Rx / DC Orders ED Discharge Orders     None

## 2022-02-28 NOTE — ED Triage Notes (Signed)
PER EMS: pt reports weakness, shortness of breath x 3 days. Ems arrived to find her HR 170. Pt denies any pain. Denies additional complaints.   BP 190/100. 500 cc NS. Pts HR decreased to 134. 02-97% RA. CBG-95.

## 2022-03-01 ENCOUNTER — Other Ambulatory Visit: Payer: Self-pay

## 2022-03-01 ENCOUNTER — Inpatient Hospital Stay (HOSPITAL_COMMUNITY): Payer: Commercial Managed Care - HMO

## 2022-03-01 ENCOUNTER — Encounter (HOSPITAL_COMMUNITY): Payer: Self-pay | Admitting: Internal Medicine

## 2022-03-01 DIAGNOSIS — Z794 Long term (current) use of insulin: Secondary | ICD-10-CM | POA: Diagnosis not present

## 2022-03-01 DIAGNOSIS — F1020 Alcohol dependence, uncomplicated: Secondary | ICD-10-CM | POA: Diagnosis present

## 2022-03-01 DIAGNOSIS — Z833 Family history of diabetes mellitus: Secondary | ICD-10-CM | POA: Diagnosis not present

## 2022-03-01 DIAGNOSIS — E1310 Other specified diabetes mellitus with ketoacidosis without coma: Secondary | ICD-10-CM | POA: Diagnosis present

## 2022-03-01 DIAGNOSIS — E875 Hyperkalemia: Secondary | ICD-10-CM | POA: Diagnosis not present

## 2022-03-01 DIAGNOSIS — Z79899 Other long term (current) drug therapy: Secondary | ICD-10-CM | POA: Diagnosis not present

## 2022-03-01 DIAGNOSIS — E869 Volume depletion, unspecified: Secondary | ICD-10-CM | POA: Diagnosis present

## 2022-03-01 DIAGNOSIS — G43909 Migraine, unspecified, not intractable, without status migrainosus: Secondary | ICD-10-CM | POA: Diagnosis present

## 2022-03-01 DIAGNOSIS — Z8249 Family history of ischemic heart disease and other diseases of the circulatory system: Secondary | ICD-10-CM | POA: Diagnosis not present

## 2022-03-01 DIAGNOSIS — E1169 Type 2 diabetes mellitus with other specified complication: Secondary | ICD-10-CM | POA: Diagnosis present

## 2022-03-01 DIAGNOSIS — E119 Type 2 diabetes mellitus without complications: Secondary | ICD-10-CM

## 2022-03-01 DIAGNOSIS — E1110 Type 2 diabetes mellitus with ketoacidosis without coma: Secondary | ICD-10-CM | POA: Diagnosis present

## 2022-03-01 DIAGNOSIS — E8729 Other acidosis: Secondary | ICD-10-CM

## 2022-03-01 DIAGNOSIS — E785 Hyperlipidemia, unspecified: Secondary | ICD-10-CM | POA: Diagnosis present

## 2022-03-01 DIAGNOSIS — K828 Other specified diseases of gallbladder: Secondary | ICD-10-CM | POA: Diagnosis present

## 2022-03-01 DIAGNOSIS — T730XXA Starvation, initial encounter: Secondary | ICD-10-CM | POA: Diagnosis present

## 2022-03-01 DIAGNOSIS — K760 Fatty (change of) liver, not elsewhere classified: Secondary | ICD-10-CM | POA: Diagnosis present

## 2022-03-01 DIAGNOSIS — X58XXXA Exposure to other specified factors, initial encounter: Secondary | ICD-10-CM | POA: Diagnosis present

## 2022-03-01 DIAGNOSIS — E876 Hypokalemia: Secondary | ICD-10-CM | POA: Diagnosis not present

## 2022-03-01 DIAGNOSIS — Y907 Blood alcohol level of 200-239 mg/100 ml: Secondary | ICD-10-CM | POA: Diagnosis present

## 2022-03-01 DIAGNOSIS — Z1152 Encounter for screening for COVID-19: Secondary | ICD-10-CM | POA: Diagnosis not present

## 2022-03-01 DIAGNOSIS — Z87891 Personal history of nicotine dependence: Secondary | ICD-10-CM | POA: Diagnosis not present

## 2022-03-01 DIAGNOSIS — I152 Hypertension secondary to endocrine disorders: Secondary | ICD-10-CM | POA: Diagnosis present

## 2022-03-01 LAB — BLOOD GAS, ARTERIAL
Acid-base deficit: 13.2 mmol/L — ABNORMAL HIGH (ref 0.0–2.0)
Bicarbonate: 11.7 mmol/L — ABNORMAL LOW (ref 20.0–28.0)
O2 Saturation: 98.2 %
Patient temperature: 37
pCO2 arterial: 25 mmHg — ABNORMAL LOW (ref 32–48)
pH, Arterial: 7.28 — ABNORMAL LOW (ref 7.35–7.45)
pO2, Arterial: 106 mmHg (ref 83–108)

## 2022-03-01 LAB — BASIC METABOLIC PANEL
Anion gap: 18 — ABNORMAL HIGH (ref 5–15)
Anion gap: 20 — ABNORMAL HIGH (ref 5–15)
Anion gap: 22 — ABNORMAL HIGH (ref 5–15)
Anion gap: 20 — ABNORMAL HIGH (ref 5–15)
Anion gap: 15 (ref 5–15)
BUN: 11 mg/dL (ref 6–20)
BUN: 8 mg/dL (ref 6–20)
BUN: 7 mg/dL (ref 6–20)
BUN: 7 mg/dL (ref 6–20)
BUN: 6 mg/dL (ref 6–20)
CO2: 15 mmol/L — ABNORMAL LOW (ref 22–32)
CO2: 13 mmol/L — ABNORMAL LOW (ref 22–32)
CO2: 11 mmol/L — ABNORMAL LOW (ref 22–32)
CO2: 11 mmol/L — ABNORMAL LOW (ref 22–32)
CO2: 19 mmol/L — ABNORMAL LOW (ref 22–32)
Calcium: 7.8 mg/dL — ABNORMAL LOW (ref 8.9–10.3)
Calcium: 7.8 mg/dL — ABNORMAL LOW (ref 8.9–10.3)
Calcium: 8.3 mg/dL — ABNORMAL LOW (ref 8.9–10.3)
Calcium: 8 mg/dL — ABNORMAL LOW (ref 8.9–10.3)
Calcium: 8.3 mg/dL — ABNORMAL LOW (ref 8.9–10.3)
Chloride: 101 mmol/L (ref 98–111)
Chloride: 97 mmol/L — ABNORMAL LOW (ref 98–111)
Chloride: 97 mmol/L — ABNORMAL LOW (ref 98–111)
Chloride: 97 mmol/L — ABNORMAL LOW (ref 98–111)
Chloride: 94 mmol/L — ABNORMAL LOW (ref 98–111)
Creatinine, Ser: 0.83 mg/dL (ref 0.44–1.00)
Creatinine, Ser: 0.8 mg/dL (ref 0.44–1.00)
Creatinine, Ser: 1.01 mg/dL — ABNORMAL HIGH (ref 0.44–1.00)
Creatinine, Ser: 1.1 mg/dL — ABNORMAL HIGH (ref 0.44–1.00)
Creatinine, Ser: 1.01 mg/dL — ABNORMAL HIGH (ref 0.44–1.00)
GFR, Estimated: >60 mL/min (ref >60)
GFR, Estimated: >60 mL/min (ref >60)
GFR, Estimated: >60 mL/min (ref >60)
GFR, Estimated: >60 mL/min (ref >60)
GFR, Estimated: >60 mL/min (ref >60)
Glucose, Bld: 106 mg/dL — ABNORMAL HIGH (ref 70–99)
Glucose, Bld: 97 mg/dL (ref 70–99)
Glucose, Bld: 121 mg/dL — ABNORMAL HIGH (ref 70–99)
Glucose, Bld: 178 mg/dL — ABNORMAL HIGH (ref 70–99)
Glucose, Bld: 202 mg/dL — ABNORMAL HIGH (ref 70–99)
Potassium: 4.9 mmol/L (ref 3.5–5.1)
Potassium: 4.9 mmol/L (ref 3.5–5.1)
Potassium: 5.2 mmol/L — ABNORMAL HIGH (ref 3.5–5.1)
Potassium: 5 mmol/L (ref 3.5–5.1)
Potassium: 4.3 mmol/L (ref 3.5–5.1)
Sodium: 134 mmol/L — ABNORMAL LOW (ref 135–145)
Sodium: 130 mmol/L — ABNORMAL LOW (ref 135–145)
Sodium: 130 mmol/L — ABNORMAL LOW (ref 135–145)
Sodium: 128 mmol/L — ABNORMAL LOW (ref 135–145)
Sodium: 128 mmol/L — ABNORMAL LOW (ref 135–145)

## 2022-03-01 LAB — URINALYSIS, ROUTINE W REFLEX MICROSCOPIC
Bilirubin Urine: NEGATIVE
Glucose, UA: NEGATIVE mg/dL
Ketones, ur: 20 mg/dL — AB
Nitrite: NEGATIVE
Protein, ur: 100 mg/dL — AB
RBC / HPF: >50 RBC/hpf (ref 0–5)
Specific Gravity, Urine: 1.012 (ref 1.005–1.030)
pH: 5 (ref 5.0–8.0)

## 2022-03-01 LAB — LIPID PANEL
Cholesterol: 355 mg/dL — ABNORMAL HIGH (ref 0–200)
HDL: 80 mg/dL (ref >40)
LDL Cholesterol: UNDETERMINED mg/dL (ref 0–99)
Total CHOL/HDL Ratio: 4.4 RATIO
Triglycerides: 504 mg/dL — ABNORMAL HIGH (ref <150)
VLDL: UNDETERMINED mg/dL (ref 0–40)

## 2022-03-01 LAB — CBC
HCT: 34.4 % — ABNORMAL LOW (ref 36.0–46.0)
Hemoglobin: 11.8 g/dL — ABNORMAL LOW (ref 12.0–15.0)
MCH: 29.1 pg (ref 26.0–34.0)
MCHC: 34.3 g/dL (ref 30.0–36.0)
MCV: 84.7 fL (ref 80.0–100.0)
Platelets: 314 10*3/uL (ref 150–400)
RBC: 4.06 MIL/uL (ref 3.87–5.11)
RDW: 14 % (ref 11.5–15.5)
WBC: 8.3 10*3/uL (ref 4.0–10.5)
nRBC: 0 % (ref 0.0–0.2)

## 2022-03-01 LAB — GLUCOSE, CAPILLARY
Glucose-Capillary: 110 mg/dL — ABNORMAL HIGH (ref 70–99)
Glucose-Capillary: 98 mg/dL (ref 70–99)
Glucose-Capillary: 89 mg/dL (ref 70–99)
Glucose-Capillary: 199 mg/dL — ABNORMAL HIGH (ref 70–99)
Glucose-Capillary: 204 mg/dL — ABNORMAL HIGH (ref 70–99)
Glucose-Capillary: 203 mg/dL — ABNORMAL HIGH (ref 70–99)
Glucose-Capillary: 192 mg/dL — ABNORMAL HIGH (ref 70–99)
Glucose-Capillary: 178 mg/dL — ABNORMAL HIGH (ref 70–99)

## 2022-03-01 LAB — MAGNESIUM
Magnesium: 1.5 mg/dL — ABNORMAL LOW (ref 1.7–2.4)
Magnesium: 1.7 mg/dL (ref 1.7–2.4)

## 2022-03-01 LAB — RAPID URINE DRUG SCREEN, HOSP PERFORMED
Amphetamines: NOT DETECTED
Barbiturates: NOT DETECTED
Benzodiazepines: NOT DETECTED
Cocaine: NOT DETECTED
Opiates: NOT DETECTED
Tetrahydrocannabinol: NOT DETECTED

## 2022-03-01 LAB — OSMOLALITY: Osmolality: 291 mOsm/kg (ref 275–295)

## 2022-03-01 LAB — PHOSPHORUS: Phosphorus: 2.2 mg/dL — ABNORMAL LOW (ref 2.5–4.6)

## 2022-03-01 LAB — HEMOGLOBIN A1C
Hgb A1c MFr Bld: 9.1 % — ABNORMAL HIGH (ref 4.8–5.6)
Mean Plasma Glucose: 214.47 mg/dL

## 2022-03-01 LAB — LDL CHOLESTEROL, DIRECT: Direct LDL: 189 mg/dL — ABNORMAL HIGH (ref 0–99)

## 2022-03-01 LAB — ETHANOL: Alcohol, Ethyl (B): 219 mg/dL — ABNORMAL HIGH (ref <10)

## 2022-03-01 LAB — LACTIC ACID, PLASMA
Lactic Acid, Venous: 4 mmol/L (ref 0.5–1.9)
Lactic Acid, Venous: 3.5 mmol/L (ref 0.5–1.9)

## 2022-03-01 LAB — BETA-HYDROXYBUTYRIC ACID
Beta-Hydroxybutyric Acid: >8 mmol/L — ABNORMAL HIGH (ref 0.05–0.27)
Beta-Hydroxybutyric Acid: 5.21 mmol/L — ABNORMAL HIGH (ref 0.05–0.27)

## 2022-03-01 LAB — TSH: TSH: 0.376 u[IU]/mL (ref 0.350–4.500)

## 2022-03-01 MED ORDER — PANTOPRAZOLE SODIUM 40 MG PO TBEC
40.0000 mg | DELAYED_RELEASE_TABLET | Freq: Every day | ORAL | Status: DC
  Administered 2022-03-01 – 2022-03-03 (×3): 40 mg via ORAL
  Filled 2022-03-01 (×3): qty 1

## 2022-03-01 MED ORDER — MAGNESIUM SULFATE 4 GM/100ML IV SOLN
4.0000 g | Freq: Once | INTRAVENOUS | Status: AC
  Administered 2022-03-01: 4 g via INTRAVENOUS
  Filled 2022-03-01: qty 100

## 2022-03-01 MED ORDER — ONDANSETRON HCL 4 MG/2ML IJ SOLN
4.0000 mg | Freq: Four times a day (QID) | INTRAMUSCULAR | Status: DC | PRN
  Administered 2022-03-01 (×2): 4 mg via INTRAVENOUS
  Filled 2022-03-01 (×2): qty 2

## 2022-03-01 MED ORDER — SODIUM CHLORIDE 0.9 % IV SOLN: INTRAVENOUS | Status: AC

## 2022-03-01 MED ORDER — ORAL CARE MOUTH RINSE: 15.0000 mL | OROMUCOSAL | Status: DC | PRN

## 2022-03-01 MED ORDER — DEXTROSE 5 % IV SOLN: INTRAVENOUS | Status: DC

## 2022-03-01 MED ORDER — DEXTROSE 50 % IV SOLN: 0.0000 mL | INTRAVENOUS | Status: DC | PRN

## 2022-03-01 MED ORDER — LACTATED RINGERS IV BOLUS
20.0000 mL/kg | Freq: Once | INTRAVENOUS | Status: AC
  Administered 2022-03-01: 1776 mL via INTRAVENOUS

## 2022-03-01 MED ORDER — LISINOPRIL 5 MG PO TABS
2.5000 mg | ORAL_TABLET | Freq: Every day | ORAL | Status: DC
  Administered 2022-03-01 – 2022-03-03 (×3): 2.5 mg via ORAL
  Filled 2022-03-01 (×3): qty 1

## 2022-03-01 MED ORDER — SODIUM CHLORIDE 0.9 % IV BOLUS
1000.0000 mL | Freq: Once | INTRAVENOUS | Status: AC
  Administered 2022-03-01: 1000 mL via INTRAVENOUS

## 2022-03-01 MED ORDER — INSULIN REGULAR(HUMAN) IN NACL 100-0.9 UT/100ML-% IV SOLN
INTRAVENOUS | Status: DC
  Filled 2022-03-01: qty 100

## 2022-03-01 MED ORDER — SODIUM CHLORIDE 0.9% FLUSH
3.0000 mL | Freq: Two times a day (BID) | INTRAVENOUS | Status: DC
  Administered 2022-03-01 – 2022-03-02 (×3): 3 mL via INTRAVENOUS

## 2022-03-01 MED ORDER — HYDROCODONE-ACETAMINOPHEN 7.5-325 MG PO TABS
1.0000 | ORAL_TABLET | Freq: Four times a day (QID) | ORAL | Status: DC | PRN
  Administered 2022-03-01: 1 via ORAL
  Filled 2022-03-01: qty 1

## 2022-03-01 MED ORDER — ACETAMINOPHEN 325 MG PO TABS
325.0000 mg | ORAL_TABLET | Freq: Once | ORAL | Status: DC
  Filled 2022-03-01: qty 1

## 2022-03-01 MED ORDER — ONDANSETRON HCL 4 MG PO TABS: 4.0000 mg | ORAL_TABLET | Freq: Four times a day (QID) | ORAL | Status: DC | PRN

## 2022-03-01 MED ORDER — ENOXAPARIN SODIUM 40 MG/0.4ML IJ SOSY
40.0000 mg | PREFILLED_SYRINGE | INTRAMUSCULAR | Status: DC
  Administered 2022-03-01 – 2022-03-02 (×2): 40 mg via SUBCUTANEOUS
  Filled 2022-03-01 (×2): qty 0.4

## 2022-03-01 MED ORDER — ACETAMINOPHEN 325 MG PO TABS
650.0000 mg | ORAL_TABLET | Freq: Four times a day (QID) | ORAL | Status: DC | PRN
  Administered 2022-03-01 (×2): 650 mg via ORAL
  Filled 2022-03-01: qty 2

## 2022-03-01 MED ORDER — MAGNESIUM SULFATE 2 GM/50ML IV SOLN
2.0000 g | Freq: Once | INTRAVENOUS | Status: AC
  Administered 2022-03-01: 2 g via INTRAVENOUS
  Filled 2022-03-01: qty 50

## 2022-03-01 MED ORDER — SODIUM BICARBONATE 8.4 % IV SOLN
INTRAVENOUS | Status: DC
  Filled 2022-03-01 (×2): qty 1000

## 2022-03-01 MED ORDER — ASPIRIN-ACETAMINOPHEN-CAFFEINE 250-250-65 MG PO TABS
1.0000 | ORAL_TABLET | Freq: Four times a day (QID) | ORAL | Status: DC | PRN
  Filled 2022-03-01: qty 1

## 2022-03-01 MED ORDER — INSULIN ASPART 100 UNIT/ML IJ SOLN: 0.0000 [IU] | Freq: Three times a day (TID) | INTRAMUSCULAR | Status: DC

## 2022-03-01 MED ORDER — ACETAMINOPHEN 650 MG RE SUPP: 650.0000 mg | Freq: Four times a day (QID) | RECTAL | Status: DC | PRN

## 2022-03-01 MED ORDER — SUMATRIPTAN SUCCINATE 6 MG/0.5ML ~~LOC~~ SOLN
6.0000 mg | Freq: Once | SUBCUTANEOUS | Status: AC
  Administered 2022-03-01: 6 mg via SUBCUTANEOUS
  Filled 2022-03-01: qty 0.5

## 2022-03-01 MED ORDER — OXYCODONE HCL 5 MG PO TABS
5.0000 mg | ORAL_TABLET | Freq: Four times a day (QID) | ORAL | Status: AC | PRN
  Administered 2022-03-01 – 2022-03-02 (×3): 5 mg via ORAL
  Filled 2022-03-01 (×3): qty 1

## 2022-03-01 MED ORDER — POTASSIUM CHLORIDE 10 MEQ/100ML IV SOLN
10.0000 meq | INTRAVENOUS | Status: AC
  Filled 2022-03-01: qty 100

## 2022-03-01 MED ORDER — SODIUM ZIRCONIUM CYCLOSILICATE 10 G PO PACK
10.0000 g | PACK | Freq: Once | ORAL | Status: AC
  Administered 2022-03-01: 10 g via ORAL
  Filled 2022-03-01: qty 1

## 2022-03-01 NOTE — Assessment & Plan Note (Signed)
5.3.  Give Lokelma 1 send continue IV fluids.

## 2022-03-01 NOTE — Progress Notes (Addendum)
New Admission Note:  Arrival Method: Stretcher Mental Orientation: Alert and oriented x 4 Telemetry: Box 11 Assessment: Completed Skin: Warm and dry  IV: NSL  Pain: 8/10 abdominal pain Tubes: N/A Safety Measures: Safety Fall Prevention Plan initiated.  Admission: Completed 5 M  Orientation: Patient has been orientated to the room, unit and the staff. Welcome booklet given.  Family: BF  Orders have been reviewed and implemented. Will continue to monitor the patient. Call light has been placed within reach and bed alarm has been activated.   Sima Matas BSN, RN  Phone Number: [phone]

## 2022-03-01 NOTE — Assessment & Plan Note (Signed)
Continue lisinopril

## 2022-03-01 NOTE — Plan of Care (Signed)
  Problem: Education: Goal: Knowledge of General Education information will improve Description: Including pain rating scale, medication(s)/side effects and non-pharmacologic comfort measures Outcome: Completed/Met   

## 2022-03-01 NOTE — Plan of Care (Signed)
  Problem: Education: Goal: Ability to describe self-care measures that may prevent or decrease complications (Diabetes Survival Skills Education) will improve Outcome: Progressing Goal: Individualized Educational Video(s) Outcome: Progressing   Problem: Coping: Goal: Ability to adjust to condition or change in health will improve Outcome: Progressing   Problem: Fluid Volume: Goal: Ability to maintain a balanced intake and output will improve Outcome: Progressing   Problem: Health Behavior/Discharge Planning: Goal: Ability to identify and utilize available resources and services will improve Outcome: Progressing Goal: Ability to manage health-related needs will improve Outcome: Progressing   Problem: Metabolic: Goal: Ability to maintain appropriate glucose levels will improve Outcome: Progressing   Problem: Nutritional: Goal: Maintenance of adequate nutrition will improve Outcome: Progressing Goal: Progress toward achieving an optimal weight will improve Outcome: Progressing   Problem: Skin Integrity: Goal: Risk for impaired skin integrity will decrease Outcome: Progressing   Problem: Tissue Perfusion: Goal: Adequacy of tissue perfusion will improve Outcome: Progressing   Problem: Health Behavior/Discharge Planning: Goal: Ability to manage health-related needs will improve Outcome: Progressing   Problem: Clinical Measurements: Goal: Ability to maintain clinical measurements within normal limits will improve Outcome: Progressing Goal: Will remain free from infection Outcome: Progressing Goal: Diagnostic test results will improve Outcome: Progressing Goal: Respiratory complications will improve Outcome: Progressing Goal: Cardiovascular complication will be avoided Outcome: Progressing   Problem: Activity: Goal: Risk for activity intolerance will decrease Outcome: Progressing   Problem: Nutrition: Goal: Adequate nutrition will be maintained Outcome:  Progressing   Problem: Coping: Goal: Level of anxiety will decrease Outcome: Progressing   Problem: Elimination: Goal: Will not experience complications related to bowel motility Outcome: Progressing Goal: Will not experience complications related to urinary retention Outcome: Progressing   Problem: Pain Managment: Goal: General experience of comfort will improve Outcome: Progressing   Problem: Safety: Goal: Ability to remain free from injury will improve Outcome: Progressing   Problem: Skin Integrity: Goal: Risk for impaired skin integrity will decrease Outcome: Progressing   

## 2022-03-01 NOTE — Assessment & Plan Note (Signed)
Reports adherence to home insulin, Lantus 10 units every morning.  Question of euglycemic DKA as above.  Last A1c was 5.8% in November. -Continue IV fluids -Sliding scale insulin -Check A1c

## 2022-03-01 NOTE — Plan of Care (Signed)
  Problem: Education: Goal: Ability to describe self-care measures that may prevent or decrease complications (Diabetes Survival Skills Education) will improve Outcome: Progressing   

## 2022-03-01 NOTE — Assessment & Plan Note (Addendum)
Suspect alcohol associated ketoacidosis however differential also includes starvation, euglycemic DKA, or lactic acidosis. -Continue IV fluid hydration overnight -Antiemetics as needed -Check serum ethanol, lactic acid, UDS -Repeat BMP in a.m. -Advance diet as tolerated

## 2022-03-01 NOTE — Consult Note (Signed)
Renal Service Consult Note [facility]  Yolanda Flores 03/01/2022 Sol Blazing, MD Requesting Physician: Dr. Marthenia Rolling  Reason for Consult: Ketoacidosis HPI: The patient is a 37 y.o. year-old w/ PMH as below who presented to ED 2/16 w/ c/o SOB, gen'd weakness for 2-3 days. Also reported faituge, body aches, chills, sweats. No appetite for some time. Last etoh drink was 2/16 in am. Hx of etoh abuse. Denied ingestion of any methonal, isopropyl alcohol or ethylene glycol. In ED creart 0.9 and Co2 15, with anion gap of 26. LFT' wnl. BHB was ^'d at 3.6. WBC 12. Bp's good , a bit high. Pt rec'd IV sailne and reglan, benadryl and was admitted. Today her CO2 has dropped down to 11 and the AG is 21. We are asked to see for ketoacidosis.   UA shows ketones of 20. Pt states has not been eating or drinking much due to abdominal pains mostly and no appetite and some abd pain. No diarrhea. She has hx on continued etoh abuse. She c/o SOB which is "very bothersome". No CP, no confusion. Prior admits here were for etoh pancreatitis, etoh withdrawal, pretty much all etoh related admissions.   ROS - denies CP, no joint pain, no HA, no blurry vision, no rash, no dysuria, no difficulty voiding   Past Medical History  Past Medical History:  Diagnosis Date   AKI (acute kidney injury) ([place])    Hypertension    Pancreatitis    Past Surgical History  Past Surgical History:  Procedure Laterality Date   FOOT SURGERY     Family History  Family History  Problem Relation Age of Onset   Hypertension Mother    Diabetes Mother    Hypertension Father    Hypertension Brother    Social History  reports that she quit smoking about 3 years ago. Her smoking use included cigarettes. She smoked an average of 4 packs per day. She has never used smokeless tobacco. She reports that she does not currently use alcohol. She reports that she does not use drugs. Allergies  Allergies  Allergen Reactions    Ibuprofen Nausea And Vomiting   Home medications Prior to Admission medications   Medication Sig Start Date End Date Taking? Authorizing Provider  aspirin-acetaminophen-caffeine (EXCEDRIN MIGRAINE) [phone] MG tablet Take 1-2 tablets by mouth every 6 (six) hours as needed for headache.   Yes [provider]  insulin glargine (LANTUS SOLOSTAR) 100 UNIT/ML Solostar Pen Inject 10 Units into the skin at bedtime. Patient taking differently: Inject 10 Units into the skin daily. 05/13/21  Yes Ladell Pier, MD  lisinopril (ZESTRIL) 5 MG tablet Take 1 tablet (5 mg total) by mouth daily. Patient taking differently: Take 2.5 mg by mouth daily. 12/07/21  Yes Eulogio Bear U, DO  Magnesium 100 MG CAPS Take 1 capsule by mouth daily.   Yes [provider]  Multiple Vitamin (MULTIVITAMIN WITH MINERALS) TABS tablet Take 1 tablet by mouth daily. 01/18/19  Yes Hosie Poisson, MD  omeprazole (PRILOSEC OTC) 20 MG tablet Take 1 tablet (20 mg total) by mouth 2 (two) times daily before a meal. Patient taking differently: Take 20 mg by mouth daily. 01/05/22  Yes Dessa Phi, DO     Vitals:   03/01/22 0610 03/01/22 0736 03/01/22 1256 03/01/22 1555  BP: (!) 128/96 (!) 134/98 (!) 163/89 (!) 159/103  Pulse: (!) 105 100 (!) 105 96  Resp: 18 15 16 17  $ Temp: 98.3 F (36.8 C) 98.5 F (36.9  C) 98.5 F (36.9 C) 98.3 F (36.8 C)  TempSrc: Oral Oral Oral Oral  SpO2: 100% 99% 100% 100%  Weight:      Height:       Exam Gen alert, no distress No rash, cyanosis or gangrene Sclera anicteric, throat clear and a bit dry No jvd or bruits Chest clear bilat to bases, no rales/ wheezing RRR no MRG Abd soft ntnd no mass or ascites +bs GU defer MS no joint effusions or deformity Ext no LE or UE edema, no wounds or ulcers Neuro is alert, Ox 3 , nf, no asterixis or tremors    Home meds include excedrin, lantus, MVI, lisinopril 2.5 qd, prilosec    Na 130  K 5.2  CO2 11  Cl 97  gluc 121, 97, 106    Ca 8.3  AG 22  alb 4.0 yesterday      LFT's okay, BNP 6.5    WBC 8K Hb 11.8     BHB 3.6 , > 8.00 today    Hb A1C 9.1      UA large Hb, ket 20, prot 100, > 50 rbc, 21-50 wbc, 0-5 epi    Etoh 219      Assessment/ Plan: Ketoacidosis - in pt w/ hx of ETOH abuse and type 2 DM on insulin. Blood sugars here have not been high. No hx of ingestion or SI. Suspect alcoholic ketoacidosis as primary concern. Consider diabetic ketoacidosis w/ euglycemia, which would be less likely. Rx for etoh ketoacidosis is IV dextrose and saline IVF's. If not getting better w/ this then would add IV insulin and cont the IV dextrose and saline. Have d/w pmd and w/ RN. Will follow.   ETOH abuse - ongoing, last drink was yesterday am Vol depletion Abd pain - related to #2 H/o etoh pancreatitis       Kelly Splinter  MD [facility] 03/01/2022, 4:52 PM  Recent Labs  Lab 02/28/22 2033 02/28/22 2039 02/28/22 2318 03/01/22 0134 03/01/22 0952 03/01/22 1343  HGB 13.0 13.9  --  11.8*  --   --   ALBUMIN 4.0  --   --   --   --   --   CALCIUM 8.5*  --    < > 7.8* 7.8* 8.3*  CREATININE 0.94  --    < > 0.83 0.80 1.01*  K 4.3 4.5   < > 4.9 4.9 5.2*   < > = values in this interval not displayed.   Inpatient medications:  acetaminophen  325 mg Oral Once   enoxaparin (LOVENOX) injection  40 mg Subcutaneous Q24H   lisinopril  2.5 mg Oral Daily   pantoprazole  40 mg Oral Daily   sodium chloride flush  3 mL Intravenous Q12H    insulin     magnesium sulfate bolus IVPB     sodium bicarbonate 150 mEq in dextrose 5 % 1,150 mL infusion 150 mL/hr at 03/01/22 1406   dextrose, ondansetron **OR** ondansetron (ZOFRAN) IV, mouth rinse, oxyCODONE

## 2022-03-01 NOTE — Hospital Course (Signed)
Yolanda Flores is a 37 y.o. female with medical history significant for insulin-dependent T2DM, HTN, history of alcohol associated pancreatitis who is admitted with nausea, body aches, high anion gap metabolic acidosis.

## 2022-03-01 NOTE — Progress Notes (Signed)
PROGRESS NOTE    Yolanda Flores  [id number] DOB: 10/23/1985 DOA: 02/28/2022 PCP: Ladell Pier, MD  Outpatient Specialists:     Brief Narrative:  Patient is a 37 year old female with history of diabetes mellitus on subcutaneous pursue are correct and units once daily, alcohol abuse (drinks mainly liquid), acute kidney injury, hypertension and pancreatitis.  Patient presented with weakness and bodyaches.  Patient also reported palpitation.  On further questioning, patient informed me that the symptoms have been going on for about a week.  Last alcohol intake was yesterday.  On presentation to the hospital, patient was found to be tachycardic, with heart rate of 162.  Blood pressure was also elevated on presentation.  Patient was also noted to have high anion gap metabolic acidosis.  Anion gap has improved from 24 to 18.  Lactic acid was 4.  Patient is currently on sliding scale insulin coverage.  Last blood sugar was 98.  Blood pressure control is improving.  03/01/2022: Patient seen alongside patient's boyfriend.  Patient reports migraine-like headache.  Patient continues to report feeling groggy.  Apparently, patient was given Benadryl IV emergency room.  No fever or chills.  No GI symptoms.  No urinary symptoms.  No alcohol withdrawal symptoms.   Assessment & Plan:   Principal Problem:   High anion gap metabolic acidosis Active Problems:   Hypertension associated with diabetes (HCC)   Insulin dependent type 2 diabetes mellitus (HCC)   Hyperkalemia   Hypomagnesemia   High anion gap metabolic acidosis -Etiology unclear. -Continue hydration. -Continue to monitor anion gap and lactic acid level. -BMP every 6 hours. -Low threshold for further workup. -Further management depend on hospital course.    Insulin dependent type 2 diabetes mellitus ([place]) -Patient insulin subcutaneous basaglar 10 units once daily.   -Last blood sugar is under 100. -Patient is currently on sliding  scale insulin coverage. -Continue to monitor blood sugar closely. -Continue IV fluids. -Continue to monitor renal function and electrolytes. -A1c is 9.1%.   Hypertension associated with diabetes ([place]) Continue lisinopril. Blood pressure control is improving.   Hyperkalemia -Resolved.   Hypomagnesemia -Continue to monitor and replete.  Migraine headache: -Subcutaneous Imitrex 6 Mg x 1 dose. -Antiplatelet knowledgeably.  Alcohol abuse, with dependence syndrome: -Last drink was last night. -Monitor closely for alcohol withdrawals. -Low threshold to institute CIWA Protocol.  Hepatic steatosis: -Repeat lipid panel. -Acute alcohol -Diet and exercise -Low threshold to repeat right upper quadrant ultrasound.   DVT prophylaxis: Subcutaneous Lovenox. Code Status: Full code Family Communication: Boyfriend was at bedside Disposition Plan: Home in the next 24 to 48 hours.   Consultants:  None  Procedures:  None  Antimicrobials:  None   Subjective: -Continues to feel groggy.  Patient was given Benadryl at the emergency room. -Reports migraine-like headache.  Objective: Vitals:   02/28/22 2300 03/01/22 0215 03/01/22 0610 03/01/22 0736  BP: (!) 129/93 (!) 150/95 (!) 128/96 (!) 134/98  Pulse: (!) 111 (!) 104 (!) 105 100  Resp: (!) 22 18 18 15  $ Temp:   98.3 F (36.8 C) 98.5 F (36.9 C)  TempSrc:   Oral Oral  SpO2: 95% 97% 100% 99%  Weight:  88.8 kg    Height:        Intake/Output Summary (Last 24 hours) at 03/01/2022 0917 Last data filed at 03/01/2022 0800 Gross per 24 hour  Intake 3556.26 ml  Output 550 ml  Net 3006.26 ml   Filed Weights   02/28/22 1930 03/01/22 0215  Weight:  90.3 kg 88.8 kg    Examination:  General exam: Appears calm and comfortable  Respiratory system: Clear to auscultation. Respiratory effort normal. Cardiovascular system: S1 & S2 heard Gastrointestinal system: Abdomen is obese, soft and nontender. Central nervous system: Alert and  oriented.  Patient moves all extremities. Extremities: No leg edema.  Data Reviewed: I have personally reviewed following labs and imaging studies  CBC: Recent Labs  Lab 02/28/22 2033 02/28/22 2039 03/01/22 0134  WBC 12.2*  --  8.3  NEUTROABS 9.5*  --   --   HGB 13.0 13.9 11.8*  HCT 38.0 41.0 34.4*  MCV 83.7  --  84.7  PLT 442*  --  [id number]   Basic Metabolic Panel: Recent Labs  Lab 02/28/22 2033 02/28/22 2039 02/28/22 2318 03/01/22 0134  NA 137 136 136 134*  K 4.3 4.5 5.3* 4.9  CL 97*  --  102 101  CO2 14*  --  16* 15*  GLUCOSE 117*  --  114* 106*  BUN 15  --  12 11  CREATININE 0.94  --  0.79 0.83  CALCIUM 8.5*  --  7.6* 7.8*  MG  --   --  1.5*  --    GFR: Estimated Creatinine Clearance: 109.3 mL/min (by C-G formula based on SCr of 0.83 mg/dL). Liver Function Tests: Recent Labs  Lab 02/28/22 2033  AST 36  ALT 19  ALKPHOS 81  BILITOT 0.6  PROT 8.2*  ALBUMIN 4.0   Recent Labs  Lab 02/28/22 2033  LIPASE 20   No results for input(s): "AMMONIA" in the last 168 hours. Coagulation Profile: No results for input(s): "INR", "PROTIME" in the last 168 hours. Cardiac Enzymes: No results for input(s): "CKTOTAL", "CKMB", "CKMBINDEX", "TROPONINI" in the last 168 hours. BNP (last 3 results) No results for input(s): "PROBNP" in the last 8760 hours. HbA1C: Recent Labs    03/01/22 0134  HGBA1C 9.1*   CBG: Recent Labs  Lab 03/01/22 0300 03/01/22 0712  GLUCAP 110* 98   Lipid Profile: No results for input(s): "CHOL", "HDL", "LDLCALC", "TRIG", "CHOLHDL", "LDLDIRECT" in the last 72 hours. Thyroid Function Tests: Recent Labs    02/28/22 2318  TSH 0.376   Anemia Panel: No results for input(s): "VITAMINB12", "FOLATE", "FERRITIN", "TIBC", "IRON", "RETICCTPCT" in the last 72 hours. Urine analysis:    Component Value Date/Time   COLORURINE YELLOW 03/01/2022 0237   APPEARANCEUR HAZY (A) 03/01/2022 0237   LABSPEC 1.012 03/01/2022 0237   PHURINE 5.0 03/01/2022 0237    GLUCOSEU NEGATIVE 03/01/2022 0237   HGBUR LARGE (A) 03/01/2022 0237   BILIRUBINUR NEGATIVE 03/01/2022 0237   KETONESUR 20 (A) 03/01/2022 0237   PROTEINUR 100 (A) 03/01/2022 0237   UROBILINOGEN 2.0 (H) 12/31/2009 1757   NITRITE NEGATIVE 03/01/2022 0237   LEUKOCYTESUR MODERATE (A) 03/01/2022 0237   Sepsis Labs: @LABRCNTIP$ (procalcitonin:4,lacticidven:4)  ) Recent Results (from the past 240 hour(s))  Resp panel by RT-PCR (RSV, Flu A&B, Covid) Anterior Nasal Swab     Status: None   Collection Time: 02/28/22  8:13 PM   Specimen: Anterior Nasal Swab  Result Value Ref Range Status   SARS Coronavirus 2 by RT PCR NEGATIVE NEGATIVE Final   Influenza A by PCR NEGATIVE NEGATIVE Final   Influenza B by PCR NEGATIVE NEGATIVE Final    Comment: (NOTE) The Xpert Xpress SARS-CoV-2/FLU/RSV plus assay is intended as an aid in the diagnosis of influenza from Nasopharyngeal swab specimens and should not be used as a sole basis for treatment. Nasal washings and  aspirates are unacceptable for Xpert Xpress SARS-CoV-2/FLU/RSV testing.  Fact Sheet for Patients: EntrepreneurPulse.com.au  Fact Sheet for Healthcare Providers: IncredibleEmployment.be  This test is not yet approved or cleared by the [place] FDA and has been authorized for detection and/or diagnosis of SARS-CoV-2 by FDA under an Emergency Use Authorization (EUA). This EUA will remain in effect (meaning this test can be used) for the duration of the COVID-19 declaration under Section 564(b)(1) of the Act, 21 U.S.C. section 360bbb-3(b)(1), unless the authorization is terminated or revoked.     Resp Syncytial Virus by PCR NEGATIVE NEGATIVE Final    Comment: (NOTE) Fact Sheet for Patients: EntrepreneurPulse.com.au  Fact Sheet for Healthcare Providers: IncredibleEmployment.be  This test is not yet approved or cleared by the [place] FDA and has been  authorized for detection and/or diagnosis of SARS-CoV-2 by FDA under an Emergency Use Authorization (EUA). This EUA will remain in effect (meaning this test can be used) for the duration of the COVID-19 declaration under Section 564(b)(1) of the Act, 21 U.S.C. section 360bbb-3(b)(1), unless the authorization is terminated or revoked.  Performed at [facility], [place] [address] [place], [place] [address]          Radiology Studies: DG Chest 2 View  Result Date: 02/28/2022 CLINICAL DATA:  Shortness of breath. EXAM: CHEST - 2 VIEW COMPARISON:  Chest radiograph dated 01/03/2022. FINDINGS: The heart size and mediastinal contours are within normal limits. Both lungs are clear. The visualized skeletal structures are unremarkable. IMPRESSION: No active cardiopulmonary disease. Electronically Signed   By: Anner Crete M.D.   On: 02/28/2022 21:08        Scheduled Meds:  acetaminophen  325 mg Oral Once   enoxaparin (LOVENOX) injection  40 mg Subcutaneous Q24H   insulin aspart  0-9 Units Subcutaneous TID WC   lisinopril  2.5 mg Oral Daily   pantoprazole  40 mg Oral Daily   sodium chloride flush  3 mL Intravenous Q12H   SUMAtriptan  6 mg Subcutaneous Once   Continuous Infusions:  sodium chloride 150 mL/hr at 03/01/22 0415     LOS: 0 days    Time spent: 55 minutes.    Dana Allan, MD  [facility] Pager #: [phone] 7PM-7AM contact night coverage as above

## 2022-03-01 NOTE — Progress Notes (Signed)
New orders for pt. Obtained, Rapid Response called to assist with insulin gtt, Rapid Response in to see pt.  Yolanda Flores

## 2022-03-01 NOTE — Progress Notes (Signed)
MD made aware of BMP and Lactic Acid results  Anastasio Auerbach

## 2022-03-01 NOTE — Assessment & Plan Note (Signed)
IV supplement ordered.

## 2022-03-01 NOTE — ED Notes (Signed)
ED TO INPATIENT HANDOFF REPORT  ED Nurse Name and Phone #: Lysle Rubens RN [phone]  S Name/Age/Gender Yolanda Flores 37 y.o. female Room/Bed: 009C/009C  Code Status   Code Status: Full Code  Home/SNF/Other Home Patient oriented to: self, place, time, and situation Is this baseline? Yes   Triage Complete: Triage complete  Chief Complaint High anion gap metabolic acidosis [id number]  Triage Note PER EMS: pt reports weakness, shortness of breath x 3 days. Ems arrived to find her HR 170. Pt denies any pain. Denies additional complaints.   BP 190/100. 500 cc NS. Pts HR decreased to 134. 02-97% RA. CBG-95.     Allergies Allergies  Allergen Reactions   Ibuprofen Nausea And Vomiting    Level of Care/Admitting Diagnosis ED Disposition     ED Disposition  Admit   Condition  --   [place]: [place] [[id number]]  Level of Care: Telemetry Medical [104]  May place patient in observation at [facility] or [place] if equivalent level of care is available:: No  Covid Evaluation: Confirmed COVID Negative  Diagnosis: High anion gap metabolic acidosis [id number]  Admitting Physician: Lenore Cordia [id number]  Attending Physician: Lenore Cordia [id number]          B Medical/Surgery History Past Medical History:  Diagnosis Date   AKI (acute kidney injury) ([place])    Hypertension    Pancreatitis    Past Surgical History:  Procedure Laterality Date   FOOT SURGERY       A IV Location/Drains/Wounds Patient Lines/Drains/Airways Status     Active Line/Drains/Airways     Name Placement date Placement time Site Days   Peripheral IV 02/28/22 20 G Left Antecubital 02/28/22  2029  Antecubital  1            Intake/Output Last 24 hours No intake or output data in the 24 hours ending 03/01/22 0111  Labs/Imaging Results for orders placed or performed during the hospital encounter of 02/28/22 (from the past 48 hour(s))  Resp panel by RT-PCR  (RSV, Flu A&B, Covid) Anterior Nasal Swab     Status: None   Collection Time: 02/28/22  8:13 PM   Specimen: Anterior Nasal Swab  Result Value Ref Range   SARS Coronavirus 2 by RT PCR NEGATIVE NEGATIVE   Influenza A by PCR NEGATIVE NEGATIVE   Influenza B by PCR NEGATIVE NEGATIVE    Comment: (NOTE) The Xpert Xpress SARS-CoV-2/FLU/RSV plus assay is intended as an aid in the diagnosis of influenza from Nasopharyngeal swab specimens and should not be used as a sole basis for treatment. Nasal washings and aspirates are unacceptable for Xpert Xpress SARS-CoV-2/FLU/RSV testing.  Fact Sheet for Patients: EntrepreneurPulse.com.au  Fact Sheet for Healthcare Providers: IncredibleEmployment.be  This test is not yet approved or cleared by the [place] FDA and has been authorized for detection and/or diagnosis of SARS-CoV-2 by FDA under an Emergency Use Authorization (EUA). This EUA will remain in effect (meaning this test can be used) for the duration of the COVID-19 declaration under Section 564(b)(1) of the Act, 21 U.S.C. section 360bbb-3(b)(1), unless the authorization is terminated or revoked.     Resp Syncytial Virus by PCR NEGATIVE NEGATIVE    Comment: (NOTE) Fact Sheet for Patients: EntrepreneurPulse.com.au  Fact Sheet for Healthcare Providers: IncredibleEmployment.be  This test is not yet approved or cleared by the [place] FDA and has been authorized for detection and/or diagnosis of SARS-CoV-2 by FDA under an Emergency Use Authorization (  EUA). This EUA will remain in effect (meaning this test can be used) for the duration of the COVID-19 declaration under Section 564(b)(1) of the Act, 21 U.S.C. section 360bbb-3(b)(1), unless the authorization is terminated or revoked.  Performed at [facility], [place] [address], [place], [place] [address]   CBC with Differential     Status: Abnormal    Collection Time: 02/28/22  8:33 PM  Result Value Ref Range   WBC 12.2 (H) 4.0 - 10.5 K/uL   RBC 4.54 3.87 - 5.11 MIL/uL   Hemoglobin 13.0 12.0 - 15.0 g/dL   HCT 38.0 36.0 - 46.0 %   MCV 83.7 80.0 - 100.0 fL   MCH 28.6 26.0 - 34.0 pg   MCHC 34.2 30.0 - 36.0 g/dL   RDW 14.0 11.5 - 15.5 %   Platelets 442 (H) 150 - 400 K/uL   nRBC 0.0 0.0 - 0.2 %   Neutrophils Relative % 77 %   Neutro Abs 9.5 (H) 1.7 - 7.7 K/uL   Lymphocytes Relative 16 %   Lymphs Abs 1.9 0.7 - 4.0 K/uL   Monocytes Relative 5 %   Monocytes Absolute 0.6 0.1 - 1.0 K/uL   Eosinophils Relative 1 %   Eosinophils Absolute 0.1 0.0 - 0.5 K/uL   Basophils Relative 1 %   Basophils Absolute 0.1 0.0 - 0.1 K/uL   Immature Granulocytes 0 %   Abs Immature Granulocytes 0.04 0.00 - 0.07 K/uL    Comment: Performed at [facility] [address] [place], [place] [address]  Brain natriuretic peptide     Status: None   Collection Time: 02/28/22  8:33 PM  Result Value Ref Range   B Natriuretic Peptide 6.5 0.0 - 100.0 pg/mL    Comment: Performed at [facility] [address], [place], [place] [address]  Lipase, blood     Status: None   Collection Time: 02/28/22  8:33 PM  Result Value Ref Range   Lipase 20 11 - 51 U/L    Comment: Performed at [facility], [place] [address], [place], [place] [address]  Comprehensive metabolic panel     Status: Abnormal   Collection Time: 02/28/22  8:33 PM  Result Value Ref Range   Sodium 137 135 - 145 mmol/L   Potassium 4.3 3.5 - 5.1 mmol/L   Chloride 97 (L) 98 - 111 mmol/L   CO2 14 (L) 22 - 32 mmol/L   Glucose, Bld 117 (H) 70 - 99 mg/dL    Comment: Glucose reference range applies only to samples taken after fasting for at least 8 hours.   BUN 15 6 - 20 mg/dL   Creatinine, Ser 0.94 0.44 - 1.00 mg/dL   Calcium 8.5 (L) 8.9 - 10.3 mg/dL   Total Protein 8.2 (H) 6.5 - 8.1 g/dL   Albumin 4.0 3.5 - 5.0 g/dL   AST 36 15 - 41 U/L   ALT 19 0 - 44 U/L   Alkaline Phosphatase 81 38 - 126  U/L   Total Bilirubin 0.6 0.3 - 1.2 mg/dL   GFR, Estimated >60 >60 mL/min    Comment: (NOTE) Calculated using the CKD-EPI Creatinine Equation (2021)    Anion gap 26 (H) 5 - 15    Comment: ELECTROLYTES REPEATED TO VERIFY Performed at [facility] [address], [place], [place] [address]   Beta-hydroxybutyric acid     Status: Abnormal   Collection Time: 02/28/22  8:33 PM  Result  Value Ref Range   Beta-Hydroxybutyric Acid 3.63 (H) 0.05 - 0.27 mmol/L    Comment: Performed at [facility] [address] [place], [place] [address]  I-Stat Beta hCG blood, ED ([facility], [facility], [facility] only)     Status: None   Collection Time: 02/28/22  8:38 PM  Result Value Ref Range   I-stat hCG, quantitative <5.0 <5 mIU/mL   Comment 3            Comment:   GEST. AGE      CONC.  (mIU/mL)   <=1 WEEK        5 - 50     2 WEEKS       50 - 500     3 WEEKS       100 - 10,000     4 WEEKS     1,000 - 30,000        FEMALE AND NON-PREGNANT FEMALE:     LESS THAN 5 mIU/mL   I-Stat venous blood gas, ([facility] ED, [facility], [facility])     Status: Abnormal   Collection Time: 02/28/22  8:39 PM  Result Value Ref Range   pH, Ven 7.286 7.25 - 7.43   pCO2, Ven 34.7 (L) 44 - 60 mmHg   pO2, Ven 67 (H) 32 - 45 mmHg   Bicarbonate 16.5 (L) 20.0 - 28.0 mmol/L   TCO2 18 (L) 22 - 32 mmol/L   O2 Saturation 91 %   Acid-base deficit 9.0 (H) 0.0 - 2.0 mmol/L   Sodium 136 135 - 145 mmol/L   Potassium 4.5 3.5 - 5.1 mmol/L   Calcium, Ion 0.98 (L) 1.15 - 1.40 mmol/L   HCT 41.0 36.0 - 46.0 %   Hemoglobin 13.9 12.0 - 15.0 g/dL   Sample type VENOUS   Basic metabolic panel     Status: Abnormal   Collection Time: 02/28/22 11:18 PM  Result Value Ref Range   Sodium 136 135 - 145 mmol/L   Potassium 5.3 (H) 3.5 - 5.1 mmol/L   Chloride 102 98 - 111 mmol/L   CO2 16 (L) 22 - 32 mmol/L   Glucose, Bld 114 (H) 70 - 99 mg/dL    Comment: Glucose reference range applies only to samples taken after fasting for at least 8 hours.   BUN 12 6 - 20 mg/dL    Creatinine, Ser 0.79 0.44 - 1.00 mg/dL   Calcium 7.6 (L) 8.9 - 10.3 mg/dL   GFR, Estimated >60 >60 mL/min    Comment: (NOTE) Calculated using the CKD-EPI Creatinine Equation (2021)    Anion gap 18 (H) 5 - 15    Comment: Performed at [facility] [address], [place], [place] [address]  Magnesium     Status: Abnormal   Collection Time: 02/28/22 11:18 PM  Result Value Ref Range   Magnesium 1.5 (L) 1.7 - 2.4 mg/dL    Comment: Performed at [facility] [address] [place], [place] [address]   DG Chest 2 View  Result Date: 02/28/2022 CLINICAL DATA:  Shortness of breath. EXAM: CHEST - 2 VIEW COMPARISON:  Chest radiograph dated 01/03/2022. FINDINGS: The heart size and mediastinal contours are within normal limits. Both lungs are clear. The visualized skeletal structures are unremarkable. IMPRESSION: No active cardiopulmonary disease. Electronically Signed   By: Anner Crete M.D.   On: 02/28/2022 21:08    Pending Labs Unresulted Labs (From admission, onward)     Start  Ordered   03/01/22 0500  CBC  Tomorrow morning,   R        03/01/22 0035   03/01/22 [id number]  Basic metabolic panel  Tomorrow morning,   R        03/01/22 0035   03/01/22 0500  Hemoglobin A1c  Tomorrow morning,   R        03/01/22 0051   03/01/22 0022  Ethanol  Once,   R        03/01/22 0021   03/01/22 0022  TSH  Once,   R        03/01/22 0021   03/01/22 0022  Lactic acid, plasma  Once,   R        03/01/22 0021   03/01/22 0022  Rapid urine drug screen (hospital performed)  Once,   R        03/01/22 0021   02/28/22 2014  Urinalysis, Routine w reflex microscopic -Urine, Clean Catch  Once,   URGENT       Question:  Specimen Source  Answer:  Urine, Clean Catch   02/28/22 2013            Vitals/Pain Today's Vitals   02/28/22 2015 02/28/22 2030 02/28/22 2100 02/28/22 2300  BP:   118/81 (!) 129/93  Pulse: (!) 146 (!) 139 (!) 127 (!) 111  Resp: (!) 33 (!) 29 (!) 25 (!) 22  Temp:       TempSrc:      SpO2: 95% 93% 94% 95%  Weight:      Height:      PainSc:        Isolation Precautions No active isolations  Medications Medications  enoxaparin (LOVENOX) injection 40 mg (has no administration in time range)  sodium chloride flush (NS) 0.9 % injection 3 mL (has no administration in time range)  0.9 %  sodium chloride infusion (has no administration in time range)  acetaminophen (TYLENOL) tablet 650 mg (has no administration in time range)    Or  acetaminophen (TYLENOL) suppository 650 mg (has no administration in time range)  ondansetron (ZOFRAN) tablet 4 mg (has no administration in time range)    Or  ondansetron (ZOFRAN) injection 4 mg (has no administration in time range)  sodium zirconium cyclosilicate (LOKELMA) packet 10 g (has no administration in time range)  lisinopril (ZESTRIL) tablet 2.5 mg (has no administration in time range)  pantoprazole (PROTONIX) EC tablet 40 mg (has no administration in time range)  insulin aspart (novoLOG) injection 0-9 Units (has no administration in time range)  magnesium sulfate IVPB 2 g 50 mL (has no administration in time range)  sodium chloride 0.9 % bolus 1,000 mL (0 mLs Intravenous Stopped 02/28/22 2157)  sodium chloride 0.9 % bolus 1,000 mL (1,000 mLs Intravenous New Bag/Given 02/28/22 2157)  metoCLOPramide (REGLAN) injection 10 mg (10 mg Intravenous Given 02/28/22 2346)  acetaminophen (TYLENOL) tablet 1,000 mg (1,000 mg Oral Given 02/28/22 2346)  diphenhydrAMINE (BENADRYL) injection 25 mg (25 mg Intravenous Given 02/28/22 2345)    Mobility walks     Focused Assessments Cardiac Assessment Handoff:    Lab Results  Component Value Date   CKTOTAL 44 12/04/2021   TROPONINI <0.03 05/25/2018   Lab Results  Component Value Date   DDIMER 2.12 (H) 05/24/2018   Does the Patient currently have chest pain? No   , Neuro Assessment Handoff:  Swallow screen pass?          Neuro Assessment:   Neuro  Checks:      Has  TPA been given?  If patient is a Neuro Trauma and patient is going to OR before floor call report to [facility] nurse: [phone] or [phone]  , Renal Assessment Handoff:  Hemodialysis Schedule:  Last Hemodialysis date and time:    Restricted appendage:    , Pulmonary Assessment Handoff:  Lung sounds:   O2 Device: Room Air      R Recommendations: See Admitting Provider Note  Report given to:   Additional Notes:

## 2022-03-01 NOTE — Progress Notes (Signed)
Report given to [employer], RN, pt. Transferred to 4E via bed in stable condition

## 2022-03-01 NOTE — H&P (Signed)
History and Physical    Yolanda Flores [id number] DOB: [birth year] DOA: 02/28/2022  PCP: Ladell Pier, MD  Patient coming from: Home  I have personally briefly reviewed patient's old medical records in [place]  Chief Complaint: Weakness, body aches  HPI: Yolanda Flores is a 37 y.o. female with medical history significant for insulin-dependent T2DM, HTN, history of alcohol associated pancreatitis who presents to the ED for evaluation of weakness and bodyaches.  Patient reports approximately 1 week of fatigue, generalized weakness, body aches, chills, diaphoresis, and exertional dyspnea.  She has had no appetite and has not really eaten much during this time.  She has had sensation of her heart racing.  She denies chest pain, nausea, vomiting, abdominal pain, dysuria.  She does report drinking about 3 shots of liquor daily which she has continued during this time and last drink morning of 2/16.  Denies history of alcohol withdrawal.  Denies cocaine use.  Denies ingestion of methanol, isopropyl alcohol, or ethylene glycol ingestion.  ED Course  Labs/Imaging on admission: I have personally reviewed following labs and imaging studies.  Initial vitals showed BP 139/101, pulse 162, RR 17, temp 98.8 F, SpO2 96% on room air.  Labs show WBC 12.2, hemoglobin 13.0, platelets 442,000, lipase 20, BNP 6.5, sodium 137, potassium 4.3, bicarb 14, BUN 15, creatinine 0.94, serum glucose 117, anion gap 26, LFTs within normal limits, beta hydroxybutyrate 3.63.  I-STAT beta-hCG <5.0.  COVID, influenza, RSV negative.  2 view chest x-ray negative for focal consolidation, edema, effusion.  Patient was given 2 L normal saline, IV Reglan 10 mg, IV Benadryl.  Repeat BMP notable for potassium 5.3, bicarb 16, serum glucose 114, anion gap 18.  The hospitalist service was consulted to admit for further evaluation and management.  Review of Systems: All systems reviewed and are negative except as  documented in history of present illness above.   Past Medical History:  Diagnosis Date   AKI (acute kidney injury) ([place])    Hypertension    Pancreatitis     Past Surgical History:  Procedure Laterality Date   FOOT SURGERY      Social History:  reports that she quit smoking about 3 years ago. Her smoking use included cigarettes. She smoked an average of 4 packs per day. She has never used smokeless tobacco. She reports that she does not currently use alcohol. She reports that she does not use drugs.  Allergies  Allergen Reactions   Ibuprofen Nausea And Vomiting    Family History  Problem Relation Age of Onset   Hypertension Mother    Diabetes Mother    Hypertension Father    Hypertension Brother      Prior to Admission medications   Medication Sig Start Date End Date Taking? Authorizing Provider  aspirin-acetaminophen-caffeine (EXCEDRIN MIGRAINE) [phone] MG tablet Take 1-2 tablets by mouth every 6 (six) hours as needed for headache.   Yes [provider]  insulin glargine (LANTUS SOLOSTAR) 100 UNIT/ML Solostar Pen Inject 10 Units into the skin at bedtime. Patient taking differently: Inject 10 Units into the skin daily. 05/13/21  Yes Ladell Pier, MD  lisinopril (ZESTRIL) 5 MG tablet Take 1 tablet (5 mg total) by mouth daily. Patient taking differently: Take 2.5 mg by mouth daily. 12/07/21  Yes Eulogio Bear U, DO  Magnesium 100 MG CAPS Take 1 capsule by mouth daily.   Yes [provider]  Multiple Vitamin (MULTIVITAMIN WITH MINERALS) TABS tablet Take 1 tablet by  mouth daily. 01/18/19  Yes Hosie Poisson, MD  omeprazole (PRILOSEC OTC) 20 MG tablet Take 1 tablet (20 mg total) by mouth 2 (two) times daily before a meal. Patient taking differently: Take 20 mg by mouth daily. 01/05/22  Yes Dessa Phi, DO    Physical Exam: Vitals:   02/28/22 2015 02/28/22 2030 02/28/22 2100 02/28/22 2300  BP:   118/81 (!) 129/93  Pulse: (!) 146 (!) 139 (!) 127 (!)  111  Resp: (!) 33 (!) 29 (!) 25 (!) 22  Temp:      TempSrc:      SpO2: 95% 93% 94% 95%  Weight:      Height:       Constitutional: Resting in bed, NAD, calm, comfortable Eyes: EOMI, lids and conjunctivae normal ENMT: Mucous membranes are dry. Posterior pharynx clear of any exudate or lesions.poor dentition.  Neck: normal, supple, no masses. Respiratory: clear to auscultation bilaterally, no wheezing, no crackles. Normal respiratory effort. No accessory muscle use.  Cardiovascular: Regular rate and rhythm, no murmurs / rubs / gallops. No extremity edema. 2+ pedal pulses. Abdomen: no tenderness, no masses palpated.  Musculoskeletal: no clubbing / cyanosis. No joint deformity upper and lower extremities. Good ROM, no contractures. Normal muscle tone.  Skin: no rashes, lesions, ulcers. No induration Neurologic: Sensation intact. Strength 5/5 in all 4.  Psychiatric:  Alert and oriented x 3. Normal mood.   EKG: Personally reviewed. Sinus tachycardia, rate 153.  Rate is faster when compared to prior.  Assessment/Plan Principal Problem:   High anion gap metabolic acidosis Active Problems:   Insulin dependent type 2 diabetes mellitus ([place])   Hypertension associated with diabetes ([place])   Hyperkalemia   Hypomagnesemia   Yolanda Flores is a 37 y.o. female with medical history significant for insulin-dependent T2DM, HTN, history of alcohol associated pancreatitis who is admitted with nausea, body aches, high anion gap metabolic acidosis.  Assessment and Plan: * High anion gap metabolic acidosis Suspect alcohol associated ketoacidosis however differential also includes starvation, euglycemic DKA, or lactic acidosis. -Continue IV fluid hydration overnight -Antiemetics as needed -Check serum ethanol, lactic acid, UDS -Repeat BMP in a.m. -Advance diet as tolerated  Insulin dependent type 2 diabetes mellitus (HCC) Reports adherence to home insulin, Lantus 10 units every morning.  Question  of euglycemic DKA as above.  Last A1c was 5.8% in November. -Continue IV fluids -Sliding scale insulin -Check A1c  Hypertension associated with diabetes (HCC) Continue lisinopril.  Hyperkalemia 5.3.  Give Lokelma 1 send continue IV fluids.  Hypomagnesemia IV supplement ordered.  DVT prophylaxis: enoxaparin (LOVENOX) injection 40 mg Start: 03/01/22 1000 Code Status: Full code Family Communication: Significant other at bedside Disposition Plan: From home and likely discharge to home pending clinical progress Consults called: None Severity of Illness: The appropriate patient status for this patient is OBSERVATION. Observation status is judged to be reasonable and necessary in order to provide the required intensity of service to ensure the patient's safety. The patient's presenting symptoms, physical exam findings, and initial radiographic and laboratory data in the context of their medical condition is felt to place them at decreased risk for further clinical deterioration. Furthermore, it is anticipated that the patient will be medically stable for discharge from the hospital within 2 midnights of admission.   Zada Finders MD [facility]  If 7PM-7AM, please contact night-coverage [email or web address]  03/01/2022, 12:55 AM

## 2022-03-01 NOTE — Progress Notes (Signed)
Latest Reference Range & Units 03/01/22 01:34  Lactic Acid, Venous 0.5 - 1.9 mmol/L 4.0 (HH)  (HH): Data is critically high MD on call made aware and new order received.

## 2022-03-02 DIAGNOSIS — E8729 Other acidosis: Secondary | ICD-10-CM | POA: Diagnosis not present

## 2022-03-02 LAB — ALBUMIN: Albumin: 3.3 g/dL — ABNORMAL LOW (ref 3.5–5.0)

## 2022-03-02 LAB — GLUCOSE, CAPILLARY
Glucose-Capillary: 132 mg/dL — ABNORMAL HIGH (ref 70–99)
Glucose-Capillary: 141 mg/dL — ABNORMAL HIGH (ref 70–99)
Glucose-Capillary: 159 mg/dL — ABNORMAL HIGH (ref 70–99)
Glucose-Capillary: 169 mg/dL — ABNORMAL HIGH (ref 70–99)
Glucose-Capillary: 170 mg/dL — ABNORMAL HIGH (ref 70–99)
Glucose-Capillary: 176 mg/dL — ABNORMAL HIGH (ref 70–99)
Glucose-Capillary: 187 mg/dL — ABNORMAL HIGH (ref 70–99)
Glucose-Capillary: 190 mg/dL — ABNORMAL HIGH (ref 70–99)
Glucose-Capillary: 193 mg/dL — ABNORMAL HIGH (ref 70–99)
Glucose-Capillary: 201 mg/dL — ABNORMAL HIGH (ref 70–99)
Glucose-Capillary: 201 mg/dL — ABNORMAL HIGH (ref 70–99)
Glucose-Capillary: 209 mg/dL — ABNORMAL HIGH (ref 70–99)
Glucose-Capillary: 223 mg/dL — ABNORMAL HIGH (ref 70–99)
Glucose-Capillary: 228 mg/dL — ABNORMAL HIGH (ref 70–99)

## 2022-03-02 LAB — BASIC METABOLIC PANEL
Anion gap: 16 — ABNORMAL HIGH (ref 5–15)
Anion gap: 15 (ref 5–15)
Anion gap: 13 (ref 5–15)
BUN: 5 mg/dL — ABNORMAL LOW (ref 6–20)
BUN: 5 mg/dL — ABNORMAL LOW (ref 6–20)
BUN: <5 mg/dL — ABNORMAL LOW (ref 6–20)
CO2: 21 mmol/L — ABNORMAL LOW (ref 22–32)
CO2: 23 mmol/L (ref 22–32)
CO2: 25 mmol/L (ref 22–32)
Calcium: 8.3 mg/dL — ABNORMAL LOW (ref 8.9–10.3)
Calcium: 8.2 mg/dL — ABNORMAL LOW (ref 8.9–10.3)
Calcium: 8.5 mg/dL — ABNORMAL LOW (ref 8.9–10.3)
Chloride: 92 mmol/L — ABNORMAL LOW (ref 98–111)
Chloride: 90 mmol/L — ABNORMAL LOW (ref 98–111)
Chloride: 91 mmol/L — ABNORMAL LOW (ref 98–111)
Creatinine, Ser: 0.89 mg/dL (ref 0.44–1.00)
Creatinine, Ser: 0.89 mg/dL (ref 0.44–1.00)
Creatinine, Ser: 0.84 mg/dL (ref 0.44–1.00)
GFR, Estimated: >60 mL/min (ref >60)
GFR, Estimated: >60 mL/min (ref >60)
GFR, Estimated: >60 mL/min (ref >60)
Glucose, Bld: 172 mg/dL — ABNORMAL HIGH (ref 70–99)
Glucose, Bld: 155 mg/dL — ABNORMAL HIGH (ref 70–99)
Glucose, Bld: 214 mg/dL — ABNORMAL HIGH (ref 70–99)
Potassium: 4.1 mmol/L (ref 3.5–5.1)
Potassium: 3.8 mmol/L (ref 3.5–5.1)
Potassium: 3.8 mmol/L (ref 3.5–5.1)
Sodium: 129 mmol/L — ABNORMAL LOW (ref 135–145)
Sodium: 128 mmol/L — ABNORMAL LOW (ref 135–145)
Sodium: 129 mmol/L — ABNORMAL LOW (ref 135–145)

## 2022-03-02 LAB — MAGNESIUM: Magnesium: 1.9 mg/dL (ref 1.7–2.4)

## 2022-03-02 LAB — PROTEIN, TOTAL: Total Protein: 7 g/dL (ref 6.5–8.1)

## 2022-03-02 LAB — BETA-HYDROXYBUTYRIC ACID: Beta-Hydroxybutyric Acid: 3.25 mmol/L — ABNORMAL HIGH (ref 0.05–0.27)

## 2022-03-02 LAB — PHOSPHORUS: Phosphorus: 1.7 mg/dL — ABNORMAL LOW (ref 2.5–4.6)

## 2022-03-02 LAB — PREALBUMIN: Prealbumin: 25 mg/dL (ref 18–38)

## 2022-03-02 MED ORDER — ADULT MULTIVITAMIN W/MINERALS CH
1.0000 | ORAL_TABLET | Freq: Every day | ORAL | Status: DC
  Administered 2022-03-02 – 2022-03-03 (×2): 1 via ORAL
  Filled 2022-03-02 (×2): qty 1

## 2022-03-02 MED ORDER — PROSIGHT PO TABS: 1.0000 | ORAL_TABLET | Freq: Every day | ORAL | Status: DC

## 2022-03-02 MED ORDER — THIAMINE HCL 100 MG/ML IJ SOLN: 100.0000 mg | Freq: Every day | INTRAMUSCULAR | Status: DC

## 2022-03-02 MED ORDER — FOLIC ACID 1 MG PO TABS
1.0000 mg | ORAL_TABLET | Freq: Every day | ORAL | Status: DC
  Administered 2022-03-02 – 2022-03-03 (×2): 1 mg via ORAL
  Filled 2022-03-02 (×2): qty 1

## 2022-03-02 MED ORDER — THIAMINE MONONITRATE 100 MG PO TABS
100.0000 mg | ORAL_TABLET | Freq: Every day | ORAL | Status: DC
  Administered 2022-03-03: 100 mg via ORAL
  Filled 2022-03-02: qty 1

## 2022-03-02 MED ORDER — OXYCODONE HCL 5 MG PO TABS
5.0000 mg | ORAL_TABLET | Freq: Once | ORAL | Status: AC
  Administered 2022-03-02: 5 mg via ORAL
  Filled 2022-03-02: qty 1

## 2022-03-02 MED ORDER — INSULIN ASPART 100 UNIT/ML IJ SOLN
0.0000 [IU] | Freq: Three times a day (TID) | INTRAMUSCULAR | Status: DC
  Administered 2022-03-02: 2 [IU] via SUBCUTANEOUS

## 2022-03-02 MED ORDER — LORAZEPAM 1 MG PO TABS: 1.0000 mg | ORAL_TABLET | ORAL | Status: DC | PRN

## 2022-03-02 MED ORDER — ACETAMINOPHEN 500 MG PO TABS: 1000.0000 mg | ORAL_TABLET | Freq: Once | ORAL | Status: DC | PRN

## 2022-03-02 MED ORDER — FOLIC ACID 1 MG PO TABS: 1.0000 mg | ORAL_TABLET | Freq: Every day | ORAL | Status: DC

## 2022-03-02 MED ORDER — ATORVASTATIN CALCIUM 40 MG PO TABS
40.0000 mg | ORAL_TABLET | Freq: Every day | ORAL | Status: DC
  Administered 2022-03-02 – 2022-03-03 (×2): 40 mg via ORAL
  Filled 2022-03-02 (×2): qty 1

## 2022-03-02 MED ORDER — FENOFIBRATE 160 MG PO TABS
160.0000 mg | ORAL_TABLET | Freq: Every day | ORAL | Status: DC
  Administered 2022-03-02 – 2022-03-03 (×2): 160 mg via ORAL
  Filled 2022-03-02 (×2): qty 1

## 2022-03-02 MED ORDER — LORAZEPAM 0.5 MG PO TABS: 0.5000 mg | ORAL_TABLET | Freq: Four times a day (QID) | ORAL | Status: DC | PRN

## 2022-03-02 MED ORDER — THIAMINE HCL 100 MG/ML IJ SOLN
500.0000 mg | Freq: Once | INTRAVENOUS | Status: AC
  Administered 2022-03-02: 500 mg via INTRAVENOUS
  Filled 2022-03-02: qty 5

## 2022-03-02 MED ORDER — POTASSIUM & SODIUM PHOSPHATES 280-160-250 MG PO PACK
1.0000 | PACK | Freq: Three times a day (TID) | ORAL | Status: DC
  Administered 2022-03-02 – 2022-03-03 (×3): 1 via ORAL
  Filled 2022-03-02 (×4): qty 1

## 2022-03-02 NOTE — Progress Notes (Signed)
[facility] Progress Note  Subjective: seen in room. AG closed and acidemia resolved.   Vitals:   03/01/22 2300 03/02/22 0425 03/02/22 0813 03/02/22 1220  BP:   (!) 132/98 (!) 134/98  Pulse:   83   Resp:   16 20  Temp: 97.9 F (36.6 C) 98.3 F (36.8 C) 98.7 F (37.1 C) 98.2 F (36.8 C)  TempSrc: Oral Oral Oral Oral  SpO2:   99%   Weight:      Height:        Exam: Gen alert, no distress Good skin turgor, tongue is moist No jvd or bruits Chest clear bilat to bases RRR no MRG Abd soft ntnd no mass or ascites +bs Ext no LE or UE edema Neuro is alert, Ox 3 , nf      Home meds include -- > excedrin, lantus, MVI, lisinopril 2.5 qd, prilosec      LFT's okay, BNP 6.5    WBC 8K Hb 11.8     BHB 3.6 , > 8.00 today    Hb A1C 9.1      UA large Hb, ket 20, prot 100, > 50 rbc, 21-50 wbc, 0-5 epi    Etoh 219         Assessment/ Plan: Ketoacidosis - in pt w/ hx of ETOH abuse and type 2 DM on insulin. Blood sugars were not elevated, some were low. No hx of ingestion or SI. Suspected alcoholic ketoacidosis which is common w/ binge drinkers. Treatment is IV dextrose + vol replacement w/ saline IVF"s. She rec'd both yesterday (w/ D5 w/ bicarb in one infusion) and the acidosis resolved and the AG closed. Did not require any IV insulin. BS's improved and are stable in the 100s to low 200s. Would wean off the IV dextrose when pt is eating well and is able to keep her BS up w/o help (etoh ketoacidosis and starvation ketoacidosis are very similar). Vol depletion mostly resolved, defer to pmd whether to cont saline IVF's or not. No further suggestions , will sign off.  ETOH abuse - ongoing issue Vol depletion - 4 L + since admission, mostly vol repleted, HR down. Will defer to pmd about continuing IVF"s vs po intake from here.  Abd pain - related to #2, usually esophagitis vs gastritis vs pancreatitis vs hepatitis H/o etoh pancreatitis      Kelly Splinter MD [facility] 03/02/2022, 2:13  PM  Recent Labs  Lab 02/28/22 2033 02/28/22 2039 02/28/22 2318 03/01/22 0134 03/01/22 [id number] 03/01/22 1657 03/01/22 2124 03/02/22 0415 03/02/22 1025 03/02/22 1242  HGB 13.0 13.9  --  11.8*  --   --   --   --   --   --   ALBUMIN 4.0  --   --   --   --   --   --   --  3.3*  --   CALCIUM 8.5*  --    < > 7.8*   < > 8.0*   < > 8.2*  --  8.5*  PHOS  --   --   --   --   --  2.2*  --   --  1.7*  --   CREATININE 0.94  --    < > 0.83   < > 1.10*   < > 0.89  --  0.84  K 4.3 4.5   < > 4.9   < > 5.0   < > 3.8  --  3.8   < > =  values in this interval not displayed.   No results for input(s): "IRON", "TIBC", "FERRITIN" in the last 168 hours. Inpatient medications:  acetaminophen  325 mg Oral Once   enoxaparin (LOVENOX) injection  40 mg Subcutaneous [id number]   folic acid  1 mg Oral Daily   lisinopril  2.5 mg Oral Daily   multivitamin with minerals  1 tablet Oral Daily   pantoprazole  40 mg Oral Daily   sodium chloride flush  3 mL Intravenous Q12H   [START ON 03/03/2022] thiamine  100 mg Oral Daily    insulin     dextrose, LORazepam **OR** LORazepam, ondansetron **OR** ondansetron (ZOFRAN) IV, mouth rinse

## 2022-03-02 NOTE — Progress Notes (Signed)
PROGRESS NOTE    Yolanda Flores  [id number] DOB: [birth year] DOA: 02/28/2022 PCP: Ladell Pier, MD  Outpatient Specialists:     Brief Narrative:  Patient is a 37 year old female with history of diabetes mellitus on subcutaneous basaglar insulin 10 units once daily, alcohol abuse (drinks mainly liquor), acute kidney injury, hypertension and pancreatitis.  Looking at patient's prior records, patient has had anion gap acidosis intermittently for the last 3 days, alkalosis.  Patient presented with 1 week history of poor appetite, weakness and bodyaches.  Patient also reported palpitation.  Last alcohol intake was a day prior to presentation.  On presentation to the hospital, patient was found to be tachycardic, with heart rate of 162, with elevated blood pressure.  Patient was also noted to have high anion gap metabolic acidosis.  As documented above, patient has diabetes mellitus, and alcoholic with recent poor p.o. intake.  During the hospital stay, worsening anion gap and acidosis were noted.  Patient was transferred to progressive unit, aggressively hydrated, and started on drip containing bicarbonate in D5 water.  Acidosis has improved significantly.  Anion gap has closed.  Blood sugar has not been significantly elevated.  Patient is currently on thiamine, folate and CIWA protocol.  Current concerns for alcoholic ketoacidosis versus starvation ketoacidosis versus euglycemic diabetic ketoacidosis.  Nephrology team is assisting with patient's management.  Migraine headache has resolved.  03/02/2022: Patient seen alongside patient's boyfriend.  Patient looks a lot better today.  Will proceed with caloric count.  Will continue to monitor renal function, electrolytes, acidosis and anion gap.  Assessment & Plan:   Principal Problem:   High anion gap metabolic acidosis Active Problems:   Hypertension associated with diabetes (HCC)   Insulin dependent type 2 diabetes mellitus (HCC)    Hyperkalemia   Hypomagnesemia   High anion gap metabolic acidosis -See above documentation. -Possible etiology includes alcoholic ketoacidosis versus starvation ketoacidosis versus euglycemic diabetic ketoacidosis.   -Hyponatremia/intermittent hypokalemia may be related to resolving beta hydroxybutyrate. -Continue adequate hydration. -Ensure adequate caloric intake. -Insulin as needed. -Continue to monitor anion gap and lactic acid level. -Continue to monitor renal function and electrolytes.  Insulin dependent type 2 diabetes mellitus ([place]) -Patient insulin subcutaneous basaglar 10 units once daily.   -Basaglar is currently on hold. -Check blood sugar every 2 X 4 hours, then every 4 hours then ACHS. -Correct blood sugar as needed. -See above documentation regarding high anion gap acidosis. -A1c was 9.1%.   Hypertension associated with diabetes ([place]) Continue lisinopril. Blood pressure control is improving.   Hyperkalemia -Resolved.   Hypomagnesemia -Continue to monitor and replete.  Hypophosphatemia:  Monitor and replete. Last phosphorus was 1.7.  Migraine headache: -Resolved.    Alcohol abuse, with dependence syndrome: -Last drink was 11 prior to presentation. -No withdrawal symptoms so far.   -Monitor closely for alcohol withdrawals. -Low threshold to institute CIWA Protocol.  Hepatic steatosis: -Repeat lipid panel revealed total cholesterol of 355, LDL of 189 and triglyceride of 504. -Right upper quadrant ultrasound continues to reveal hepatic steatosis and distended gallbladder. -Diet and exercise.  Optimize weight. -Start patient on Tricor and Lipitor.  Dyslipidemia: -See above documentation.   DVT prophylaxis: Subcutaneous Lovenox. Code Status: Full code Family Communication: Boyfriend was at bedside Disposition Plan: Home in the next 24 to 48 hours.   Consultants:  Nephrology  Procedures:  None  Antimicrobials:   None   Subjective: -Patient seen alongside her boyfriend. -No new complaints. -Patient is feeling better today.   -Migraine  headache has resolved.  Objective: Vitals:   03/01/22 2300 03/02/22 0425 03/02/22 0813 03/02/22 1220  BP:   (!) 132/98 (!) 134/98  Pulse:   83   Resp:   16 20  Temp: 97.9 F (36.6 C) 98.3 F (36.8 C) 98.7 F (37.1 C) 98.2 F (36.8 C)  TempSrc: Oral Oral Oral Oral  SpO2:   99%   Weight:      Height:        Intake/Output Summary (Last 24 hours) at 03/02/2022 1238 Last data filed at 03/01/2022 1834 Gross per 24 hour  Intake 1721.61 ml  Output --  Net 1721.61 ml    Filed Weights   02/28/22 1930 03/01/22 0215  Weight: 90.3 kg 88.8 kg    Examination:  General exam: Appears calm and comfortable  Respiratory system: Clear to auscultation. Respiratory effort normal. Cardiovascular system: S1 & S2 heard Gastrointestinal system: Abdomen is obese, soft and nontender. Central nervous system: Alert and oriented.  Patient moves all extremities. Extremities: No leg edema.  Data Reviewed: I have personally reviewed following labs and imaging studies  CBC: Recent Labs  Lab 02/28/22 2033 02/28/22 2039 03/01/22 0134  WBC 12.2*  --  8.3  NEUTROABS 9.5*  --   --   HGB 13.0 13.9 11.8*  HCT 38.0 41.0 34.4*  MCV 83.7  --  84.7  PLT 442*  --  [id number]    Basic Metabolic Panel: Recent Labs  Lab 02/28/22 2318 03/01/22 0134 03/01/22 1343 03/01/22 1657 03/01/22 2124 03/02/22 0109 03/02/22 0415 03/02/22 1025  NA 136   < > 130* 128* 128* 129* 128*  --   K 5.3*   < > 5.2* 5.0 4.3 4.1 3.8  --   CL 102   < > 97* 97* 94* 92* 90*  --   CO2 16*   < > 11* 11* 19* 21* 23  --   GLUCOSE 114*   < > 121* 178* 202* 172* 155*  --   BUN 12   < > 7 7 6 $ 5* 5*  --   CREATININE 0.79   < > 1.01* 1.10* 1.01* 0.89 0.89  --   CALCIUM 7.6*   < > 8.3* 8.0* 8.3* 8.3* 8.2*  --   MG 1.5*  --  1.7  --   --   --   --  1.9  PHOS  --   --   --  2.2*  --   --   --  1.7*   < > =  values in this interval not displayed.    GFR: Estimated Creatinine Clearance: 101.9 mL/min (by C-G formula based on SCr of 0.89 mg/dL). Liver Function Tests: Recent Labs  Lab 02/28/22 2033 03/02/22 1025  AST 36  --   ALT 19  --   ALKPHOS 81  --   BILITOT 0.6  --   PROT 8.2* 7.0  ALBUMIN 4.0 3.3*    Recent Labs  Lab 02/28/22 2033  LIPASE 20    No results for input(s): "AMMONIA" in the last 168 hours. Coagulation Profile: No results for input(s): "INR", "PROTIME" in the last 168 hours. Cardiac Enzymes: No results for input(s): "CKTOTAL", "CKMB", "CKMBINDEX", "TROPONINI" in the last 168 hours. BNP (last 3 results) No results for input(s): "PROBNP" in the last 8760 hours. HbA1C: Recent Labs    03/01/22 0134  HGBA1C 9.1*    CBG: Recent Labs  Lab 03/02/22 0620 03/02/22 0816 03/02/22 0919 03/02/22 1016 03/02/22 1217  GLUCAP 187*  209* 201* 193* 228*    Lipid Profile: Recent Labs    03/01/22 0133  CHOL 355*  HDL 80  LDLCALC UNABLE TO CALCULATE IF TRIGLYCERIDE OVER 400 mg/dL  TRIG 504*  CHOLHDL 4.4  LDLDIRECT 189*   Thyroid Function Tests: Recent Labs    02/28/22 2318  TSH 0.376    Anemia Panel: No results for input(s): "VITAMINB12", "FOLATE", "FERRITIN", "TIBC", "IRON", "RETICCTPCT" in the last 72 hours. Urine analysis:    Component Value Date/Time   COLORURINE YELLOW 03/01/2022 0237   APPEARANCEUR HAZY (A) 03/01/2022 0237   LABSPEC 1.012 03/01/2022 0237   PHURINE 5.0 03/01/2022 0237   GLUCOSEU NEGATIVE 03/01/2022 0237   HGBUR LARGE (A) 03/01/2022 0237   BILIRUBINUR NEGATIVE 03/01/2022 0237   KETONESUR 20 (A) 03/01/2022 0237   PROTEINUR 100 (A) 03/01/2022 0237   UROBILINOGEN 2.0 (H) 12/31/2009 1757   NITRITE NEGATIVE 03/01/2022 0237   LEUKOCYTESUR MODERATE (A) 03/01/2022 0237   Sepsis Labs: @LABRCNTIP$ (procalcitonin:4,lacticidven:4)  ) Recent Results (from the past 240 hour(s))  Resp panel by RT-PCR (RSV, Flu A&B, Covid) Anterior Nasal  Swab     Status: None   Collection Time: 02/28/22  8:13 PM   Specimen: Anterior Nasal Swab  Result Value Ref Range Status   SARS Coronavirus 2 by RT PCR NEGATIVE NEGATIVE Final   Influenza A by PCR NEGATIVE NEGATIVE Final   Influenza B by PCR NEGATIVE NEGATIVE Final    Comment: (NOTE) The Xpert Xpress SARS-CoV-2/FLU/RSV plus assay is intended as an aid in the diagnosis of influenza from Nasopharyngeal swab specimens and should not be used as a sole basis for treatment. Nasal washings and aspirates are unacceptable for Xpert Xpress SARS-CoV-2/FLU/RSV testing.  Fact Sheet for Patients: EntrepreneurPulse.com.au  Fact Sheet for Healthcare Providers: IncredibleEmployment.be  This test is not yet approved or cleared by the [place] FDA and has been authorized for detection and/or diagnosis of SARS-CoV-2 by FDA under an Emergency Use Authorization (EUA). This EUA will remain in effect (meaning this test can be used) for the duration of the COVID-19 declaration under Section 564(b)(1) of the Act, 21 U.S.C. section 360bbb-3(b)(1), unless the authorization is terminated or revoked.     Resp Syncytial Virus by PCR NEGATIVE NEGATIVE Final    Comment: (NOTE) Fact Sheet for Patients: EntrepreneurPulse.com.au  Fact Sheet for Healthcare Providers: IncredibleEmployment.be  This test is not yet approved or cleared by the [place] FDA and has been authorized for detection and/or diagnosis of SARS-CoV-2 by FDA under an Emergency Use Authorization (EUA). This EUA will remain in effect (meaning this test can be used) for the duration of the COVID-19 declaration under Section 564(b)(1) of the Act, 21 U.S.C. section 360bbb-3(b)(1), unless the authorization is terminated or revoked.  Performed at [facility], [place] [address] [place], [place] [address]          Radiology Studies: US Abdomen Limited RUQ  (LIVER/GB)  Result Date: 03/01/2022 CLINICAL DATA:  Gallbladder disease. EXAM: ULTRASOUND ABDOMEN LIMITED RIGHT UPPER QUADRANT COMPARISON:  Abdomen and pelvis CT, 01/03/2022. FINDINGS: Gallbladder: Gallbladder is distended. No stones or wall thickening. No pericholecystic fluid. Common bile duct: Diameter: 5 mm Liver: Increased parenchymal echogenicity. Decreased through transmission of the sound beam. No liver mass. Portal vein is patent on color Doppler imaging with normal direction of blood flow towards the liver. Other: None. IMPRESSION: 1. No acute findings.  Normal gallbladder.  No bile duct dilation. 2. Increased liver parenchymal echogenicity consistent with hepatic steatosis, which was present  on the prior abdomen and pelvis CT. Electronically Signed   By: Lajean Manes M.D.   On: 03/01/2022 13:39   DG Chest 2 View  Result Date: 02/28/2022 CLINICAL DATA:  Shortness of breath. EXAM: CHEST - 2 VIEW COMPARISON:  Chest radiograph dated 01/03/2022. FINDINGS: The heart size and mediastinal contours are within normal limits. Both lungs are clear. The visualized skeletal structures are unremarkable. IMPRESSION: No active cardiopulmonary disease. Electronically Signed   By: Anner Crete M.D.   On: 02/28/2022 21:08        Scheduled Meds:  acetaminophen  325 mg Oral Once   enoxaparin (LOVENOX) injection  40 mg Subcutaneous [id number]   folic acid  1 mg Oral Daily   lisinopril  2.5 mg Oral Daily   multivitamin with minerals  1 tablet Oral Daily   pantoprazole  40 mg Oral Daily   sodium chloride flush  3 mL Intravenous Q12H   [START ON 03/03/2022] thiamine  100 mg Oral Daily   Continuous Infusions:  insulin     thiamine (VITAMIN B1) injection       LOS: 1 day    Time spent: 55 minutes.    Dana Allan, MD  [facility] Pager #: [phone] 7PM-7AM contact night coverage as above

## 2022-03-02 NOTE — Inpatient Diabetes Management (Signed)
Inpatient Diabetes Program Recommendations  AACE/ADA: New Consensus Statement on Inpatient Glycemic Control (2015)  Target Ranges:  Prepandial:   less than 140 mg/dL      Peak postprandial:   less than 180 mg/dL (1-2 hours)      Critically ill patients:  140 - 180 mg/dL   Lab Results  Component Value Date   GLUCAP 187 (H) 03/02/2022   HGBA1C 9.1 (H) 03/01/2022    Review of Glycemic Control  Diabetes history: DM 2 Outpatient Diabetes medications:  Lantus 10 units daily, Metformin 1000 mg bid Current orders for Inpatient glycemic control:  IV insulin-DKA orders  Inpatient Diabetes Program Recommendations:   Noted patient admitted with metabolic acidoses (most likely unrelated to blood glucose).   Will follow while inpatient.  Thank you, Nani Gasser. Mccoy Testa, RN, MSN, CDE  Diabetes Coordinator Inpatient Glycemic Control Team Team Pager [phone] (8am-5pm) 03/02/2022 7:10 AM

## 2022-03-02 NOTE — Progress Notes (Signed)
Anion gap 15, NA 128, FSBG 200's. Dr. Marlowe Sax updated on patient labs and condition. New orders to decrease rate of dextrose with sodium bicarb and repeat labs in AM as ordered. Pt continues with c/o abdominal cramps related to menses. Oxy and heat for relief. No additional concerns at this time.

## 2022-03-02 NOTE — Progress Notes (Signed)
NUTRITION NOTE RD working remotely.  Consult for Calorie Count. RD alerted RN via secure chat. RD will follow-up on 03/03/22 for results and full initial assessment.    Jarome Matin, MS, RD, LDN, CNSC Clinical Dietitian PRN/Relief staff On-call/weekend pager # available in [facility]

## 2022-03-02 NOTE — Care Management (Signed)
  Transition of Care [facility]) Screening Note   Patient Details  Name: Yolanda Flores Date of Birth: December 31, 1985   Transition of Care [facility]) CM/SW Contact:    Carles Collet, RN Phone Number: 03/02/2022, 1:17 PM    Transition of Care Department [facility]) has reviewed patient and no TOC needs have been identified at this time. We will continue to monitor patient advancement through interdisciplinary progression rounds.   Patient independent from home, adm w weakness, body aches, Suspect alcohol associated ketoacidosis however differential also includes starvation, euglycemic DKA, or lactic acidosis. DM coordinator following.   PCP- [place], added to AVS for patient to schedule follow up.  Coverage - [employer]

## 2022-03-03 DIAGNOSIS — E8729 Other acidosis: Secondary | ICD-10-CM | POA: Diagnosis not present

## 2022-03-03 LAB — GLUCOSE, CAPILLARY: Glucose-Capillary: 175 mg/dL — ABNORMAL HIGH (ref 70–99)

## 2022-03-03 MED ORDER — ATORVASTATIN CALCIUM 40 MG PO TABS: 40.0000 mg | ORAL_TABLET | Freq: Every day | ORAL | 0 refills | Status: DC

## 2022-03-03 MED ORDER — FENOFIBRATE 160 MG PO TABS: 160.0000 mg | ORAL_TABLET | Freq: Every day | ORAL | 0 refills | Status: DC

## 2022-03-03 MED ORDER — POTASSIUM & SODIUM PHOSPHATES 280-160-250 MG PO PACK: 1.0000 | PACK | Freq: Three times a day (TID) | ORAL | 0 refills | Status: AC

## 2022-03-03 NOTE — Progress Notes (Addendum)
Pt given am meds as scheduled for this am before reviewing d/c instructions by this RN.   Discharge instructions reviewed with pt and family/friend at bedside. Copy of instructions given to pt. Note for work printed and given after contacting MD of need for note.  Pt informed her scripts were sent to her pharmacy for pick up.  Pt to be d/c'd via wheelchair with belongings, with family/friend.           Escorted by hospital volunteer.

## 2022-03-03 NOTE — Discharge Summary (Signed)
Physician Discharge Summary  Yolanda Flores [id number] DOB: 27-Sep-1985 DOA: 02/28/2022  PCP: Ladell Pier, MD  Admit date: 02/28/2022 Discharge date: 03/03/2022  Admitted From: Home Disposition: Home  Recommendations for Outpatient Follow-up:  Follow up with PCP in 1-2 weeks Please obtain BMP/CBC/magnesium/phosphorus in one week   Home Health: N/A Equipment/Devices: N/A  Discharge Condition: Stable CODE STATUS: Full code Diet recommendation: Low-salt and low-carb diet.  No alcohol.  Discharge summary: 37 year old female with history of type 2 diabetes on insulin, alcohol use, hypertension and history of pancreatitis presented to the emergency room with about 1 week of poor appetite, weakness and body ache, palpitation.  In the emergency room hemodynamically stable.  She was found tachycardic with heart rate of 162, elevated blood pressures and high anion gap metabolic acidosis.  Anion gap was 26 and bicarb was 14.  Blood glucose over 250.  Admitted with multiple electrolyte abnormalities, anion gap metabolic acidosis likely secondary to starvation and alcoholism.  Renal functions were normal.  High anion gap metabolic acidosis, likely starvation ketosis and alcohol induced ketosis: Admitted to hospital.  Treated conservatively with aggressive IV fluids, bicarbonate.  Symptomatically improved.  Biochemically improved.  Currently with normal oral intake and normal bowel function. Advised adequate hydration at home.  Advised healthy oral intake and caloric intake.  Insulin-dependent type 2 diabetes: She uses 10 units of Lantus at home.  Blood sugars were fairly controlled.  Her A1c is 9.1.  Currently with unreliable follow-up and oral intake. Discussed about increasing dose of Lantus, however patient will keep a diary of blood sugars at home and bring it to primary care physician's office for follow-up.  We did not change any insulin doses.  Hypertension, resume  lisinopril. Hypokalemia, resolved. Hypomagnesemia, adequate. Hypophosphatemia, phosphorus was 1.7 on 2/18.  Will send home with oral replacement.  Alcoholism with alcohol use disorder: Counseled to quit.  No withdrawal symptoms.  She is very motivated.  Hepatic steatosis with LDL 189, triglyceride 504: Agreeable to go on antilipid medications.  Will start patient on atorvastatin and fenofibrate.  Patient is adequately improved.  Going home with outpatient follow-up.    Discharge Diagnoses:  Principal Problem:   High anion gap metabolic acidosis Active Problems:   Insulin dependent type 2 diabetes mellitus (HCC)   Hypertension associated with diabetes ([place])   Hyperkalemia   Hypomagnesemia    Discharge Instructions  Discharge Instructions     Diet - low sodium heart healthy   Complete by: As directed    Diet Carb Modified   Complete by: As directed    Increase activity slowly   Complete by: As directed       Allergies as of 03/03/2022       Reactions   Ibuprofen Nausea And Vomiting        Medication List     TAKE these medications    aspirin-acetaminophen-caffeine 250-250-65 MG tablet Commonly known as: EXCEDRIN MIGRAINE Take 1-2 tablets by mouth every 6 (six) hours as needed for headache.   atorvastatin 40 MG tablet Commonly known as: LIPITOR Take 1 tablet (40 mg total) by mouth daily.   fenofibrate 160 MG tablet Take 1 tablet (160 mg total) by mouth daily.   Lantus SoloStar 100 UNIT/ML Solostar Pen Generic drug: insulin glargine Inject 10 Units into the skin at bedtime. What changed: when to take this   lisinopril 5 MG tablet Commonly known as: ZESTRIL Take 1 tablet (5 mg total) by mouth daily. What changed: how much to  take   Magnesium 100 MG Caps Take 1 capsule by mouth daily.   multivitamin with minerals Tabs tablet Take 1 tablet by mouth daily.   omeprazole 20 MG tablet Commonly known as: PriLOSEC OTC Take 1 tablet (20 mg total) by  mouth 2 (two) times daily before a meal. What changed: when to take this   potassium & sodium phosphates 280-160-250 MG Pack Commonly known as: PHOS-NAK Take 1 packet by mouth 4 (four) times daily -  with meals and at bedtime for 7 days.        Follow-up Information     Ladell Pier, MD Follow up.   Specialty: Internal Medicine Why: call to schedule hospital follow up Contact information: [place] [address] [phone]                Allergies  Allergen Reactions   Ibuprofen Nausea And Vomiting    Consultations: Nephrology   Procedures/Studies: US Abdomen Limited RUQ (LIVER/GB)  Result Date: 03/01/2022 CLINICAL DATA:  Gallbladder disease. EXAM: ULTRASOUND ABDOMEN LIMITED RIGHT UPPER QUADRANT COMPARISON:  Abdomen and pelvis CT, 01/03/2022. FINDINGS: Gallbladder: Gallbladder is distended. No stones or wall thickening. No pericholecystic fluid. Common bile duct: Diameter: 5 mm Liver: Increased parenchymal echogenicity. Decreased through transmission of the sound beam. No liver mass. Portal vein is patent on color Doppler imaging with normal direction of blood flow towards the liver. Other: None. IMPRESSION: 1. No acute findings.  Normal gallbladder.  No bile duct dilation. 2. Increased liver parenchymal echogenicity consistent with hepatic steatosis, which was present on the prior abdomen and pelvis CT. Electronically Signed   By: Lajean Manes M.D.   On: 03/01/2022 13:39   DG Chest 2 View  Result Date: 02/28/2022 CLINICAL DATA:  Shortness of breath. EXAM: CHEST - 2 VIEW COMPARISON:  Chest radiograph dated 01/03/2022. FINDINGS: The heart size and mediastinal contours are within normal limits. Both lungs are clear. The visualized skeletal structures are unremarkable. IMPRESSION: No active cardiopulmonary disease. Electronically Signed   By: Anner Crete M.D.   On: 02/28/2022 21:08   (Echo, Carotid, EGD, Colonoscopy, ERCP)     Subjective: Patient seen in the morning rounds.  Denies any complaints.  Appropriate in the bedside.  Eating regular diet.  Denies any nausea vomiting.  She was put on a calorie count, however since last 24 hours she is eating regular diet and almost finishing her plate.   Discharge Exam: Vitals:   03/03/22 0015 03/03/22 0615  BP: (!) 133/103 (!) 131/104  Pulse: 76 84  Resp: 16 18  Temp: 98.3 F (36.8 C) 98.2 F (36.8 C)  SpO2: 98% 96%   Vitals:   03/02/22 2100 03/03/22 0000 03/03/22 0015 03/03/22 0615  BP: (!) 134/101 (!) 133/103 (!) 133/103 (!) 131/104  Pulse: 75 76 76 84  Resp: 14  16 18  $ Temp:   98.3 F (36.8 C) 98.2 F (36.8 C)  TempSrc:   Oral Oral  SpO2: 99%  98% 96%  Weight:      Height:        General: Pt is alert, awake, not in acute distress Cardiovascular: RRR, S1/S2 +, no rubs, no gallops Respiratory: CTA bilaterally, no wheezing, no rhonchi Abdominal: Soft, NT, ND, bowel sounds + Extremities: no edema, no cyanosis    The results of significant diagnostics from this hospitalization (including imaging, microbiology, ancillary and laboratory) are listed below for reference.     Microbiology: Recent Results (from the past  240 hour(s))  Resp panel by RT-PCR (RSV, Flu A&B, Covid) Anterior Nasal Swab     Status: None   Collection Time: 02/28/22  8:13 PM   Specimen: Anterior Nasal Swab  Result Value Ref Range Status   SARS Coronavirus 2 by RT PCR NEGATIVE NEGATIVE Final   Influenza A by PCR NEGATIVE NEGATIVE Final   Influenza B by PCR NEGATIVE NEGATIVE Final    Comment: (NOTE) The Xpert Xpress SARS-CoV-2/FLU/RSV plus assay is intended as an aid in the diagnosis of influenza from Nasopharyngeal swab specimens and should not be used as a sole basis for treatment. Nasal washings and aspirates are unacceptable for Xpert Xpress SARS-CoV-2/FLU/RSV testing.  Fact Sheet for Patients: EntrepreneurPulse.com.au  Fact Sheet for Healthcare  Providers: IncredibleEmployment.be  This test is not yet approved or cleared by the [place] FDA and has been authorized for detection and/or diagnosis of SARS-CoV-2 by FDA under an Emergency Use Authorization (EUA). This EUA will remain in effect (meaning this test can be used) for the duration of the COVID-19 declaration under Section 564(b)(1) of the Act, 21 U.S.C. section 360bbb-3(b)(1), unless the authorization is terminated or revoked.     Resp Syncytial Virus by PCR NEGATIVE NEGATIVE Final    Comment: (NOTE) Fact Sheet for Patients: EntrepreneurPulse.com.au  Fact Sheet for Healthcare Providers: IncredibleEmployment.be  This test is not yet approved or cleared by the [place] FDA and has been authorized for detection and/or diagnosis of SARS-CoV-2 by FDA under an Emergency Use Authorization (EUA). This EUA will remain in effect (meaning this test can be used) for the duration of the COVID-19 declaration under Section 564(b)(1) of the Act, 21 U.S.C. section 360bbb-3(b)(1), unless the authorization is terminated or revoked.  Performed at [facility], [place] [address], [place], [place] [address]      Labs: BNP (last 3 results) Recent Labs    02/28/22 2033  BNP 6.5   Basic Metabolic Panel: Recent Labs  Lab 02/28/22 2318 03/01/22 0134 03/01/22 1343 03/01/22 1657 03/01/22 2124 03/02/22 0109 03/02/22 0415 03/02/22 1025 03/02/22 1242  NA 136   < > 130* 128* 128* 129* 128*  --  129*  K 5.3*   < > 5.2* 5.0 4.3 4.1 3.8  --  3.8  CL 102   < > 97* 97* 94* 92* 90*  --  91*  CO2 16*   < > 11* 11* 19* 21* 23  --  25  GLUCOSE 114*   < > 121* 178* 202* 172* 155*  --  214*  BUN 12   < > 7 7 6 $ 5* 5*  --  <5*  CREATININE 0.79   < > 1.01* 1.10* 1.01* 0.89 0.89  --  0.84  CALCIUM 7.6*   < > 8.3* 8.0* 8.3* 8.3* 8.2*  --  8.5*  MG 1.5*  --  1.7  --   --   --   --  1.9  --   PHOS  --   --   --  2.2*  --   --    --  1.7*  --    < > = values in this interval not displayed.   Liver Function Tests: Recent Labs  Lab 02/28/22 2033 03/02/22 1025  AST 36  --   ALT 19  --   ALKPHOS 81  --   BILITOT 0.6  --   PROT 8.2* 7.0  ALBUMIN 4.0 3.3*   Recent Labs  Lab 02/28/22 2033  LIPASE 20  No results for input(s): "AMMONIA" in the last 168 hours. CBC: Recent Labs  Lab 02/28/22 2033 02/28/22 2039 03/01/22 0134  WBC 12.2*  --  8.3  NEUTROABS 9.5*  --   --   HGB 13.0 13.9 11.8*  HCT 38.0 41.0 34.4*  MCV 83.7  --  84.7  PLT 442*  --  314   Cardiac Enzymes: No results for input(s): "CKTOTAL", "CKMB", "CKMBINDEX", "TROPONINI" in the last 168 hours. BNP: Invalid input(s): "POCBNP" CBG: Recent Labs  Lab 03/02/22 1427 03/02/22 1635 03/02/22 1909 03/02/22 2153 03/03/22 0614  GLUCAP 201* 190* 132* 141* 175*   D-Dimer No results for input(s): "DDIMER" in the last 72 hours. Hgb A1c Recent Labs    03/01/22 0134  HGBA1C 9.1*   Lipid Profile Recent Labs    03/01/22 0133  CHOL 355*  HDL 80  LDLCALC UNABLE TO CALCULATE IF TRIGLYCERIDE OVER 400 mg/dL  TRIG 504*  CHOLHDL 4.4  LDLDIRECT 189*   Thyroid function studies Recent Labs    02/28/22 2318  TSH 0.376   Anemia work up No results for input(s): "VITAMINB12", "FOLATE", "FERRITIN", "TIBC", "IRON", "RETICCTPCT" in the last 72 hours. Urinalysis    Component Value Date/Time   COLORURINE YELLOW 03/01/2022 0237   APPEARANCEUR HAZY (A) 03/01/2022 0237   LABSPEC 1.012 03/01/2022 0237   PHURINE 5.0 03/01/2022 0237   GLUCOSEU NEGATIVE 03/01/2022 0237   HGBUR LARGE (A) 03/01/2022 0237   BILIRUBINUR NEGATIVE 03/01/2022 0237   KETONESUR 20 (A) 03/01/2022 0237   PROTEINUR 100 (A) 03/01/2022 0237   UROBILINOGEN 2.0 (H) 12/31/2009 1757   NITRITE NEGATIVE 03/01/2022 0237   LEUKOCYTESUR MODERATE (A) 03/01/2022 0237   Sepsis Labs Recent Labs  Lab 02/28/22 2033 03/01/22 0134  WBC 12.2* 8.3   Microbiology Recent Results (from  the past 240 hour(s))  Resp panel by RT-PCR (RSV, Flu A&B, Covid) Anterior Nasal Swab     Status: None   Collection Time: 02/28/22  8:13 PM   Specimen: Anterior Nasal Swab  Result Value Ref Range Status   SARS Coronavirus 2 by RT PCR NEGATIVE NEGATIVE Final   Influenza A by PCR NEGATIVE NEGATIVE Final   Influenza B by PCR NEGATIVE NEGATIVE Final    Comment: (NOTE) The Xpert Xpress SARS-CoV-2/FLU/RSV plus assay is intended as an aid in the diagnosis of influenza from Nasopharyngeal swab specimens and should not be used as a sole basis for treatment. Nasal washings and aspirates are unacceptable for Xpert Xpress SARS-CoV-2/FLU/RSV testing.  Fact Sheet for Patients: EntrepreneurPulse.com.au  Fact Sheet for Healthcare Providers: IncredibleEmployment.be  This test is not yet approved or cleared by the [place] FDA and has been authorized for detection and/or diagnosis of SARS-CoV-2 by FDA under an Emergency Use Authorization (EUA). This EUA will remain in effect (meaning this test can be used) for the duration of the COVID-19 declaration under Section 564(b)(1) of the Act, 21 U.S.C. section 360bbb-3(b)(1), unless the authorization is terminated or revoked.     Resp Syncytial Virus by PCR NEGATIVE NEGATIVE Final    Comment: (NOTE) Fact Sheet for Patients: EntrepreneurPulse.com.au  Fact Sheet for Healthcare Providers: IncredibleEmployment.be  This test is not yet approved or cleared by the [place] FDA and has been authorized for detection and/or diagnosis of SARS-CoV-2 by FDA under an Emergency Use Authorization (EUA). This EUA will remain in effect (meaning this test can be used) for the duration of the COVID-19 declaration under Section 564(b)(1) of the Act, 21 U.S.C. section 360bbb-3(b)(1), unless the authorization  is terminated or revoked.  Performed at [facility], [place] [address]  [place], [place] [address]      Time coordinating discharge:  35 minutes  SIGNED:   Barb Merino, MD  [facility] 03/03/2022, 9:02 AM

## 2022-03-03 NOTE — TOC Transition Note (Addendum)
Transition of Care [facility]) - CM/SW Discharge Note   Patient Details  Name: Yolanda Flores MRN: [id number] Date of Birth: December 09, 1985  Transition of Care [facility] CM/SW Contact:  Zenon Mayo, RN Phone Number: 03/03/2022, 9:35 AM   Clinical Narrative:    Patient is for dc today,  patient to call to make follow up apt with [facility] she is already established there.  Added SA resource to AVS, patient is aware.         Patient Goals and CMS Choice      Discharge Placement                         Discharge Plan and Services Additional resources added to the After Visit Summary for                                       Social Determinants of Health (SDOH) Interventions SDOH Screenings   Food Insecurity: Unknown (01/04/2022)  Housing: Low Risk  (01/04/2022)  Transportation Needs: No Transportation Needs (03/01/2022)  Utilities: Not At Risk (03/01/2022)  Depression (PHQ2-9): Low Risk  (05/13/2021)  Tobacco Use: Medium Risk (03/01/2022)     Readmission Risk Interventions     No data to display

## 2022-03-04 ENCOUNTER — Telehealth: Payer: Self-pay

## 2022-03-04 NOTE — Transitions of Care (Post Inpatient/ED Visit) (Signed)
   03/04/2022  Name: Yolanda Flores MRN: [id number] DOB: [birth year]  Today's TOC FU Call Status: Today's TOC FU Call Status:: Unsuccessul Call (1st Attempt) Unsuccessful Call (1st Attempt) Date: 03/04/22 left message # [phone], call back requested  Attempted to reach the patient regarding the most recent Inpatient/ED visit.  Follow Up Plan: Additional outreach attempts will be made to reach the patient to complete the Transitions of Care (Post Inpatient/ED visit) call.   Signature Eden Lathe, RN

## 2022-03-05 ENCOUNTER — Telehealth: Payer: Self-pay

## 2022-03-05 NOTE — Transitions of Care (Post Inpatient/ED Visit) (Signed)
   03/05/2022  Name: RAYSSA STRAMEL MRN: [id number] DOB: 03/10/1985  Today's TOC FU Call Status: Today's TOC FU Call Status:: Unsuccessful Call (2nd Attempt) Unsuccessful Call (1st Attempt) Date: 03/04/22 Unsuccessful Call (2nd Attempt) Date: 03/05/22  Attempted to reach the patient regarding the most recent Inpatient/ED visit.  Follow Up Plan: Additional outreach attempts will be made to reach the patient to complete the Transitions of Care (Post Inpatient/ED visit) call.   Signature Eden Lathe, RN

## 2022-03-06 ENCOUNTER — Telehealth: Payer: Self-pay

## 2022-03-06 ENCOUNTER — Telehealth: Payer: Self-pay | Admitting: Internal Medicine

## 2022-03-06 DIAGNOSIS — E669 Obesity, unspecified: Secondary | ICD-10-CM

## 2022-03-06 MED ORDER — CONTOUR NEXT TEST VI STRP: ORAL_STRIP | 2 refills | Status: DC

## 2022-03-06 NOTE — Telephone Encounter (Signed)
From the discharge call:  Med list reviewed.  She does not have atorvastatin or fenofibrate yet due to cost. Plans to get them after 03/14/2022 when insurance starts. She does not have prilosec. Her pharmacy told her that the Phos-Nak is hard to get, is there something else that can be ordered in its place? She has basaglar instead of lantus and she ran out today; but plans to pick more up so there is no lapse in insulin coverage.  She has a Contour glucometer but is in need of test strips. The order can be sent to her [facility].  Her insurance does not start until 3/1 so she does not want to see a provider before then. Dr Wynetta Emery does not have any openings until the middle of the March. patient agreeable to being seen at [facility] at beginning of March. Cari Mayers. PA notified and will follow up with patient.

## 2022-03-06 NOTE — Telephone Encounter (Signed)
Copied from [facility] [id number]. Topic: General - Other >> Mar 06, 2022  2:03 PM Cyndi Bender wrote: Reason for CRM: Pt requests that Opal Sidles return her call.

## 2022-03-06 NOTE — Transitions of Care (Post Inpatient/ED Visit) (Signed)
   03/06/2022  Name: Yolanda Flores MRN: [id number] DOB: January 08, 1986  Today's TOC FU Call Status: Today's TOC FU Call Status:: Successful TOC FU Call Competed Unsuccessful Call (1st Attempt) Date: 03/04/22 Unsuccessful Call (2nd Attempt) Date: 03/05/22 [facility] FU Call Complete Date: 03/06/22  Transition Care Management Follow-up Telephone Call Date of Discharge: 03/03/22 Discharge Facility: [facility]) Type of Discharge: Inpatient Admission Primary Inpatient Discharge Diagnosis:: metabolic acidosis How have you been since you were released from the hospital?: Better Any questions or concerns?: Yes Patient Questions/Concerns:: regarding meds.  List reviewed.  She does not have atorvastatin or fenofibrate yet due to cost. Plans to get them after 03/14/2022 when insurance starts. She does not have prilosec. Her pharmacy told her that the Phos-Nak is hard to get, is there something else that can be ordered in its place? She has basaglar instead of lantus and she ran out today; but plans to pick more up so there is no lapse in insulin coverage.  She has a Contour glucometer but is in need of test strips. The order can be sent to her [facility]. Patient Questions/Concerns Addressed: Notified Provider of Patient Questions/Concerns  Items Reviewed: Did you receive and understand the discharge instructions provided?: Yes Medications obtained and verified?: Yes (Medications Reviewed) Any new allergies since your discharge?: No Dietary orders reviewed?: Yes Type of Diet Ordered:: low sodium , heart healthy, carb modified Do you have support at home?: Yes People in Home: significant other  Home Care and Equipment/Supplies: [place] Ordered?: No Any new equipment or medical supplies ordered?: No  Functional Questionnaire: Do you need assistance with bathing/showering or dressing?: No Do you need assistance with meal preparation?: No Do you need assistance with eating?:  No Do you have difficulty maintaining continence: No Do you need assistance with getting out of bed/getting out of a chair/moving?: No Do you have difficulty managing or taking your medications?: No  Folllow up appointments reviewed: PCP Follow-up appointment confirmed?: No (Her insurance does not start until 3/1 so she does not want to see a provider before then. Dr Wynetta Emery does not have any openings until the middle of the March. patient agreeable to being seen at [facility] at beginning of March.) MD Provider Line Number:[phone] Given: No (She has number for PCP office.) [facility] Follow-up appointment confirmed?: NA Do you need transportation to your follow-up appointment?: No Do you understand care options if your condition(s) worsen?: Yes-patient verbalized understanding  Cari Mayers, PA notified of need for appointment and she will follow up with patient.     SIGNATURE  Renaee Munda

## 2022-03-06 NOTE — Telephone Encounter (Signed)
I called patient to inform her that the order for test strips was sent to her pharmacy and to also inform her that Dr Wynetta Emery does not have a substitute for the Phos-Nak.  Message left with call back requested

## 2022-03-06 NOTE — Telephone Encounter (Signed)
I returned call to patient and informed her that the test strips were sent to her [facility] and that Dr Wynetta Emery did not have a substitute for the Phos-Nak.  She said she understood.

## 2022-03-06 NOTE — Telephone Encounter (Signed)
Rx for test strips sent. Will defer phosphate product to PCP.

## 2022-03-17 ENCOUNTER — Telehealth: Payer: Self-pay | Admitting: Internal Medicine

## 2022-03-17 DIAGNOSIS — E1169 Type 2 diabetes mellitus with other specified complication: Secondary | ICD-10-CM

## 2022-03-17 MED ORDER — ONETOUCH ULTRASOFT LANCETS MISC: 12 refills | Status: DC

## 2022-03-17 MED ORDER — ONETOUCH ULTRA VI STRP: ORAL_STRIP | 12 refills | Status: DC

## 2022-03-17 MED ORDER — ONETOUCH ULTRA 2 W/DEVICE KIT: PACK | 0 refills | Status: DC

## 2022-03-17 NOTE — Telephone Encounter (Signed)
Rxn sent to her pharmacy for DM testing supplies.

## 2022-03-17 NOTE — Telephone Encounter (Signed)
Pts insurance cover one touch Glucose meter kit/ Pt needs this RX sent to  [facility], [place]

## 2022-04-02 ENCOUNTER — Ambulatory Visit: Payer: Commercial Managed Care - HMO | Attending: Physician Assistant | Admitting: Physician Assistant

## 2022-04-02 ENCOUNTER — Encounter: Payer: Self-pay | Admitting: Physician Assistant

## 2022-04-02 VITALS — BP 119/87 | HR 87 | Wt 191.8 lb

## 2022-04-02 DIAGNOSIS — E669 Obesity, unspecified: Secondary | ICD-10-CM

## 2022-04-02 DIAGNOSIS — F1010 Alcohol abuse, uncomplicated: Secondary | ICD-10-CM | POA: Diagnosis not present

## 2022-04-02 DIAGNOSIS — E785 Hyperlipidemia, unspecified: Secondary | ICD-10-CM | POA: Diagnosis not present

## 2022-04-02 DIAGNOSIS — I10 Essential (primary) hypertension: Secondary | ICD-10-CM | POA: Diagnosis not present

## 2022-04-02 DIAGNOSIS — E1169 Type 2 diabetes mellitus with other specified complication: Secondary | ICD-10-CM

## 2022-04-02 DIAGNOSIS — Z09 Encounter for follow-up examination after completed treatment for conditions other than malignant neoplasm: Secondary | ICD-10-CM

## 2022-04-02 LAB — GLUCOSE, POCT (MANUAL RESULT ENTRY): POC Glucose: 196 mg/dl — AB (ref 70–99)

## 2022-04-02 MED ORDER — LISINOPRIL 5 MG PO TABS: 2.5000 mg | ORAL_TABLET | Freq: Every day | ORAL | 1 refills | Status: DC

## 2022-04-02 MED ORDER — METFORMIN HCL 1000 MG PO TABS: 1000.0000 mg | ORAL_TABLET | Freq: Two times a day (BID) | ORAL | 3 refills | Status: DC

## 2022-04-02 MED ORDER — FENOFIBRATE 160 MG PO TABS: 160.0000 mg | ORAL_TABLET | Freq: Every day | ORAL | 1 refills | Status: DC

## 2022-04-02 MED ORDER — BASAGLAR TEMPO PEN 100 UNIT/ML ~~LOC~~ SOPN: 14.0000 [IU] | PEN_INJECTOR | Freq: Every day | SUBCUTANEOUS | 2 refills | Status: DC

## 2022-04-02 MED ORDER — ATORVASTATIN CALCIUM 40 MG PO TABS: 40.0000 mg | ORAL_TABLET | Freq: Every day | ORAL | 1 refills | Status: DC

## 2022-04-02 NOTE — Patient Instructions (Signed)
Check blood sugars fasting(after sleep/no food for 6 to 8 hours) and at your "bedtime"  Drink 80-100 ounces water daily  [place].org is the website for narcotics anonymous [place].[employer] (website) or 6194707681 is the information for alcoholics anonymous Both are free and immediately available for help with alcohol and drug use

## 2022-04-02 NOTE — Progress Notes (Signed)
Patient ID: Yolanda Flores, female   DOB: [birth year], 37 y.o.   MRN: [id number]     Yolanda Flores, is a 37 y.o. female  [id number]  [id number]  DOB - 1985-08-19  Chief Complaint  Patient presents with   Hospitalization Follow-up   Medication Refill       Subjective:   Yolanda Flores is a 37 y.o. female here today for a follow up visit After hospitalization 2/16-2/19/2024.  She is doing well.  She has not drank alcohol since her hospitalization.  Blood sugars in the last 2 weeks have been 200-400.  Works 3rd shift.  Currently takin 10 u of basaglar.  She was previously on metformin and tolerated it well but it was topped in December due to abnormal kidney function.  However, her kidney function returned to normal and was normal in February.    From Discharge summary: 37 year old female with history of type 2 diabetes on insulin, alcohol use, hypertension and history of pancreatitis presented to the emergency room with about 1 week of poor appetite, weakness and body ache, palpitation.  In the emergency room hemodynamically stable.  She was found tachycardic with heart rate of 162, elevated blood pressures and high anion gap metabolic acidosis.  Anion gap was 26 and bicarb was 14.  Blood glucose over 250.  Admitted with multiple electrolyte abnormalities, anion gap metabolic acidosis likely secondary to starvation and alcoholism.  Renal functions were normal.   High anion gap metabolic acidosis, likely starvation ketosis and alcohol induced ketosis: Admitted to hospital.  Treated conservatively with aggressive IV fluids, bicarbonate.  Symptomatically improved.  Biochemically improved.  Currently with normal oral intake and normal bowel function. Advised adequate hydration at home.  Advised healthy oral intake and caloric intake.   Insulin-dependent type 2 diabetes: She uses 10 units of Lantus at home.  Blood sugars were fairly controlled.  Her A1c is 9.1.  Currently with unreliable  follow-up and oral intake. Discussed about increasing dose of Lantus, however patient will keep a diary of blood sugars at home and bring it to primary care physician's office for follow-up.  We did not change any insulin doses.   Hypertension, resume lisinopril. Hypokalemia, resolved. Hypomagnesemia, adequate. Hypophosphatemia, phosphorus was 1.7 on 2/18.  Will send home with oral replacement.   Alcoholism with alcohol use disorder: Counseled to quit.  No withdrawal symptoms.  She is very motivated.   Hepatic steatosis with LDL 189, triglyceride 504: Agreeable to go on antilipid medications.  Will start patient on atorvastatin and fenofibrate.   Patient is adequately improved.  Going home with outpatient follow-up.       Discharge Diagnoses:  Principal Problem:   High anion gap metabolic acidosis Active Problems:   Insulin dependent type 2 diabetes mellitus ([place])   Hypertension associated with diabetes ([place])   Hyperkalemia   Hypomagnesemia   No problems updated.  ALLERGIES: Allergies  Allergen Reactions   Ibuprofen Nausea And Vomiting    PAST MEDICAL HISTORY: Past Medical History:  Diagnosis Date   AKI (acute kidney injury) ([place])    Hypertension    Pancreatitis     MEDICATIONS AT HOME: Prior to Admission medications   Medication Sig Start Date End Date Taking? Authorizing Provider  aspirin-acetaminophen-caffeine (EXCEDRIN MIGRAINE) [phone] MG tablet Take 1-2 tablets by mouth every 6 (six) hours as needed for headache.   Yes [provider]  Blood Glucose Monitoring Suppl (ONE TOUCH ULTRA 2) w/Device KIT UAD 03/17/22  Yes Ladell Pier, MD  glucose blood (ONETOUCH ULTRA) test strip Use as instructed. 03/17/22  Yes Ladell Pier, MD  Insulin Glargine w/ Trans Port (BASAGLAR TEMPO PEN) 100 UNIT/ML SOPN Inject 14 Units into the skin daily. 04/02/22  Yes Jamileth Putzier, Levada Dy M, PA-C  Lancets [facility] ULTRASOFT) lancets Use as instructed 03/17/22  Yes Ladell Pier, MD  Magnesium 100 MG CAPS Take 1 capsule by mouth daily.   Yes [provider]  metFORMIN (GLUCOPHAGE) 1000 MG tablet Take 1 tablet (1,000 mg total) by mouth 2 (two) times daily with a meal. 04/02/22  Yes Ahron Hulbert M, PA-C  Multiple Vitamin (MULTIVITAMIN WITH MINERALS) TABS tablet Take 1 tablet by mouth daily. 01/18/19  Yes Hosie Poisson, MD  omeprazole (PRILOSEC OTC) 20 MG tablet Take 1 tablet (20 mg total) by mouth 2 (two) times daily before a meal. Patient taking differently: Take 20 mg by mouth daily. 01/05/22  Yes Dessa Phi, DO  atorvastatin (LIPITOR) 40 MG tablet Take 1 tablet (40 mg total) by mouth daily. 04/02/22 05/02/22  Argentina Donovan, PA-C  fenofibrate 160 MG tablet Take 1 tablet (160 mg total) by mouth daily. 04/02/22 05/02/22  Argentina Donovan, PA-C  lisinopril (ZESTRIL) 5 MG tablet Take 0.5 tablets (2.5 mg total) by mouth daily. 04/02/22   Christen Wardrop, Dionne Bucy, PA-C    ROS: Neg HEENT Neg resp Neg cardiac Neg GI Neg GU Neg MS Neg psych Neg neuro  Objective:   Vitals:   04/02/22 1442  BP: 119/87  Pulse: 87  SpO2: 99%  Weight: 191 lb 12.8 oz (87 kg)   Exam General appearance : Awake, alert, not in any distress. Speech Clear. Not toxic looking HEENT: Atraumatic and Normocephalic Neck: Supple, no JVD. No cervical lymphadenopathy.  Chest: Good air entry bilaterally, CTAB.  No rales/rhonchi/wheezing CVS: S1 S2 regular, no murmurs.  Extremities: B/L Lower Ext shows no edema, both legs are warm to touch Neurology: Awake alert, and oriented X 3, CN II-XII intact, Non focal Skin: No Rash  Data Review Lab Results  Component Value Date   HGBA1C 9.1 (H) 03/01/2022   HGBA1C 5.8 (H) 12/04/2021   HGBA1C 7.3 (H) 04/12/2021    Assessment & Plan   1. Diabetes mellitus type 2 in obese ([place]) Uncontrolled-increase basaglar from 10 to 14 units and restart metformin 500bid then if tolerated increase to 1000mg  bid. 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  Dispense: 9 mL; Refill: 2 - Comprehensive metabolic panel - metFORMIN (GLUCOPHAGE) 1000 MG tablet; Take 1 tablet (1,000 mg total) by mouth 2 (two) times daily with a meal.  Dispense: 180 tablet; Refill: 3  2. Essential hypertension controlled - Comprehensive metabolic panel - lisinopril (ZESTRIL) 5 MG tablet; Take 0.5 tablets (2.5 mg total) by mouth daily.  Dispense: 45 tablet; Refill: 1  3. Dyslipidemia - atorvastatin (LIPITOR) 40 MG tablet; Take 1 tablet (40 mg total) by mouth daily.  Dispense: 90 tablet; Refill: 1 - fenofibrate 160 MG tablet; Take 1 tablet (160 mg total) by mouth daily.  Dispense: 90 tablet; Refill: 1 - Comprehensive metabolic panel  4. Alcohol abuse Congratulations on cessation Recommend 12 step recovery to stay stopped [place].org is the website for narcotics anonymous [place] (website) or 450-427-6993 is the information for alcoholics anonymous Both are free  and immediately available for help with alcohol and drug use I have counseled the patient at length about substance abuse and addiction.  12 step meetings/recovery recommended.  Local 12 step meeting lists were given and attendance was encouraged.  Patient expresses understanding.     5. Encounter for examination following treatment at hospital    Return in about 4 weeks (around 04/30/2022) for [facility] for DM and BMP.  The patient was given clear instructions to go to ER or return to medical center if symptoms don't improve, worsen or new problems develop. The patient verbalized understanding. The patient was told to call to get lab results if they haven't heard anything in the next week.      Freeman Caldron, PA-C [facility] and [facility] [place],  [place]   04/02/2022, 4:07 PM

## 2022-04-03 LAB — COMPREHENSIVE METABOLIC PANEL
ALT: 30 IU/L (ref 0–32)
AST: 32 IU/L (ref 0–40)
Albumin/Globulin Ratio: 1.4 (ref 1.2–2.2)
Albumin: 4.4 g/dL (ref 3.9–4.9)
Alkaline Phosphatase: 74 IU/L (ref 44–121)
BUN/Creatinine Ratio: 17 (ref 9–23)
BUN: 19 mg/dL (ref 6–20)
Bilirubin Total: 0.5 mg/dL (ref 0.0–1.2)
CO2: 22 mmol/L (ref 20–29)
Calcium: 10.3 mg/dL — ABNORMAL HIGH (ref 8.7–10.2)
Chloride: 95 mmol/L — ABNORMAL LOW (ref 96–106)
Creatinine, Ser: 1.1 mg/dL — ABNORMAL HIGH (ref 0.57–1.00)
Globulin, Total: 3.1 g/dL (ref 1.5–4.5)
Glucose: 228 mg/dL — ABNORMAL HIGH (ref 70–99)
Potassium: 5.1 mmol/L (ref 3.5–5.2)
Sodium: 135 mmol/L (ref 134–144)
Total Protein: 7.5 g/dL (ref 6.0–8.5)
eGFR: 68 mL/min/{1.73_m2} (ref >59)

## 2022-04-04 ENCOUNTER — Telehealth: Payer: Self-pay

## 2022-04-04 NOTE — Telephone Encounter (Signed)
-----   Message from Argentina Donovan, [place] sent at 04/03/2022  5:10 PM EDT ----- Increase water intake and work on blood sugar control as we discussed.  Other labs are essentially normal.  Thanks, Freeman Caldron, PA-C

## 2022-04-04 NOTE — Telephone Encounter (Signed)
Called and left voicemail, following up on any question or concerns. Viewed results from [employer] [facility]

## 2022-05-01 ENCOUNTER — Ambulatory Visit: Payer: Commercial Managed Care - HMO | Admitting: Pharmacist

## 2022-05-15 ENCOUNTER — Other Ambulatory Visit: Payer: Self-pay

## 2022-05-15 ENCOUNTER — Encounter (HOSPITAL_COMMUNITY): Payer: Self-pay

## 2022-05-15 ENCOUNTER — Inpatient Hospital Stay (HOSPITAL_COMMUNITY)
Admission: EM | Admit: 2022-05-15 | Discharge: 2022-05-17 | DRG: 641 | Disposition: A | Discharge status: Home / Self Care | Payer: Commercial Managed Care - HMO | Attending: Internal Medicine | Admitting: Internal Medicine

## 2022-05-15 DIAGNOSIS — K219 Gastro-esophageal reflux disease without esophagitis: Secondary | ICD-10-CM | POA: Diagnosis present

## 2022-05-15 DIAGNOSIS — E1165 Type 2 diabetes mellitus with hyperglycemia: Secondary | ICD-10-CM | POA: Diagnosis present

## 2022-05-15 DIAGNOSIS — Z6832 Body mass index (BMI) 32.0-32.9, adult: Secondary | ICD-10-CM

## 2022-05-15 DIAGNOSIS — Z87891 Personal history of nicotine dependence: Secondary | ICD-10-CM | POA: Diagnosis not present

## 2022-05-15 DIAGNOSIS — I152 Hypertension secondary to endocrine disorders: Secondary | ICD-10-CM | POA: Diagnosis present

## 2022-05-15 DIAGNOSIS — E1169 Type 2 diabetes mellitus with other specified complication: Secondary | ICD-10-CM | POA: Diagnosis present

## 2022-05-15 DIAGNOSIS — F1010 Alcohol abuse, uncomplicated: Secondary | ICD-10-CM | POA: Diagnosis present

## 2022-05-15 DIAGNOSIS — E872 Acidosis, unspecified: Secondary | ICD-10-CM | POA: Diagnosis present

## 2022-05-15 DIAGNOSIS — E871 Hypo-osmolality and hyponatremia: Secondary | ICD-10-CM | POA: Diagnosis present

## 2022-05-15 DIAGNOSIS — Z7984 Long term (current) use of oral hypoglycemic drugs: Secondary | ICD-10-CM | POA: Diagnosis not present

## 2022-05-15 DIAGNOSIS — Z886 Allergy status to analgesic agent status: Secondary | ICD-10-CM | POA: Diagnosis not present

## 2022-05-15 DIAGNOSIS — E878 Other disorders of electrolyte and fluid balance, not elsewhere classified: Secondary | ICD-10-CM | POA: Diagnosis present

## 2022-05-15 DIAGNOSIS — Z8249 Family history of ischemic heart disease and other diseases of the circulatory system: Secondary | ICD-10-CM

## 2022-05-15 DIAGNOSIS — E119 Type 2 diabetes mellitus without complications: Secondary | ICD-10-CM

## 2022-05-15 DIAGNOSIS — N179 Acute kidney failure, unspecified: Secondary | ICD-10-CM | POA: Diagnosis present

## 2022-05-15 DIAGNOSIS — R000 Tachycardia, unspecified: Secondary | ICD-10-CM | POA: Diagnosis present

## 2022-05-15 DIAGNOSIS — K860 Alcohol-induced chronic pancreatitis: Secondary | ICD-10-CM | POA: Diagnosis present

## 2022-05-15 DIAGNOSIS — Z79899 Other long term (current) drug therapy: Secondary | ICD-10-CM

## 2022-05-15 DIAGNOSIS — R112 Nausea with vomiting, unspecified: Secondary | ICD-10-CM | POA: Diagnosis present

## 2022-05-15 DIAGNOSIS — R7401 Elevation of levels of liver transaminase levels: Secondary | ICD-10-CM | POA: Diagnosis present

## 2022-05-15 DIAGNOSIS — E781 Pure hyperglyceridemia: Secondary | ICD-10-CM | POA: Diagnosis present

## 2022-05-15 DIAGNOSIS — E860 Dehydration: Principal | ICD-10-CM | POA: Diagnosis present

## 2022-05-15 DIAGNOSIS — E1159 Type 2 diabetes mellitus with other circulatory complications: Secondary | ICD-10-CM | POA: Diagnosis present

## 2022-05-15 DIAGNOSIS — E669 Obesity, unspecified: Secondary | ICD-10-CM | POA: Diagnosis present

## 2022-05-15 DIAGNOSIS — R12 Heartburn: Secondary | ICD-10-CM | POA: Diagnosis present

## 2022-05-15 DIAGNOSIS — R9431 Abnormal electrocardiogram [ECG] [EKG]: Secondary | ICD-10-CM | POA: Diagnosis present

## 2022-05-15 DIAGNOSIS — Z833 Family history of diabetes mellitus: Secondary | ICD-10-CM | POA: Diagnosis not present

## 2022-05-15 DIAGNOSIS — R7989 Other specified abnormal findings of blood chemistry: Secondary | ICD-10-CM | POA: Diagnosis present

## 2022-05-15 HISTORY — DX: Type 2 diabetes mellitus without complications: E11.9

## 2022-05-15 HISTORY — DX: Hyperlipidemia, unspecified: E78.5

## 2022-05-15 LAB — CBC
HCT: 38.2 % (ref 36.0–46.0)
Hemoglobin: 13.2 g/dL (ref 12.0–15.0)
MCH: 28.3 pg (ref 26.0–34.0)
MCHC: 34.6 g/dL (ref 30.0–36.0)
MCV: 81.8 fL (ref 80.0–100.0)
Platelets: 379 10*3/uL (ref 150–400)
RBC: 4.67 MIL/uL (ref 3.87–5.11)
RDW: 14.8 % (ref 11.5–15.5)
WBC: 6.7 10*3/uL (ref 4.0–10.5)
nRBC: 0 % (ref 0.0–0.2)

## 2022-05-15 LAB — COMPREHENSIVE METABOLIC PANEL
ALT: 56 U/L — ABNORMAL HIGH (ref 0–44)
AST: 69 U/L — ABNORMAL HIGH (ref 15–41)
Albumin: 4.3 g/dL (ref 3.5–5.0)
Alkaline Phosphatase: 59 U/L (ref 38–126)
Anion gap: 20 — ABNORMAL HIGH (ref 5–15)
BUN: 41 mg/dL — ABNORMAL HIGH (ref 6–20)
CO2: 20 mmol/L — ABNORMAL LOW (ref 22–32)
Calcium: 8.3 mg/dL — ABNORMAL LOW (ref 8.9–10.3)
Chloride: 87 mmol/L — ABNORMAL LOW (ref 98–111)
Creatinine, Ser: 3.08 mg/dL — ABNORMAL HIGH (ref 0.44–1.00)
GFR, Estimated: 20 mL/min — ABNORMAL LOW (ref >60)
Glucose, Bld: 110 mg/dL — ABNORMAL HIGH (ref 70–99)
Potassium: 3.9 mmol/L (ref 3.5–5.1)
Sodium: 127 mmol/L — ABNORMAL LOW (ref 135–145)
Total Bilirubin: 1.4 mg/dL — ABNORMAL HIGH (ref 0.3–1.2)
Total Protein: 8.2 g/dL — ABNORMAL HIGH (ref 6.5–8.1)

## 2022-05-15 LAB — URINALYSIS, ROUTINE W REFLEX MICROSCOPIC
Bilirubin Urine: NEGATIVE
Glucose, UA: NEGATIVE mg/dL
Ketones, ur: 5 mg/dL — AB
Leukocytes,Ua: NEGATIVE
Nitrite: NEGATIVE
Protein, ur: 100 mg/dL — AB
Specific Gravity, Urine: 1.014 (ref 1.005–1.030)
pH: 5 (ref 5.0–8.0)

## 2022-05-15 LAB — I-STAT BETA HCG BLOOD, ED (MC, WL, AP ONLY): I-stat hCG, quantitative: <5 m[IU]/mL (ref <5)

## 2022-05-15 LAB — LIPASE, BLOOD: Lipase: 21 U/L (ref 11–51)

## 2022-05-15 MED ORDER — ASPIRIN-ACETAMINOPHEN-CAFFEINE 250-250-65 MG PO TABS
1.0000 | ORAL_TABLET | Freq: Once | ORAL | Status: AC
  Administered 2022-05-15: 1 via ORAL
  Filled 2022-05-15: qty 1

## 2022-05-15 MED ORDER — SODIUM CHLORIDE 0.9 % IV BOLUS
1000.0000 mL | Freq: Once | INTRAVENOUS | Status: AC
  Administered 2022-05-16: 1000 mL via INTRAVENOUS

## 2022-05-15 MED ORDER — ONDANSETRON HCL 4 MG/2ML IJ SOLN
4.0000 mg | Freq: Once | INTRAMUSCULAR | Status: AC
  Administered 2022-05-16: 4 mg via INTRAVENOUS
  Filled 2022-05-15: qty 2

## 2022-05-15 MED ORDER — ALUM & MAG HYDROXIDE-SIMETH 200-200-20 MG/5ML PO SUSP
30.0000 mL | Freq: Once | ORAL | Status: AC
  Administered 2022-05-15: 30 mL via ORAL
  Filled 2022-05-15: qty 30

## 2022-05-15 MED ORDER — SODIUM CHLORIDE 0.9 % IV BOLUS
1000.0000 mL | Freq: Once | INTRAVENOUS | Status: AC
  Administered 2022-05-15: 1000 mL via INTRAVENOUS

## 2022-05-15 NOTE — H&P (Signed)
History and Physical    Yolanda Flores [id number] DOB: 1985-05-13 DOA: 05/15/2022  PCP: Marcine Matar, MD Patient coming from: Home  Chief Complaint: intractable vomiting  HPI: Yolanda Flores is a 37 y.o. female with medical history significant of pancreatitis, hypertriglyceridemia, HTN, DM2, ETOH abuse who presents for nausea, vomiting.  She notes that she thinks her reflux and chronic binge drinking are the cause.  She notes occasional binge drinking.  This week she had a family member pass and she is aware that she likely drank too much.  She notes 3 days ago awakening with nausea and has been unable to keep anything, including fluids down, in that time.  She vomited with any intake.  She noted feeling fatigued.  She notes having bad reflux, worse with spicy foods.  She takes her omeprazole when she has symptoms, and sometimes up to 3 times per day.  She does not feel that it is working for her.  She notes burning chest pain, but no blood in her emesis, no abdominal pain, no diarrhea.  She has not been taking her fibrate or statin.  She notes that the statin makes her feel "off" and she doesn't want to take it.  She has been intermittently taking her insulin while she was throwing up.  Of note, she reports intermittent swelling of the left foot/ankle, but it is not presently there.  She would like someone to look at it if it happens again.   ED Course: In the ED, she had a dose of zofran and her nausea greatly improved.  She was noted to have low Na of 127, low Cl of 87, low bicarb of 20 and a BUN of 41 and Cr of 3.08, baseline is normal. AG was 20.  She received 2L of NS in the ED. AST and ALT were mildly elevated.  Lipase was 21.  T bili was 1.4.  WBC was normal and H/H were also normal.  EKG showed sinus tachycardia with prolonged QTc of 533.   Review of Systems: As per HPI otherwise all other systems reviewed and are negative.   Past Medical History:  Diagnosis Date   AKI (acute  kidney injury) (HCC)    Diabetes mellitus (HCC)    HLD (hyperlipidemia)    Hypertension    Pancreatitis     Past Surgical History:  Procedure Laterality Date   FOOT SURGERY      Social History  reports that she quit smoking about 3 years ago. Her smoking use included cigarettes. She smoked an average of 4 packs per day. She has never used smokeless tobacco. She reports that she does not currently use alcohol. She reports that she does not use drugs.  Allergies  Allergen Reactions   Ibuprofen Nausea And Vomiting    Family History  Problem Relation Age of Onset   Hypertension Mother    Diabetes Mother    Hypertension Father    Hypertension Brother     Prior to Admission medications   Medication Sig Start Date End Date Taking? Authorizing Provider  aspirin-acetaminophen-caffeine (EXCEDRIN MIGRAINE) [phone] MG tablet Take 1-2 tablets by mouth every 6 (six) hours as needed for headache.    [provider]  atorvastatin (LIPITOR) 40 MG tablet Take 1 tablet (40 mg total) by mouth daily. 04/02/22 05/02/22  Anders Simmonds, PA-C  Blood Glucose Monitoring Suppl (ONE TOUCH ULTRA 2) w/Device KIT UAD 03/17/22   Marcine Matar, MD  fenofibrate 160 MG tablet Take  1 tablet (160 mg total) by mouth daily. 04/02/22 05/02/22  Anders Simmonds, PA-C  glucose blood (ONETOUCH ULTRA) test strip Use as instructed. 03/17/22   Marcine Matar, MD  Insulin Glargine w/ Trans Port (BASAGLAR TEMPO PEN) 100 UNIT/ML SOPN Inject 14 Units into the skin daily. 04/02/22   Anders Simmonds, PA-C  Lancets [facility] ULTRASOFT) lancets Use as instructed 03/17/22   Marcine Matar, MD  lisinopril (ZESTRIL) 5 MG tablet Take 0.5 tablets (2.5 mg total) by mouth daily. 04/02/22   Anders Simmonds, PA-C  Magnesium 100 MG CAPS Take 1 capsule by mouth daily.    [provider]  metFORMIN (GLUCOPHAGE) 1000 MG tablet Take 1 tablet (1,000 mg total) by mouth 2 (two) times daily with a meal. 04/02/22    McClung, Marzella Schlein, PA-C  Multiple Vitamin (MULTIVITAMIN WITH MINERALS) TABS tablet Take 1 tablet by mouth daily. 01/18/19   Kathlen Mody, MD  omeprazole (PRILOSEC OTC) 20 MG tablet Take 1 tablet (20 mg total) by mouth 2 (two) times daily before a meal. Patient taking differently: Take 20 mg by mouth daily. 01/05/22   Noralee Stain, DO    Physical Exam: Vitals:   05/15/22 2017 05/15/22 2018 05/15/22 2020 05/16/22 0011  BP: 126/80 126/80  132/78  Pulse:  (!) 120  97  Resp:  18  20  Temp: 98.2 F (36.8 C) 98.2 F (36.8 C)  98 F (36.7 C)  TempSrc: Oral Oral  Oral  SpO2:  100%  100%  Weight:   90.3 kg   Height:   5\' 6"  (1.676 m)     Constitutional: NAD, calm, comfortable, appears fatigued Eyes: + conjunctival injection ENMT: Mucous membranes are moderately dry.  Neck: normal, supple Respiratory: CTAB, no wheezing noted, no rales, normal WOB Cardiovascular:Mildly tachycardic, regular, no murmur noted, no pedal edema on my exam.  Abdomen: +BS, NT, ND Musculoskeletal:  Normal muscle tone.  Skin: no rashes, lesions, ulcers. No induration on exposed skin Psychiatric: Normal judgment and insight. Alert and oriented x 3. Normal mood.     Labs on Admission: I have personally reviewed following labs and imaging studies  CBC: Recent Labs  Lab 05/15/22 2027  WBC 6.7  HGB 13.2  HCT 38.2  MCV 81.8  PLT 379    Basic Metabolic Panel: Recent Labs  Lab 05/15/22 2027  NA 127*  K 3.9  CL 87*  CO2 20*  GLUCOSE 110*  BUN 41*  CREATININE 3.08*  CALCIUM 8.3*    GFR: Estimated Creatinine Clearance: 29.1 mL/min (A) (by C-G formula based on SCr of 3.08 mg/dL (H)).  Liver Function Tests: Recent Labs  Lab 05/15/22 2027  AST 69*  ALT 56*  ALKPHOS 59  BILITOT 1.4*  PROT 8.2*  ALBUMIN 4.3    Urine analysis:    Component Value Date/Time   COLORURINE YELLOW 05/15/2022 2120   APPEARANCEUR CLOUDY (A) 05/15/2022 2120   LABSPEC 1.014 05/15/2022 2120   PHURINE 5.0 05/15/2022  2120   GLUCOSEU NEGATIVE 05/15/2022 2120   HGBUR SMALL (A) 05/15/2022 2120   BILIRUBINUR NEGATIVE 05/15/2022 2120   KETONESUR 5 (A) 05/15/2022 2120   PROTEINUR 100 (A) 05/15/2022 2120   UROBILINOGEN 2.0 (H) 12/31/2009 1757   NITRITE NEGATIVE 05/15/2022 2120   LEUKOCYTESUR NEGATIVE 05/15/2022 2120    Radiological Exams on Admission: No results found.  EKG: Independently reviewed. Prolonged QTc, sinus tachycardia  Assessment/Plan  AKI (acute kidney injury) (HCC) Intractable nausea and vomiting Alcohol abuse - AKI is  related to acute dehydration and vomiting - Nausea and vomiting improved in ED with dose of Zofran - Trial of full liquid diet - 2L of IVF given in the ED - IVF at 100cc/hr for 10 hours with NS - Nausea control with zofran, monitor QT interval with EKG in the AM - Advised about continued ETOH, consult TOC for resources.  - Trend Creatinine - GI cocktail as needed - Morphine for pain - Phenergan for nausea  Hypertriglyceridemia - Restart fibrate - consider a lower dose of statin on discharge   Hyponatremia, hypochloremia Mild metabolic acidosis, AG elevated - Anticipate hyponatremia related to dehydration given above, received 2L NS in the ED - Monitor BMET for improvement and avoid rapid rise of subacute Na depletion - Likely starvation ketosis given above - IVF, advance diet as tolerated, starting with full liquid  Hypertension associated with diabetes (HCC) - Lisinopril held during acute illness, monitor    Abnormal LFTs - Likely related to vomiting - Trend    Prolonged QT interval - Monitor, EKG in the AM    Heartburn - She is taking her omeprazole after meals, counseled on taking prior to meals - PPI with protonix prior to breakfast    Type 2 diabetes mellitus with obesity (HCC) - Continue insulin at 14 units, SSI - Hold if not eating.    DVT prophylaxis: Xarelto Code Status:   Full  Family Communication:  Fiance at bedside  Disposition  Plan:   Patient is from:  Home  Anticipated DC to:  Home  Anticipated DC date:  05/18/22  Anticipated DC barriers: Ability to tolerate food and liquids, renal function improvement.  Consults called:  None  Admission status:  IP, med surg   Severity of Illness: The appropriate patient status for this patient is INPATIENT. Inpatient status is judged to be reasonable and necessary in order to provide the required intensity of service to ensure the patient's safety. The patient's presenting symptoms, physical exam findings, and initial radiographic and laboratory data in the context of their chronic comorbidities is felt to place them at high risk for further clinical deterioration. Furthermore, it is not anticipated that the patient will be medically stable for discharge from the hospital within 2 midnights of admission.   * I certify that at the point of admission it is my clinical judgment that the patient will require inpatient hospital care spanning beyond 2 midnights from the point of admission due to high intensity of service, high risk for further deterioration and high frequency of surveillance required.Debe Coder MD [facility]  How to contact the [facility] Attending or Consulting provider 7A - 7P or covering provider during after hours 7P -7A, for this patient?   Check the care team in [facility] and look for a) attending/consulting TR[facility] provider listed and b) the [facility] team listed Log into [email or web address] and use [place]'s universal password to access. If you do not have the password, please contact the hospital operator. Locate the [facility] provider you are looking for under [facility] and page to a number that you can be directly reached. If you still have difficulty reaching the provider, please page the [facility] (Director on Call) for the Hospitalists listed on amion for assistance.  05/16/2022, 12:32 AM

## 2022-05-15 NOTE — ED Provider Notes (Signed)
[place] EMERGENCY DEPARTMENT AT [facility] Provider Note   CSN: [id number] Arrival date & time: 05/15/22  2011     History  Chief Complaint  Patient presents with   Nausea   Emesis    Yolanda Flores is a 37 y.o. female.  The history is provided by the patient and medical records. No language interpreter was used.  Emesis    36 year old female with significant history of pancreatitis, diabetes, hypertension, alcohol abuse, brought to via EMS with complaints of nausea vomiting.  Patient report for the past 3 to 4 days she has had persistent nausea and vomiting.  Vomiting is nonbloody nonbilious.  She has decreased bowel movement but able to pass flatus.  She is urinating fine.  She does not endorse any abdominal pain but does endorse some throbbing headache.  She feels her heart is racing whenever she moves around in bed.  No complaint of congestion sore throat sneezing or coughing no urinary symptoms no vaginal bleeding or vaginal discharge.  She does admits to alcohol use last use was today.  She denies marijuana use or tobacco use.  She has had pancreatitis in the past but states that this felt different.  She also endorsed midsternal burning sensation for the past few days that felt similar to her heartburn.  She requesting for medication for that.  No recent sick contact.  Endorses throbbing headache.  Denies neck stiffness fever or vision changes.  Home Medications Prior to Admission medications   Medication Sig Start Date End Date Taking? Authorizing Provider  aspirin-acetaminophen-caffeine (EXCEDRIN MIGRAINE) [phone] MG tablet Take 1-2 tablets by mouth every 6 (six) hours as needed for headache.    [provider]  atorvastatin (LIPITOR) 40 MG tablet Take 1 tablet (40 mg total) by mouth daily. 04/02/22 05/02/22  Anders Simmonds, PA-C  Blood Glucose Monitoring Suppl (ONE TOUCH ULTRA 2) w/Device KIT UAD 03/17/22   Marcine Matar, MD  fenofibrate 160 MG  tablet Take 1 tablet (160 mg total) by mouth daily. 04/02/22 05/02/22  Anders Simmonds, PA-C  glucose blood (ONETOUCH ULTRA) test strip Use as instructed. 03/17/22   Marcine Matar, MD  Insulin Glargine w/ Trans Port (BASAGLAR TEMPO PEN) 100 UNIT/ML SOPN Inject 14 Units into the skin daily. 04/02/22   Anders Simmonds, PA-C  Lancets [facility] ULTRASOFT) lancets Use as instructed 03/17/22   Marcine Matar, MD  lisinopril (ZESTRIL) 5 MG tablet Take 0.5 tablets (2.5 mg total) by mouth daily. 04/02/22   Anders Simmonds, PA-C  Magnesium 100 MG CAPS Take 1 capsule by mouth daily.    [provider]  metFORMIN (GLUCOPHAGE) 1000 MG tablet Take 1 tablet (1,000 mg total) by mouth 2 (two) times daily with a meal. 04/02/22   McClung, Marzella Schlein, PA-C  Multiple Vitamin (MULTIVITAMIN WITH MINERALS) TABS tablet Take 1 tablet by mouth daily. 01/18/19   Kathlen Mody, MD  omeprazole (PRILOSEC OTC) 20 MG tablet Take 1 tablet (20 mg total) by mouth 2 (two) times daily before a meal. Patient taking differently: Take 20 mg by mouth daily. 01/05/22   Noralee Stain, DO      Allergies    Ibuprofen    Review of Systems   Review of Systems  Gastrointestinal:  Positive for vomiting.  All other systems reviewed and are negative.   Physical Exam Updated Vital Signs BP 126/80 (BP Location: Left Arm)   Pulse (!) 120   Temp 98.2 F (36.8 C) (Oral)  Resp 18   Ht 5\' 6"  (1.676 m)   Wt 90.3 kg   SpO2 100%   BMI 32.12 kg/m  Physical Exam Vitals and nursing note reviewed.  Constitutional:      General: She is not in acute distress.    Appearance: She is well-developed.  HENT:     Head: Atraumatic.  Eyes:     Extraocular Movements: Extraocular movements intact.     Conjunctiva/sclera: Conjunctivae normal.     Pupils: Pupils are equal, round, and reactive to light.  Cardiovascular:     Rate and Rhythm: Tachycardia present.     Pulses: Normal pulses.     Heart sounds: Normal heart sounds.   Pulmonary:     Effort: Pulmonary effort is normal.     Breath sounds: No wheezing, rhonchi or rales.  Abdominal:     Palpations: Abdomen is soft.     Tenderness: There is no abdominal tenderness.  Musculoskeletal:     Cervical back: Normal range of motion and neck supple. No rigidity or tenderness.  Skin:    Findings: No rash.  Neurological:     Mental Status: She is alert and oriented to person, place, and time.  Psychiatric:        Mood and Affect: Mood normal.     ED Results / Procedures / Treatments   Labs (all labs ordered are listed, but only abnormal results are displayed) Labs Reviewed  COMPREHENSIVE METABOLIC PANEL - Abnormal; Notable for the following components:      Result Value   Sodium 127 (*)    Chloride 87 (*)    CO2 20 (*)    Glucose, Bld 110 (*)    BUN 41 (*)    Creatinine, Ser 3.08 (*)    Calcium 8.3 (*)    Total Protein 8.2 (*)    AST 69 (*)    ALT 56 (*)    Total Bilirubin 1.4 (*)    GFR, Estimated 20 (*)    Anion gap 20 (*)    All other components within normal limits  URINALYSIS, ROUTINE W REFLEX MICROSCOPIC - Abnormal; Notable for the following components:   APPearance CLOUDY (*)    Hgb urine dipstick SMALL (*)    Ketones, ur 5 (*)    Protein, ur 100 (*)    Bacteria, UA RARE (*)    All other components within normal limits  LIPASE, BLOOD  CBC  I-STAT BETA HCG BLOOD, ED ([facility], [facility], [facility] ONLY)    EKG None ED ECG REPORT   Date: 05/15/2022  Rate: 101  Rhythm: sinus tachycardia  QRS Axis: left  Intervals: QT prolonged  ST/T Wave abnormalities: normal  Conduction Disutrbances:none  Narrative Interpretation:   Old EKG Reviewed: none available  I have personally reviewed the EKG tracing and agree with the computerized printout as noted.   Radiology No results found.  Procedures .Critical Care  Performed by: Fayrene Helper, PA-C Authorized by: Fayrene Helper, PA-C   Critical care provider statement:    Critical care time (minutes):   30   Critical care was time spent personally by me on the following activities:  Development of treatment plan with patient or surrogate, discussions with consultants, evaluation of patient's response to treatment, examination of patient, ordering and review of laboratory studies, ordering and review of radiographic studies, ordering and performing treatments and interventions, pulse oximetry, re-evaluation of patient's condition and review of old charts     Medications Ordered in ED Medications  ondansetron (  ZOFRAN) injection 4 mg (has no administration in time range)  sodium chloride 0.9 % bolus 1,000 mL (1,000 mLs Intravenous New Bag/Given 05/15/22 2106)  alum & mag hydroxide-simeth (MAALOX/MYLANTA) 200-200-20 MG/5ML suspension 30 mL (30 mLs Oral Given 05/15/22 2106)  aspirin-acetaminophen-caffeine (EXCEDRIN MIGRAINE) per tablet 1 tablet (1 tablet Oral Given 05/15/22 2154)    ED Course/ Medical Decision Making/ A&P                             Medical Decision Making Amount and/or Complexity of Data Reviewed Labs: ordered. ECG/medicine tests: ordered.  Risk OTC drugs. Prescription drug management.   BP 126/80 (BP Location: Left Arm)   Pulse (!) 120   Temp 98.2 F (36.8 C) (Oral)   Resp 18   Ht 5\' 6"  (1.676 m)   Wt 90.3 kg   SpO2 100%   BMI 32.12 kg/m   38:64 PM 37 year old female with significant history of pancreatitis, diabetes, hypertension, alcohol abuse, brought to via EMS with complaints of nausea vomiting.  Patient report for the past 3 to 4 days she has had persistent nausea and vomiting.  Vomiting is nonbloody nonbilious.  She has decreased bowel movement but able to pass flatus.  She is urinating fine.  She does not endorse any abdominal pain but does endorse some throbbing headache.  She feels her heart is racing whenever she moves around in bed.  No complaint of congestion sore throat sneezing or coughing no urinary symptoms no vaginal bleeding or vaginal discharge.  She  does admits to alcohol use last use was today.  She denies marijuana use or tobacco use.  She has had pancreatitis in the past but states that this felt different.  She also endorsed midsternal burning sensation for the past few days that felt similar to her heartburn.  She requesting for medication for that.  No recent sick contact.  On exam, patient is sitting in the chair in no acute discomfort.  Heart with tachycardia lungs are clear to auscultation bilaterally abdomen is soft nontender without any significant epigastric tenderness or left upper quadrant tenderness.  She has normal skin turgor.  Vital signs notable for tachycardia with heart rate of 120.  She is afebrile no hypoxia.  EMR reviewed patient does have history of alcoholic related pancreatitis however she does not have any significant reproducible abdominal tenderness to suggest acute pancreatitis at this time.  Her tachycardia is likely secondary to dehydration due to her nausea and vomiting.  SBO is on the differential but however my suspicion is low in the setting of no abdominal pain and her bowel sounds present.  -Labs ordered, independently viewed and interpreted by me.  Labs remarkable for hyponatremia with sodium of 127.  Hypochloremia with chloride of 87, bicarb is low at 20, patient has evidence of AKI with BUN 41, creatinine 3.08 which is markedly elevated compared to her baseline.  Mild transaminitis with AST 69, ALT 56, and total bili of 1.4.  Her GFR was 20, she has an anion gap of 20 however her blood sugar is essentially normal at 110.  Normal WBC, normal H&H, normal lipase. -The patient was maintained on a cardiac monitor.  I personally viewed and interpreted the cardiac monitored which showed an underlying rhythm of: Sinus tachycardia with prolonged QT -Imaging including head CT scan and abdominal CT scan was considered but not performed as patient without any focal neurodeficit concerning for stroke or space-occupying  lesion and no head trauma to suggest fracture.  She does not have any abdominal discomfort to suggest acute pancreatitis. -This patient presents to the ED for concern of n/v, this involves an extensive number of treatment options, and is a complaint that carries with it a high risk of complications and morbidity.  The differential diagnosis includes viral gastroenteritis, GERD, gastritis, pancreatitis, acute cholecystitis, colitis, pancreatitis, diverticular disease, UTI, ACS. -Co morbidities that complicate the patient evaluation includes alcohol use, diabetes -Treatment includes Excedrin, GI cocktail, IV fluid, Zofran -Reevaluation of the patient after these medicines showed that the patient improved -PCP office notes or outside notes reviewed -Discussion with specialist [facility] Dr. Criselda Peaches who agrees to admit pt -Escalation to admission/observation considered: patient is agreeable with hospital admission  Patient has quite a bit of her electrolyte derangements likely in the setting of lack of p.o. intake and associate nausea and vomiting.  Her sodium is 127 likely contributing to her ongoing headache.  She has prolonged QT on her EKG which is not new but will limit the amount of medication that she can received for her nausea.  She will continue to receive additional IV fluid.  Hospitalist was consulted and will admit patient for further care.        Final Clinical Impression(s) / ED Diagnoses Final diagnoses:  AKI (acute kidney injury) (HCC)  Hyponatremia  Intractable nausea and vomiting  Hypochloremia    Rx / DC Orders ED Discharge Orders     None         Fayrene Helper, PA-C 05/15/22 2222    Mardene Sayer, MD 05/15/22 2252

## 2022-05-15 NOTE — ED Triage Notes (Signed)
Patient brought in by [facility], reports n/v for the past 2-3 days. Denies abd pain. Received NS and 4mg  zofran pta from EMS. Also endorses headache and acid reflux.

## 2022-05-16 ENCOUNTER — Encounter (HOSPITAL_COMMUNITY): Payer: Self-pay | Admitting: Internal Medicine

## 2022-05-16 DIAGNOSIS — N179 Acute kidney failure, unspecified: Secondary | ICD-10-CM

## 2022-05-16 LAB — CBC
HCT: 33.7 % — ABNORMAL LOW (ref 36.0–46.0)
Hemoglobin: 11.8 g/dL — ABNORMAL LOW (ref 12.0–15.0)
MCH: 29 pg (ref 26.0–34.0)
MCHC: 35 g/dL (ref 30.0–36.0)
MCV: 82.8 fL (ref 80.0–100.0)
Platelets: 308 10*3/uL (ref 150–400)
RBC: 4.07 MIL/uL (ref 3.87–5.11)
RDW: 15 % (ref 11.5–15.5)
WBC: 6 10*3/uL (ref 4.0–10.5)
nRBC: 0 % (ref 0.0–0.2)

## 2022-05-16 LAB — CBG MONITORING, ED
Glucose-Capillary: 87 mg/dL (ref 70–99)
Glucose-Capillary: 100 mg/dL — ABNORMAL HIGH (ref 70–99)

## 2022-05-16 LAB — COMPREHENSIVE METABOLIC PANEL
ALT: 45 U/L — ABNORMAL HIGH (ref 0–44)
AST: 57 U/L — ABNORMAL HIGH (ref 15–41)
Albumin: 3.8 g/dL (ref 3.5–5.0)
Alkaline Phosphatase: 47 U/L (ref 38–126)
Anion gap: 18 — ABNORMAL HIGH (ref 5–15)
BUN: 31 mg/dL — ABNORMAL HIGH (ref 6–20)
CO2: 20 mmol/L — ABNORMAL LOW (ref 22–32)
Calcium: 7.6 mg/dL — ABNORMAL LOW (ref 8.9–10.3)
Chloride: 92 mmol/L — ABNORMAL LOW (ref 98–111)
Creatinine, Ser: 1.82 mg/dL — ABNORMAL HIGH (ref 0.44–1.00)
GFR, Estimated: 37 mL/min — ABNORMAL LOW (ref >60)
Glucose, Bld: 84 mg/dL (ref 70–99)
Potassium: 4.5 mmol/L (ref 3.5–5.1)
Sodium: 130 mmol/L — ABNORMAL LOW (ref 135–145)
Total Bilirubin: 1.6 mg/dL — ABNORMAL HIGH (ref 0.3–1.2)
Total Protein: 7.5 g/dL (ref 6.5–8.1)

## 2022-05-16 LAB — GLUCOSE, CAPILLARY
Glucose-Capillary: 85 mg/dL (ref 70–99)
Glucose-Capillary: 171 mg/dL — ABNORMAL HIGH (ref 70–99)

## 2022-05-16 LAB — MAGNESIUM: Magnesium: 1.8 mg/dL (ref 1.7–2.4)

## 2022-05-16 MED ORDER — LIDOCAINE VISCOUS HCL 2 % MT SOLN
15.0000 mL | Freq: Once | OROMUCOSAL | Status: AC
  Administered 2022-05-16: 15 mL via ORAL
  Filled 2022-05-16: qty 15

## 2022-05-16 MED ORDER — RIVAROXABAN 10 MG PO TABS
10.0000 mg | ORAL_TABLET | Freq: Every day | ORAL | Status: DC
  Filled 2022-05-16 (×3): qty 1

## 2022-05-16 MED ORDER — ALUM & MAG HYDROXIDE-SIMETH 200-200-20 MG/5ML PO SUSP
30.0000 mL | Freq: Once | ORAL | Status: AC
  Administered 2022-05-16: 30 mL via ORAL
  Filled 2022-05-16: qty 30

## 2022-05-16 MED ORDER — MAGNESIUM OXIDE -MG SUPPLEMENT 400 (240 MG) MG PO TABS
200.0000 mg | ORAL_TABLET | Freq: Every day | ORAL | Status: DC
  Administered 2022-05-16 – 2022-05-17 (×2): 200 mg via ORAL
  Filled 2022-05-16: qty 0.5
  Filled 2022-05-16: qty 1

## 2022-05-16 MED ORDER — ONDANSETRON HCL 4 MG/2ML IJ SOLN
INTRAMUSCULAR | Status: AC
  Filled 2022-05-16: qty 2

## 2022-05-16 MED ORDER — ADULT MULTIVITAMIN W/MINERALS CH
1.0000 | ORAL_TABLET | Freq: Every day | ORAL | Status: DC
  Administered 2022-05-16 – 2022-05-17 (×2): 1 via ORAL
  Filled 2022-05-16 (×2): qty 1

## 2022-05-16 MED ORDER — HYDROCODONE-ACETAMINOPHEN 5-325 MG PO TABS
1.0000 | ORAL_TABLET | Freq: Four times a day (QID) | ORAL | Status: AC | PRN
  Administered 2022-05-16: 1 via ORAL
  Filled 2022-05-16: qty 1

## 2022-05-16 MED ORDER — DICYCLOMINE HCL 10 MG/5ML PO SOLN
10.0000 mg | Freq: Once | ORAL | Status: AC
  Administered 2022-05-16: 10 mg via ORAL
  Filled 2022-05-16: qty 5

## 2022-05-16 MED ORDER — FENOFIBRATE 160 MG PO TABS
160.0000 mg | ORAL_TABLET | Freq: Every day | ORAL | Status: DC
  Administered 2022-05-16 – 2022-05-17 (×2): 160 mg via ORAL
  Filled 2022-05-16 (×3): qty 1

## 2022-05-16 MED ORDER — HYOSCYAMINE SULFATE 0.125 MG SL SUBL
0.2500 mg | SUBLINGUAL_TABLET | Freq: Once | SUBLINGUAL | Status: AC
  Administered 2022-05-16: 0.25 mg via SUBLINGUAL
  Filled 2022-05-16: qty 2

## 2022-05-16 MED ORDER — INSULIN GLARGINE-YFGN 100 UNIT/ML ~~LOC~~ SOLN
14.0000 [IU] | Freq: Every day | SUBCUTANEOUS | Status: DC
  Administered 2022-05-17: 14 [IU] via SUBCUTANEOUS
  Filled 2022-05-16 (×2): qty 0.14

## 2022-05-16 MED ORDER — ACETAMINOPHEN 650 MG RE SUPP: 650.0000 mg | Freq: Four times a day (QID) | RECTAL | Status: DC | PRN

## 2022-05-16 MED ORDER — INSULIN ASPART 100 UNIT/ML IJ SOLN
0.0000 [IU] | Freq: Three times a day (TID) | INTRAMUSCULAR | Status: DC
  Administered 2022-05-17: 2 [IU] via SUBCUTANEOUS
  Administered 2022-05-17: 1 [IU] via SUBCUTANEOUS
  Filled 2022-05-16: qty 0.09

## 2022-05-16 MED ORDER — SODIUM CHLORIDE 0.9% FLUSH
3.0000 mL | Freq: Two times a day (BID) | INTRAVENOUS | Status: DC
  Administered 2022-05-16 – 2022-05-17 (×3): 3 mL via INTRAVENOUS

## 2022-05-16 MED ORDER — HYDROCODONE-ACETAMINOPHEN 5-325 MG PO TABS
1.0000 | ORAL_TABLET | Freq: Four times a day (QID) | ORAL | Status: AC | PRN
  Administered 2022-05-16 (×2): 1 via ORAL
  Filled 2022-05-16 (×2): qty 1

## 2022-05-16 MED ORDER — SODIUM CHLORIDE 0.9 % IV SOLN: INTRAVENOUS | Status: AC

## 2022-05-16 MED ORDER — PANTOPRAZOLE SODIUM 40 MG PO TBEC
40.0000 mg | DELAYED_RELEASE_TABLET | Freq: Every morning | ORAL | Status: DC
  Administered 2022-05-16 – 2022-05-17 (×2): 40 mg via ORAL
  Filled 2022-05-16 (×2): qty 1

## 2022-05-16 MED ORDER — PROMETHAZINE HCL 25 MG PO TABS
12.5000 mg | ORAL_TABLET | Freq: Four times a day (QID) | ORAL | Status: DC | PRN
  Administered 2022-05-16 (×2): 12.5 mg via ORAL
  Filled 2022-05-16 (×2): qty 1

## 2022-05-16 MED ORDER — MORPHINE SULFATE (PF) 2 MG/ML IV SOLN: 2.0000 mg | INTRAVENOUS | Status: DC | PRN

## 2022-05-16 MED ORDER — ACETAMINOPHEN 325 MG PO TABS
650.0000 mg | ORAL_TABLET | Freq: Four times a day (QID) | ORAL | Status: DC | PRN
  Administered 2022-05-17: 650 mg via ORAL
  Filled 2022-05-16: qty 2

## 2022-05-16 NOTE — ED Notes (Signed)
Obtained vitals for this patient. Pt denies pain right now. Pt is laying in bed resting.

## 2022-05-16 NOTE — Plan of Care (Signed)
Received patient from ED. Awake and alert orientx4. Pain managed with PRN meds. C/O nausea  given  Phenergan with good relief noted. Up ad lib. Safety precautions maintained. Problem: Education: Goal: Knowledge of General Education information will improve Description: Including pain rating scale, medication(s)/side effects and non-pharmacologic comfort measures Outcome: Progressing   Problem: Health Behavior/Discharge Planning: Goal: Ability to manage health-related needs will improve Outcome: Progressing   Problem: Clinical Measurements: Goal: Ability to maintain clinical measurements within normal limits will improve Outcome: Progressing Goal: Will remain free from infection Outcome: Progressing Goal: Diagnostic test results will improve Outcome: Progressing Goal: Respiratory complications will improve Outcome: Progressing Goal: Cardiovascular complication will be avoided Outcome: Progressing   Problem: Activity: Goal: Risk for activity intolerance will decrease Outcome: Progressing   Problem: Nutrition: Goal: Adequate nutrition will be maintained Outcome: Progressing   Problem: Coping: Goal: Level of anxiety will decrease Outcome: Progressing   Problem: Elimination: Goal: Will not experience complications related to bowel motility Outcome: Progressing Goal: Will not experience complications related to urinary retention Outcome: Progressing   Problem: Pain Managment: Goal: General experience of comfort will improve Outcome: Progressing   Problem: Safety: Goal: Ability to remain free from injury will improve Outcome: Progressing   Problem: Skin Integrity: Goal: Risk for impaired skin integrity will decrease Outcome: Progressing

## 2022-05-16 NOTE — Progress Notes (Signed)
PROGRESS NOTE  DESARE DIERS  WUJ:[id number] DOB: 1985-05-11 DOA: 05/15/2022 PCP: Marcine Matar, MD   Brief Narrative: Patient is a 50 female with history of pancreatitis hypertriglyceridemia, hypertension, diabetes, alcohol abuse who presented with intractable vomiting, nausea.  Report of recent  binge drinking.  On presentation, she was hemodynamically stable.  Lab work showed creatinine of 3.08, baseline creatinine normal, elevated liver enzymes, lipase of 21.  EKG showed QTc of 533  Assessment & Plan:  Principal Problem:   AKI (acute kidney injury) (HCC) Active Problems:   Hypertension associated with diabetes (HCC)   Alcohol abuse   Type 2 diabetes mellitus with obesity (HCC)   Abnormal LFTs   Prolonged QT interval   Heartburn  AKI: Secondary to dehydration from intractable nausea/vomiting.  Kidney function significantly improving with IV fluids.  Continue to monitor.  Continue IV fluids for today  Intractable nausea/vomiting: Continue antiemetics, IV fluids  Chronic alcohol abuse: Continue thiamine ,folic acid.  Monitor for withdrawal.  TOC consulted for resources. Drinks hard liquor.  Hypertriglyceridemia: On fibrate, Lipitor  Hyponatremia: Likely secondary to chronic alcohol abuse.  Continue to monitor.  Improving  Hypertension: Takes lisinopril at home.  Currently on hold  Uncontrolled diabetes type 2: Currently on sliding scale insulin.  Takes insulin, metformin at home.  Recent A1c of 9.1 as per 02/19/2022.  Will consult diabetic coordinator  Prolonged QTc: QTc of 530, monitor EKG  Elevated LFTs: likely asscoaited  with chronic alcohol abuse.  Continue to monitor, mild  Diabetes mellitus: Currently on sliding scale.  Monitor blood sugars  GERD: Continue PPI  Obesity: BMI 32.1         DVT prophylaxis:rivaroxaban (XARELTO) tablet 10 mg Start: 05/16/22 1000 rivaroxaban (XARELTO) tablet 10 mg     Code Status: Full Code  Family Communication: Boyfriend  at bedside  Patient status:Inpatient  Patient is from :home  Anticipated discharge NF:AOZH  Estimated DC date:tomorrow   Consultants:None   Procedures:None  Antimicrobials:  Anti-infectives (From admission, onward)    None       Subjective: Patient seen and examined at bedside today.  She is feeling better than when she came.  Boyfriend at bedside.  Denies any abdomen pain, nausea or vomiting during evaluation.  Objective: Vitals:   05/16/22 0100 05/16/22 0321 05/16/22 0617 05/16/22 0818  BP: 108/79 120/82 123/87 118/81  Pulse: 99 95 98 83  Resp: 19 19 19 18   Temp:  98 F (36.7 C) 98 F (36.7 C) 98 F (36.7 C)  TempSrc:  Oral Oral Oral  SpO2: 97% 98% 99% 100%  Weight:      Height:        Intake/Output Summary (Last 24 hours) at 05/16/2022 0865 Last data filed at 05/15/2022 2019 Gross per 24 hour  Intake 500 ml  Output --  Net 500 ml   Filed Weights   05/15/22 2020  Weight: 90.3 kg    Examination:  General exam: Overall comfortable, not in distress,obese HEENT: PERRL Respiratory system:  no wheezes or crackles  Cardiovascular system: S1 & S2 heard, RRR.  Gastrointestinal system: Abdomen is nondistended, soft and nontender. Central nervous system: Alert and oriented Extremities: No edema, no clubbing ,no cyanosis Skin: No rashes, no ulcers,no icterus     Data Reviewed: I have personally reviewed following labs and imaging studies  CBC: Recent Labs  Lab 05/15/22 2027 05/16/22 0630  WBC 6.7 6.0  HGB 13.2 11.8*  HCT 38.2 33.7*  MCV 81.8 82.8  PLT 379  308   Basic Metabolic Panel: Recent Labs  Lab 05/15/22 2027 05/16/22 0000 05/16/22 0721  NA 127*  --  130*  K 3.9  --  4.5  CL 87*  --  92*  CO2 20*  --  20*  GLUCOSE 110*  --  84  BUN 41*  --  31*  CREATININE 3.08*  --  1.82*  CALCIUM 8.3*  --  7.6*  MG  --  1.8  --      No results found for this or any previous visit (from the past 240 hour(s)).   Radiology Studies: No results  found.  Scheduled Meds:  fenofibrate  160 mg Oral Daily   insulin aspart  0-9 Units Subcutaneous TID WC   insulin glargine-yfgn  14 Units Subcutaneous Daily   magnesium oxide  200 mg Oral Daily   multivitamin with minerals  1 tablet Oral Daily   pantoprazole  40 mg Oral q AM   rivaroxaban  10 mg Oral Daily   sodium chloride flush  3 mL Intravenous Q12H   Continuous Infusions:  sodium chloride 100 mL/hr at 05/16/22 0615     LOS: 1 day   Burnadette Pop, MD [facility] P5/03/2022, 8:33 AM

## 2022-05-16 NOTE — ED Notes (Signed)
Pt ambulatory to and from the bathroom with steady gait, pt continues to report 2/10 epigastric pain, states that she doesn't need anything for her pain at this time.

## 2022-05-16 NOTE — ED Notes (Signed)
ED TO INPATIENT HANDOFF REPORT  ED Nurse Name and Phone #: Ned Clines Name/Age/Gender Yolanda Flores 37 y.o. female Room/Bed: WA24/WA24  Code Status   Code Status: Full Code  Home/SNF/Other Home Patient oriented to: self, place, time, and situation Is this baseline? Yes   Triage Complete: Triage complete  Chief Complaint AKI (acute kidney injury) [facility] [N17.9]  Triage Note Patient brought in by [facility], reports n/v for the past 2-3 days. Denies abd pain. Received NS and 4mg  zofran pta from EMS. Also endorses headache and acid reflux.   Allergies Allergies  Allergen Reactions   Ibuprofen Nausea And Vomiting    Level of Care/Admitting Diagnosis ED Disposition     ED Disposition  Admit   Condition  --   Comment  Hospital Area: [facility] [[id number]]  Level of Care: Med-Surg [16]  May admit patient to [facility] or [facility] if equivalent level of care is available:: Yes  Covid Evaluation: Asymptomatic - no recent exposure (last 10 days) testing not required  Diagnosis: AKI (acute kidney injury) [facility]) [[id number]]  Admitting Physician: Inez Catalina [phone]  Attending Physician: Inez Catalina [phone]  Certification:: I certify this patient will need inpatient services for at least 2 midnights  Estimated Length of Stay: 2          B Medical/Surgery History Past Medical History:  Diagnosis Date   AKI (acute kidney injury) (HCC)    Diabetes mellitus (HCC)    HLD (hyperlipidemia)    Hypertension    Pancreatitis    Past Surgical History:  Procedure Laterality Date   FOOT SURGERY       A IV Location/Drains/Wounds Patient Lines/Drains/Airways Status     Active Line/Drains/Airways     Name Placement date Placement time Site Days   Peripheral IV 05/15/22 20 G Left Antecubital 05/15/22  2019  Antecubital  1            Intake/Output Last 24 hours  Intake/Output Summary (Last 24 hours) at 05/16/2022 1244 Last data filed  at 05/15/2022 2019 Gross per 24 hour  Intake 500 ml  Output --  Net 500 ml    Labs/Imaging Results for orders placed or performed during the hospital encounter of 05/15/22 (from the past 48 hour(s))  Lipase, blood     Status: None   Collection Time: 05/15/22  8:27 PM  Result Value Ref Range   Lipase 21 11 - 51 U/L    Comment: Performed at [facility], [address] [place], [place] [address]  Comprehensive metabolic panel     Status: Abnormal   Collection Time: 05/15/22  8:27 PM  Result Value Ref Range   Sodium 127 (L) 135 - 145 mmol/L   Potassium 3.9 3.5 - 5.1 mmol/L   Chloride 87 (L) 98 - 111 mmol/L   CO2 20 (L) 22 - 32 mmol/L   Glucose, Bld 110 (H) 70 - 99 mg/dL    Comment: Glucose reference range applies only to samples taken after fasting for at least 8 hours.   BUN 41 (H) 6 - 20 mg/dL   Creatinine, Ser 8.29 (H) 0.44 - 1.00 mg/dL   Calcium 8.3 (L) 8.9 - 10.3 mg/dL   Total Protein 8.2 (H) 6.5 - 8.1 g/dL   Albumin 4.3 3.5 - 5.0 g/dL   AST 69 (H) 15 - 41 U/L   ALT 56 (H) 0 - 44 U/L   Alkaline Phosphatase 59 38 - 126  U/L   Total Bilirubin 1.4 (H) 0.3 - 1.2 mg/dL   GFR, Estimated 20 (L) >60 mL/min    Comment: (NOTE) Calculated using the CKD-EPI Creatinine Equation (2021)    Anion gap 20 (H) 5 - 15    Comment: Performed at [facility], [address] [place], [place] [address]  CBC     Status: None   Collection Time: 05/15/22  8:27 PM  Result Value Ref Range   WBC 6.7 4.0 - 10.5 K/uL   RBC 4.67 3.87 - 5.11 MIL/uL   Hemoglobin 13.2 12.0 - 15.0 g/dL   HCT 62.1 30.8 - 65.7 %   MCV 81.8 80.0 - 100.0 fL   MCH 28.3 26.0 - 34.0 pg   MCHC 34.6 30.0 - 36.0 g/dL   RDW 84.6 96.2 - 95.2 %   Platelets 379 150 - 400 K/uL   nRBC 0.0 0.0 - 0.2 %    Comment: Performed at [facility], [address] [place], [place] [address]  I-Stat beta hCG blood, ED     Status: None   Collection Time: 05/15/22  8:34 PM  Result Value  Ref Range   I-stat hCG, quantitative <5.0 <5 mIU/mL   Comment 3            Comment:   GEST. AGE      CONC.  (mIU/mL)   <=1 WEEK        5 - 50     2 WEEKS       50 - 500     3 WEEKS       100 - 10,000     4 WEEKS     1,000 - 30,000        FEMALE AND NON-PREGNANT FEMALE:     LESS THAN 5 mIU/mL   Urinalysis, Routine w reflex microscopic -Urine, Clean Catch     Status: Abnormal   Collection Time: 05/15/22  9:20 PM  Result Value Ref Range   Color, Urine YELLOW YELLOW   APPearance CLOUDY (A) CLEAR   Specific Gravity, Urine 1.014 1.005 - 1.030   pH 5.0 5.0 - 8.0   Glucose, UA NEGATIVE NEGATIVE mg/dL   Hgb urine dipstick SMALL (A) NEGATIVE   Bilirubin Urine NEGATIVE NEGATIVE   Ketones, ur 5 (A) NEGATIVE mg/dL   Protein, ur 440 (A) NEGATIVE mg/dL   Nitrite NEGATIVE NEGATIVE   Leukocytes,Ua NEGATIVE NEGATIVE   RBC / HPF 0-5 0 - 5 RBC/hpf   WBC, UA 0-5 0 - 5 WBC/hpf   Bacteria, UA RARE (A) NONE SEEN   Squamous Epithelial / HPF 11-20 0 - 5 /HPF   Mucus PRESENT    Hyaline Casts, UA PRESENT     Comment: Performed at [facility], [address] [place], [place] [address]  Magnesium     Status: None   Collection Time: 05/16/22 12:00 AM  Result Value Ref Range   Magnesium 1.8 1.7 - 2.4 mg/dL    Comment: Performed at [facility], [address], [place], [place] [address]  CBC     Status: Abnormal   Collection Time: 05/16/22  6:30 AM  Result Value Ref Range   WBC 6.0 4.0 - 10.5 K/uL   RBC 4.07 3.87 - 5.11 MIL/uL   Hemoglobin 11.8 (L) 12.0 - 15.0 g/dL   HCT 40.3 (L) 47.4 - 25.9 %   MCV 82.8 80.0 - 100.0 fL   MCH 29.0 26.0 - 34.0  pg   MCHC 35.0 30.0 - 36.0 g/dL   RDW 16.1 09.6 - 04.5 %   Platelets 308 150 - 400 K/uL   nRBC 0.0 0.0 - 0.2 %    Comment: Performed at [facility], [address], [place], [place] [address]  Comprehensive metabolic panel     Status: Abnormal   Collection Time: 05/16/22  7:21 AM  Result Value Ref  Range   Sodium 130 (L) 135 - 145 mmol/L   Potassium 4.5 3.5 - 5.1 mmol/L   Chloride 92 (L) 98 - 111 mmol/L   CO2 20 (L) 22 - 32 mmol/L   Glucose, Bld 84 70 - 99 mg/dL    Comment: Glucose reference range applies only to samples taken after fasting for at least 8 hours.   BUN 31 (H) 6 - 20 mg/dL   Creatinine, Ser 1.91 (H) 0.44 - 1.00 mg/dL   Calcium 7.6 (L) 8.9 - 10.3 mg/dL   Total Protein 7.5 6.5 - 8.1 g/dL   Albumin 3.8 3.5 - 5.0 g/dL   AST 57 (H) 15 - 41 U/L   ALT 45 (H) 0 - 44 U/L   Alkaline Phosphatase 47 38 - 126 U/L   Total Bilirubin 1.6 (H) 0.3 - 1.2 mg/dL   GFR, Estimated 37 (L) >60 mL/min    Comment: (NOTE) Calculated using the CKD-EPI Creatinine Equation (2021)    Anion gap 18 (H) 5 - 15    Comment: Performed at [facility], [address], [place], [place] [address]  CBG monitoring, ED     Status: None   Collection Time: 05/16/22  7:52 AM  Result Value Ref Range   Glucose-Capillary 87 70 - 99 mg/dL    Comment: Glucose reference range applies only to samples taken after fasting for at least 8 hours.  CBG monitoring, ED     Status: Abnormal   Collection Time: 05/16/22 11:39 AM  Result Value Ref Range   Glucose-Capillary 100 (H) 70 - 99 mg/dL    Comment: Glucose reference range applies only to samples taken after fasting for at least 8 hours.   No results found.  Pending Labs Unresulted Labs (From admission, onward)    None       Vitals/Pain Today's Vitals   05/16/22 1211 05/16/22 1212 05/16/22 1212 05/16/22 1233  BP: 104/65  104/65 90/62  Pulse:  82  (!) 103  Resp:   18 16  Temp:   97.8 F (36.6 C) 98.1 F (36.7 C)  TempSrc:   Oral Oral  SpO2: 100%  98% 97%  Weight:      Height:      PainSc:        Isolation Precautions No active isolations  Medications Medications  fenofibrate tablet 160 mg (160 mg Oral Given 05/16/22 1026)  insulin glargine-yfgn (SEMGLEE) injection 14 Units (14 Units Subcutaneous Not Given 05/16/22 1028)   pantoprazole (PROTONIX) EC tablet 40 mg (40 mg Oral Given 05/16/22 0615)  magnesium oxide (MAG-OX) tablet 200 mg (200 mg Oral Given 05/16/22 1029)  multivitamin with minerals tablet 1 tablet (1 tablet Oral Given 05/16/22 1030)  insulin aspart (novoLOG) injection 0-9 Units ( Subcutaneous Not Given 05/16/22 1140)  rivaroxaban (XARELTO) tablet 10 mg (10 mg Oral Not Given 05/16/22 1118)  sodium chloride flush (NS) 0.9 % injection 3 mL (3 mLs Intravenous Given 05/16/22 1031)  0.9 %  sodium chloride infusion ( Intravenous New Bag/Given 05/16/22 1025)  acetaminophen (TYLENOL) tablet 650 mg (  has no administration in time range)    Or  acetaminophen (TYLENOL) suppository 650 mg (has no administration in time range)  morphine (PF) 2 MG/ML injection 2 mg (has no administration in time range)  promethazine (PHENERGAN) tablet 12.5 mg (12.5 mg Oral Given 05/16/22 0535)  HYDROcodone-acetaminophen (NORCO/VICODIN) 5-325 MG per tablet 1 tablet (1 tablet Oral Given 05/16/22 0606)  sodium chloride 0.9 % bolus 1,000 mL (0 mLs Intravenous Stopped 05/16/22 0000)  alum & mag hydroxide-simeth (MAALOX/MYLANTA) 200-200-20 MG/5ML suspension 30 mL (30 mLs Oral Given 05/15/22 2106)  aspirin-acetaminophen-caffeine (EXCEDRIN MIGRAINE) per tablet 1 tablet (1 tablet Oral Given 05/15/22 2154)  ondansetron (ZOFRAN) injection 4 mg ( Intravenous Canceled Entry 05/16/22 0041)  sodium chloride 0.9 % bolus 1,000 mL (0 mLs Intravenous Stopped 05/16/22 0132)  alum & mag hydroxide-simeth (MAALOX/MYLANTA) 200-200-20 MG/5ML suspension 30 mL (30 mLs Oral Given 05/16/22 0030)  hyoscyamine (LEVSIN SL) SL tablet 0.25 mg (0.25 mg Sublingual Given 05/16/22 0030)  dicyclomine (BENTYL) 10 MG/5ML solution 10 mg (10 mg Oral Given 05/16/22 0030)  dicyclomine (BENTYL) 10 MG/5ML solution 10 mg (10 mg Oral Given 05/16/22 0608)  alum & mag hydroxide-simeth (MAALOX/MYLANTA) 200-200-20 MG/5ML suspension 30 mL (30 mLs Oral Given 05/16/22 0605)    And  lidocaine (XYLOCAINE) 2 % viscous mouth  solution 15 mL (15 mLs Oral Given 05/16/22 1610)    Mobility walks        R Recommendations: See Admitting Provider Note  Report given to:   Additional Notes:

## 2022-05-16 NOTE — Inpatient Diabetes Management (Signed)
Inpatient Diabetes Program Recommendations  AACE/ADA: New Consensus Statement on Inpatient Glycemic Control (2015)  Target Ranges:  Prepandial:   less than 140 mg/dL      Peak postprandial:   less than 180 mg/dL (1-2 hours)      Critically ill patients:  140 - 180 mg/dL   Lab Results  Component Value Date   GLUCAP 100 (H) 05/16/2022   HGBA1C 9.1 (H) 03/01/2022    Review of Glycemic Control  Diabetes history: DM Outpatient Diabetes medications: Basaglar 14 QD, metformin 1000 mg BID Current orders for Inpatient glycemic control: Semglee 14 units QD, Novolog 0-9 units TID with meals   CBGs 87, 100 mg/dL PCP - Dr Laural Benes - has upcoming appt  Inpatient Diabetes Program Recommendations:    Agree with orders.  Spoke with pt at bedside regarding her diabetes and HgbA1C of 9.1%. Pt states she takes insulin and meds as prescribed and monitors blood sugars 3x/day. Has been eating healthier and adding more fiber to her diet. Pt states her PCP is going to refer her to an Endocrinologist. Asked her if she was interested in getting a CGM and she said she's thought about it but doesn't think she would like it on her arm. States she has no problems with sticking finger for CBGs. Pt is CNA at [employer].  Discussed glucose and A1C goals.  Explained how hyperglycemia leads to damage within blood vessels which lead to the common complications seen with uncontrolled diabetes. Stressed to the patient the importance of improving glycemic control to prevent further complications from uncontrolled diabetes. Discussed impact of nutrition, exercise, stress, sickness, and medications on diabetes control. Answered all questions and pt appreciative of visit.   Continue to follow.  Thank you. Ailene Ards, RD, LDN, CDCES Inpatient Diabetes Coordinator [phone]

## 2022-05-17 DIAGNOSIS — N179 Acute kidney failure, unspecified: Secondary | ICD-10-CM | POA: Diagnosis not present

## 2022-05-17 LAB — GLUCOSE, CAPILLARY
Glucose-Capillary: 125 mg/dL — ABNORMAL HIGH (ref 70–99)
Glucose-Capillary: 175 mg/dL — ABNORMAL HIGH (ref 70–99)

## 2022-05-17 LAB — BASIC METABOLIC PANEL
Anion gap: 10 (ref 5–15)
BUN: 20 mg/dL (ref 6–20)
CO2: 30 mmol/L (ref 22–32)
Calcium: 8.5 mg/dL — ABNORMAL LOW (ref 8.9–10.3)
Chloride: 92 mmol/L — ABNORMAL LOW (ref 98–111)
Creatinine, Ser: 1.16 mg/dL — ABNORMAL HIGH (ref 0.44–1.00)
GFR, Estimated: >60 mL/min (ref >60)
Glucose, Bld: 161 mg/dL — ABNORMAL HIGH (ref 70–99)
Potassium: 4.1 mmol/L (ref 3.5–5.1)
Sodium: 132 mmol/L — ABNORMAL LOW (ref 135–145)

## 2022-05-17 MED ORDER — THIAMINE HCL 100 MG PO TABS: 100.0000 mg | ORAL_TABLET | Freq: Every day | ORAL | 1 refills | Status: DC

## 2022-05-17 MED ORDER — ALUM & MAG HYDROXIDE-SIMETH 200-200-20 MG/5ML PO SUSP
30.0000 mL | Freq: Four times a day (QID) | ORAL | Status: DC | PRN
  Administered 2022-05-17: 30 mL via ORAL
  Filled 2022-05-17: qty 30

## 2022-05-17 MED ORDER — FOLIC ACID 1 MG PO TABS: 1.0000 mg | ORAL_TABLET | Freq: Every day | ORAL | 1 refills | Status: AC

## 2022-05-17 MED ORDER — PANTOPRAZOLE SODIUM 40 MG PO TBEC: 40.0000 mg | DELAYED_RELEASE_TABLET | Freq: Every morning | ORAL | 0 refills | Status: DC

## 2022-05-17 NOTE — TOC Initial Note (Signed)
Transition of Care [facility]) - Initial/Assessment Note    Patient Details  Name: Yolanda Flores MRN: [id number] Date of Birth: 1985/03/03  Transition of Care [facility]) CM/SW Contact:    Adrian Prows, RN Phone Number: 05/17/2022, 11:48 AM  Clinical Narrative:                 [facility] consult for SA counseling/education; spoke w/ pt in room; she agrees to receiving resources; Resource Guide for Outpatient and Inpatient Counseling/Substance Abuse placed in d/c instructions; copies of resources for  given to pt;  she will make appointment with agency of choice; no TOC needs.  Expected Discharge Plan: Home/Self Care Barriers to Discharge: No Barriers Identified   Patient Goals and CMS Choice Patient states their goals for this hospitalization and ongoing recovery are:: home          Expected Discharge Plan and Services   Discharge Planning Services: CM Consult Post Acute Care Choice: NA Living arrangements for the past 2 months: Apartment Expected Discharge Date: 05/17/22               DME Arranged:  (n/a)         HH Arranged: NA          Prior Living Arrangements/Services Living arrangements for the past 2 months: Apartment Lives with:: Significant Other Patient language and need for interpreter reviewed:: Yes Do you feel safe going back to the place where you live?: Yes      Need for Family Participation in Patient Care: Yes (Comment) Care giver support system in place?: Yes (comment) Current home services:  (n/a) Criminal Activity/Legal Involvement Pertinent to Current Situation/Hospitalization: No - Comment as needed  Activities of Daily Living Home Assistive Devices/Equipment: None ADL Screening (condition at time of admission) Patient's cognitive ability adequate to safely complete daily activities?: Yes Is the patient deaf or have difficulty hearing?: No Does the patient have difficulty seeing, even when wearing glasses/contacts?: No Does the patient have  difficulty concentrating, remembering, or making decisions?: No Patient able to express need for assistance with ADLs?: Yes Does the patient have difficulty dressing or bathing?: No Independently performs ADLs?: Yes (appropriate for developmental age) Does the patient have difficulty walking or climbing stairs?: No Weakness of Legs: None Weakness of Arms/Hands: None  Permission Sought/Granted Permission sought to share information with : Case Manager Permission granted to share information with : Yes, Verbal Permission Granted  Share Information with NAME: Burnard Bunting, RN, CM     Permission granted to share info w Relationship: Virginia Rochester (mother) [phone]     Emotional Assessment Appearance:: Appears stated age Attitude/Demeanor/Rapport: Gracious Affect (typically observed): Accepting Orientation: : Oriented to Self, Oriented to Place, Oriented to  Time, Oriented to Situation Alcohol / Substance Use: Alcohol Use Psych Involvement: No (comment)  Admission diagnosis:  Hypochloremia [E87.8] Hyponatremia [E87.1] AKI (acute kidney injury) (HCC) [N17.9] Intractable nausea and vomiting [R11.2] Patient Active Problem List   Diagnosis Date Noted   High anion gap metabolic acidosis 03/01/2022   Insulin dependent type 2 diabetes mellitus (HCC) 03/01/2022   Hyperkalemia 03/01/2022   Hypomagnesemia 03/01/2022   Metabolic acidosis, increased anion gap (IAG) 01/03/2022   DKA (diabetic ketoacidosis) (HCC) 12/05/2021   Class 1 obesity 12/05/2021   AKI (acute kidney injury) (HCC) 12/05/2021   Former smoker 02/26/2021   Type 2 diabetes mellitus with obesity (HCC) 02/26/2021   Macrocytic anemia 12/27/2019   Normocytic anemia 01/17/2019   Thrombocytosis 01/17/2019   Heartburn  Alcohol induced acute pancreatitis 01/04/2019   Hypertension associated with diabetes (HCC) 05/24/2018   Metabolic acidosis 05/24/2018   Alcohol abuse 05/24/2018   Abnormal LFTs 05/24/2018   Prolonged  QT interval    PCP:  Marcine Matar, MD Pharmacy:   [facility] [facility] [address] - [place], [place] - [address] [facility] [address] [facility] [place] [address] Phone: [phone] Fax: [phone]  [id number] - [place], [place] - [address] AT [facility] [address] [place] [address] Phone: [phone] Fax: [phone]     Social Determinants of Health (SDOH) Social History: SDOH Screenings   Food Insecurity: No Food Insecurity (05/17/2022)  Housing: Low Risk  (05/17/2022)  Transportation Needs: No Transportation Needs (05/17/2022)  Utilities: Not At Risk (05/17/2022)  Depression (PHQ2-9): Low Risk  (04/02/2022)  Tobacco Use: Medium Risk (05/16/2022)   SDOH Interventions: Food Insecurity Interventions: Inpatient TOC Housing Interventions: Inpatient TOC Transportation Interventions: Inpatient TOC Utilities Interventions: Inpatient TOC   Readmission Risk Interventions    05/17/2022   11:40 AM  Readmission Risk Prevention Plan  Transportation Screening Complete  PCP or Specialist Appt within 3-5 Days Complete  HRI or Home Care Consult Complete  Social Work Consult for Recovery Care Planning/Counseling Complete  Palliative Care Screening Not Applicable  Medication Review Oceanographer) Complete

## 2022-05-17 NOTE — Plan of Care (Signed)
Patient is awake and alert, orient x4. Currently denies pain. Remains on RA. Spouse at bedside. Up ad lib. Patient shows no signs and symptoms of acute distress at this time. Patient stable for discharge. Patient verbalized understanding of discharge instructions. Resources for alcohol abuse treatment provided. Problem: Education: Goal: Knowledge of General Education information will improve Description: Including pain rating scale, medication(s)/side effects and non-pharmacologic comfort measures Outcome: Adequate for Discharge   Problem: Health Behavior/Discharge Planning: Goal: Ability to manage health-related needs will improve Outcome: Adequate for Discharge   Problem: Clinical Measurements: Goal: Ability to maintain clinical measurements within normal limits will improve Outcome: Adequate for Discharge Goal: Will remain free from infection Outcome: Adequate for Discharge Goal: Diagnostic test results will improve Outcome: Adequate for Discharge Goal: Respiratory complications will improve Outcome: Adequate for Discharge Goal: Cardiovascular complication will be avoided Outcome: Adequate for Discharge   Problem: Activity: Goal: Risk for activity intolerance will decrease Outcome: Adequate for Discharge   Problem: Nutrition: Goal: Adequate nutrition will be maintained Outcome: Adequate for Discharge   Problem: Coping: Goal: Level of anxiety will decrease Outcome: Adequate for Discharge   Problem: Elimination: Goal: Will not experience complications related to bowel motility Outcome: Adequate for Discharge Goal: Will not experience complications related to urinary retention Outcome: Adequate for Discharge   Problem: Pain Managment: Goal: General experience of comfort will improve Outcome: Adequate for Discharge   Problem: Safety: Goal: Ability to remain free from injury will improve Outcome: Adequate for Discharge   Problem: Skin Integrity: Goal: Risk for impaired  skin integrity will decrease Outcome: Adequate for Discharge

## 2022-05-17 NOTE — Discharge Summary (Signed)
Physician Discharge Summary  Yolanda Flores NWG:[id number] DOB: December 31, 1985 DOA: 05/15/2022  PCP: Marcine Matar, MD  Admit date: 05/15/2022 Discharge date: 05/17/2022  Admitted From: Home Disposition:  Home  Discharge Condition:Stable CODE STATUS:FULL Diet recommendation: Carb Modified   Brief/Interim Summary: Patient is a 63 female with history of pancreatitis hypertriglyceridemia, hypertension, diabetes, alcohol abuse who presented with intractable vomiting, nausea.  Report of recent  binge drinking.  On presentation, she was hemodynamically stable.  Lab work showed creatinine of 3.08, baseline creatinine normal, elevated liver enzymes, lipase of 21.  EKG showed QTc of 533 .  Patient was started on IV fluids.  Has significantly improved clinically.  No nausea or vomiting today.  Kidney function has almost normalized.  Follow-up EKG showed QTc of 459.  She has been counseled to stop drinking.  Medically stable for discharge home today  Following problems were addressed during the hospitalization:  AKI: Secondary to dehydration from intractable nausea/vomiting.  Kidney function significantly improved with iv fluid   Intractable nausea/vomiting: resolved   Chronic alcohol abuse: Continue thiamine ,folic acid. TOC consulted for resources. Drinks hard liquor.   Hypertriglyceridemia: On fibrate, Lipitor   Hyponatremia: Likely secondary to chronic alcohol abuse.  Improved   Hypertension: Takes lisinopril at home. BP soft so stopped   Uncontrolled diabetes type 2: Takes insulin, metformin at home.  Recent A1c of 9.1 as per 02/19/2022.  Consulted diabetic coordinator.  Follow-up closely with PCP and monitor blood sugars at home   Prolonged QTc: QTc of 530, follow-up QTc is 459 today  Elevated LFTs: likely asscoaited  with chronic alcohol abuse.  Continue to monitor as outpatient, mild    GERD: Continue PPI   Obesity: BMI 32.1  Discharge Diagnoses:  Principal Problem:   AKI (acute  kidney injury) (HCC) Active Problems:   Hypertension associated with diabetes (HCC)   Alcohol abuse   Type 2 diabetes mellitus with obesity (HCC)   Abnormal LFTs   Prolonged QT interval   Heartburn    Discharge Instructions  Discharge Instructions     Diet Carb Modified   Complete by: As directed    Discharge instructions   Complete by: As directed    1)Please take prescribed medications as instructed 2)Follow up with your PCP in a week.  Monitor blood sugars at home 3)Quit alcohol   Increase activity slowly   Complete by: As directed       Allergies as of 05/17/2022       Reactions   Ibuprofen Nausea And Vomiting        Medication List     STOP taking these medications    lisinopril 5 MG tablet Commonly known as: ZESTRIL   omeprazole 20 MG tablet Commonly known as: PriLOSEC OTC       TAKE these medications    aspirin-acetaminophen-caffeine 250-250-65 MG tablet Commonly known as: EXCEDRIN MIGRAINE Take 1-2 tablets by mouth every 6 (six) hours as needed for headache.   atorvastatin 40 MG tablet Commonly known as: LIPITOR Take 1 tablet (40 mg total) by mouth daily.   Basaglar Tempo Pen 100 UNIT/ML Sopn Generic drug: Insulin Glargine w/ Trans Port Inject 14 Units into the skin daily.   fenofibrate 160 MG tablet Take 1 tablet (160 mg total) by mouth daily.   folic acid 1 MG tablet Commonly known as: FOLVITE Take 1 tablet (1 mg total) by mouth daily.   Magnesium 100 MG Caps Take 1 capsule by mouth daily.   metFORMIN 1000 MG  tablet Commonly known as: GLUCOPHAGE Take 1 tablet (1,000 mg total) by mouth 2 (two) times daily with a meal.   multivitamin with minerals Tabs tablet Take 1 tablet by mouth daily.   ONE TOUCH ULTRA 2 w/Device Kit UAD   OneTouch Ultra test strip Generic drug: glucose blood Use as instructed.   onetouch ultrasoft lancets Use as instructed   pantoprazole 40 MG tablet Commonly known as: PROTONIX Take 1 tablet (40 mg  total) by mouth in the morning for 14 days. Start taking on: May 18, 2022   thiamine 100 MG tablet Commonly known as: VITAMIN B1 Take 1 tablet (100 mg total) by mouth daily.        Follow-up Information     Marcine Matar, MD. Schedule an appointment as soon as possible for a visit in 1 week(s).   Specialty: Internal Medicine Contact information: [address] [place] [address] [phone]                Allergies  Allergen Reactions   Ibuprofen Nausea And Vomiting    Consultations: None   Procedures/Studies: No results found.    Subjective: Patient seen and examined at bedside today.  Hemodynamically stable.  Comfortable.  Nausea, vomiting have significantly improved today.  Medically stable for discharge home  Discharge Exam: Vitals:   05/16/22 2310 05/17/22 0440  BP: 118/87 109/84  Pulse: 95 79  Resp: 17 19  Temp: 98.4 F (36.9 C) 97.8 F (36.6 C)  SpO2: 100% 98%   Vitals:   05/16/22 1425 05/16/22 1808 05/16/22 2310 05/17/22 0440  BP: (!) 129/95 119/67 118/87 109/84  Pulse: 92 81 95 79  Resp: 19 16 17 19   Temp: 98.5 F (36.9 C) 98.6 F (37 C) 98.4 F (36.9 C) 97.8 F (36.6 C)  TempSrc: Oral  Oral Oral  SpO2: 100% 100% 100% 98%  Weight:      Height:        General: Pt is alert, awake, not in acute distress Cardiovascular: RRR, S1/S2 +, no rubs, no gallops Respiratory: CTA bilaterally, no wheezing, no rhonchi Abdominal: Soft, NT, ND, bowel sounds + Extremities: no edema, no cyanosis    The results of significant diagnostics from this hospitalization (including imaging, microbiology, ancillary and laboratory) are listed below for reference.     Microbiology: No results found for this or any previous visit (from the past 240 hour(s)).   Labs: BNP (last 3 results) Recent Labs    02/28/22 2033  BNP 6.5   Basic Metabolic Panel: Recent Labs  Lab 05/15/22 2027 05/16/22 0000 05/16/22 0721 05/17/22 0455   NA 127*  --  130* 132*  K 3.9  --  4.5 4.1  CL 87*  --  92* 92*  CO2 20*  --  20* 30  GLUCOSE 110*  --  84 161*  BUN 41*  --  31* 20  CREATININE 3.08*  --  1.82* 1.16*  CALCIUM 8.3*  --  7.6* 8.5*  MG  --  1.8  --   --    Liver Function Tests: Recent Labs  Lab 05/15/22 2027 05/16/22 0721  AST 69* 57*  ALT 56* 45*  ALKPHOS 59 47  BILITOT 1.4* 1.6*  PROT 8.2* 7.5  ALBUMIN 4.3 3.8   Recent Labs  Lab 05/15/22 2027  LIPASE 21   No results for input(s): "AMMONIA" in the last 168 hours. CBC: Recent Labs  Lab 05/15/22 2027 05/16/22 0630  WBC 6.7 6.0  HGB 13.2 11.8*  HCT 38.2 33.7*  MCV 81.8 82.8  PLT 379 308   Cardiac Enzymes: No results for input(s): "CKTOTAL", "CKMB", "CKMBINDEX", "TROPONINI" in the last 168 hours. BNP: Invalid input(s): "POCBNP" CBG: Recent Labs  Lab 05/16/22 0752 05/16/22 1139 05/16/22 1650 05/16/22 2118 05/17/22 0736  GLUCAP 87 100* 85 171* 125*   D-Dimer No results for input(s): "DDIMER" in the last 72 hours. Hgb A1c No results for input(s): "HGBA1C" in the last 72 hours. Lipid Profile No results for input(s): "CHOL", "HDL", "LDLCALC", "TRIG", "CHOLHDL", "LDLDIRECT" in the last 72 hours. Thyroid function studies No results for input(s): "TSH", "T4TOTAL", "T3FREE", "THYROIDAB" in the last 72 hours.  Invalid input(s): "FREET3" Anemia work up No results for input(s): "VITAMINB12", "FOLATE", "FERRITIN", "TIBC", "IRON", "RETICCTPCT" in the last 72 hours. Urinalysis    Component Value Date/Time   COLORURINE YELLOW 05/15/2022 2120   APPEARANCEUR CLOUDY (A) 05/15/2022 2120   LABSPEC 1.014 05/15/2022 2120   PHURINE 5.0 05/15/2022 2120   GLUCOSEU NEGATIVE 05/15/2022 2120   HGBUR SMALL (A) 05/15/2022 2120   BILIRUBINUR NEGATIVE 05/15/2022 2120   KETONESUR 5 (A) 05/15/2022 2120   PROTEINUR 100 (A) 05/15/2022 2120   UROBILINOGEN 2.0 (H) 12/31/2009 1757   NITRITE NEGATIVE 05/15/2022 2120   LEUKOCYTESUR NEGATIVE 05/15/2022 2120    Sepsis Labs Recent Labs  Lab 05/15/22 2027 05/16/22 0630  WBC 6.7 6.0   Microbiology No results found for this or any previous visit (from the past 240 hour(s)).  Please note: You were cared for by a hospitalist during your hospital stay. Once you are discharged, your primary care physician will handle any further medical issues. Please note that NO REFILLS for any discharge medications will be authorized once you are discharged, as it is imperative that you return to your primary care physician (or establish a relationship with a primary care physician if you do not have one) for your post hospital discharge needs so that they can reassess your need for medications and monitor your lab values.    Time coordinating discharge: 40 minutes  SIGNED:   Burnadette Pop, MD  [facility] 05/17/2022, 10:56 AM Pager [phone]  If 7PM-7AM, please contact night-coverage [email or web address] Password [email or web address]

## 2022-05-19 ENCOUNTER — Telehealth: Payer: Self-pay

## 2022-05-19 NOTE — Transitions of Care (Post Inpatient/ED Visit) (Signed)
   05/19/2022  Name: SILVA SCHOPP MRN: [id number] DOB: 03-23-1985  Today's TOC FU Call Status: Today's TOC FU Call Status:: Unsuccessul Call (1st Attempt) Unsuccessful Call (1st Attempt) Date: 05/19/22  Attempted to reach the patient regarding the most recent Inpatient/ED visit.  Follow Up Plan: Additional outreach attempts will be made to reach the patient to complete the Transitions of Care (Post Inpatient/ED visit) call.   Signature  Robyne Peers, RN

## 2022-05-20 ENCOUNTER — Telehealth: Payer: Self-pay

## 2022-05-20 NOTE — Transitions of Care (Post Inpatient/ED Visit) (Signed)
   05/20/2022  Name: Yolanda Flores MRN: [id number] DOB: 1985-11-12  Today's TOC FU Call Status: Today's TOC FU Call Status:: Unsuccessful Call (2nd Attempt) Unsuccessful Call (1st Attempt) Date: 05/19/22 Unsuccessful Call (2nd Attempt) Date: 05/20/22  Attempted to reach the patient regarding the most recent Inpatient/ED visit.  Follow Up Plan: Additional outreach attempts will be made to reach the patient to complete the Transitions of Care (Post Inpatient/ED visit) call.   Signature  Robyne Peers, RN

## 2022-05-21 ENCOUNTER — Telehealth: Payer: Self-pay

## 2022-05-21 NOTE — Transitions of Care (Post Inpatient/ED Visit) (Signed)
   05/21/2022  Name: Yolanda Flores MRN: [id number] DOB: 12-12-1985  Today's TOC FU Call Status: Today's TOC FU Call Status:: Unsuccessful Call (3rd Attempt) Unsuccessful Call (1st Attempt) Date: 05/19/22 Unsuccessful Call (2nd Attempt) Date: 05/20/22 Unsuccessful Call (3rd Attempt) Date: 05/21/22  Attempted to reach the patient regarding the most recent Inpatient/ED visit.  Follow Up Plan: No further outreach attempts will be made at this time. We have been unable to contact the patient.  The patient has an appointment at [facility] with Dr Laural Benes  05/23/2022.   Signature Robyne Peers, RN

## 2022-05-23 ENCOUNTER — Ambulatory Visit: Payer: Commercial Managed Care - HMO | Admitting: Internal Medicine

## 2022-07-01 ENCOUNTER — Ambulatory Visit: Payer: Commercial Managed Care - HMO | Admitting: Pharmacist

## 2022-07-01 NOTE — Progress Notes (Deleted)
S:     No chief complaint on file.  37 y.o. female who presents for diabetes evaluation, education, and management.  PMH is significant for T2DM, hx DKA, HTN, hx AKI, alcohol induced pancreatitis.  Patient was referred and last seen by Primary Care Provider, Georgian Co, on 04/02/22. At last visit, blood sugars > 200s. Increased basaglar from 10 to 14 units daily. Restarted metformin 500 mg BID, if pt tolerated could increase to 1000 mg BID.  Today, patient arrives in *** good spirits and presents without *** any assistance. ***  Patient reports Diabetes was diagnosed in ***.   Family/Social History: ***  Current diabetes medications include: Metformin 1000 mg BID, basaglar 14 units daily,  Current hypertension medications include: lisinopril 2.5 mg daily Current hyperlipidemia medications include: atorvastatin 40 mg daily  Patient reports adherence to taking all medications as prescribed.  *** Patient denies adherence with medications, reports missing *** medications *** times per week, on average.  Do you feel that your medications are working for you? {YES NO:22349} Have you been experiencing any side effects to the medications prescribed? {YES NO:22349} Do you have any problems obtaining medications due to transportation or finances? {YES [id number] Insurance coverage: ***  Patient {Actions; denies-reports:120008} hypoglycemic events.  Reported home fasting blood sugars: ***  Reported 2 hour post-meal/random blood sugars: ***.  Patient {Actions; denies-reports:120008} nocturia (nighttime urination).  Patient {Actions; denies-reports:120008} neuropathy (nerve pain). Patient {Actions; denies-reports:120008} visual changes. Patient {Actions; denies-reports:120008} self foot exams.   Patient reported dietary habits: Eats *** meals/day Breakfast: *** Lunch: *** Dinner: *** Snacks: *** Drinks: ***  Within the past 12 months, did you worry whether your food would run  out before you got money to buy more? {YES NO:22349} Within the past 12 months, did the food you bought run out, and you didn't have money to get more? {YES NO:22349} PHQ-9 Score: ***  Patient-reported exercise habits: ***   O:   ROS  Physical Exam  7 day average blood glucose: ***  *** CGM Download:  % Time CGM is active: ***% Average Glucose: *** mg/dL Glucose Management Indicator: ***  Glucose Variability: *** (goal <36%) Time in Goal:  - Time in range 70-180: ***% - Time above range: ***% - Time below range: ***% Observed patterns:   Lab Results  Component Value Date   HGBA1C 9.1 (H) 03/01/2022   There were no vitals filed for this visit.  Lipid Panel     Component Value Date/Time   CHOL 355 (H) 03/01/2022 0133   TRIG 504 (H) 03/01/2022 0133   HDL 80 03/01/2022 0133   CHOLHDL 4.4 03/01/2022 0133   VLDL UNABLE TO CALCULATE IF TRIGLYCERIDE OVER 400 mg/dL 16/10/9602 5409   LDLCALC UNABLE TO CALCULATE IF TRIGLYCERIDE OVER 400 mg/dL 81/19/1478 2956   LDLDIRECT 189 (H) 03/01/2022 0133    Clinical Atherosclerotic Cardiovascular Disease (ASCVD): {YES/NO:21197} The ASCVD Risk score (Arnett DK, et al., 2019) failed to calculate for the following reasons:   The 2019 ASCVD risk score is only valid for ages 20 to 22   Patient is participating in a Managed Medicaid Plan:  {MM YES/NO:27447::"Yes"}   A/P: Diabetes longstanding *** currently ***. Patient is *** able to verbalize appropriate hypoglycemia management plan. Medication adherence appears ***. Control is suboptimal due to ***. -{Meds adjust:18428} basal insulin *** Lantus/Basaglar/Semglee (insulin glargine) *** Tresiba (insulin degludec) from *** units to *** units daily in the morning. Patient will continue to titrate 1 unit every *** days if  fasting blood sugar > 100mg /dl until fasting blood sugars reach goal or next visit.  -{Meds adjust:18428} rapid insulin *** Novolog (insulin aspart) *** Humalog (insulin  lispro) from *** to ***.  -{Meds adjust:18428} GLP-1 *** Trulicity (dulaglutide) *** Ozempic (semaglutide) *** Mounjaro (tirzepatide) from *** mg to *** mg .  -{Meds adjust:18428} SGLT2-I *** Farxiga (dapagliflozin) *** Jardiance (empagliflozin) 10 mg. Counseled on sick day rules. -{Meds adjust:18428} metformin ***.  -Patient educated on purpose, proper use, and potential adverse effects of ***.  -Extensively discussed pathophysiology of diabetes, recommended lifestyle interventions, dietary effects on blood sugar control.  -Counseled on s/sx of and management of hypoglycemia.  -Next A1c anticipated ***.   ASCVD risk - primary ***secondary prevention in patient with diabetes. Last LDL is *** not at goal of <08 *** mg/dL. ASCVD risk factors include *** and 10-year ASCVD risk score of ***. {Desc; low/moderate/high:110033} intensity statin indicated.  -{Meds adjust:18428} ***statin *** mg.   Hypertension longstanding *** currently ***. Blood pressure goal of <130/80 *** mmHg. Medication adherence ***. Blood pressure control is suboptimal due to ***. -{Meds adjust:18428} *** mg.  Written patient instructions provided. Patient verbalized understanding of treatment plan.  Total time in face to face counseling *** minutes.    Follow-up:  Pharmacist ***. PCP clinic visit in ***.  Patient seen with ***

## 2022-07-16 ENCOUNTER — Ambulatory Visit: Payer: Commercial Managed Care - HMO | Attending: Internal Medicine | Admitting: Physician Assistant

## 2022-07-16 ENCOUNTER — Encounter: Payer: Self-pay | Admitting: Physician Assistant

## 2022-07-16 VITALS — BP 112/62 | HR 92 | Temp 98.4°F | Ht 66.0 in | Wt 206.0 lb

## 2022-07-16 DIAGNOSIS — R1013 Epigastric pain: Secondary | ICD-10-CM

## 2022-07-16 DIAGNOSIS — F1011 Alcohol abuse, in remission: Secondary | ICD-10-CM

## 2022-07-16 DIAGNOSIS — Z7985 Long-term (current) use of injectable non-insulin antidiabetic drugs: Secondary | ICD-10-CM | POA: Diagnosis not present

## 2022-07-16 DIAGNOSIS — Z794 Long term (current) use of insulin: Secondary | ICD-10-CM | POA: Diagnosis not present

## 2022-07-16 DIAGNOSIS — E1165 Type 2 diabetes mellitus with hyperglycemia: Secondary | ICD-10-CM

## 2022-07-16 DIAGNOSIS — Z09 Encounter for follow-up examination after completed treatment for conditions other than malignant neoplasm: Secondary | ICD-10-CM

## 2022-07-16 DIAGNOSIS — E785 Hyperlipidemia, unspecified: Secondary | ICD-10-CM | POA: Diagnosis not present

## 2022-07-16 DIAGNOSIS — E1169 Type 2 diabetes mellitus with other specified complication: Secondary | ICD-10-CM

## 2022-07-16 DIAGNOSIS — E669 Obesity, unspecified: Secondary | ICD-10-CM

## 2022-07-16 LAB — POCT GLYCOSYLATED HEMOGLOBIN (HGB A1C): HbA1c, POC (controlled diabetic range): 8.2 % — AB (ref 0.0–7.0)

## 2022-07-16 LAB — GLUCOSE, POCT (MANUAL RESULT ENTRY): POC Glucose: 118 mg/dl — AB (ref 70–99)

## 2022-07-16 MED ORDER — METFORMIN HCL 1000 MG PO TABS
1000.0000 mg | ORAL_TABLET | Freq: Two times a day (BID) | ORAL | 3 refills | Status: DC
Start: 2022-07-16 — End: 2023-01-20

## 2022-07-16 MED ORDER — ATORVASTATIN CALCIUM 40 MG PO TABS
40.0000 mg | ORAL_TABLET | Freq: Every day | ORAL | 1 refills | Status: DC
Start: 2022-07-16 — End: 2023-03-31

## 2022-07-16 MED ORDER — FENOFIBRATE 160 MG PO TABS
160.0000 mg | ORAL_TABLET | Freq: Every day | ORAL | 1 refills | Status: DC
Start: 2022-07-16 — End: 2022-10-21

## 2022-07-16 MED ORDER — OMEPRAZOLE 20 MG PO CPDR
20.0000 mg | DELAYED_RELEASE_CAPSULE | Freq: Every day | ORAL | 3 refills | Status: DC
Start: 2022-07-16 — End: 2022-10-21

## 2022-07-16 MED ORDER — BASAGLAR TEMPO PEN 100 UNIT/ML ~~LOC~~ SOPN
18.0000 [IU] | PEN_INJECTOR | Freq: Every day | SUBCUTANEOUS | 2 refills | Status: DC
Start: 2022-07-16 — End: 2022-10-21

## 2022-07-16 NOTE — Progress Notes (Signed)
Patient ID: Yolanda Flores, female   DOB: [birth year], 37 y.o.   MRN: [id number]   Yolanda Flores, is a 37 y.o. female  WUJ:[id number]  [id number]  DOB - 1985/10/30  Chief Complaint  Patient presents with   Diabetes    DM f/u. Med refills- Folic acid, pantoprozole Requesting to check cholesterol levels Nausea at night X1 week       Subjective:   Yolanda Flores is a 37 y.o. female here today for a follow up visit After hospitalization 5/2-5/4 with AKI and for diabetes check.  She has not drank alcohol  since May.  Compliant with metformin and 14 units basaglar.  Wants to get off cholesterol meds.  Some mild unsettled feelings in stomach for about 1 week but no abdominal pain.  No diarrhea or constipation.  No CP/SOB/dizziness.  Feeling much better overall since no longer drinking   From discharge summary: Brief/Interim Summary: Patient is a 8 female with history of pancreatitis hypertriglyceridemia, hypertension, diabetes, alcohol abuse who presented with intractable vomiting, nausea.  Report of recent  binge drinking.  On presentation, she was hemodynamically stable.  Lab work showed creatinine of 3.08, baseline creatinine normal, elevated liver enzymes, lipase of 21.  EKG showed QTc of 533 .  Patient was started on IV fluids.  Has significantly improved clinically.  No nausea or vomiting today.  Kidney function has almost normalized.  Follow-up EKG showed QTc of 459.  She has been counseled to stop drinking.  Medically stable for discharge home today   Following problems were addressed during the hospitalization:   AKI: Secondary to dehydration from intractable nausea/vomiting.  Kidney function significantly improved with iv fluid   Intractable nausea/vomiting: resolved   Chronic alcohol abuse: Continue thiamine ,folic acid. TOC consulted for resources. Drinks hard liquor.   Hypertriglyceridemia: On fibrate, Lipitor   Hyponatremia: Likely secondary to chronic alcohol abuse.   Improved   Hypertension: Takes lisinopril at home. BP soft so stopped   Uncontrolled diabetes type 2: Takes insulin, metformin at home.  Recent A1c of 9.1 as per 02/19/2022.  Consulted diabetic coordinator.  Follow-up closely with PCP and monitor blood sugars at home   Prolonged QTc: QTc of 530, follow-up QTc is 459 today   Elevated LFTs: likely asscoaited  with chronic alcohol abuse.  Continue to monitor as outpatient, mild    GERD: Continue PPI   Obesity: BMI 32.1   Discharge Diagnoses:  Principal Problem:   AKI (acute kidney injury) (HCC) Active Problems:   Hypertension associated with diabetes (HCC)   Alcohol abuse   Type 2 diabetes mellitus with obesity (HCC)   Abnormal LFTs   Prolonged QT interval   Heartburn No problems updated.  ALLERGIES: Allergies  Allergen Reactions   Ibuprofen Nausea And Vomiting    PAST MEDICAL HISTORY: Past Medical History:  Diagnosis Date   AKI (acute kidney injury) (HCC)    Diabetes mellitus (HCC)    HLD (hyperlipidemia)    Hypertension    Pancreatitis     MEDICATIONS AT HOME: Prior to Admission medications   Medication Sig Start Date End Date Taking? Authorizing Provider  aspirin-acetaminophen-caffeine (EXCEDRIN MIGRAINE) [id number] MG tablet Take 1-2 tablets by mouth every 6 (six) hours as needed for headache.   Yes [provider]  folic acid (FOLVITE) 1 MG tablet Take 1 tablet (1 mg total) by mouth daily. 05/17/22 07/16/22 Yes Burnadette Pop, MD  Multiple Vitamin (MULTIVITAMIN WITH MINERALS) TABS tablet Take 1 tablet by mouth daily. 01/18/19  Yes  Kathlen Mody, MD  omeprazole (PRILOSEC) 20 MG capsule Take 1 capsule (20 mg total) by mouth daily. 07/16/22  Yes Georgian Co M, PA-C  thiamine (VITAMIN B1) 100 MG tablet Take 1 tablet (100 mg total) by mouth daily. 05/17/22  Yes Burnadette Pop, MD  atorvastatin (LIPITOR) 40 MG tablet Take 1 tablet (40 mg total) by mouth daily. 07/16/22 08/15/22  Anders Simmonds, PA-C  fenofibrate 160  MG tablet Take 1 tablet (160 mg total) by mouth daily. 07/16/22 08/15/22  Anders Simmonds, PA-C  Insulin Glargine w/ Trans Port (BASAGLAR TEMPO PEN) 100 UNIT/ML SOPN Inject 18 Units into the skin daily. 07/16/22   Anders Simmonds, PA-C  metFORMIN (GLUCOPHAGE) 1000 MG tablet Take 1 tablet (1,000 mg total) by mouth 2 (two) times daily with a meal. 07/16/22   Ziv Welchel, Marzella Schlein, PA-C  pantoprazole (PROTONIX) 40 MG tablet Take 1 tablet (40 mg total) by mouth in the morning for 14 days. 05/18/22 06/01/22  Burnadette Pop, MD    ROS: Neg HEENT Neg resp Neg cardiac Neg GI Neg GU Neg MS Neg psych Neg neuro  Objective:   Vitals:   07/16/22 1019  BP: 112/62  Pulse: 92  Temp: 98.4 F (36.9 C)  TempSrc: Oral  SpO2: 100%  Weight: 206 lb (93.4 kg)  Height: 5\' 6"  (1.676 m)   Exam General appearance : Awake, alert, not in any distress. Speech Clear. Not toxic looking HEENT: Atraumatic and Normocephalic Neck: Supple, no JVD. No cervical lymphadenopathy.  Chest: Good air entry bilaterally, CTAB.  No rales/rhonchi/wheezing CVS: S1 S2 regular, no murmurs.  Extremities: B/L Lower Ext shows no edema, both legs are warm to touch Neurology: Awake alert, and oriented X 3, CN II-XII intact, Non focal Skin: No Rash  Data Review Lab Results  Component Value Date   HGBA1C 8.2 (A) 07/16/2022   HGBA1C 9.1 (H) 03/01/2022   HGBA1C 5.8 (H) 12/04/2021    Assessment & Plan   1. Type 2 diabetes mellitus with obesity (HCC) Increase basaglar from 14 to 18 units.  I have had a lengthy discussion and provided education about insulin resistance and the intake of too much sugar/refined carbohydrates.  I have advised the patient to work at a goal of eliminating sugary drinks, candy, desserts, sweets, refined sugars, processed foods, and white carbohydrates.  The patient expresses understanding.  - Glucose (CBG) - HgB A1c  2. Dyslipidemia Not recommended to stop these meds.  Dyslipidemia was present a long time  before she developed diabetes and runs in her family.  Continue meds - fenofibrate 160 MG tablet; Take 1 tablet (160 mg total) by mouth daily.  Dispense: 90 tablet; Refill: 1 - atorvastatin (LIPITOR) 40 MG tablet; Take 1 tablet (40 mg total) by mouth daily.  Dispense: 90 tablet; Refill: 1 - Lipid panel - Comprehensive metabolic panel  3.type 2 diabetes mellitus with hyperglycemia, unspecified whether long term insulin use (HCC) - Insulin Glargine w/ Trans Port (BASAGLAR TEMPO PEN) 100 UNIT/ML SOPN; Inject 18 Units into the skin daily.  Dispense: 9 mL; Refill: 2 - metFORMIN (GLUCOPHAGE) 1000 MG tablet; Take 1 tablet (1,000 mg total) by mouth 2 (two) times daily with a meal.  Dispense: 180 tablet; Refill: 3 - Comprehensive metabolic panel  4. Dyspepsia - omeprazole (PRILOSEC) 20 MG capsule; Take 1 capsule (20 mg total) by mouth daily.  Dispense: 30 capsule; Refill: 3  5. Alcohol use disorder, mild, in early remission [place].org is the website for narcotics anonymous [employer].cy (website)  or 509-210-7300 is the information for alcoholics anonymous Both are free and immediately available for help with alcohol and drug use and are recommended for success in long term sobriety   6. Encounter for examination following treatment at hospital Much improved    Return in about 3 months (around 10/16/2022) for Dr Laural Benes for chronic conditions.  The patient was given clear instructions to go to ER or return to medical center if symptoms don't improve, worsen or new problems develop. The patient verbalized understanding. The patient was told to call to get lab results if they haven't heard anything in the next week.      Georgian Co, PA-C [facility] [place], [place] [phone]   07/16/2022, 12:55 PM

## 2022-07-16 NOTE — Patient Instructions (Signed)
[employer] [place].org is the website for narcotics anonymous [employer] (website) or [phone] is the information for alcoholics anonymous Both are free and immediately available for help with alcohol and drug use   work at a goal of eliminating sugary drinks, candy, desserts, sweets, refined sugars, processed foods, and white carbohydrates.  T

## 2022-07-17 LAB — LIPID PANEL
Chol/HDL Ratio: 2.3 ratio (ref 0.0–4.4)
Cholesterol, Total: 103 mg/dL (ref 100–199)
HDL: 44 mg/dL (ref >39)
LDL Chol Calc (NIH): 43 mg/dL (ref 0–99)
Triglycerides: 81 mg/dL (ref 0–149)
VLDL Cholesterol Cal: 16 mg/dL (ref 5–40)

## 2022-07-17 LAB — COMPREHENSIVE METABOLIC PANEL
ALT: 31 IU/L (ref 0–32)
AST: 32 IU/L (ref 0–40)
Albumin: 4.6 g/dL (ref 3.9–4.9)
Alkaline Phosphatase: 36 IU/L — ABNORMAL LOW (ref 44–121)
BUN/Creatinine Ratio: 17 (ref 9–23)
BUN: 18 mg/dL (ref 6–20)
Bilirubin Total: 0.4 mg/dL (ref 0.0–1.2)
CO2: 24 mmol/L (ref 20–29)
Calcium: 10.3 mg/dL — ABNORMAL HIGH (ref 8.7–10.2)
Chloride: 98 mmol/L (ref 96–106)
Creatinine, Ser: 1.06 mg/dL — ABNORMAL HIGH (ref 0.57–1.00)
Globulin, Total: 3.1 g/dL (ref 1.5–4.5)
Glucose: 123 mg/dL — ABNORMAL HIGH (ref 70–99)
Potassium: 4.8 mmol/L (ref 3.5–5.2)
Sodium: 135 mmol/L (ref 134–144)
Total Protein: 7.7 g/dL (ref 6.0–8.5)
eGFR: 71 mL/min/{1.73_m2} (ref >59)

## 2022-07-18 ENCOUNTER — Other Ambulatory Visit: Payer: Self-pay | Admitting: Physician Assistant

## 2022-07-21 ENCOUNTER — Telehealth: Payer: Self-pay | Admitting: Internal Medicine

## 2022-07-21 NOTE — Telephone Encounter (Signed)
Copied from CRM [id number]. Topic: General - Other >> Jul 21, 2022 12:43 PM Ja-Kwan M wrote: Reason for CRM: Pt stated she has been taking lisinopril (ZESTRIL) 5 MG tablet because she did not realize that she was suppose to split it in half. Pt stated she needs the Rx for lisinopril (ZESTRIL) 5 MG tablet to be re-written for lisinopril (ZESTRIL) 2.5 MG tablet because it is too hard for her to try to split that small pill.

## 2022-07-23 ENCOUNTER — Ambulatory Visit: Payer: Commercial Managed Care - HMO | Admitting: Physician Assistant

## 2022-07-23 MED ORDER — LISINOPRIL 5 MG PO TABS: 5.0000 mg | ORAL_TABLET | Freq: Every day | ORAL | 0 refills | Status: DC

## 2022-07-23 NOTE — Addendum Note (Signed)
Addended by: Jonah Blue B on: 07/23/2022 08:42 AM   Modules accepted: Orders

## 2022-07-23 NOTE — Telephone Encounter (Signed)
Called but no answer. LVM to call back.  

## 2022-07-24 NOTE — Telephone Encounter (Signed)
Called but no answer. LVM to call back.  

## 2022-07-28 NOTE — Telephone Encounter (Signed)
FYI. Called but no answer. LVM to call back.

## 2022-08-28 ENCOUNTER — Other Ambulatory Visit: Payer: Self-pay | Admitting: Internal Medicine

## 2022-08-28 DIAGNOSIS — I10 Essential (primary) hypertension: Secondary | ICD-10-CM

## 2022-08-29 ENCOUNTER — Telehealth: Payer: Self-pay | Admitting: Internal Medicine

## 2022-08-29 NOTE — Telephone Encounter (Signed)
Medication Refill - Medication: lisinopril (ZESTRIL) 5 MG tablet [[id number]]   Has the patient contacted their pharmacy? Yes.   (Agent: If no, request that the patient contact the pharmacy for the refill. If patient does not wish to contact the pharmacy document the reason why and proceed with request.) (Agent: If yes, when and what did the pharmacy advise?)  Preferred Pharmacy (with phone number or street name): [id number] - [place], [place] - [address] AT [facility] [address] Phone: [phone]  Fax: [phone]   Has the patient been seen for an appointment in the last year OR does the patient have an upcoming appointment? Yes.    Agent: Please be advised that RX refills may take up to 3 business days. We ask that you follow-up with your pharmacy.

## 2022-08-29 NOTE — Telephone Encounter (Signed)
Pt called back and advised her that she should be taking 5 mg lisinopril> pt advised that med was called in to her pharmacy. Transferred pt to pharmacy so she can make sure med was filled to be picked up.

## 2022-10-21 ENCOUNTER — Ambulatory Visit: Payer: Managed Care, Other (non HMO) | Attending: Internal Medicine | Admitting: Internal Medicine

## 2022-10-21 ENCOUNTER — Encounter: Payer: Self-pay | Admitting: Internal Medicine

## 2022-10-21 VITALS — BP 155/115 | HR 81 | Temp 98.3°F | Ht 66.0 in | Wt 219.0 lb

## 2022-10-21 DIAGNOSIS — E1159 Type 2 diabetes mellitus with other circulatory complications: Secondary | ICD-10-CM

## 2022-10-21 DIAGNOSIS — E785 Hyperlipidemia, unspecified: Secondary | ICD-10-CM | POA: Diagnosis not present

## 2022-10-21 DIAGNOSIS — E1169 Type 2 diabetes mellitus with other specified complication: Secondary | ICD-10-CM

## 2022-10-21 DIAGNOSIS — Z23 Encounter for immunization: Secondary | ICD-10-CM | POA: Diagnosis not present

## 2022-10-21 DIAGNOSIS — Z7984 Long term (current) use of oral hypoglycemic drugs: Secondary | ICD-10-CM | POA: Diagnosis not present

## 2022-10-21 DIAGNOSIS — E119 Type 2 diabetes mellitus without complications: Secondary | ICD-10-CM

## 2022-10-21 DIAGNOSIS — M792 Neuralgia and neuritis, unspecified: Secondary | ICD-10-CM | POA: Diagnosis not present

## 2022-10-21 DIAGNOSIS — E669 Obesity, unspecified: Secondary | ICD-10-CM

## 2022-10-21 DIAGNOSIS — R1013 Epigastric pain: Secondary | ICD-10-CM

## 2022-10-21 DIAGNOSIS — E66811 Obesity, class 1: Secondary | ICD-10-CM

## 2022-10-21 DIAGNOSIS — Z794 Long term (current) use of insulin: Secondary | ICD-10-CM

## 2022-10-21 DIAGNOSIS — I152 Hypertension secondary to endocrine disorders: Secondary | ICD-10-CM

## 2022-10-21 DIAGNOSIS — Z6835 Body mass index (BMI) 35.0-35.9, adult: Secondary | ICD-10-CM

## 2022-10-21 LAB — POCT GLYCOSYLATED HEMOGLOBIN (HGB A1C): HbA1c, POC (controlled diabetic range): 11.3 % — AB (ref 0.0–7.0)

## 2022-10-21 LAB — GLUCOSE, POCT (MANUAL RESULT ENTRY): POC Glucose: 206 mg/dL — AB (ref 70–99)

## 2022-10-21 MED ORDER — FREESTYLE LIBRE 3 READER DEVI
1.0000 | Freq: Every day | 0 refills | Status: DC
Start: 2022-10-21 — End: 2023-01-18

## 2022-10-21 MED ORDER — OMEPRAZOLE 20 MG PO CPDR
20.0000 mg | DELAYED_RELEASE_CAPSULE | Freq: Every day | ORAL | 3 refills | Status: DC
Start: 2022-10-21 — End: 2023-03-31

## 2022-10-21 MED ORDER — BASAGLAR TEMPO PEN 100 UNIT/ML ~~LOC~~ SOPN
22.0000 [IU] | PEN_INJECTOR | Freq: Every day | SUBCUTANEOUS | 2 refills | Status: DC
Start: 2022-10-21 — End: 2023-03-31

## 2022-10-21 MED ORDER — PREGABALIN 25 MG PO CAPS
25.0000 mg | ORAL_CAPSULE | Freq: Two times a day (BID) | ORAL | 3 refills | Status: DC
Start: 2022-10-21 — End: 2023-03-31

## 2022-10-21 MED ORDER — LISINOPRIL 5 MG PO TABS: 5.0000 mg | ORAL_TABLET | Freq: Every day | ORAL | 1 refills | Status: DC

## 2022-10-21 MED ORDER — NOVOLOG FLEXPEN 100 UNIT/ML ~~LOC~~ SOPN
PEN_INJECTOR | SUBCUTANEOUS | 11 refills | Status: DC
Start: 2022-10-21 — End: 2022-10-27

## 2022-10-21 MED ORDER — FREESTYLE LIBRE 3 SENSOR MISC
6 refills | Status: AC
Start: 2022-10-21 — End: ?

## 2022-10-21 NOTE — Patient Instructions (Signed)
Stop fenofibrate.  Increase Basaglar insulin to 22 units daily.  We have add mealtime insulin: NovoLog to take 3 units with your 2 largest meals of the day. I have sent a prescription for the continuous glucose monitor. We have referred you for diabetic eye exam and to an endocrinologist.  We have started you on Lyrica 25 mg twice a day to help with the nerve pain in your left foot.  Your blood pressure is elevated.  Please take the lisinopril once you return home today.  Please take the lisinopril before coming in to your next appointment.

## 2022-10-21 NOTE — Progress Notes (Signed)
Patient ID: Yolanda Flores, female    DOB: 24-Apr-1985  MRN: [id number]  CC: Diabetes (DM & HTN f/u. Med refill./Requesting referral to Ortho for nerve damage on L foot /Yes to flu vax. )   Subjective: Yolanda Flores is a 37 y.o. female who presents for chronic ds management.  BF, Luanna Salk, is with her.   Her concerns today include:  Patient with history of DM, HTN, tobacco dependence, EtOH use disorder, alcoholic pancreatitis   DM/Obesity: Results for orders placed or performed in visit on 10/21/22  POCT glycosylated hemoglobin (Hb A1C)  Result Value Ref Range   Hemoglobin A1C     HbA1c POC (<> result, manual entry)     HbA1c, POC (prediabetic range)     HbA1c, POC (controlled diabetic range) 11.3 (A) 0.0 - 7.0 %  POCT glucose (manual entry)  Result Value Ref Range   POC Glucose 206 (A) 70 - 99 mg/dl  Q6V in July of this year was 8.2.  Patient should be on Basaglar insulin 18 units daily and metformin 1 g twice a day. Reports compliance -no blurred vision, or polyuria/polydipsia -not checking BS regularly; only if she feels BS are high; checks about every 2 days; gives range 124-130 in a.m before BF; depends on what she eats the night before -not getting in exercise, gained 13 lbs since last visit.  Works 3rd shift from 11 p.m to 7 a.m.  Snacks on chips late at nights then eats and sleeps when she gets off in a.m.  Loves fruits; cut back on bread, not eating a lot of fried foods but loves starches (potatoes and rice).  Drinks mainly water or zero calory soda occasionally.    C/o pain LT foot x 1 mth - sharp, shooting pain, tingling pain mainly on lateral aspect anterior toes.  Had similar pain in 2015 when she hit foot against metal foot rail. Was in boot for 3 mths.  MRI of the foot was negative.  Told it was nerve damage.  Was on Gabapentin which did help but made her drowsy.  Thinks she may need to see orthopedics again.  She was seeing Dr. Roda Shutters  Patient with history of HTN.  She was  previously on lisinopril that was discontinued posthospital in May as blood pressure at that time was soft.  She restarted the Lisinopril.  BP elev for past mth due to foot pain.  She has not taken lisinopril as yet for the morning.  Plans to take when she returns home.  EtOH use disorder: completely stopped after hosp in May.    HL:  taking fenofibrate and atorvastatin.  LDL was 43 and triglyceride 81 on lipid profile done 3 months ago.  Request refill on omeprazole for dyspepsia.  Patient Active Problem List   Diagnosis Date Noted   High anion gap metabolic acidosis 03/01/2022   Insulin dependent type 2 diabetes mellitus (HCC) 03/01/2022   Hyperkalemia 03/01/2022   Hypomagnesemia 03/01/2022   Metabolic acidosis, increased anion gap (IAG) 01/03/2022   DKA (diabetic ketoacidosis) (HCC) 12/05/2021   Class 1 obesity 12/05/2021   AKI (acute kidney injury) (HCC) 12/05/2021   Former smoker 02/26/2021   Type 2 diabetes mellitus with obesity (HCC) 02/26/2021   Macrocytic anemia 12/27/2019   Normocytic anemia 01/17/2019   Thrombocytosis 01/17/2019   Heartburn    Alcohol induced acute pancreatitis 01/04/2019   Hypertension associated with diabetes (HCC) 05/24/2018   Metabolic acidosis 05/24/2018   Alcohol abuse 05/24/2018  Abnormal LFTs 05/24/2018   Prolonged QT interval      Current Outpatient Medications on File Prior to Visit  Medication Sig Dispense Refill   aspirin-acetaminophen-caffeine (EXCEDRIN MIGRAINE) 250-250-65 MG tablet Take 1-2 tablets by mouth every 6 (six) hours as needed for headache.     atorvastatin (LIPITOR) 40 MG tablet Take 1 tablet (40 mg total) by mouth daily. 90 tablet 1   metFORMIN (GLUCOPHAGE) 1000 MG tablet Take 1 tablet (1,000 mg total) by mouth 2 (two) times daily with a meal. 180 tablet 3   Multiple Vitamin (MULTIVITAMIN WITH MINERALS) TABS tablet Take 1 tablet by mouth daily.     thiamine (VITAMIN B1) 100 MG tablet Take 1 tablet (100 mg total) by mouth  daily. 30 tablet 1   No current facility-administered medications on file prior to visit.    Allergies  Allergen Reactions   Ibuprofen Nausea And Vomiting    Social History   Socioeconomic History   Marital status: Single    Spouse name: Not on file   Number of children: Not on file   Years of education: Not on file   Highest education level: Not on file  Occupational History   Not on file  Tobacco Use   Smoking status: Former    Current packs/day: 0.00    Types: Cigarettes    Quit date: 05/24/2018    Years since quitting: 4.4   Smokeless tobacco: Never  Vaping Use   Vaping status: Never Used  Substance and Sexual Activity   Alcohol use: Not Currently    Comment: occasionally   Drug use: No   Sexual activity: Yes  Other Topics Concern   Not on file  Social History Narrative   Not on file   Social Determinants of Health   Financial Resource Strain: Low Risk  (10/21/2022)   Overall Financial Resource Strain (CARDIA)    Difficulty of Paying Living Expenses: Not hard at all  Food Insecurity: No Food Insecurity (10/21/2022)   Hunger Vital Sign    Worried About Running Out of Food in the Last Year: Never true    Ran Out of Food in the Last Year: Never true  Transportation Needs: No Transportation Needs (10/21/2022)   PRAPARE - Administrator, Civil Service (Medical): No    Lack of Transportation (Non-Medical): No  Physical Activity: Inactive (10/21/2022)   Exercise Vital Sign    Days of Exercise per Week: 0 days    Minutes of Exercise per Session: 0 min  Stress: No Stress Concern Present (10/21/2022)   [employer] of Occupational Health - Occupational Stress Questionnaire    Feeling of Stress : Not at all  Social Connections: Moderately Integrated (10/21/2022)   Social Connection and Isolation Panel [NHANES]    Frequency of Communication with Friends and Family: More than three times a week    Frequency of Social Gatherings with Friends and Family:  Never    Attends Religious Services: Never    Database administrator or Organizations: Yes    Attends Banker Meetings: 1 to 4 times per year    Marital Status: Living with partner  Intimate Partner Violence: Not At Risk (10/21/2022)   Humiliation, Afraid, Rape, and Kick questionnaire    Fear of Current or Ex-Partner: No    Emotionally Abused: No    Physically Abused: No    Sexually Abused: No    Family History  Problem Relation Age of Onset   Hypertension Mother  Diabetes Mother    Hypertension Father    Hypertension Brother     Past Surgical History:  Procedure Laterality Date   FOOT SURGERY      ROS: Review of Systems Negative except as stated above  PHYSICAL EXAM: BP (!) 155/115 (BP Location: Left Arm, Patient Position: Sitting, Cuff Size: Normal)   Pulse 81   Temp 98.3 F (36.8 C) (Oral)   Ht 5\' 6"  (1.676 m)   Wt 219 lb (99.3 kg)   SpO2 94%   BMI 35.35 kg/m   Wt Readings from Last 3 Encounters:  10/21/22 219 lb (99.3 kg)  07/16/22 206 lb (93.4 kg)  05/15/22 199 lb (90.3 kg)    Physical Exam  General appearance - alert, well appearing, young obese African-American female and in no distress Mental status - normal mood, behavior, speech, dress, motor activity, and thought processes Neck - supple, no significant adenopathy Chest - clear to auscultation, no wheezes, rales or rhonchi, symmetric air entry Heart - normal rate, regular rhythm, normal S1, S2, no murmurs, rubs, clicks or gallops Musculoskeletal -left foot: No edema or erythema noted.  Good range of motion of the toes. Extremities - peripheral pulses normal, no pedal edema, no clubbing or cyanosis Diabetic Foot Exam - Simple   Simple Foot Form Diabetic Foot exam was performed with the following findings: Yes 10/21/2022 11:05 AM  Visual Inspection No deformities, no ulcerations, no other skin breakdown bilaterally: Yes Sensation Testing Intact to touch and monofilament testing  bilaterally: Yes Pulse Check Posterior Tibialis and Dorsalis pulse intact bilaterally: Yes Comments         Latest Ref Rng & Units 07/16/2022   12:16 PM 05/17/2022    4:55 AM 05/16/2022    7:21 AM  CMP  Glucose 70 - 99 mg/dL 161  096  84   BUN 6 - 20 mg/dL 18  20  31    Creatinine 0.57 - 1.00 mg/dL 0.45  4.09  8.11   Sodium 134 - 144 mmol/L 135  132  130   Potassium 3.5 - 5.2 mmol/L 4.8  4.1  4.5   Chloride 96 - 106 mmol/L 98  92  92   CO2 20 - 29 mmol/L 24  30  20    Calcium 8.7 - 10.2 mg/dL 91.4  8.5  7.6   Total Protein 6.0 - 8.5 g/dL 7.7   7.5   Total Bilirubin 0.0 - 1.2 mg/dL 0.4   1.6   Alkaline Phos 44 - 121 IU/L 36   47   AST 0 - 40 IU/L 32   57   ALT 0 - 32 IU/L 31   45    Lipid Panel     Component Value Date/Time   CHOL 103 07/16/2022 1216   TRIG 81 07/16/2022 1216   HDL 44 07/16/2022 1216   CHOLHDL 2.3 07/16/2022 1216   CHOLHDL 4.4 03/01/2022 0133   VLDL UNABLE TO CALCULATE IF TRIGLYCERIDE OVER 400 mg/dL 78/29/5621 3086   LDLCALC 43 07/16/2022 1216   LDLDIRECT 189 (H) 03/01/2022 0133    CBC    Component Value Date/Time   WBC 6.0 05/16/2022 0630   RBC 4.07 05/16/2022 0630   HGB 11.8 (L) 05/16/2022 0630   HCT 33.7 (L) 05/16/2022 0630   PLT 308 05/16/2022 0630   MCV 82.8 05/16/2022 0630   MCH 29.0 05/16/2022 0630   MCHC 35.0 05/16/2022 0630   RDW 15.0 05/16/2022 0630   LYMPHSABS 1.9 02/28/2022 2033   MONOABS 0.6  02/28/2022 2033   EOSABS 0.1 02/28/2022 2033   BASOSABS 0.1 02/28/2022 2033    ASSESSMENT AND PLAN: 1. Type 2 diabetes mellitus with obesity (HCC) Discussed and encouraged healthy eating habits. Discussed getting her a continuous glucose monitor since she has not been checking blood sugars regularly.  She is agreeable to this if covered by her insurance.  Looks like it is. At first I wanted to put her on Ozempic to help with weight loss but decided against this since she has history of pancreatitis.  Recommend increase Basaglar insulin to 22  units daily.  Add NovoLog 3 units with 2 largest meals of the day. Will check antibodies to make sure that she is a type II diabetic. Referral to endocrinology and for diabetic eye exam. - POCT glycosylated hemoglobin (Hb A1C) - POCT glucose (manual entry) - Insulin Glargine w/ Trans Port (BASAGLAR TEMPO PEN) 100 UNIT/ML SOPN; Inject 22 Units into the skin daily.  Dispense: 9 mL; Refill: 2 - Microalbumin / creatinine urine ratio - Ambulatory referral to Ophthalmology - Continuous Glucose Sensor (FREESTYLE LIBRE 3 SENSOR) MISC; Change Q 2 weeks  Dispense: 2 each; Refill: 6 - Continuous Glucose Receiver (FREESTYLE LIBRE 3 READER) DEVI; 1 Device by Does not apply route daily.  Dispense: 1 each; Refill: 0 - Ambulatory referral to Endocrinology - insulin aspart (NOVOLOG FLEXPEN) 100 UNIT/ML FlexPen; 3 units subcut inj with the 2 largest meals of the day.  Dispense: 15 mL; Refill: 11 - Glutamic acid decarboxylase auto abs - IA-2 Autoantibodies  2. Insulin long-term use (HCC) See #1 above  3. Diabetes mellitus treated with oral medication (HCC) Continue metformin 1 g twice a day.  4. Hypertension associated with type 2 diabetes mellitus (HCC) Not at goal.  Repeat blood pressure today better.  She has not taken lisinopril as yet for today.  She will take it when she returns home. - lisinopril (ZESTRIL) 5 MG tablet; Take 1 tablet (5 mg total) by mouth daily.  Dispense: 90 tablet; Refill: 1  5. Neuropathic pain of left foot We discussed trying her with low-dose of Lyrica.  Advised that the medication can cause drowsiness.  She is agreeable to trying the medication. - pregabalin (LYRICA) 25 MG capsule; Take 1 capsule (25 mg total) by mouth 2 (two) times daily.  Dispense: 60 capsule; Refill: 3  6. Hyperlipidemia due to type 2 diabetes mellitus (HCC) Will have her stop fenofibrate.  Continue atorvastatin.  7. Dyspepsia - omeprazole (PRILOSEC) 20 MG capsule; Take 1 capsule (20 mg total) by mouth  daily.  Dispense: 30 capsule; Refill: 3  8. Encounter for immunization - Flu vaccine trivalent PF, 6mos and older(Flulaval,Afluria,Fluarix,Fluzone)    Patient was given the opportunity to ask questions.  Patient verbalized understanding of the plan and was able to repeat key elements of the plan.   This documentation was completed using Paediatric nurse.  Any transcriptional errors are unintentional.  Orders Placed This Encounter  Procedures   Flu vaccine trivalent PF, 6mos and older(Flulaval,Afluria,Fluarix,Fluzone)   Microalbumin / creatinine urine ratio   Glutamic acid decarboxylase auto abs   IA-2 Autoantibodies   Ambulatory referral to Ophthalmology   Ambulatory referral to Endocrinology   POCT glycosylated hemoglobin (Hb A1C)   POCT glucose (manual entry)     Requested Prescriptions   Signed Prescriptions Disp Refills   lisinopril (ZESTRIL) 5 MG tablet 90 tablet 1    Sig: Take 1 tablet (5 mg total) by mouth daily.   Insulin Glargine  w/ Trans Port (BASAGLAR TEMPO PEN) 100 UNIT/ML SOPN 9 mL 2    Sig: Inject 22 Units into the skin daily.   omeprazole (PRILOSEC) 20 MG capsule 30 capsule 3    Sig: Take 1 capsule (20 mg total) by mouth daily.   Continuous Glucose Sensor (FREESTYLE LIBRE 3 SENSOR) MISC 2 each 6    Sig: Change Q 2 weeks   Continuous Glucose Receiver (FREESTYLE LIBRE 3 READER) DEVI 1 each 0    Sig: 1 Device by Does not apply route daily.   pregabalin (LYRICA) 25 MG capsule 60 capsule 3    Sig: Take 1 capsule (25 mg total) by mouth 2 (two) times daily.   insulin aspart (NOVOLOG FLEXPEN) 100 UNIT/ML FlexPen 15 mL 11    Sig: 3 units subcut inj with the 2 largest meals of the day.    Return in about 6 weeks (around 12/02/2022) for for recheck BP and DM.  Jonah Blue, MD, FACP

## 2022-10-23 LAB — MICROALBUMIN / CREATININE URINE RATIO
Creatinine, Urine: 15.5 mg/dL
Microalb/Creat Ratio: <19 mg/g{creat} (ref 0–29)
Microalbumin, Urine: <3 ug/mL

## 2022-10-27 ENCOUNTER — Other Ambulatory Visit: Payer: Self-pay | Admitting: Internal Medicine

## 2022-10-27 DIAGNOSIS — E1169 Type 2 diabetes mellitus with other specified complication: Secondary | ICD-10-CM

## 2022-10-27 NOTE — Telephone Encounter (Signed)
Medication Refill - Medication: insulin aspart (NOVOLOG FLEXPEN) 100 UNIT/ML FlexPen   Has the patient contacted their pharmacy? yes ((Agent: If yes, when and what did the pharmacy advise?)contact pcp  Preferred Pharmacy (with phone number or street name):  [facility] #[id number] - [place], [place] - [address] AT [facility] [address] Phone: [phone]  Fax: [phone]     Has the patient been seen for an appointment in the last year OR does the patient have an upcoming appointment? yes  Agent: Please be advised that RX refills may take up to 3 business days. We ask that you follow-up with your pharmacy.

## 2022-10-28 ENCOUNTER — Other Ambulatory Visit: Payer: Self-pay

## 2022-10-28 ENCOUNTER — Telehealth: Payer: Self-pay | Admitting: Internal Medicine

## 2022-10-28 MED ORDER — INSULIN LISPRO (1 UNIT DIAL) 100 UNIT/ML (KWIKPEN): PEN_INJECTOR | SUBCUTANEOUS | 0 refills | Status: DC

## 2022-10-28 MED ORDER — NOVOLOG FLEXPEN 100 UNIT/ML ~~LOC~~ SOPN: PEN_INJECTOR | SUBCUTANEOUS | 11 refills | Status: DC

## 2022-10-28 NOTE — Telephone Encounter (Signed)
Yes ma'am, done.

## 2022-10-28 NOTE — Telephone Encounter (Signed)
Labs in date.   Resent rx  Requested Prescriptions  Pending Prescriptions Disp Refills   insulin aspart (NOVOLOG FLEXPEN) 100 UNIT/ML FlexPen 15 mL 11    Sig: 3 units subcut inj with the 2 largest meals of the day.     Endocrinology:  Diabetes - Insulins Failed - 10/27/2022  5:36 PM      Failed - HBA1C is between 0 and 7.9 and within 180 days    HbA1c, POC (controlled diabetic range)  Date Value Ref Range Status  10/21/2022 11.3 (A) 0.0 - 7.0 % Final         Passed - Valid encounter within last 6 months    Recent Outpatient Visits           1 week ago Type 2 diabetes mellitus with obesity [facility])   [place] [facility] Jonah Blue B, MD   3 months ago Type 2 diabetes mellitus with obesity [facility])   [place] [facility] [place], [place], [place]   6 months ago Diabetes mellitus type 2 in obese [facility])   [place] [facility] [place], [place], [place]   1 year ago Diabetes mellitus type 2 in obese [facility])   [place] [facility] Jonah Blue B, MD   1 year ago Diabetes mellitus type 2 in obese [facility])    [facility] Marcine Matar, MD       Future Appointments             In 1 month Laural Benes, Binnie Rail, MD [facility]

## 2022-10-28 NOTE — Telephone Encounter (Signed)
Jody with [employer] states the provider can submit the prior authorization for the medication prescribed, insulin aspart (NOVOLOG FLEXPEN) 100 UNIT/ML FlexPen  1 of 2 ways. One would be through Cover My Meds provided the provider has the key, the second would be verbal through the number listed and to select option 1. Please assist patient further.

## 2022-11-03 LAB — IA-2 AUTOANTIBODIES: IA-2 Autoantibodies: <7.5 U/mL

## 2022-11-03 LAB — GLUTAMIC ACID DECARBOXYLASE AUTO ABS: Glutamic Acid Decarb Ab: <5 U/mL (ref 0.0–5.0)

## 2022-12-02 ENCOUNTER — Ambulatory Visit: Payer: Managed Care, Other (non HMO) | Admitting: Internal Medicine

## 2022-12-12 ENCOUNTER — Other Ambulatory Visit: Payer: Self-pay

## 2022-12-12 ENCOUNTER — Encounter (HOSPITAL_COMMUNITY): Payer: Self-pay | Admitting: Emergency Medicine

## 2022-12-12 ENCOUNTER — Emergency Department (HOSPITAL_COMMUNITY): Payer: Commercial Managed Care - HMO

## 2022-12-12 ENCOUNTER — Observation Stay (HOSPITAL_COMMUNITY)
Admission: EM | Admit: 2022-12-12 | Discharge: 2022-12-13 | Disposition: A | Discharge status: Home / Self Care | Payer: Commercial Managed Care - HMO | Attending: Family Medicine | Admitting: Family Medicine

## 2022-12-12 DIAGNOSIS — I10 Essential (primary) hypertension: Secondary | ICD-10-CM | POA: Insufficient documentation

## 2022-12-12 DIAGNOSIS — T730XXA Starvation, initial encounter: Secondary | ICD-10-CM | POA: Diagnosis present

## 2022-12-12 DIAGNOSIS — Z87891 Personal history of nicotine dependence: Secondary | ICD-10-CM | POA: Diagnosis not present

## 2022-12-12 DIAGNOSIS — E1110 Type 2 diabetes mellitus with ketoacidosis without coma: Secondary | ICD-10-CM | POA: Diagnosis not present

## 2022-12-12 DIAGNOSIS — F1010 Alcohol abuse, uncomplicated: Secondary | ICD-10-CM | POA: Diagnosis present

## 2022-12-12 DIAGNOSIS — Z7984 Long term (current) use of oral hypoglycemic drugs: Secondary | ICD-10-CM | POA: Insufficient documentation

## 2022-12-12 DIAGNOSIS — Z6833 Body mass index (BMI) 33.0-33.9, adult: Secondary | ICD-10-CM | POA: Insufficient documentation

## 2022-12-12 DIAGNOSIS — Z79899 Other long term (current) drug therapy: Secondary | ICD-10-CM | POA: Insufficient documentation

## 2022-12-12 DIAGNOSIS — E8721 Acute metabolic acidosis: Secondary | ICD-10-CM | POA: Insufficient documentation

## 2022-12-12 DIAGNOSIS — R112 Nausea with vomiting, unspecified: Secondary | ICD-10-CM | POA: Diagnosis present

## 2022-12-12 DIAGNOSIS — E669 Obesity, unspecified: Secondary | ICD-10-CM | POA: Insufficient documentation

## 2022-12-12 DIAGNOSIS — Z794 Long term (current) use of insulin: Secondary | ICD-10-CM | POA: Diagnosis not present

## 2022-12-12 DIAGNOSIS — Z1152 Encounter for screening for COVID-19: Secondary | ICD-10-CM | POA: Insufficient documentation

## 2022-12-12 DIAGNOSIS — E66811 Obesity, class 1: Secondary | ICD-10-CM | POA: Diagnosis present

## 2022-12-12 DIAGNOSIS — K7010 Alcoholic hepatitis without ascites: Secondary | ICD-10-CM | POA: Diagnosis not present

## 2022-12-12 DIAGNOSIS — I152 Hypertension secondary to endocrine disorders: Secondary | ICD-10-CM | POA: Diagnosis present

## 2022-12-12 DIAGNOSIS — R7989 Other specified abnormal findings of blood chemistry: Secondary | ICD-10-CM | POA: Diagnosis present

## 2022-12-12 DIAGNOSIS — D75839 Thrombocytosis, unspecified: Secondary | ICD-10-CM | POA: Diagnosis present

## 2022-12-12 DIAGNOSIS — R7401 Elevation of levels of liver transaminase levels: Secondary | ICD-10-CM | POA: Insufficient documentation

## 2022-12-12 DIAGNOSIS — E119 Type 2 diabetes mellitus without complications: Secondary | ICD-10-CM

## 2022-12-12 DIAGNOSIS — Z7982 Long term (current) use of aspirin: Secondary | ICD-10-CM | POA: Insufficient documentation

## 2022-12-12 DIAGNOSIS — E8729 Other acidosis: Secondary | ICD-10-CM | POA: Diagnosis not present

## 2022-12-12 DIAGNOSIS — E1159 Type 2 diabetes mellitus with other circulatory complications: Secondary | ICD-10-CM | POA: Diagnosis present

## 2022-12-12 DIAGNOSIS — E872 Acidosis, unspecified: Secondary | ICD-10-CM | POA: Diagnosis present

## 2022-12-12 LAB — RESP PANEL BY RT-PCR (RSV, FLU A&B, COVID)  RVPGX2
Influenza A by PCR: NEGATIVE
Influenza B by PCR: NEGATIVE
Resp Syncytial Virus by PCR: NEGATIVE
SARS Coronavirus 2 by RT PCR: NEGATIVE

## 2022-12-12 LAB — BASIC METABOLIC PANEL WITH GFR
Anion gap: 20 — ABNORMAL HIGH (ref 5–15)
BUN: 20 mg/dL (ref 6–20)
CO2: 23 mmol/L (ref 22–32)
Calcium: 8.3 mg/dL — ABNORMAL LOW (ref 8.9–10.3)
Chloride: 88 mmol/L — ABNORMAL LOW (ref 98–111)
Creatinine, Ser: 1.14 mg/dL — ABNORMAL HIGH (ref 0.44–1.00)
GFR, Estimated: >60 mL/min
Glucose, Bld: 191 mg/dL — ABNORMAL HIGH (ref 70–99)
Potassium: 4.4 mmol/L (ref 3.5–5.1)
Sodium: 131 mmol/L — ABNORMAL LOW (ref 135–145)

## 2022-12-12 LAB — BASIC METABOLIC PANEL
Anion gap: 12 (ref 5–15)
BUN: 20 mg/dL (ref 6–20)
CO2: 29 mmol/L (ref 22–32)
Calcium: 7.8 mg/dL — ABNORMAL LOW (ref 8.9–10.3)
Chloride: 90 mmol/L — ABNORMAL LOW (ref 98–111)
Creatinine, Ser: 1 mg/dL (ref 0.44–1.00)
GFR, Estimated: >60 mL/min (ref >60)
Glucose, Bld: 173 mg/dL — ABNORMAL HIGH (ref 70–99)
Potassium: 4 mmol/L (ref 3.5–5.1)
Sodium: 131 mmol/L — ABNORMAL LOW (ref 135–145)

## 2022-12-12 LAB — BLOOD GAS, VENOUS
Acid-base deficit: 4.5 mmol/L — ABNORMAL HIGH (ref 0.0–2.0)
Bicarbonate: 19.2 mmol/L — ABNORMAL LOW (ref 20.0–28.0)
O2 Saturation: 82.6 %
Patient temperature: 37
pCO2, Ven: 31 mm[Hg] — ABNORMAL LOW (ref 44–60)
pH, Ven: 7.4 (ref 7.25–7.43)
pO2, Ven: 54 mm[Hg] — ABNORMAL HIGH (ref 32–45)

## 2022-12-12 LAB — HCG, SERUM, QUALITATIVE: Preg, Serum: NEGATIVE

## 2022-12-12 LAB — COMPREHENSIVE METABOLIC PANEL
ALT: 85 U/L — ABNORMAL HIGH (ref 0–44)
AST: 122 U/L — ABNORMAL HIGH (ref 15–41)
Albumin: 4.8 g/dL (ref 3.5–5.0)
Alkaline Phosphatase: 82 U/L (ref 38–126)
Anion gap: 34 — ABNORMAL HIGH (ref 5–15)
BUN: 21 mg/dL — ABNORMAL HIGH (ref 6–20)
CO2: 15 mmol/L — ABNORMAL LOW (ref 22–32)
Calcium: 8.9 mg/dL (ref 8.9–10.3)
Chloride: 86 mmol/L — ABNORMAL LOW (ref 98–111)
Creatinine, Ser: 1.2 mg/dL — ABNORMAL HIGH (ref 0.44–1.00)
GFR, Estimated: >60 mL/min (ref >60)
Glucose, Bld: 160 mg/dL — ABNORMAL HIGH (ref 70–99)
Potassium: 4.6 mmol/L (ref 3.5–5.1)
Sodium: 135 mmol/L (ref 135–145)
Total Bilirubin: 2 mg/dL — ABNORMAL HIGH (ref <1.2)
Total Protein: 9.4 g/dL — ABNORMAL HIGH (ref 6.5–8.1)

## 2022-12-12 LAB — GLUCOSE, CAPILLARY
Glucose-Capillary: 219 mg/dL — ABNORMAL HIGH (ref 70–99)
Glucose-Capillary: 240 mg/dL — ABNORMAL HIGH (ref 70–99)

## 2022-12-12 LAB — CK: Total CK: 169 U/L (ref 38–234)

## 2022-12-12 LAB — BETA-HYDROXYBUTYRIC ACID
Beta-Hydroxybutyric Acid: >8 mmol/L — ABNORMAL HIGH (ref 0.05–0.27)
Beta-Hydroxybutyric Acid: 3.71 mmol/L — ABNORMAL HIGH (ref 0.05–0.27)

## 2022-12-12 LAB — CBC WITH DIFFERENTIAL/PLATELET
Abs Immature Granulocytes: 0.02 10*3/uL (ref 0.00–0.07)
Basophils Absolute: 0.2 10*3/uL — ABNORMAL HIGH (ref 0.0–0.1)
Basophils Relative: 2 %
Eosinophils Absolute: 0 10*3/uL (ref 0.0–0.5)
Eosinophils Relative: 0 %
HCT: 38.3 % (ref 36.0–46.0)
Hemoglobin: 13.3 g/dL (ref 12.0–15.0)
Immature Granulocytes: 0 %
Lymphocytes Relative: 10 %
Lymphs Abs: 0.9 10*3/uL (ref 0.7–4.0)
MCH: 29 pg (ref 26.0–34.0)
MCHC: 34.7 g/dL (ref 30.0–36.0)
MCV: 83.6 fL (ref 80.0–100.0)
Monocytes Absolute: 0.6 10*3/uL (ref 0.1–1.0)
Monocytes Relative: 8 %
Neutro Abs: 6.8 10*3/uL (ref 1.7–7.7)
Neutrophils Relative %: 80 %
Platelets: 481 10*3/uL — ABNORMAL HIGH (ref 150–400)
RBC: 4.58 MIL/uL (ref 3.87–5.11)
RDW: 15.1 % (ref 11.5–15.5)
WBC: 8.5 10*3/uL (ref 4.0–10.5)
nRBC: 0 % (ref 0.0–0.2)

## 2022-12-12 LAB — TROPONIN I (HIGH SENSITIVITY)
Troponin I (High Sensitivity): 6 ng/L (ref <18)
Troponin I (High Sensitivity): 5 ng/L (ref <18)

## 2022-12-12 LAB — LACTIC ACID, PLASMA
Lactic Acid, Venous: 3.1 mmol/L (ref 0.5–1.9)
Lactic Acid, Venous: 2.4 mmol/L (ref 0.5–1.9)
Lactic Acid, Venous: 2.2 mmol/L (ref 0.5–1.9)

## 2022-12-12 LAB — CBG MONITORING, ED: Glucose-Capillary: 155 mg/dL — ABNORMAL HIGH (ref 70–99)

## 2022-12-12 LAB — ETHANOL: Alcohol, Ethyl (B): 29 mg/dL — ABNORMAL HIGH (ref <10)

## 2022-12-12 LAB — LIPASE, BLOOD: Lipase: 28 U/L (ref 11–51)

## 2022-12-12 LAB — MAGNESIUM: Magnesium: 1.6 mg/dL — ABNORMAL LOW (ref 1.7–2.4)

## 2022-12-12 LAB — MRSA NEXT GEN BY PCR, NASAL: MRSA by PCR Next Gen: NOT DETECTED

## 2022-12-12 MED ORDER — INSULIN ASPART 100 UNIT/ML IJ SOLN
8.0000 [IU] | Freq: Once | INTRAMUSCULAR | Status: AC
Start: 2022-12-12 — End: 2022-12-12
  Administered 2022-12-12: 8 [IU] via SUBCUTANEOUS
  Filled 2022-12-12: qty 0.08

## 2022-12-12 MED ORDER — DEXTROSE IN LACTATED RINGERS 5 % IV SOLN: INTRAVENOUS | Status: AC

## 2022-12-12 MED ORDER — LACTATED RINGERS IV BOLUS
1000.0000 mL | Freq: Once | INTRAVENOUS | Status: AC
  Administered 2022-12-12: 1000 mL via INTRAVENOUS

## 2022-12-12 MED ORDER — ONDANSETRON HCL 4 MG/2ML IJ SOLN
4.0000 mg | Freq: Once | INTRAMUSCULAR | Status: AC
  Administered 2022-12-12: 4 mg via INTRAVENOUS
  Filled 2022-12-12: qty 2

## 2022-12-12 MED ORDER — INSULIN GLARGINE-YFGN 100 UNIT/ML ~~LOC~~ SOLN
10.0000 [IU] | Freq: Once | SUBCUTANEOUS | Status: AC
  Administered 2022-12-12: 10 [IU] via SUBCUTANEOUS
  Filled 2022-12-12: qty 0.1

## 2022-12-12 MED ORDER — METOPROLOL TARTRATE 5 MG/5ML IV SOLN
5.0000 mg | Freq: Once | INTRAVENOUS | Status: AC
  Administered 2022-12-12: 5 mg via INTRAVENOUS
  Filled 2022-12-12: qty 5

## 2022-12-12 MED ORDER — LORAZEPAM 1 MG PO TABS
1.0000 mg | ORAL_TABLET | ORAL | Status: DC | PRN
  Filled 2022-12-12: qty 2

## 2022-12-12 MED ORDER — LORAZEPAM 2 MG/ML IJ SOLN
1.0000 mg | INTRAMUSCULAR | Status: DC | PRN
  Administered 2022-12-12: 2 mg via INTRAVENOUS
  Administered 2022-12-13: 1 mg via INTRAVENOUS
  Filled 2022-12-12 (×2): qty 1

## 2022-12-12 MED ORDER — PANTOPRAZOLE SODIUM 40 MG IV SOLR
40.0000 mg | Freq: Once | INTRAVENOUS | Status: AC
  Administered 2022-12-12: 40 mg via INTRAVENOUS
  Filled 2022-12-12: qty 10

## 2022-12-12 MED ORDER — ALUM & MAG HYDROXIDE-SIMETH 200-200-20 MG/5ML PO SUSP
30.0000 mL | Freq: Once | ORAL | Status: AC
  Administered 2022-12-12: 30 mL via ORAL
  Filled 2022-12-12: qty 30

## 2022-12-12 MED ORDER — ORAL CARE MOUTH RINSE: 15.0000 mL | OROMUCOSAL | Status: DC | PRN

## 2022-12-12 MED ORDER — LIDOCAINE VISCOUS HCL 2 % MT SOLN: 15.0000 mL | OROMUCOSAL | Status: DC | PRN

## 2022-12-12 MED ORDER — PREGABALIN 25 MG PO CAPS
25.0000 mg | ORAL_CAPSULE | Freq: Two times a day (BID) | ORAL | Status: DC
  Administered 2022-12-12 – 2022-12-13 (×2): 25 mg via ORAL
  Filled 2022-12-12 (×2): qty 1

## 2022-12-12 MED ORDER — INSULIN ASPART 100 UNIT/ML IJ SOLN
0.0000 [IU] | INTRAMUSCULAR | Status: DC
  Administered 2022-12-12: 5 [IU] via SUBCUTANEOUS
  Administered 2022-12-13: 2 [IU] via SUBCUTANEOUS

## 2022-12-12 MED ORDER — ACETAMINOPHEN 650 MG RE SUPP: 650.0000 mg | Freq: Four times a day (QID) | RECTAL | Status: DC | PRN

## 2022-12-12 MED ORDER — DEXTROSE 50 % IV SOLN
25.0000 g | Freq: Once | INTRAVENOUS | Status: AC
  Administered 2022-12-12: 25 g via INTRAVENOUS
  Filled 2022-12-12: qty 50

## 2022-12-12 MED ORDER — LISINOPRIL 2.5 MG PO TABS
5.0000 mg | ORAL_TABLET | Freq: Every day | ORAL | Status: DC
  Administered 2022-12-13: 5 mg via ORAL
  Filled 2022-12-12: qty 2

## 2022-12-12 MED ORDER — THIAMINE MONONITRATE 100 MG PO TABS
100.0000 mg | ORAL_TABLET | Freq: Every day | ORAL | Status: DC
  Administered 2022-12-13: 100 mg via ORAL
  Filled 2022-12-12: qty 1

## 2022-12-12 MED ORDER — KETOROLAC TROMETHAMINE 15 MG/ML IJ SOLN
15.0000 mg | Freq: Once | INTRAMUSCULAR | Status: AC
  Administered 2022-12-12: 15 mg via INTRAVENOUS
  Filled 2022-12-12: qty 1

## 2022-12-12 MED ORDER — IPRATROPIUM-ALBUTEROL 0.5-2.5 (3) MG/3ML IN SOLN: 3.0000 mL | RESPIRATORY_TRACT | Status: DC | PRN

## 2022-12-12 MED ORDER — DEXTROSE-SODIUM CHLORIDE 5-0.9 % IV SOLN: INTRAVENOUS | Status: AC

## 2022-12-12 MED ORDER — ADULT MULTIVITAMIN W/MINERALS CH
1.0000 | ORAL_TABLET | Freq: Every day | ORAL | Status: DC
  Administered 2022-12-13: 1 via ORAL
  Filled 2022-12-12: qty 1

## 2022-12-12 MED ORDER — LORAZEPAM 1 MG PO TABS: 0.0000 mg | ORAL_TABLET | Freq: Two times a day (BID) | ORAL | Status: DC

## 2022-12-12 MED ORDER — THIAMINE HCL 100 MG/ML IJ SOLN
100.0000 mg | Freq: Every day | INTRAMUSCULAR | Status: DC
  Administered 2022-12-12: 100 mg via INTRAVENOUS
  Filled 2022-12-12: qty 2

## 2022-12-12 MED ORDER — ENOXAPARIN SODIUM 40 MG/0.4ML IJ SOSY
40.0000 mg | PREFILLED_SYRINGE | INTRAMUSCULAR | Status: DC
  Administered 2022-12-12: 40 mg via SUBCUTANEOUS
  Filled 2022-12-12: qty 0.4

## 2022-12-12 MED ORDER — INSULIN ASPART 100 UNIT/ML IJ SOLN
0.0000 [IU] | Freq: Three times a day (TID) | INTRAMUSCULAR | Status: DC
  Administered 2022-12-12: 5 [IU] via SUBCUTANEOUS
  Filled 2022-12-12: qty 0.15

## 2022-12-12 MED ORDER — LACTATED RINGERS IV BOLUS: 1000.0000 mL | Freq: Once | INTRAVENOUS | Status: DC

## 2022-12-12 MED ORDER — DEXTROSE 5 % AND 0.9 % NACL IV BOLUS
1000.0000 mL | Freq: Once | INTRAVENOUS | Status: AC
  Administered 2022-12-12: 1000 mL via INTRAVENOUS

## 2022-12-12 MED ORDER — THIAMINE HCL 100 MG/ML IJ SOLN
100.0000 mg | Freq: Once | INTRAMUSCULAR | Status: AC
  Administered 2022-12-12: 100 mg via INTRAVENOUS
  Filled 2022-12-12: qty 2

## 2022-12-12 MED ORDER — MAGNESIUM SULFATE 2 GM/50ML IV SOLN
2.0000 g | Freq: Once | INTRAVENOUS | Status: AC
  Administered 2022-12-12: 2 g via INTRAVENOUS
  Filled 2022-12-12: qty 50

## 2022-12-12 MED ORDER — CHLORHEXIDINE GLUCONATE CLOTH 2 % EX PADS
6.0000 | MEDICATED_PAD | Freq: Every day | CUTANEOUS | Status: DC
  Administered 2022-12-13: 6 via TOPICAL

## 2022-12-12 MED ORDER — ALUM & MAG HYDROXIDE-SIMETH 200-200-20 MG/5ML PO SUSP
30.0000 mL | ORAL | Status: DC | PRN
  Administered 2022-12-12 – 2022-12-13 (×3): 30 mL via ORAL
  Filled 2022-12-12 (×4): qty 30

## 2022-12-12 MED ORDER — PROCHLORPERAZINE EDISYLATE 10 MG/2ML IJ SOLN: 10.0000 mg | Freq: Four times a day (QID) | INTRAMUSCULAR | Status: DC | PRN

## 2022-12-12 MED ORDER — ACETAMINOPHEN 325 MG PO TABS: 650.0000 mg | ORAL_TABLET | Freq: Four times a day (QID) | ORAL | Status: DC | PRN

## 2022-12-12 MED ORDER — LORAZEPAM 1 MG PO TABS: 0.0000 mg | ORAL_TABLET | Freq: Four times a day (QID) | ORAL | Status: DC

## 2022-12-12 MED ORDER — FOLIC ACID 1 MG PO TABS
1.0000 mg | ORAL_TABLET | Freq: Every day | ORAL | Status: DC
  Administered 2022-12-13: 1 mg via ORAL
  Filled 2022-12-12: qty 1

## 2022-12-12 MED ORDER — INSULIN GLARGINE-YFGN 100 UNIT/ML ~~LOC~~ SOLN
22.0000 [IU] | Freq: Every day | SUBCUTANEOUS | Status: DC
  Administered 2022-12-13: 22 [IU] via SUBCUTANEOUS
  Filled 2022-12-12: qty 0.22

## 2022-12-12 NOTE — H&P (Signed)
History and Physical    Patient: Yolanda Flores ZDG:[id number] DOB: 02-22-1985 DOA: 12/12/2022 DOS: the patient was seen and examined on 12/12/2022 PCP: Marcine Matar, MD  Patient coming from: Home  Chief Complaint:  Chief Complaint  Patient presents with   Shortness of Breath   Nausea   Emesis   HPI: Yolanda Flores is a 37 y.o. female with medical history significant of AKI, type 2 diabetes, hyperlipidemia, hypertension, obesity, abnormal LFTs, pancreatitis, hypomagnesemia, type II DM, history of DKA, macrocytic anemia, metabolic acidosis, prolonged QT interval, normocytic anemia who presented to the emergency department with complaints of dyspnea, nausea and vomiting.  She has been binge drinking liquor for about a week due to anxiety and stress. She has not used her insulin in the past 3 days.  She has not been eating in the last few days or taking her medications.  She has has some coughing spells after vomiting, which are mostly nonproductive. No  constipation, melena or hematochezia.  No flank pain, dysuria, frequency or hematuria. She denied fever, wheezing or hemoptysis.  No chest pain, palpitations, diaphoresis, PND, orthopnea or pitting edema of the lower extremities.  No polyuria, polydipsia, polyphagia or blurred vision.   Lab work: Her CBC showed a white count of 8.5, hemoglobin 13.3 g deciliter platelets 181.  Coronavirus, influenza and RSV PCR were negative.  Venous blood gas showed a pH of 7.4, pCO2 of 31 and pO2 of 54 mmHg.  Bicarbonate was 19.2 and acid-base deficit 4.5 mmol/L.  Beta hydroxybutyric acid was greater than 8.00 mmol/L.  Lipase was normal.  Unremarkable total CK.  Magnesium was 1.6 mg/dL.  First troponin normal.  CMP showed sodium 135, potassium 4.6, chloride 86 and CO2 15 mmol/L with an anion gap of 34.  Glucose 160, BUN 21, creatinine 1.20 and total bilirubin 2.0 mg/dL.  Total protein 9.4 and albumin 4.8 g/dL.  AST 122, ALT 85 alkaline phosphatase 82  units/L.  Imaging: Portable 1 view chest radiograph with normal.   ED course: Initial vital signs were temperature 98.7 F, pulse 120, respiration 13, BP 137/107 mmHg O2 sat 97% on room air.  The patient received 1000 mL of LR bolus and ondansetron 4 mg IVP.  Review of Systems: As mentioned in the history of present illness. All other systems reviewed and are negative. Past Medical History:  Diagnosis Date   AKI (acute kidney injury) (HCC)    Diabetes mellitus (HCC)    HLD (hyperlipidemia)    Hypertension    Pancreatitis    Past Surgical History:  Procedure Laterality Date   FOOT SURGERY     Social History:  reports that she quit smoking about 4 years ago. Her smoking use included cigarettes. She has never used smokeless tobacco. She reports that she does not currently use alcohol. She reports that she does not use drugs.  Allergies  Allergen Reactions   Ibuprofen Nausea And Vomiting    Family History  Problem Relation Age of Onset   Hypertension Mother    Diabetes Mother    Hypertension Father    Hypertension Brother     Prior to Admission medications   Medication Sig Start Date End Date Taking? Authorizing Provider  aspirin-acetaminophen-caffeine (EXCEDRIN MIGRAINE) [id number] MG tablet Take 1-2 tablets by mouth every 6 (six) hours as needed for headache.   Yes [provider]  atorvastatin (LIPITOR) 40 MG tablet Take 1 tablet (40 mg total) by mouth daily. 07/16/22 12/12/22 Yes Anders Simmonds, PA-C  Insulin Glargine w/ Trans Port (BASAGLAR TEMPO PEN) 100 UNIT/ML SOPN Inject 22 Units into the skin daily. 10/21/22  Yes Marcine Matar, MD  insulin lispro (HUMALOG KWIKPEN) 100 UNIT/ML KwikPen 3 units subcut inj with the 2 largest meals of the day. 10/28/22  Yes Marcine Matar, MD  lisinopril (ZESTRIL) 5 MG tablet Take 1 tablet (5 mg total) by mouth daily. 10/21/22  Yes Marcine Matar, MD  metFORMIN (GLUCOPHAGE) 1000 MG tablet Take 1 tablet (1,000 mg total)  by mouth 2 (two) times daily with a meal. 07/16/22  Yes McClung, Angela M, PA-C  omeprazole (PRILOSEC) 20 MG capsule Take 1 capsule (20 mg total) by mouth daily. 10/21/22  Yes Marcine Matar, MD  pregabalin (LYRICA) 25 MG capsule Take 1 capsule (25 mg total) by mouth 2 (two) times daily. 10/21/22  Yes Marcine Matar, MD  thiamine (VITAMIN B1) 100 MG tablet Take 1 tablet (100 mg total) by mouth daily. 05/17/22  Yes Burnadette Pop, MD  Continuous Glucose Receiver (FREESTYLE LIBRE 3 READER) DEVI 1 Device by Does not apply route daily. 10/21/22   Marcine Matar, MD  Continuous Glucose Sensor (FREESTYLE LIBRE 3 SENSOR) MISC Change Q 2 weeks 10/21/22   Marcine Matar, MD  Multiple Vitamin (MULTIVITAMIN WITH MINERALS) TABS tablet Take 1 tablet by mouth daily. 01/18/19   Kathlen Mody, MD    Physical Exam: Vitals:   12/12/22 1050  BP: (!) 137/107  Pulse: (!) 112  Resp: 13  Temp: 98.7 F (37.1 C)  TempSrc: Oral  SpO2: 97%   Physical Exam Vitals and nursing note reviewed.  Constitutional:      General: She is awake. She is not in acute distress.    Appearance: She is well-developed. She is obese. She is ill-appearing.  HENT:     Head: Normocephalic.     Nose: No rhinorrhea.     Mouth/Throat:     Mouth: Mucous membranes are dry.  Eyes:     General: No scleral icterus.    Pupils: Pupils are equal, round, and reactive to light.  Neck:     Vascular: No JVD.  Cardiovascular:     Rate and Rhythm: Normal rate and regular rhythm.     Heart sounds: S1 normal and S2 normal.  Pulmonary:     Effort: Pulmonary effort is normal.     Breath sounds: Normal breath sounds. No wheezing, rhonchi or rales.  Abdominal:     General: Abdomen is protuberant. Bowel sounds are normal. There is no distension.     Palpations: Abdomen is soft.     Tenderness: There is abdominal tenderness in the epigastric area. There is no right CVA tenderness, left CVA tenderness or guarding.  Musculoskeletal:      Cervical back: Neck supple.     Right lower leg: No edema.     Left lower leg: No edema.  Skin:    General: Skin is warm and dry.  Neurological:     General: No focal deficit present.     Mental Status: She is alert and oriented to person, place, and time.  Psychiatric:        Behavior: Behavior normal. Behavior is cooperative.     Data Reviewed:  Results are pending, will review when available.  Assessment and Plan: Principal Problem:   High anion gap metabolic acidosis Secondary to: Lactic acidosis  And   Alcoholic ketoacidosis Observation/stepdown. Clear liquid diet. Continue IV fluids with dextrose. CBG monitoring with RI  SS every 4 hours BMP every 4 hours x 2. Recheck BHA and lactic acid. Replace electrolytes as needed. Advised to cease alcohol. Advised not to discontinue insulin was medical guidance.  Active Problems:   Insulin dependent type 2 diabetes mellitus (HCC) Continue management as above.    Hypertension associated with diabetes (HCC) On lisinopril 5 mg p.o. daily.    Alcohol abuse CIWA protocol with lorazepam. Magnesium sulfate supplementation. Folate, MVI and thiamine. Consult TOC team.    Abnormal LFTs Alcohol cessation. Hold atorvastatin. Follow-up LFTs in AM.    Hypomagnesemia In the setting of alcohol abuse. Replacing.    Thrombocytosis Secondary to hemoconcentration. Follow-up platelet count after hydration.    Class 1 obesity Current BMI 33.39 kg/m. Lifestyle modifications. Follow-up as an outpatient with PCP.    Advance Care Planning:   Code Status: Full Code   Consults:   Family Communication:   Severity of Illness: The appropriate patient status for this patient is OBSERVATION. Observation status is judged to be reasonable and necessary in order to provide the required intensity of service to ensure the patient's safety. The patient's presenting symptoms, physical exam findings, and initial radiographic and laboratory  data in the context of their medical condition is felt to place them at decreased risk for further clinical deterioration. Furthermore, it is anticipated that the patient will be medically stable for discharge from the hospital within 2 midnights of admission.   Author: Bobette Mo, MD 12/12/2022 2:29 PM  For on call review [email or web address].   This document was prepared using Dragon voice recognition software and may contain some unintended transcription errors.

## 2022-12-12 NOTE — ED Notes (Signed)
ED TO INPATIENT HANDOFF REPORT  Name/Age/Gender Yolanda Flores 37 y.o. female  Code Status    Code Status Orders  (From admission, onward)           Start     Ordered   12/12/22 1423  Full code  Continuous       Question:  By:  Answer:  Consent: discussion documented in EHR   12/12/22 1427           Code Status History     Date Active Date Inactive Code Status Order ID Comments User Context   05/15/2022 2355 05/17/2022 1742 Full Code [id number]  Inez Catalina, MD ED   03/01/2022 0035 03/03/2022 1554 Full Code [id number]  Charlsie Quest, MD ED   01/04/2022 0030 01/05/2022 1757 Full Code [id number]  Lurline Del, MD ED   12/04/2021 2327 12/07/2021 2004 Full Code [id number]  Therisa Doyne, MD Inpatient   12/24/2019 1645 12/28/2019 1819 Full Code [id number]  Steffanie Dunn, DO ED   01/04/2019 1736 01/18/2019 2226 Full Code [id number]  Merlene Laughter, DO ED   05/24/2018 2050 05/27/2018 1854 Full Code [id number]  Lorretta Harp, MD ED       Home/SNF/Other Home  Chief Complaint Alcoholic ketoacidosis [E87.29]  Level of Care/Admitting Diagnosis ED Disposition     ED Disposition  Admit   Condition  --   Comment  Hospital Area: [facility] [[id number]]  Level of Care: Stepdown [14]  Admit to SDU based on following criteria: Severe physiological/psychological symptoms:  Any diagnosis requiring assessment & intervention at least every 4 hours on an ongoing basis to obtain desired patient outcomes including stability and rehabilitation  May place patient in observation at [facility] or [facility] if equivalent level of care is available:: No  Covid Evaluation: Asymptomatic - no recent exposure (last 10 days) testing not required  Diagnosis: Alcoholic ketoacidosis [172225]  Admitting Physician: Bobette Mo [[id number]]  Attending Physician: Bobette Mo [[id number]]          Medical History Past Medical History:  Diagnosis Date   AKI  (acute kidney injury) (HCC)    Diabetes mellitus (HCC)    HLD (hyperlipidemia)    Hypertension    Pancreatitis     Allergies Allergies  Allergen Reactions   Ibuprofen Nausea And Vomiting    IV Location/Drains/Wounds Patient Lines/Drains/Airways Status     Active Line/Drains/Airways     Name Placement date Placement time Site Days   Peripheral IV 12/12/22 20 G Left Antecubital 12/12/22  1138  Antecubital  less than 1            Labs/Imaging Results for orders placed or performed during the hospital encounter of 12/12/22 (from the past 48 hour(s))  CBC with Differential     Status: Abnormal   Collection Time: 12/12/22 11:01 AM  Result Value Ref Range   WBC 8.5 4.0 - 10.5 K/uL   RBC 4.58 3.87 - 5.11 MIL/uL   Hemoglobin 13.3 12.0 - 15.0 g/dL   HCT 06.2 69.4 - 85.4 %   MCV 83.6 80.0 - 100.0 fL   MCH 29.0 26.0 - 34.0 pg   MCHC 34.7 30.0 - 36.0 g/dL   RDW 62.7 03.5 - 00.9 %   Platelets 481 (H) 150 - 400 K/uL   nRBC 0.0 0.0 - 0.2 %   Neutrophils Relative % 80 %   Neutro Abs 6.8 1.7 - 7.7 K/uL   Lymphocytes Relative 10 %  Lymphs Abs 0.9 0.7 - 4.0 K/uL   Monocytes Relative 8 %   Monocytes Absolute 0.6 0.1 - 1.0 K/uL   Eosinophils Relative 0 %   Eosinophils Absolute 0.0 0.0 - 0.5 K/uL   Basophils Relative 2 %   Basophils Absolute 0.2 (H) 0.0 - 0.1 K/uL   Immature Granulocytes 0 %   Abs Immature Granulocytes 0.02 0.00 - 0.07 K/uL    Comment: Performed at [facility], [address] [place], [place] [address]  Troponin I (High Sensitivity)     Status: None   Collection Time: 12/12/22 11:01 AM  Result Value Ref Range   Troponin I (High Sensitivity) 6 <18 ng/L    Comment: (NOTE) Elevated high sensitivity troponin I (hsTnI) values and significant  changes across serial measurements may suggest ACS but many other  chronic and acute conditions are known to elevate hsTnI results.  Refer to the "Links" section for chest pain algorithms and additional   guidance. Performed at [facility], [address], [place], [place] [address]   Comprehensive metabolic panel     Status: Abnormal   Collection Time: 12/12/22 11:01 AM  Result Value Ref Range   Sodium 135 135 - 145 mmol/L    Comment: ELECTROLYTES REPEATED TO VERIFY   Potassium 4.6 3.5 - 5.1 mmol/L   Chloride 86 (L) 98 - 111 mmol/L    Comment: ELECTROLYTES REPEATED TO VERIFY   CO2 15 (L) 22 - 32 mmol/L    Comment: ELECTROLYTES REPEATED TO VERIFY   Glucose, Bld 160 (H) 70 - 99 mg/dL    Comment: Glucose reference range applies only to samples taken after fasting for at least 8 hours.   BUN 21 (H) 6 - 20 mg/dL   Creatinine, Ser 0.98 (H) 0.44 - 1.00 mg/dL   Calcium 8.9 8.9 - 11.9 mg/dL   Total Protein 9.4 (H) 6.5 - 8.1 g/dL   Albumin 4.8 3.5 - 5.0 g/dL   AST 147 (H) 15 - 41 U/L   ALT 85 (H) 0 - 44 U/L   Alkaline Phosphatase 82 38 - 126 U/L   Total Bilirubin 2.0 (H) <1.2 mg/dL   GFR, Estimated >82 >95 mL/min    Comment: (NOTE) Calculated using the CKD-EPI Creatinine Equation (2021)    Anion gap 34 (H) 5 - 15    Comment: CRITICAL RESULT CALLED TO, READ BACK BY AND VERIFIED WITH Clelia Croft, S RN @ [phone] ON 12/12/22 CAL Performed at [facility], [address], [place], [place] [address]   Lipase, blood     Status: None   Collection Time: 12/12/22 11:01 AM  Result Value Ref Range   Lipase 28 11 - 51 U/L    Comment: Performed at [facility], [address], [place], [place] [address]  Magnesium     Status: Abnormal   Collection Time: 12/12/22 11:01 AM  Result Value Ref Range   Magnesium 1.6 (L) 1.7 - 2.4 mg/dL    Comment: Performed at [facility], [address] [place], [place] [address]  CK     Status: None   Collection Time: 12/12/22 11:01 AM  Result Value Ref Range   Total CK 169 38 - 234 U/L    Comment: Performed at [facility], [address] [place], [place] [address]  Blood  gas, venous     Status: Abnormal   Collection Time: 12/12/22 11:02 AM  Result Value Ref Range  pH, Ven 7.4 7.25 - 7.43   pCO2, Ven 31 (L) 44 - 60 mmHg   pO2, Ven 54 (H) 32 - 45 mmHg   Bicarbonate 19.2 (L) 20.0 - 28.0 mmol/L   Acid-base deficit 4.5 (H) 0.0 - 2.0 mmol/L   O2 Saturation 82.6 %   Patient temperature 37.0     Comment: Performed at [facility], [address], [place], [place] [address]  Beta-hydroxybutyric acid     Status: Abnormal   Collection Time: 12/12/22 11:02 AM  Result Value Ref Range   Beta-Hydroxybutyric Acid >8.00 (H) 0.05 - 0.27 mmol/L    Comment: RESULT CONFIRMED BY MANUAL DILUTION Performed at [facility], [address] [place], [place] [address]   Resp panel by RT-PCR (RSV, Flu A&B, Covid) Anterior Nasal Swab     Status: None   Collection Time: 12/12/22 11:02 AM   Specimen: Anterior Nasal Swab  Result Value Ref Range   SARS Coronavirus 2 by RT PCR NEGATIVE NEGATIVE    Comment: (NOTE) SARS-CoV-2 target nucleic acids are NOT DETECTED.  The SARS-CoV-2 RNA is generally detectable in upper respiratory specimens during the acute phase of infection. The lowest concentration of SARS-CoV-2 viral copies this assay can detect is 138 copies/mL. A negative result does not preclude SARS-Cov-2 infection and should not be used as the sole basis for treatment or other patient management decisions. A negative result may occur with  improper specimen collection/handling, submission of specimen other than nasopharyngeal swab, presence of viral mutation(s) within the areas targeted by this assay, and inadequate number of viral copies(<138 copies/mL). A negative result must be combined with clinical observations, patient history, and epidemiological information. The expected result is Negative.  Fact Sheet for Patients:  BloggerCourse.com  Fact Sheet for Healthcare Providers:   SeriousBroker.it  This test is no t yet approved or cleared by the [place] FDA and  has been authorized for detection and/or diagnosis of SARS-CoV-2 by FDA under an Emergency Use Authorization (EUA). This EUA will remain  in effect (meaning this test can be used) for the duration of the COVID-19 declaration under Section 564(b)(1) of the Act, 21 U.S.C.section 360bbb-3(b)(1), unless the authorization is terminated  or revoked sooner.       Influenza A by PCR NEGATIVE NEGATIVE   Influenza B by PCR NEGATIVE NEGATIVE    Comment: (NOTE) The Xpert Xpress SARS-CoV-2/FLU/RSV plus assay is intended as an aid in the diagnosis of influenza from Nasopharyngeal swab specimens and should not be used as a sole basis for treatment. Nasal washings and aspirates are unacceptable for Xpert Xpress SARS-CoV-2/FLU/RSV testing.  Fact Sheet for Patients: BloggerCourse.com  Fact Sheet for Healthcare Providers: SeriousBroker.it  This test is not yet approved or cleared by the [place] FDA and has been authorized for detection and/or diagnosis of SARS-CoV-2 by FDA under an Emergency Use Authorization (EUA). This EUA will remain in effect (meaning this test can be used) for the duration of the COVID-19 declaration under Section 564(b)(1) of the Act, 21 U.S.C. section 360bbb-3(b)(1), unless the authorization is terminated or revoked.     Resp Syncytial Virus by PCR NEGATIVE NEGATIVE    Comment: (NOTE) Fact Sheet for Patients: BloggerCourse.com  Fact Sheet for Healthcare Providers: SeriousBroker.it  This test is not yet approved or cleared by the [place] FDA and has been authorized for detection and/or diagnosis of SARS-CoV-2 by FDA under an Emergency Use Authorization (EUA). This EUA will remain in effect (meaning this test  can be used) for the duration of  the COVID-19 declaration under Section 564(b)(1) of the Act, 21 U.S.C. section 360bbb-3(b)(1), unless the authorization is terminated or revoked.  Performed at [facility], [address], [place], [place] [address]   CBG monitoring, ED     Status: Abnormal   Collection Time: 12/12/22 11:33 AM  Result Value Ref Range   Glucose-Capillary 155 (H) 70 - 99 mg/dL    Comment: Glucose reference range applies only to samples taken after fasting for at least 8 hours.   DG Chest Portable 1 View  Result Date: 12/12/2022 CLINICAL DATA:  Cough.  Body aches. EXAM: PORTABLE CHEST 1 VIEW COMPARISON:  02/28/2022. FINDINGS: Bilateral lung fields are clear. Bilateral costophrenic angles are clear. Normal cardio-mediastinal silhouette. No acute osseous abnormalities. The soft tissues are within normal limits. IMPRESSION: No active disease. Electronically Signed   By: Jules Schick M.D.   On: 12/12/2022 11:28    Pending Labs Unresulted Labs (From admission, onward)     Start     Ordered   12/13/22 0500  HIV Antibody (routine testing w rflx)  (HIV Antibody (Routine testing w reflex) panel)  Tomorrow morning,   R        12/12/22 1427   12/13/22 0500  Comprehensive metabolic panel  Tomorrow morning,   R        12/12/22 1427   12/13/22 0500  CBC  Tomorrow morning,   R        12/12/22 1427   12/12/22 1600  hCG, serum, qualitative  Once,   R        12/12/22 1600   12/12/22 1244  Lactic acid, plasma  (Lactic Acid)  Now then every 2 hours,   R      12/12/22 1243   12/12/22 1244  Ethanol  Once,   URGENT        12/12/22 1243   12/12/22 1102  Urinalysis, w/ Reflex to Culture (Infection Suspected) -Urine, Clean Catch  Once,   URGENT       Question:  Specimen Source  Answer:  Urine, Clean Catch   12/12/22 1101            Vitals/Pain Today's Vitals   12/12/22 1050  BP: (!) 137/107  Pulse: (!) 112  Resp: 13  Temp: 98.7 F (37.1 C)  TempSrc: Oral  SpO2: 97%    Isolation  Precautions No active isolations  Medications Medications  dextrose 5 %-0.9 % sodium chloride infusion ( Intravenous New Bag/Given 12/12/22 1359)  enoxaparin (LOVENOX) injection 40 mg (has no administration in time range)  acetaminophen (TYLENOL) tablet 650 mg (has no administration in time range)    Or  acetaminophen (TYLENOL) suppository 650 mg (has no administration in time range)  prochlorperazine (COMPAZINE) injection 10 mg (has no administration in time range)  insulin aspart (novoLOG) injection 0-15 Units (has no administration in time range)  lactated ringers bolus 1,000 mL (0 mLs Intravenous Stopped 12/12/22 1333)  ondansetron (ZOFRAN) injection 4 mg (4 mg Intravenous Given 12/12/22 1153)  ketorolac (TORADOL) 15 MG/ML injection 15 mg (15 mg Intravenous Given 12/12/22 1357)  alum & mag hydroxide-simeth (MAALOX/MYLANTA) 200-200-20 MG/5ML suspension 30 mL (30 mLs Oral Given 12/12/22 1357)  thiamine (VITAMIN B1) injection 100 mg (100 mg Intravenous Given 12/12/22 1359)  dextrose 50 % solution 25 g (25 g Intravenous Given 12/12/22 1359)  insulin aspart (novoLOG) injection 8 Units (8 Units Subcutaneous Given 12/12/22 1359)    Mobility walks

## 2022-12-12 NOTE — ED Triage Notes (Signed)
Pt BIB EMS from home, c/o generalized body ache, SOB, nausea/vomit, nasal congestion x 1 week. Has not been able to take oral meds in a few days.   BP 140/110 P 130 SpO2 95% ra CBG 161

## 2022-12-12 NOTE — ED Provider Notes (Signed)
[place] [place] EMERGENCY DEPARTMENT AT [facility] Provider Note   CSN: [id number] Arrival date & time: 12/12/22  1034     History  Chief Complaint  Patient presents with   Shortness of Breath   Nausea   Emesis    Yolanda Flores is a 37 y.o. female.  HPI 37 year old female presents with vomiting.  For just over a week she has had concerned that she is having a respiratory illness such as COVID.  She states she checked herself multiple times at home but it was negative.  She has had cough, sore throat, congestion and bodyaches.  Over the past few days she has had decreased appetite and due to this has not been checking her glucose or taking her metformin or insulin.  Yesterday started vomiting and she has been vomiting for nearly 24 hours.  No hematemesis.  No diarrhea or urinary symptoms.  Just got off her menstrual cycle.  Her abdomen feels a little sore from vomiting but she has had pancreatitis before and this does not feel similar.  She feels like over the last 24 hours she has also developed shortness of breath, especially with any minimal activity.  Does not have a fever.  The cough and sore throat is better but she still having the body aches and congestion.  Home Medications Prior to Admission medications   Medication Sig Start Date End Date Taking? Authorizing Provider  aspirin-acetaminophen-caffeine (EXCEDRIN MIGRAINE) [phone] MG tablet Take 1-2 tablets by mouth every 6 (six) hours as needed for headache.   Yes [provider]  atorvastatin (LIPITOR) 40 MG tablet Take 1 tablet (40 mg total) by mouth daily. 07/16/22 12/12/22 Yes McClung, Marzella Schlein, PA-C  Insulin Glargine w/ Trans Port (BASAGLAR TEMPO PEN) 100 UNIT/ML SOPN Inject 22 Units into the skin daily. 10/21/22  Yes Marcine Matar, MD  insulin lispro (HUMALOG KWIKPEN) 100 UNIT/ML KwikPen 3 units subcut inj with the 2 largest meals of the day. Patient taking differently: Inject 3 Units into the skin 2  (two) times daily. 10/28/22  Yes Marcine Matar, MD  lisinopril (ZESTRIL) 5 MG tablet Take 1 tablet (5 mg total) by mouth daily. 10/21/22  Yes Marcine Matar, MD  metFORMIN (GLUCOPHAGE) 1000 MG tablet Take 1 tablet (1,000 mg total) by mouth 2 (two) times daily with a meal. 07/16/22  Yes McClung, Angela M, PA-C  omeprazole (PRILOSEC) 20 MG capsule Take 1 capsule (20 mg total) by mouth daily. 10/21/22  Yes Marcine Matar, MD  pregabalin (LYRICA) 25 MG capsule Take 1 capsule (25 mg total) by mouth 2 (two) times daily. 10/21/22  Yes Marcine Matar, MD  thiamine (VITAMIN B1) 100 MG tablet Take 1 tablet (100 mg total) by mouth daily. 05/17/22  Yes Burnadette Pop, MD  Continuous Glucose Receiver (FREESTYLE LIBRE 3 READER) DEVI 1 Device by Does not apply route daily. 10/21/22   Marcine Matar, MD  Continuous Glucose Sensor (FREESTYLE LIBRE 3 SENSOR) MISC Change Q 2 weeks 10/21/22   Marcine Matar, MD  Multiple Vitamin (MULTIVITAMIN WITH MINERALS) TABS tablet Take 1 tablet by mouth daily. 01/18/19   Kathlen Mody, MD      Allergies    Ibuprofen    Review of Systems   Review of Systems  Constitutional:  Negative for fever.  HENT:  Positive for congestion and sore throat.   Respiratory:  Positive for cough and shortness of breath.   Cardiovascular:  Negative for chest pain.  Gastrointestinal:  Positive for nausea and vomiting. Negative for abdominal pain and diarrhea.  Musculoskeletal:  Positive for myalgias.    Physical Exam Updated Vital Signs BP (!) 137/107 (BP Location: Left Arm)   Pulse (!) 112   Temp 98.7 F (37.1 C) (Oral)   Resp 13   LMP 12/08/2022   SpO2 97%  Physical Exam Vitals and nursing note reviewed.  Constitutional:      General: She is not in acute distress.    Appearance: She is well-developed. She is not ill-appearing or diaphoretic.  HENT:     Head: Normocephalic and atraumatic.     Mouth/Throat:     Mouth: Mucous membranes are dry.     Pharynx:  Oropharynx is clear.  Cardiovascular:     Rate and Rhythm: Regular rhythm. Tachycardia present.     Heart sounds: Normal heart sounds.  Pulmonary:     Effort: Pulmonary effort is normal.     Breath sounds: Normal breath sounds. No wheezing or rales.  Abdominal:     General: There is no distension.     Palpations: Abdomen is soft.     Tenderness: There is no abdominal tenderness.  Skin:    General: Skin is warm and dry.  Neurological:     Mental Status: She is alert.     ED Results / Procedures / Treatments   Labs (all labs ordered are listed, but only abnormal results are displayed) Labs Reviewed  CBC WITH DIFFERENTIAL/PLATELET - Abnormal; Notable for the following components:      Result Value   Platelets 481 (*)    Basophils Absolute 0.2 (*)    All other components within normal limits  COMPREHENSIVE METABOLIC PANEL - Abnormal; Notable for the following components:   Chloride 86 (*)    CO2 15 (*)    Glucose, Bld 160 (*)    BUN 21 (*)    Creatinine, Ser 1.20 (*)    Total Protein 9.4 (*)    AST 122 (*)    ALT 85 (*)    Total Bilirubin 2.0 (*)    Anion gap 34 (*)    All other components within normal limits  BLOOD GAS, VENOUS - Abnormal; Notable for the following components:   pCO2, Ven 31 (*)    pO2, Ven 54 (*)    Bicarbonate 19.2 (*)    Acid-base deficit 4.5 (*)    All other components within normal limits  BETA-HYDROXYBUTYRIC ACID - Abnormal; Notable for the following components:   Beta-Hydroxybutyric Acid >8.00 (*)    All other components within normal limits  MAGNESIUM - Abnormal; Notable for the following components:   Magnesium 1.6 (*)    All other components within normal limits  ETHANOL - Abnormal; Notable for the following components:   Alcohol, Ethyl (B) 29 (*)    All other components within normal limits  CBG MONITORING, ED - Abnormal; Notable for the following components:   Glucose-Capillary 155 (*)    All other components within normal limits   RESP PANEL BY RT-PCR (RSV, FLU A&B, COVID)  RVPGX2  LIPASE, BLOOD  CK  URINALYSIS, W/ REFLEX TO CULTURE (INFECTION SUSPECTED)  LACTIC ACID, PLASMA  LACTIC ACID, PLASMA  HCG, SERUM, QUALITATIVE  TROPONIN I (HIGH SENSITIVITY)  TROPONIN I (HIGH SENSITIVITY)    EKG None  Radiology DG Chest Portable 1 View  Result Date: 12/12/2022 CLINICAL DATA:  Cough.  Body aches. EXAM: PORTABLE CHEST 1 VIEW COMPARISON:  02/28/2022. FINDINGS: Bilateral lung fields are clear. Bilateral  costophrenic angles are clear. Normal cardio-mediastinal silhouette. No acute osseous abnormalities. The soft tissues are within normal limits. IMPRESSION: No active disease. Electronically Signed   By: Jules Schick M.D.   On: 12/12/2022 11:28    Procedures .Critical Care  Performed by: Pricilla Loveless, MD Authorized by: Pricilla Loveless, MD   Critical care provider statement:    Critical care time (minutes):  30   Critical care time was exclusive of:  Separately billable procedures and treating other patients   Critical care was necessary to treat or prevent imminent or life-threatening deterioration of the following conditions:  Metabolic crisis   Critical care was time spent personally by me on the following activities:  Development of treatment plan with patient or surrogate, discussions with consultants, evaluation of patient's response to treatment, examination of patient, ordering and review of laboratory studies, ordering and review of radiographic studies, ordering and performing treatments and interventions, pulse oximetry, re-evaluation of patient's condition and review of old charts     Medications Ordered in ED Medications  dextrose 5 %-0.9 % sodium chloride infusion ( Intravenous New Bag/Given 12/12/22 1359)  enoxaparin (LOVENOX) injection 40 mg (has no administration in time range)  acetaminophen (TYLENOL) tablet 650 mg (has no administration in time range)    Or  acetaminophen (TYLENOL) suppository  650 mg (has no administration in time range)  prochlorperazine (COMPAZINE) injection 10 mg (has no administration in time range)  insulin aspart (novoLOG) injection 0-15 Units (has no administration in time range)  lactated ringers bolus 1,000 mL (0 mLs Intravenous Stopped 12/12/22 1333)  ondansetron (ZOFRAN) injection 4 mg (4 mg Intravenous Given 12/12/22 1153)  ketorolac (TORADOL) 15 MG/ML injection 15 mg (15 mg Intravenous Given 12/12/22 1357)  alum & mag hydroxide-simeth (MAALOX/MYLANTA) 200-200-20 MG/5ML suspension 30 mL (30 mLs Oral Given 12/12/22 1357)  thiamine (VITAMIN B1) injection 100 mg (100 mg Intravenous Given 12/12/22 1359)  dextrose 50 % solution 25 g (25 g Intravenous Given 12/12/22 1359)  insulin aspart (novoLOG) injection 8 Units (8 Units Subcutaneous Given 12/12/22 1359)    ED Course/ Medical Decision Making/ A&P                                 Medical Decision Making Amount and/or Complexity of Data Reviewed Labs: ordered.    Details: Metabolic acidosis.  Significantly elevated beta hydroxybutyrate. Radiology: ordered and independent interpretation performed.    Details: No pneumothorax or pneumonia ECG/medicine tests: ordered and independent interpretation performed.    Details: Sinus tachycardia.  Risk OTC drugs. Prescription drug management. Decision regarding hospitalization.   Patient presents with vomiting and overall not feeling well.  She is tachycardic but not hypotensive.  Given fluids and Zofran.  There is no evidence of DKA but she does have a significant metabolic acidosis.  Chart review shows this is similar to other presentations that seem to be due to alcoholic ketoacidosis.  She was given IV fluids and I discussed with Dr. Robb Matar we will also put her on glucose.  She was given IV thiamine.  She has a benign abdominal exam and I do not think has an occult bacterial infection or need for antibiotics.  Do not think further imaging will be helpful.   Will admit to the hospitalist for further treatment and care.        Final Clinical Impression(s) / ED Diagnoses Final diagnoses:  Alcoholic ketoacidosis    Rx / DC Orders  ED Discharge Orders     None         Pricilla Loveless, MD 12/12/22 1513

## 2022-12-12 NOTE — Progress Notes (Signed)
Critical lactic acid 2.4, which is consistent with and improved since previous value. Will follow up with next value.

## 2022-12-13 DIAGNOSIS — E8729 Other acidosis: Secondary | ICD-10-CM | POA: Diagnosis not present

## 2022-12-13 LAB — CBC
HCT: 31.8 % — ABNORMAL LOW (ref 36.0–46.0)
Hemoglobin: 10.4 g/dL — ABNORMAL LOW (ref 12.0–15.0)
MCH: 28 pg (ref 26.0–34.0)
MCHC: 32.7 g/dL (ref 30.0–36.0)
MCV: 85.7 fL (ref 80.0–100.0)
Platelets: 290 10*3/uL (ref 150–400)
RBC: 3.71 MIL/uL — ABNORMAL LOW (ref 3.87–5.11)
RDW: 15.1 % (ref 11.5–15.5)
WBC: 4.4 10*3/uL (ref 4.0–10.5)
nRBC: 0 % (ref 0.0–0.2)

## 2022-12-13 LAB — GLUCOSE, CAPILLARY
Glucose-Capillary: 101 mg/dL — ABNORMAL HIGH (ref 70–99)
Glucose-Capillary: 139 mg/dL — ABNORMAL HIGH (ref 70–99)
Glucose-Capillary: 143 mg/dL — ABNORMAL HIGH (ref 70–99)
Glucose-Capillary: 79 mg/dL (ref 70–99)

## 2022-12-13 LAB — COMPREHENSIVE METABOLIC PANEL
ALT: 55 U/L — ABNORMAL HIGH (ref 0–44)
AST: 71 U/L — ABNORMAL HIGH (ref 15–41)
Albumin: 3.6 g/dL (ref 3.5–5.0)
Alkaline Phosphatase: 55 U/L (ref 38–126)
Anion gap: 8 (ref 5–15)
BUN: 17 mg/dL (ref 6–20)
CO2: 31 mmol/L (ref 22–32)
Calcium: 8 mg/dL — ABNORMAL LOW (ref 8.9–10.3)
Chloride: 94 mmol/L — ABNORMAL LOW (ref 98–111)
Creatinine, Ser: 0.99 mg/dL (ref 0.44–1.00)
GFR, Estimated: >60 mL/min (ref >60)
Glucose, Bld: 106 mg/dL — ABNORMAL HIGH (ref 70–99)
Potassium: 4 mmol/L (ref 3.5–5.1)
Sodium: 133 mmol/L — ABNORMAL LOW (ref 135–145)
Total Bilirubin: 1.8 mg/dL — ABNORMAL HIGH (ref <1.2)
Total Protein: 6.8 g/dL (ref 6.5–8.1)

## 2022-12-13 LAB — HIV ANTIBODY (ROUTINE TESTING W REFLEX): HIV Screen 4th Generation wRfx: NONREACTIVE
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
## 2022-12-13 NOTE — Discharge Summary (Signed)
Physician Discharge Summary   Patient: Yolanda Flores MRN: [id number] DOB: April 02, 1985  Admit date:     12/12/2022  Discharge date: 12/13/22  Discharge Physician: Alberteen Sam   PCP: Marcine Matar, MD     Recommendations at discharge:  Follow-up with PCP, Dr. Laural Benes for alcoholic ketosis Dr. Laural Benes: Please resume metformin as appropriate     Discharge Diagnoses: Principal Problem:   Alcoholic ketoacidosis Active Problems:   High anion gap metabolic acidosis   Insulin dependent type 2 diabetes mellitus (HCC)   Hypertension associated with diabetes (HCC)   Alcohol abuse   Class 1 obesity   Abnormal LFTs   Thrombocytosis   Hypomagnesemia   Lactic acidosis   Alcoholic hepatitis     Hospital Course: 37 y.o. F with DM, HTN, obesity, HLD, and alcohol use who presented with nausea and vomiting in the setting of 1 week heavy alcohol binge.  In the ER, noted to have ketoacidosis without hyperglycemia.  Also mild alcoholic hepatitis.  She was admitted, treated with IV fluids and antiemetics.  The acidosis resolved overnight, she felt well and was able to tolerate oral diet, and was discharged home with close follow-up.  She was provided with resources for alcohol cessation.  She needed no refills of her medications.             The [facility] Controlled Substances Registry was reviewed for this patient prior to discharge.  Disposition: Home Diet recommendation:  Carb modified diet  DISCHARGE MEDICATION: Allergies as of 12/13/2022       Reactions   Ibuprofen Nausea And Vomiting        Medication List     TAKE these medications    aspirin-acetaminophen-caffeine 250-250-65 MG tablet Commonly known as: EXCEDRIN MIGRAINE Take 1-2 tablets by mouth every 6 (six) hours as needed for headache.   atorvastatin 40 MG tablet Commonly known as: LIPITOR Take 1 tablet (40 mg total) by mouth daily.   Basaglar Tempo Pen 100 UNIT/ML  Sopn Generic drug: Insulin Glargine w/ Trans Port Inject 22 Units into the skin daily.   FreeStyle Libre 3 Reader Devi 1 Device by Does not apply route daily.   FreeStyle Libre 3 Sensor Misc Change Q 2 weeks   insulin lispro 100 UNIT/ML KwikPen Commonly known as: HumaLOG KwikPen 3 units subcut inj with the 2 largest meals of the day. What changed:  how much to take how to take this when to take this additional instructions   lisinopril 5 MG tablet Commonly known as: ZESTRIL Take 1 tablet (5 mg total) by mouth daily.   metFORMIN 1000 MG tablet Commonly known as: GLUCOPHAGE Take 1 tablet (1,000 mg total) by mouth 2 (two) times daily with a meal.   multivitamin with minerals Tabs tablet Take 1 tablet by mouth daily.   omeprazole 20 MG capsule Commonly known as: PRILOSEC Take 1 capsule (20 mg total) by mouth daily.   pregabalin 25 MG capsule Commonly known as: Lyrica Take 1 capsule (25 mg total) by mouth 2 (two) times daily.   thiamine 100 MG tablet Commonly known as: VITAMIN B1 Take 1 tablet (100 mg total) by mouth daily.        Follow-up Information     Marcine Matar, MD. Schedule an appointment as soon as possible for a visit in 1 week(s).   Specialty: Internal Medicine Contact information: [address] [place] [address] [phone]  Discharge Instructions     Discharge instructions   Complete by: As directed    **IMPORTANT DISCHARGE INSTRUCTIONS**   From Dr. Maryfrances Bunnell: You were admitted for ketoacidosis  You were treated with fluids and this resolved  For the next week, HOLD (do not take) your metformin  Continue all of your other home medicines  Eat bland foods, small meals, avoid fatty foods or spicy foods  Take omeprazole (acid supressing medicine) once daily  YOu can ADD famotidine/Pepcid 10 or 20 mg once daily as well if you'd like, this can also help the stomach heal You may also take Maalox  or Mylanta, (but as we talked about, it won't contain the lidocaine part, so it might not have the same effect)(lidocaine you don't want to take long term though)  Stop alcohol  Go see Dr. Laural Benes in 1 week   Increase activity slowly   Complete by: As directed        Discharge Exam: Filed Weights   12/12/22 1708 12/13/22 0300  Weight: 96.7 kg 95.9 kg    General:  Pt is alert, awake, not in acute distress Cardiovascular: RRR, nl S1-S2, no murmurs appreciated.   No LE edema.   Respiratory: Normal respiratory rate and rhythm.  CTAB without rales or wheezes. Abdominal: Abdomen soft and non-tender.  No distension or HSM.   Neuro/Psych: Strength symmetric in upper and lower extremities.  Judgment and insight appear normal.   Condition at discharge: good  The results of significant diagnostics from this hospitalization (including imaging, microbiology, ancillary and laboratory) are listed below for reference.   Imaging Studies: DG Chest Portable 1 View  Result Date: 12/12/2022 CLINICAL DATA:  Cough.  Body aches. EXAM: PORTABLE CHEST 1 VIEW COMPARISON:  02/28/2022. FINDINGS: Bilateral lung fields are clear. Bilateral costophrenic angles are clear. Normal cardio-mediastinal silhouette. No acute osseous abnormalities. The soft tissues are within normal limits. IMPRESSION: No active disease. Electronically Signed   By: Jules Schick M.D.   On: 12/12/2022 11:28    Microbiology: Results for orders placed or performed during the hospital encounter of 12/12/22  Resp panel by RT-PCR (RSV, Flu A&B, Covid) Anterior Nasal Swab     Status: None   Collection Time: 12/12/22 11:02 AM   Specimen: Anterior Nasal Swab  Result Value Ref Range Status   SARS Coronavirus 2 by RT PCR NEGATIVE NEGATIVE Final    Comment: (NOTE) SARS-CoV-2 target nucleic acids are NOT DETECTED.  The SARS-CoV-2 RNA is generally detectable in upper respiratory specimens during the acute phase of infection. The  lowest concentration of SARS-CoV-2 viral copies this assay can detect is 138 copies/mL. A negative result does not preclude SARS-Cov-2 infection and should not be used as the sole basis for treatment or other patient management decisions. A negative result may occur with  improper specimen collection/handling, submission of specimen other than nasopharyngeal swab, presence of viral mutation(s) within the areas targeted by this assay, and inadequate number of viral copies(<138 copies/mL). A negative result must be combined with clinical observations, patient history, and epidemiological information. The expected result is Negative.  Fact Sheet for Patients:  BloggerCourse.com  Fact Sheet for Healthcare Providers:  SeriousBroker.it  This test is no t yet approved or cleared by the [place] FDA and  has been authorized for detection and/or diagnosis of SARS-CoV-2 by FDA under an Emergency Use Authorization (EUA). This EUA will remain  in effect (meaning this test can be used) for the duration of the COVID-19 declaration under Section  564(b)(1) of the Act, 21 U.S.C.section 360bbb-3(b)(1), unless the authorization is terminated  or revoked sooner.       Influenza A by PCR NEGATIVE NEGATIVE Final   Influenza B by PCR NEGATIVE NEGATIVE Final    Comment: (NOTE) The Xpert Xpress SARS-CoV-2/FLU/RSV plus assay is intended as an aid in the diagnosis of influenza from Nasopharyngeal swab specimens and should not be used as a sole basis for treatment. Nasal washings and aspirates are unacceptable for Xpert Xpress SARS-CoV-2/FLU/RSV testing.  Fact Sheet for Patients: BloggerCourse.com  Fact Sheet for Healthcare Providers: SeriousBroker.it  This test is not yet approved or cleared by the [place] FDA and has been authorized for detection and/or diagnosis of SARS-CoV-2 by FDA under  an Emergency Use Authorization (EUA). This EUA will remain in effect (meaning this test can be used) for the duration of the COVID-19 declaration under Section 564(b)(1) of the Act, 21 U.S.C. section 360bbb-3(b)(1), unless the authorization is terminated or revoked.     Resp Syncytial Virus by PCR NEGATIVE NEGATIVE Final    Comment: (NOTE) Fact Sheet for Patients: BloggerCourse.com  Fact Sheet for Healthcare Providers: SeriousBroker.it  This test is not yet approved or cleared by the [place] FDA and has been authorized for detection and/or diagnosis of SARS-CoV-2 by FDA under an Emergency Use Authorization (EUA). This EUA will remain in effect (meaning this test can be used) for the duration of the COVID-19 declaration under Section 564(b)(1) of the Act, 21 U.S.C. section 360bbb-3(b)(1), unless the authorization is terminated or revoked.  Performed at [facility], [address] [place], [place] [address]   MRSA Next Gen by PCR, Nasal     Status: None   Collection Time: 12/12/22  5:21 PM   Specimen: Nasal Mucosa; Nasal Swab  Result Value Ref Range Status   MRSA by PCR Next Gen NOT DETECTED NOT DETECTED Final    Comment: (NOTE) The GeneXpert MRSA Assay (FDA approved for NASAL specimens only), is one component of a comprehensive MRSA colonization surveillance program. It is not intended to diagnose MRSA infection nor to guide or monitor treatment for MRSA infections. Test performance is not FDA approved in patients less than 17 years old. Performed at [facility], [address], [place], [place] [address]     Labs: CBC: Recent Labs  Lab 12/12/22 1101 12/13/22 0319  WBC 8.5 4.4  NEUTROABS 6.8  --   HGB 13.3 10.4*  HCT 38.3 31.8*  MCV 83.6 85.7  PLT 481* 290   Basic Metabolic Panel: Recent Labs  Lab 12/12/22 1101 12/12/22 1701 12/12/22 2108 12/13/22 0319  NA 135  131* 131* 133*  K 4.6 4.4 4.0 4.0  CL 86* 88* 90* 94*  CO2 15* 23 29 31   GLUCOSE 160* 191* 173* 106*  BUN 21* 20 20 17   CREATININE 1.20* 1.14* 1.00 0.99  CALCIUM 8.9 8.3* 7.8* 8.0*  MG 1.6*  --   --   --    Liver Function Tests: Recent Labs  Lab 12/12/22 1101 12/13/22 0319  AST 122* 71*  ALT 85* 55*  ALKPHOS 82 55  BILITOT 2.0* 1.8*  PROT 9.4* 6.8  ALBUMIN 4.8 3.6   CBG: Recent Labs  Lab 12/12/22 1953 12/13/22 0001 12/13/22 0318 12/13/22 0804 12/13/22 1107  GLUCAP 240* 79 101* 143* 139*    Discharge time spent: approximately 35 minutes spent on discharge counseling, evaluation of patient on day of discharge, and coordination of discharge planning with nursing, social  work, pharmacy and case management  Signed: Alberteen Sam, MD [facility] 12/13/2022

## 2022-12-15 ENCOUNTER — Telehealth: Payer: Self-pay

## 2022-12-15 NOTE — Transitions of Care (Post Inpatient/ED Visit) (Signed)
   12/15/2022  Name: Yolanda Flores MRN: [id number] DOB: 05-Mar-1985  Today's TOC FU Call Status: Unsuccessful Call (1st Attempt) Date: 12/15/22  Attempted to reach the patient regarding the most recent Inpatient/ED visit.  Follow Up Plan: Additional outreach attempts will be made to reach the patient to complete the Transitions of Care (Post Inpatient/ED visit) call.   Signature  Robyne Peers, RN

## 2022-12-16 ENCOUNTER — Telehealth: Payer: Self-pay

## 2022-12-16 NOTE — Transitions of Care (Post Inpatient/ED Visit) (Signed)
   12/16/2022  Name: Yolanda Flores MRN: [id number] DOB: December 07, 1985  Today's TOC FU Call Status: Today's TOC FU Call Status:: Unsuccessful Call (2nd Attempt) Unsuccessful Call (1st Attempt) Date: 12/15/22 Unsuccessful Call (2nd Attempt) Date: 12/16/22  Attempted to reach the patient regarding the most recent Inpatient/ED visit.  Follow Up Plan: Additional outreach attempts will be made to reach the patient to complete the Transitions of Care (Post Inpatient/ED visit) call.   Signature  Robyne Peers, RN

## 2022-12-16 NOTE — Telephone Encounter (Signed)
Patient returning [name] call. Please f/u with patient

## 2022-12-16 NOTE — Telephone Encounter (Signed)
Call returned to patient, message left with call back requested.  

## 2022-12-17 ENCOUNTER — Telehealth: Payer: Self-pay

## 2022-12-17 NOTE — Transitions of Care (Post Inpatient/ED Visit) (Signed)
   12/17/2022  Name: Yolanda Flores MRN: [id number] DOB: 1985-09-13  Today's TOC FU Call Status: Today's TOC FU Call Status:: Unsuccessful Call (3rd Attempt) Unsuccessful Call (1st Attempt) Date: 12/15/22 Unsuccessful Call (2nd Attempt) Date: 12/16/22 Unsuccessful Call (3rd Attempt) Date: 12/17/22  Attempted to reach the patient regarding the most recent Inpatient/ED visit.  Follow Up Plan: No further outreach attempts will be made at this time. We have been unable to contact the patient.  Letter sent to patient requesting she contact [facility] to schedule a follow up appointment as we have not been able to reach her.   Signature  Robyne Peers, RN

## 2022-12-19 ENCOUNTER — Telehealth: Payer: Self-pay | Admitting: Internal Medicine

## 2022-12-19 NOTE — Telephone Encounter (Signed)
Copied from CRM [id number]. Topic: General - Other >> Dec 19, 2022 10:45 AM Yolanda Flores wrote: Reason for CRM: The patient has returned a missed phone call from J. Brazeau   Please contact when possible

## 2022-12-22 NOTE — Telephone Encounter (Signed)
I spoke to the patient and explained to her that I was calling to follow from her hospitalization. She said she is feeling much better and needed to schedule a follow up appointment. I explained that there are not any HFU appointments available here at [facility] until after 01/14/2023.  I then explained that he have a MMU in the community and she could be seen there this week, no appointment is needed.  I told her that the MMU will be at [facility] [address] on Wed 12/24/2022 and she said she will go there.  I told her that they can schedule  her next follow up appointment when she completes that appointment  I provided her with the address and time of the [facility] clinic on Wed.

## 2023-01-08 ENCOUNTER — Other Ambulatory Visit: Payer: Self-pay

## 2023-01-08 ENCOUNTER — Emergency Department (HOSPITAL_COMMUNITY)
Admission: EM | Admit: 2023-01-08 | Discharge: 2023-01-08 | Disposition: A | Discharge status: Home / Self Care | Payer: Commercial Managed Care - HMO | Attending: Emergency Medicine | Admitting: Emergency Medicine

## 2023-01-08 ENCOUNTER — Encounter (HOSPITAL_COMMUNITY): Payer: Self-pay

## 2023-01-08 DIAGNOSIS — R112 Nausea with vomiting, unspecified: Secondary | ICD-10-CM | POA: Diagnosis present

## 2023-01-08 DIAGNOSIS — Z794 Long term (current) use of insulin: Secondary | ICD-10-CM | POA: Diagnosis not present

## 2023-01-08 DIAGNOSIS — Z79899 Other long term (current) drug therapy: Secondary | ICD-10-CM | POA: Insufficient documentation

## 2023-01-08 DIAGNOSIS — R0602 Shortness of breath: Secondary | ICD-10-CM | POA: Diagnosis not present

## 2023-01-08 DIAGNOSIS — E119 Type 2 diabetes mellitus without complications: Secondary | ICD-10-CM | POA: Insufficient documentation

## 2023-01-08 DIAGNOSIS — Z7984 Long term (current) use of oral hypoglycemic drugs: Secondary | ICD-10-CM | POA: Diagnosis not present

## 2023-01-08 DIAGNOSIS — Z7982 Long term (current) use of aspirin: Secondary | ICD-10-CM | POA: Diagnosis not present

## 2023-01-08 DIAGNOSIS — I10 Essential (primary) hypertension: Secondary | ICD-10-CM | POA: Diagnosis not present

## 2023-01-08 LAB — CBC WITH DIFFERENTIAL/PLATELET
Abs Immature Granulocytes: 0.04 10*3/uL (ref 0.00–0.07)
Basophils Absolute: 0.1 10*3/uL (ref 0.0–0.1)
Basophils Relative: 1 %
Eosinophils Absolute: 0.1 10*3/uL (ref 0.0–0.5)
Eosinophils Relative: 1 %
HCT: 37.7 % (ref 36.0–46.0)
Hemoglobin: 12.6 g/dL (ref 12.0–15.0)
Immature Granulocytes: 0 %
Lymphocytes Relative: 13 %
Lymphs Abs: 1.6 10*3/uL (ref 0.7–4.0)
MCH: 28.5 pg (ref 26.0–34.0)
MCHC: 33.4 g/dL (ref 30.0–36.0)
MCV: 85.3 fL (ref 80.0–100.0)
Monocytes Absolute: 0.8 10*3/uL (ref 0.1–1.0)
Monocytes Relative: 6 %
Neutro Abs: 10.1 10*3/uL — ABNORMAL HIGH (ref 1.7–7.7)
Neutrophils Relative %: 79 %
Platelets: 626 10*3/uL — ABNORMAL HIGH (ref 150–400)
RBC: 4.42 MIL/uL (ref 3.87–5.11)
RDW: 16 % — ABNORMAL HIGH (ref 11.5–15.5)
WBC: 12.8 10*3/uL — ABNORMAL HIGH (ref 4.0–10.5)
nRBC: 0 % (ref 0.0–0.2)

## 2023-01-08 LAB — COMPREHENSIVE METABOLIC PANEL
ALT: 21 U/L (ref 0–44)
AST: 27 U/L (ref 15–41)
Albumin: 4.6 g/dL (ref 3.5–5.0)
Alkaline Phosphatase: 87 U/L (ref 38–126)
Anion gap: 15 (ref 5–15)
BUN: 11 mg/dL (ref 6–20)
CO2: 23 mmol/L (ref 22–32)
Calcium: 9.2 mg/dL (ref 8.9–10.3)
Chloride: 95 mmol/L — ABNORMAL LOW (ref 98–111)
Creatinine, Ser: 0.83 mg/dL (ref 0.44–1.00)
GFR, Estimated: >60 mL/min (ref >60)
Glucose, Bld: 197 mg/dL — ABNORMAL HIGH (ref 70–99)
Potassium: 4 mmol/L (ref 3.5–5.1)
Sodium: 133 mmol/L — ABNORMAL LOW (ref 135–145)
Total Bilirubin: 1.1 mg/dL (ref <1.2)
Total Protein: 8.8 g/dL — ABNORMAL HIGH (ref 6.5–8.1)

## 2023-01-08 LAB — LIPASE, BLOOD: Lipase: 20 U/L (ref 11–51)

## 2023-01-08 LAB — CBG MONITORING, ED: Glucose-Capillary: 188 mg/dL — ABNORMAL HIGH (ref 70–99)

## 2023-01-08 LAB — ETHANOL: Alcohol, Ethyl (B): <10 mg/dL (ref <10)

## 2023-01-08 LAB — MAGNESIUM: Magnesium: 1.2 mg/dL — ABNORMAL LOW (ref 1.7–2.4)

## 2023-01-08 MED ORDER — LORAZEPAM 2 MG/ML IJ SOLN
1.0000 mg | Freq: Once | INTRAMUSCULAR | Status: AC
  Administered 2023-01-08: 1 mg via INTRAVENOUS
  Filled 2023-01-08: qty 1

## 2023-01-08 MED ORDER — ONDANSETRON HCL 4 MG/2ML IJ SOLN
4.0000 mg | Freq: Once | INTRAMUSCULAR | Status: AC
  Administered 2023-01-08: 4 mg via INTRAVENOUS
  Filled 2023-01-08: qty 2

## 2023-01-08 MED ORDER — LACTATED RINGERS IV BOLUS
1000.0000 mL | Freq: Once | INTRAVENOUS | Status: AC
  Administered 2023-01-08: 1000 mL via INTRAVENOUS

## 2023-01-08 MED ORDER — ONDANSETRON 4 MG PO TBDP: 4.0000 mg | ORAL_TABLET | Freq: Three times a day (TID) | ORAL | 0 refills | Status: DC | PRN

## 2023-01-08 MED ORDER — FAMOTIDINE IN NACL 20-0.9 MG/50ML-% IV SOLN
20.0000 mg | INTRAVENOUS | Status: AC
  Administered 2023-01-08: 20 mg via INTRAVENOUS
  Filled 2023-01-08: qty 50

## 2023-01-08 MED ORDER — PANTOPRAZOLE SODIUM 40 MG IV SOLR
40.0000 mg | Freq: Once | INTRAVENOUS | Status: AC
  Administered 2023-01-08: 40 mg via INTRAVENOUS
  Filled 2023-01-08: qty 10

## 2023-01-08 MED ORDER — MAGNESIUM SULFATE 2 GM/50ML IV SOLN
2.0000 g | INTRAVENOUS | Status: AC
  Administered 2023-01-08: 2 g via INTRAVENOUS
  Filled 2023-01-08: qty 50

## 2023-01-08 NOTE — Discharge Instructions (Addendum)
Your symptoms improved from the emergency department.  Clinic concerning symptoms return to the emergency department.  Otherwise nausea medication sent into pharmacy.  Follow-up with primary care provider to have your magnesium level rechecked to ensure that it is functional pain.

## 2023-01-08 NOTE — ED Triage Notes (Signed)
Pt came in w/ c/o weakness, SOB, and right arm tightness for about a week and half. Pt also stated she has vomited a couple times today.

## 2023-01-08 NOTE — ED Provider Notes (Signed)
[place] EMERGENCY DEPARTMENT AT [facility] Provider Note   CSN: [id number] Arrival date & time: 01/08/23  9562     History  Chief Complaint  Patient presents with   Shortness of Breath        Emesis    Yolanda Flores is a 37 y.o. female.  37 y/o female with hx of DM, HTN, obesity, HLD, and alcohol use presents for nausea and vomiting with fatigue and malaise. Reports ongoing symptoms x 10 days. Estimates vomiting x 10 today. Emesis NB/NB. She has remained compliant with her insulin despite her symptoms. Also noting some associated cramping and paresthesias in her RUE without extremity weakness. No fevers, abdominal pain, bowel changes, urinary symptoms. Last ETOH intake was yesterday afternoon, but vomited shortly after.   Shortness of Breath Associated symptoms: vomiting   Emesis      Home Medications Prior to Admission medications   Medication Sig Start Date End Date Taking? Authorizing Provider  aspirin-acetaminophen-caffeine (EXCEDRIN MIGRAINE) [phone] MG tablet Take 1-2 tablets by mouth every 6 (six) hours as needed for headache.    [provider]  atorvastatin (LIPITOR) 40 MG tablet Take 1 tablet (40 mg total) by mouth daily. 07/16/22 12/12/22  Anders Simmonds, PA-C  Continuous Glucose Receiver (FREESTYLE LIBRE 3 READER) DEVI 1 Device by Does not apply route daily. 10/21/22   Marcine Matar, MD  Continuous Glucose Sensor (FREESTYLE LIBRE 3 SENSOR) MISC Change Q 2 weeks 10/21/22   Marcine Matar, MD  Insulin Glargine w/ Trans Port (BASAGLAR TEMPO PEN) 100 UNIT/ML SOPN Inject 22 Units into the skin daily. 10/21/22   Marcine Matar, MD  insulin lispro (HUMALOG KWIKPEN) 100 UNIT/ML KwikPen 3 units subcut inj with the 2 largest meals of the day. Patient taking differently: Inject 3 Units into the skin 2 (two) times daily. 10/28/22   Marcine Matar, MD  lisinopril (ZESTRIL) 5 MG tablet Take 1 tablet (5 mg total) by mouth daily.  10/21/22   Marcine Matar, MD  metFORMIN (GLUCOPHAGE) 1000 MG tablet Take 1 tablet (1,000 mg total) by mouth 2 (two) times daily with a meal. 07/16/22   Anders Simmonds, PA-C  Multiple Vitamin (MULTIVITAMIN WITH MINERALS) TABS tablet Take 1 tablet by mouth daily. 01/18/19   Kathlen Mody, MD  omeprazole (PRILOSEC) 20 MG capsule Take 1 capsule (20 mg total) by mouth daily. 10/21/22   Marcine Matar, MD  pregabalin (LYRICA) 25 MG capsule Take 1 capsule (25 mg total) by mouth 2 (two) times daily. 10/21/22   Marcine Matar, MD  thiamine (VITAMIN B1) 100 MG tablet Take 1 tablet (100 mg total) by mouth daily. 05/17/22   Burnadette Pop, MD      Allergies    Ibuprofen    Review of Systems   Review of Systems  Respiratory:  Positive for shortness of breath.   Gastrointestinal:  Positive for vomiting.  Ten systems reviewed and are negative for acute change, except as noted in the HPI.    Physical Exam Updated Vital Signs BP (!) 140/113   Pulse (!) 101   Temp 99.3 F (37.4 C) (Oral)   Resp 19   Ht 5\' 7"  (1.702 m)   Wt 99.3 kg   LMP 12/08/2022   SpO2 99%   BMI 34.30 kg/m  Physical Exam Vitals and nursing note reviewed.  Constitutional:      General: She is not in acute distress.    Appearance: She is well-developed. She  is not diaphoretic.     Comments: Pale appearing, nontoxic.  HENT:     Head: Normocephalic and atraumatic.     Right Ear: External ear normal.     Left Ear: External ear normal.     Mouth/Throat:     Mouth: Mucous membranes are dry.  Eyes:     General: No scleral icterus.    Conjunctiva/sclera: Conjunctivae normal.  Cardiovascular:     Rate and Rhythm: Regular rhythm. Tachycardia present.     Pulses: Normal pulses.  Pulmonary:     Effort: Pulmonary effort is normal. No respiratory distress.     Breath sounds: No stridor. No wheezing.     Comments: Respirations even and unlabored Abdominal:     Palpations: Abdomen is soft.     Tenderness: There is no  abdominal tenderness. There is no guarding.     Comments: Nontender, nondistended.   Musculoskeletal:        General: Normal range of motion.     Cervical back: Normal range of motion.  Skin:    General: Skin is warm and dry.     Coloration: Skin is not pale.     Findings: No erythema or rash.  Neurological:     Mental Status: She is alert and oriented to person, place, and time.     Coordination: Coordination normal.  Psychiatric:        Behavior: Behavior normal.     ED Results / Procedures / Treatments   Labs (all labs ordered are listed, but only abnormal results are displayed) Labs Reviewed  CBC WITH DIFFERENTIAL/PLATELET - Abnormal; Notable for the following components:      Result Value   WBC 12.8 (*)    RDW 16.0 (*)    Platelets 626 (*)    Neutro Abs 10.1 (*)    All other components within normal limits  COMPREHENSIVE METABOLIC PANEL - Abnormal; Notable for the following components:   Sodium 133 (*)    Chloride 95 (*)    Glucose, Bld 197 (*)    Total Protein 8.8 (*)    All other components within normal limits  MAGNESIUM - Abnormal; Notable for the following components:   Magnesium 1.2 (*)    All other components within normal limits  CBG MONITORING, ED - Abnormal; Notable for the following components:   Glucose-Capillary 188 (*)    All other components within normal limits  LIPASE, BLOOD  ETHANOL    EKG EKG Interpretation Date/Time:  Thursday January 08 2023 04:47:39 EST Ventricular Rate:  131 PR Interval:  148 QRS Duration:  80 QT Interval:  289 QTC Calculation: 427 R Axis:   -62  Text Interpretation: Sinus tachycardia Consider right atrial enlargement Left anterior fascicular block No STEMI Confirmed by Drema Pry ([id number]) on 01/08/2023 6:15:53 AM  Radiology No results found.  Procedures Procedures    Medications Ordered in ED Medications  magnesium sulfate IVPB 2 g 50 mL (2 g Intravenous New Bag/Given 01/08/23 0637)  lactated ringers  bolus 1,000 mL (0 mLs Intravenous Stopped 01/08/23 0628)  LORazepam (ATIVAN) injection 1 mg (1 mg Intravenous Given 01/08/23 0520)  ondansetron (ZOFRAN) injection 4 mg (4 mg Intravenous Given 01/08/23 0520)  famotidine (PEPCID) IVPB 20 mg premix (0 mg Intravenous Stopped 01/08/23 0558)  pantoprazole (PROTONIX) injection 40 mg (40 mg Intravenous Given 01/08/23 0608)  lactated ringers bolus 1,000 mL (1,000 mLs Intravenous New Bag/Given 01/08/23 0636)    ED Course/ Medical Decision Making/ A&P Clinical Course as of  01/08/23 0643  Thu Jan 08, 2023  0619 WBC(!): 12.8 Suspected stress related, to ongoing vomiting. No fever or abdominal pain/tenderness to suggest infectious etiology. Electrolyte reassuring considering reported symptoms and past evaluations. No anion gap acidosis. Will replete magnesium. ETOH withdrawal remains a concern; however, will monitor vitals with IVF hydration. [KH]  [phone] Patient states her symptoms are improving. Discussed plan for IV magnesium, IVF [KH]    Clinical Course User Index [KH] Antony Madura, PA-C                                 Medical Decision Making Amount and/or Complexity of Data Reviewed Labs: ordered. Decision-making details documented in ED Course.  Risk Prescription drug management.   This patient presents to the ED for concern of N/V, this involves an extensive number of treatment options, and is a complaint that carries with it a high risk of complications and morbidity.  The differential diagnosis includes DKA vs alcoholic ketoacidosis vs PUD/gastritis vs pancreatitis vs pSBO/SBO   Co morbidities that complicate the patient evaluation  DM ETOH use   Additional history obtained:  Additional history obtained from partner, at bedside   Lab Tests:  I Ordered, and personally interpreted labs.  The pertinent results include:  WBC 12.8, NA 133, Cl 95, Mg 1.2. CBG 188. Liver and kidney function preserved   Cardiac Monitoring:  The  patient was maintained on a cardiac monitor.  I personally viewed and interpreted the cardiac monitored which showed an underlying rhythm of: sinus tachycardia   Medicines ordered and prescription drug management:  I ordered medication including Zofran, Ativan, Pepcid, and Protonix for nausea/vomiting.  Reevaluation of the patient after these medicines showed that the patient improved I have reviewed the patients home medicines and have made adjustments as needed   Test Considered:  Venous blood gas   Problem List / ED Course:  As above   Reevaluation:  After the interventions noted above, I reevaluated the patient and found that they have :improved   Social Determinants of Health:  Good social support   Dispostion:  Care signed out to [place], [place] at shift change.         Final Clinical Impression(s) / ED Diagnoses Final diagnoses:  Nausea and vomiting, unspecified vomiting type    Rx / DC Orders ED Discharge Orders     None         Antony Madura, PA-C 01/08/23 3244    Nira Conn, MD 01/08/23 [phone]

## 2023-01-08 NOTE — ED Provider Notes (Signed)
Signout received on this 37 year old female.  See previous note for full details.  Current plan is to discharge if symptoms remain improved Duda magnesium infusion.  Physical Exam  BP (!) 140/113   Pulse (!) 101   Temp 99.3 F (37.4 C) (Oral)   Resp 19   Ht 5\' 7"  (1.702 m)   Wt 99.3 kg   LMP 12/08/2022   SpO2 99%   BMI 34.30 kg/m     Procedures  Procedures  ED Course / MDM   Clinical Course as of 01/08/23 0751  Thu Jan 08, 2023  0619 WBC(!): 12.8 Suspected stress related, to ongoing vomiting. No fever or abdominal pain/tenderness to suggest infectious etiology. Electrolyte reassuring considering reported symptoms and past evaluations. No anion gap acidosis. Will replete magnesium. ETOH withdrawal remains a concern; however, will monitor vitals with IVF hydration. [KH]  [phone] Patient states her symptoms are improving. Discussed plan for IV magnesium, IVF [KH]    Clinical Course User Index [KH] Antony Madura, PA-C   Medical Decision Making Amount and/or Complexity of Data Reviewed Labs: ordered. Decision-making details documented in ED Course.  Risk Prescription drug management.   Notified by nurse that patient tolerated p.o. intake.  Asymptomatic at this time. Appears well on exam.  Agreeable with discharge with Zofran.       Marita [place], PA-C 01/08/23 6010    Rozelle Logan, DO 01/08/23 1029

## 2023-01-17 ENCOUNTER — Emergency Department (HOSPITAL_BASED_OUTPATIENT_CLINIC_OR_DEPARTMENT_OTHER): Payer: Commercial Managed Care - HMO

## 2023-01-17 ENCOUNTER — Encounter (HOSPITAL_BASED_OUTPATIENT_CLINIC_OR_DEPARTMENT_OTHER): Payer: Self-pay | Admitting: *Deleted

## 2023-01-17 ENCOUNTER — Inpatient Hospital Stay (HOSPITAL_BASED_OUTPATIENT_CLINIC_OR_DEPARTMENT_OTHER)
Admission: EM | Admit: 2023-01-17 | Discharge: 2023-01-20 | DRG: 641 | Disposition: A | Discharge status: Home / Self Care | Payer: Commercial Managed Care - HMO | Attending: Internal Medicine | Admitting: Internal Medicine

## 2023-01-17 ENCOUNTER — Other Ambulatory Visit: Payer: Self-pay

## 2023-01-17 DIAGNOSIS — K219 Gastro-esophageal reflux disease without esophagitis: Secondary | ICD-10-CM | POA: Diagnosis present

## 2023-01-17 DIAGNOSIS — Z888 Allergy status to other drugs, medicaments and biological substances status: Secondary | ICD-10-CM

## 2023-01-17 DIAGNOSIS — E119 Type 2 diabetes mellitus without complications: Secondary | ICD-10-CM

## 2023-01-17 DIAGNOSIS — E860 Dehydration: Principal | ICD-10-CM | POA: Diagnosis present

## 2023-01-17 DIAGNOSIS — F10139 Alcohol abuse with withdrawal, unspecified: Secondary | ICD-10-CM | POA: Diagnosis present

## 2023-01-17 DIAGNOSIS — Z6833 Body mass index (BMI) 33.0-33.9, adult: Secondary | ICD-10-CM

## 2023-01-17 DIAGNOSIS — Z79899 Other long term (current) drug therapy: Secondary | ICD-10-CM

## 2023-01-17 DIAGNOSIS — Z7984 Long term (current) use of oral hypoglycemic drugs: Secondary | ICD-10-CM

## 2023-01-17 DIAGNOSIS — R000 Tachycardia, unspecified: Secondary | ICD-10-CM

## 2023-01-17 DIAGNOSIS — E872 Acidosis, unspecified: Secondary | ICD-10-CM | POA: Diagnosis present

## 2023-01-17 DIAGNOSIS — E11649 Type 2 diabetes mellitus with hypoglycemia without coma: Secondary | ICD-10-CM | POA: Diagnosis present

## 2023-01-17 DIAGNOSIS — T730XXA Starvation, initial encounter: Secondary | ICD-10-CM | POA: Insufficient documentation

## 2023-01-17 DIAGNOSIS — E669 Obesity, unspecified: Secondary | ICD-10-CM | POA: Diagnosis present

## 2023-01-17 DIAGNOSIS — F10939 Alcohol use, unspecified with withdrawal, unspecified: Secondary | ICD-10-CM

## 2023-01-17 DIAGNOSIS — Z794 Long term (current) use of insulin: Secondary | ICD-10-CM

## 2023-01-17 DIAGNOSIS — Z87891 Personal history of nicotine dependence: Secondary | ICD-10-CM

## 2023-01-17 DIAGNOSIS — E8729 Other acidosis: Secondary | ICD-10-CM | POA: Insufficient documentation

## 2023-01-17 DIAGNOSIS — Z8249 Family history of ischemic heart disease and other diseases of the circulatory system: Secondary | ICD-10-CM

## 2023-01-17 DIAGNOSIS — E8889 Other specified metabolic disorders: Secondary | ICD-10-CM | POA: Diagnosis present

## 2023-01-17 DIAGNOSIS — R112 Nausea with vomiting, unspecified: Secondary | ICD-10-CM

## 2023-01-17 DIAGNOSIS — Z1152 Encounter for screening for COVID-19: Secondary | ICD-10-CM

## 2023-01-17 DIAGNOSIS — R519 Headache, unspecified: Secondary | ICD-10-CM

## 2023-01-17 DIAGNOSIS — E0781 Sick-euthyroid syndrome: Secondary | ICD-10-CM | POA: Diagnosis present

## 2023-01-17 DIAGNOSIS — I10 Essential (primary) hypertension: Secondary | ICD-10-CM | POA: Diagnosis present

## 2023-01-17 DIAGNOSIS — E861 Hypovolemia: Secondary | ICD-10-CM | POA: Diagnosis present

## 2023-01-17 DIAGNOSIS — K529 Noninfective gastroenteritis and colitis, unspecified: Secondary | ICD-10-CM | POA: Diagnosis present

## 2023-01-17 DIAGNOSIS — E785 Hyperlipidemia, unspecified: Secondary | ICD-10-CM | POA: Diagnosis present

## 2023-01-17 DIAGNOSIS — Z833 Family history of diabetes mellitus: Secondary | ICD-10-CM

## 2023-01-17 DIAGNOSIS — E162 Hypoglycemia, unspecified: Secondary | ICD-10-CM

## 2023-01-17 LAB — CBG MONITORING, ED
Glucose-Capillary: 76 mg/dL (ref 70–99)
Glucose-Capillary: 77 mg/dL (ref 70–99)
Glucose-Capillary: 104 mg/dL — ABNORMAL HIGH (ref 70–99)
Glucose-Capillary: 144 mg/dL — ABNORMAL HIGH (ref 70–99)
Glucose-Capillary: 102 mg/dL — ABNORMAL HIGH (ref 70–99)
Glucose-Capillary: 119 mg/dL — ABNORMAL HIGH (ref 70–99)
Glucose-Capillary: 125 mg/dL — ABNORMAL HIGH (ref 70–99)
Glucose-Capillary: 133 mg/dL — ABNORMAL HIGH (ref 70–99)
Glucose-Capillary: 139 mg/dL — ABNORMAL HIGH (ref 70–99)
Glucose-Capillary: 90 mg/dL (ref 70–99)

## 2023-01-17 LAB — URINALYSIS, ROUTINE W REFLEX MICROSCOPIC
Bacteria, UA: NONE SEEN
Bilirubin Urine: NEGATIVE
Glucose, UA: NEGATIVE mg/dL
Hgb urine dipstick: NEGATIVE
Ketones, ur: 40 mg/dL — AB
Leukocytes,Ua: NEGATIVE
Nitrite: NEGATIVE
Protein, ur: 30 mg/dL — AB
Specific Gravity, Urine: 1.016 (ref 1.005–1.030)
pH: 5.5 (ref 5.0–8.0)

## 2023-01-17 LAB — COMPREHENSIVE METABOLIC PANEL
ALT: 30 U/L (ref 0–44)
AST: 67 U/L — ABNORMAL HIGH (ref 15–41)
Albumin: 4.4 g/dL (ref 3.5–5.0)
Alkaline Phosphatase: 93 U/L (ref 38–126)
Anion gap: 26 — ABNORMAL HIGH (ref 5–15)
BUN: 13 mg/dL (ref 6–20)
CO2: 17 mmol/L — ABNORMAL LOW (ref 22–32)
Calcium: 9.4 mg/dL (ref 8.9–10.3)
Chloride: 94 mmol/L — ABNORMAL LOW (ref 98–111)
Creatinine, Ser: 0.79 mg/dL (ref 0.44–1.00)
GFR, Estimated: >60 mL/min (ref >60)
Glucose, Bld: 71 mg/dL (ref 70–99)
Potassium: 4.4 mmol/L (ref 3.5–5.1)
Sodium: 137 mmol/L (ref 135–145)
Total Bilirubin: 1.1 mg/dL (ref 0.0–1.2)
Total Protein: 8.4 g/dL — ABNORMAL HIGH (ref 6.5–8.1)

## 2023-01-17 LAB — CBC
HCT: 39 % (ref 36.0–46.0)
Hemoglobin: 13.3 g/dL (ref 12.0–15.0)
MCH: 28.4 pg (ref 26.0–34.0)
MCHC: 34.1 g/dL (ref 30.0–36.0)
MCV: 83.3 fL (ref 80.0–100.0)
Platelets: 434 10*3/uL — ABNORMAL HIGH (ref 150–400)
RBC: 4.68 MIL/uL (ref 3.87–5.11)
RDW: 17 % — ABNORMAL HIGH (ref 11.5–15.5)
WBC: 8.1 10*3/uL (ref 4.0–10.5)
nRBC: 0 % (ref 0.0–0.2)

## 2023-01-17 LAB — LACTIC ACID, PLASMA
Lactic Acid, Venous: 3.5 mmol/L (ref 0.5–1.9)
Lactic Acid, Venous: 2.8 mmol/L (ref 0.5–1.9)

## 2023-01-17 LAB — TSH: TSH: 0.215 u[IU]/mL — ABNORMAL LOW (ref 0.350–4.500)

## 2023-01-17 LAB — RESP PANEL BY RT-PCR (RSV, FLU A&B, COVID)  RVPGX2
Influenza A by PCR: NEGATIVE
Influenza B by PCR: NEGATIVE
Resp Syncytial Virus by PCR: NEGATIVE
SARS Coronavirus 2 by RT PCR: NEGATIVE

## 2023-01-17 LAB — LIPASE, BLOOD: Lipase: 10 U/L — ABNORMAL LOW (ref 11–51)

## 2023-01-17 LAB — PREGNANCY, URINE: Preg Test, Ur: NEGATIVE

## 2023-01-17 MED ORDER — FENTANYL CITRATE PF 50 MCG/ML IJ SOSY
50.0000 ug | PREFILLED_SYRINGE | Freq: Once | INTRAMUSCULAR | Status: AC
  Administered 2023-01-17: 50 ug via INTRAVENOUS
  Filled 2023-01-17: qty 1

## 2023-01-17 MED ORDER — SODIUM CHLORIDE 0.9 % IV BOLUS
1000.0000 mL | Freq: Once | INTRAVENOUS | Status: AC
  Administered 2023-01-17: 1000 mL via INTRAVENOUS

## 2023-01-17 MED ORDER — THIAMINE MONONITRATE 100 MG PO TABS
100.0000 mg | ORAL_TABLET | Freq: Every day | ORAL | Status: DC
  Administered 2023-01-17 – 2023-01-20 (×3): 100 mg via ORAL
  Filled 2023-01-17 (×3): qty 1

## 2023-01-17 MED ORDER — THIAMINE HCL 100 MG/ML IJ SOLN
100.0000 mg | Freq: Every day | INTRAMUSCULAR | Status: DC
  Administered 2023-01-19: 100 mg via INTRAVENOUS
  Filled 2023-01-17: qty 2

## 2023-01-17 MED ORDER — FOLIC ACID 1 MG PO TABS
1.0000 mg | ORAL_TABLET | Freq: Every day | ORAL | Status: DC
  Administered 2023-01-17 – 2023-01-20 (×4): 1 mg via ORAL
  Filled 2023-01-17 (×4): qty 1

## 2023-01-17 MED ORDER — ONDANSETRON HCL 4 MG/2ML IJ SOLN
4.0000 mg | Freq: Once | INTRAMUSCULAR | Status: AC
  Administered 2023-01-17: 4 mg via INTRAVENOUS
  Filled 2023-01-17: qty 2

## 2023-01-17 MED ORDER — LORAZEPAM 1 MG PO TABS
1.0000 mg | ORAL_TABLET | ORAL | Status: DC | PRN
  Administered 2023-01-17 – 2023-01-18 (×2): 1 mg via ORAL
  Filled 2023-01-17 (×2): qty 1

## 2023-01-17 MED ORDER — ALUM & MAG HYDROXIDE-SIMETH 200-200-20 MG/5ML PO SUSP
30.0000 mL | Freq: Once | ORAL | Status: AC
  Administered 2023-01-17: 30 mL via ORAL
  Filled 2023-01-17: qty 30

## 2023-01-17 MED ORDER — LORAZEPAM 2 MG/ML IJ SOLN
1.0000 mg | INTRAMUSCULAR | Status: DC | PRN
  Administered 2023-01-18 (×2): 1 mg via INTRAVENOUS
  Filled 2023-01-17 (×2): qty 1

## 2023-01-17 MED ORDER — LIDOCAINE VISCOUS HCL 2 % MT SOLN
15.0000 mL | Freq: Once | OROMUCOSAL | Status: AC
  Administered 2023-01-17: 15 mL via ORAL
  Filled 2023-01-17: qty 15

## 2023-01-17 MED ORDER — SODIUM CHLORIDE 0.9 % IV BOLUS
1000.0000 mL | Freq: Once | INTRAVENOUS | Status: AC
Start: 2023-01-17 — End: 2023-01-17
  Administered 2023-01-17: 1000 mL via INTRAVENOUS

## 2023-01-17 MED ORDER — ADULT MULTIVITAMIN W/MINERALS CH
1.0000 | ORAL_TABLET | Freq: Every day | ORAL | Status: DC
  Administered 2023-01-17 – 2023-01-20 (×4): 1 via ORAL
  Filled 2023-01-17 (×4): qty 1

## 2023-01-17 NOTE — ED Notes (Signed)
MD updating patient.

## 2023-01-17 NOTE — ED Notes (Signed)
 Orange juice drank by patient.

## 2023-01-17 NOTE — ED Provider Notes (Signed)
 [facility] Provider Note   CSN: [id number] Arrival date & time: 01/17/23  1522     History  Chief Complaint  Patient presents with   Hypoglycemia   Emesis    Yolanda Flores is a 38 y.o. female.  The history is provided by the patient, the spouse and medical records. No language interpreter was used.  Hypoglycemia Initial blood sugar:  37 Blood sugar after intervention:  107 Severity:  Severe Onset quality:  Gradual Duration:  1 day Timing:  Intermittent Progression:  Waxing and waning Chronicity:  New Diabetic status:  Controlled with insulin  Current diabetic therapy:  Glargine and lispro Time since last antidiabetic medication:  1 day Context comment:  Nausea vomiting diarrhea Relieved by:  Nothing Ineffective treatments:  None tried Associated symptoms: sweats, vomiting and weakness (generlaiuzed)   Associated symptoms: no altered mental status, no anxiety, no seizures and no shortness of breath   Emesis Associated symptoms: chills and diarrhea   Associated symptoms: no abdominal pain, no cough, no fever and no headaches        Home Medications Prior to Admission medications   Medication Sig Start Date End Date Taking? Authorizing Provider  aspirin -acetaminophen -caffeine  (EXCEDRIN  MIGRAINE) 250-250-65 MG tablet Take 1-2 tablets by mouth every 6 (six) hours as needed for headache.    [provider]  atorvastatin  (LIPITOR) 40 MG tablet Take 1 tablet (40 mg total) by mouth daily. 07/16/22 12/12/22  Danton Jon HERO, PA-C  Continuous Glucose Receiver (FREESTYLE LIBRE 3 READER) DEVI 1 Device by Does not apply route daily. 10/21/22   Vicci Barnie [name], MD  Continuous Glucose Sensor (FREESTYLE LIBRE 3 SENSOR) MISC Change Q 2 weeks 10/21/22   Vicci Barnie [name], MD  Insulin  Glargine w/ Trans Port (BASAGLAR  TEMPO PEN) 100 UNIT/ML SOPN Inject 22 Units into the skin daily. 10/21/22   Vicci Barnie [name], MD  insulin  lispro  (HUMALOG  KWIKPEN) 100 UNIT/ML KwikPen 3 units subcut inj with the 2 largest meals of the day. Patient taking differently: Inject 3 Units into the skin 2 (two) times daily. 10/28/22   Vicci Barnie [name], MD  lisinopril  (ZESTRIL ) 5 MG tablet Take 1 tablet (5 mg total) by mouth daily. 10/21/22   Vicci Barnie [name], MD  metFORMIN  (GLUCOPHAGE ) 1000 MG tablet Take 1 tablet (1,000 mg total) by mouth 2 (two) times daily with a meal. 07/16/22   Danton Jon HERO, PA-C  Multiple Vitamin (MULTIVITAMIN WITH MINERALS) TABS tablet Take 1 tablet by mouth daily. 01/18/19   Akula, Vijaya, MD  omeprazole  (PRILOSEC ) 20 MG capsule Take 1 capsule (20 mg total) by mouth daily. 10/21/22   Vicci Barnie [name], MD  ondansetron  (ZOFRAN -ODT) 4 MG disintegrating tablet Take 1 tablet (4 mg total) by mouth every 8 (eight) hours as needed. 01/08/23   Hildegard Loge, PA-C  pregabalin  (LYRICA ) 25 MG capsule Take 1 capsule (25 mg total) by mouth 2 (two) times daily. 10/21/22   Vicci Barnie [name], MD  thiamine  (VITAMIN B1) 100 MG tablet Take 1 tablet (100 mg total) by mouth daily. 05/17/22   Jillian Buttery, MD      Allergies    Ibuprofen     Review of Systems   Review of Systems  Constitutional:  Positive for chills, diaphoresis and fatigue. Negative for fever.  HENT:  Negative for congestion.   Respiratory:  Negative for cough, chest tightness, shortness of breath and wheezing.   Cardiovascular:  Positive for palpitations. Negative for chest pain.  Gastrointestinal:  Positive for diarrhea, nausea and vomiting. Negative for abdominal pain and constipation.  Genitourinary:  Negative for dysuria and flank pain.  Musculoskeletal:  Negative for back pain, neck pain and neck stiffness.  Skin:  Negative for rash.  Neurological:  Positive for weakness (generlaiuzed) and light-headedness. Negative for seizures and headaches.  Psychiatric/Behavioral:  Negative for agitation and confusion. The patient is not nervous/anxious.   All other systems  reviewed and are negative.   Physical Exam Updated Vital Signs BP (!) 133/96   Pulse (!) 131   Temp 98.4 F (36.9 C) (Oral)   Resp 17   Ht 5' 7 (1.702 m)   Wt 99.3 kg   SpO2 98%   BMI 34.29 kg/m  Physical Exam Vitals and nursing note reviewed.  Constitutional:      General: She is not in acute distress.    Appearance: She is well-developed. She is ill-appearing. She is not toxic-appearing or diaphoretic.  HENT:     Head: Normocephalic and atraumatic.     Nose: No congestion or rhinorrhea.     Mouth/Throat:     Mouth: Mucous membranes are dry.     Pharynx: No oropharyngeal exudate.  Eyes:     Extraocular Movements: Extraocular movements intact.     Conjunctiva/sclera: Conjunctivae normal.     Pupils: Pupils are equal, round, and reactive to light.  Cardiovascular:     Rate and Rhythm: Regular rhythm. Tachycardia present.     Heart sounds: No murmur heard. Pulmonary:     Effort: Pulmonary effort is normal. No respiratory distress.     Breath sounds: Normal breath sounds. No wheezing, rhonchi or rales.  Chest:     Chest wall: No tenderness.  Abdominal:     General: Abdomen is flat.     Palpations: Abdomen is soft.     Tenderness: There is no abdominal tenderness.  Musculoskeletal:        General: No swelling or tenderness.     Cervical back: Neck supple. No tenderness.     Right lower leg: No edema.     Left lower leg: No edema.  Skin:    General: Skin is warm and dry.     Capillary Refill: Capillary refill takes less than 2 seconds.     Coloration: Skin is pale.     Findings: No rash.  Neurological:     General: No focal deficit present.     Mental Status: She is alert.  Psychiatric:        Mood and Affect: Mood normal.     ED Results / Procedures / Treatments   Labs (all labs ordered are listed, but only abnormal results are displayed) Labs Reviewed  LIPASE, BLOOD - Abnormal; Notable for the following components:      Result Value   Lipase 10 (*)     All other components within normal limits  COMPREHENSIVE METABOLIC PANEL - Abnormal; Notable for the following components:   Chloride 94 (*)    CO2 17 (*)    Total Protein 8.4 (*)    AST 67 (*)    Anion gap 26 (*)    All other components within normal limits  CBC - Abnormal; Notable for the following components:   RDW 17.0 (*)    Platelets 434 (*)    All other components within normal limits  URINALYSIS, ROUTINE W REFLEX MICROSCOPIC - Abnormal; Notable for the following components:   APPearance HAZY (*)    Ketones, ur 40 (*)  Protein, ur 30 (*)    All other components within normal limits  LACTIC ACID, PLASMA - Abnormal; Notable for the following components:   Lactic Acid, Venous 3.5 (*)    All other components within normal limits  LACTIC ACID, PLASMA - Abnormal; Notable for the following components:   Lactic Acid, Venous 2.8 (*)    All other components within normal limits  TSH - Abnormal; Notable for the following components:   TSH 0.215 (*)    All other components within normal limits  CBG MONITORING, ED - Abnormal; Notable for the following components:   Glucose-Capillary 104 (*)    All other components within normal limits  CBG MONITORING, ED - Abnormal; Notable for the following components:   Glucose-Capillary 144 (*)    All other components within normal limits  CBG MONITORING, ED - Abnormal; Notable for the following components:   Glucose-Capillary 119 (*)    All other components within normal limits  CBG MONITORING, ED - Abnormal; Notable for the following components:   Glucose-Capillary 102 (*)    All other components within normal limits  CBG MONITORING, ED - Abnormal; Notable for the following components:   Glucose-Capillary 125 (*)    All other components within normal limits  CBG MONITORING, ED - Abnormal; Notable for the following components:   Glucose-Capillary 133 (*)    All other components within normal limits  CBG MONITORING, ED - Abnormal; Notable  for the following components:   Glucose-Capillary 139 (*)    All other components within normal limits  RESP PANEL BY RT-PCR (RSV, FLU A&B, COVID)  RVPGX2  PREGNANCY, URINE  CBG MONITORING, ED  CBG MONITORING, ED    EKG EKG Interpretation Date/Time:  Saturday January 17 2023 15:38:03 EST Ventricular Rate:  122 PR Interval:  144 QRS Duration:  84 QT Interval:  293 QTC Calculation: 418 R Axis:   -30  Text Interpretation: Sinus tachycardia Consider right atrial enlargement Left axis deviation Consider anterior infarct when compared to prior, similar appearance No STEMI Confirmed by Ginger Barefoot ([id number]) on 01/17/2023 6:02:39 PM  Radiology DG Chest Portable 1 View Result Date: 01/17/2023 CLINICAL DATA:  Hypoglycemia, search for occult infection EXAM: PORTABLE CHEST 1 VIEW COMPARISON:  12/12/2022 FINDINGS: The heart size and mediastinal contours are within normal limits. Both lungs are clear. The visualized skeletal structures are unremarkable. IMPRESSION: No acute abnormality of the lungs in AP portable projection. Electronically Signed   By: Marolyn JONETTA Jaksch M.D.   On: 01/17/2023 17:29    Procedures Procedures    CRITICAL CARE Performed by: Lonni PARAS Mansa Willers Total critical care time: 35 minutes Critical care time was exclusive of separately billable procedures and treating other patients. Critical care was necessary to treat or prevent imminent or life-threatening deterioration. Critical care was time spent personally by me on the following activities: development of treatment plan with patient and/or surrogate as well as nursing, discussions with consultants, evaluation of patient's response to treatment, examination of patient, obtaining history from patient or surrogate, ordering and performing treatments and interventions, ordering and review of laboratory studies, ordering and review of radiographic studies, pulse oximetry and re-evaluation of patient's condition.   Medications  Ordered in ED Medications  sodium chloride  0.9 % bolus 1,000 mL (has no administration in time range)  LORazepam  (ATIVAN ) tablet 1-4 mg (has no administration in time range)    Or  LORazepam  (ATIVAN ) injection 1-4 mg (has no administration in time range)  thiamine  (VITAMIN B1) tablet 100 mg (  has no administration in time range)    Or  thiamine  (VITAMIN B1) injection 100 mg (has no administration in time range)  folic acid  (FOLVITE ) tablet 1 mg (has no administration in time range)  multivitamin with minerals tablet 1 tablet (has no administration in time range)  sodium chloride  0.9 % bolus 1,000 mL ( Intravenous Stopped 01/17/23 1815)  ondansetron  (ZOFRAN ) injection 4 mg (4 mg Intravenous Given 01/17/23 1706)  alum & mag hydroxide-simeth (MAALOX/MYLANTA) 200-200-20 MG/5ML suspension 30 mL (30 mLs Oral Given 01/17/23 1813)    And  lidocaine  (XYLOCAINE ) 2 % viscous mouth solution 15 mL (15 mLs Oral Given 01/17/23 1813)    ED Course/ Medical Decision Making/ A&P                                 Medical Decision Making Amount and/or Complexity of Data Reviewed Labs: ordered. Radiology: ordered.  Risk OTC drugs. Prescription drug management. Decision regarding hospitalization.    Yolanda Flores is a 38 y.o. female with a past medical history significant for hypertension, diabetes, hyperlipidemia, previous pancreatitis, and previous DKA who presents with several days of significant fatigue, generalized weakness, nausea, vomiting, diarrhea, malaise, sweats, and hypoglycemia and route.  According to patient, she has been feeling worse for the last few days and today felt palpitations and worse.  She reports that her glucose was found to be in the 30s with EMS, they gave oral glucose which improved to 74 then it dropped back to 46.  Was given more oral glucose and then upon arrival to the emergency department had dropped to 24.  Was given orange juice orally awaiting evaluation.  Patient reports  that for last 2 days she has had recurrent nausea, vomiting, and diarrhea and think she is getting dehydrated.  She reports no dysuria or urinary frequency.  She reports she had some mild dry cough but that has improved.  She is denying any documented fevers but has had chills and all the sweats.  She reports that she has not taken any insulin  since yesterday and she is not sure why her glucose keeps falling.  She has not any chest pain or shortness of breath currently and just has some intermittent palpitations.  Heart rate was found to be between 120s and 150s during my evaluation.  She reports no other medication changes and said that she had to come to the hospital around Christmas for similar GI symptoms.  On exam, patient is tachycardic in the 140s and EKG shows a sinus rhythm.  QT was not prolonged.  She has dry mucous membranes and is ill-appearing.  She is slightly pale.  Does not use any bleeding.  Abdomen is nontender.  Chest nontender.  No murmur.  No focal neurologic deficits initially.  Clinically I am concerned about recurrent hypoglycemia.  Will check a glucose again and give her some fluids for dehydration.  Will order some nausea medicine as she is still nauseous.  Will get screening labs to look for occult infection or significant electrolyte disturbances.  Given her recurrent hypoglycemia episodes, anticipate she may end up needing a glucose drip and admission.  Anticipate reassessment after workup.  7:39 PM Workup continues to return.  Although she has not been hypoglycemic like she was on arrival, she still has heart rates going into the 140s.  She is denying any chest pain but does think she could be having some degree of  alcohol withdrawal given the nausea vomiting and not being able to get alcohol inside her for the last day or 2.  She does that she try to drink some shots yesterday but could not keep it down.  I suspect she is having some withdrawal.  Will give Ativan  per CIWA  protocol and give her some more fluids.  Given her labile glucoses earlier, her persistent tachycardia and dehydration, and her withdrawal symptoms now, I do not feel she is safe to go home with heart rate still in the 130s despite fluids.  Will call for admission for rehydration and alcohol withdrawal symptoms.         Final Clinical Impression(s) / ED Diagnoses Final diagnoses:  Dehydration  Hypoglycemia  Nausea vomiting and diarrhea    Clinical Impression: 1. Dehydration   2. Hypoglycemia   3. Nausea vomiting and diarrhea     Disposition: Admit  This note was prepared with assistance of Dragon voice recognition software. Occasional wrong-word or sound-a-like substitutions may have occurred due to the inherent limitations of voice recognition software.         Zaniyah Wernette, Lonni PARAS, MD 01/17/23 2204

## 2023-01-17 NOTE — ED Triage Notes (Signed)
 Pt to ED reporting 1 week of nausea vomiting and fatigue. Patient recently seen in ED without resolution. Pt called EMS today for worsening nausea, vomiting, fatigue and sweating. EMS checked CBG to be 37 en route. Administered 15g of oral glucose, recheck was 74 then dropped to 46. 15g more of oral glucose given and recheck in ED is 24.   2-3 episodes of vomiting today with intermittent diarrhea throughout the week. No fevers at home.   Pt is alert and oriented, interacting with RN appropriately. Insulin  last taken yesterday. Pt reports daily she drinks 2-3 shots of liqueur and has not had a drink today.

## 2023-01-17 NOTE — ED Notes (Signed)
CBG- 77 

## 2023-01-17 NOTE — ED Notes (Signed)
CBG 144 

## 2023-01-17 NOTE — ED Notes (Signed)
 CRITICAL VALUE STICKER  CRITICAL VALUE:Lactate 3.5  RECEIVER (on-site recipient of call):T Dayane Hillenburg, RN  DATE & TIME NOTIFIED:   MESSENGER (representative from lab):  MD NOTIFIED: [place]  TIME OF NOTIFICATION:1752  RESPONSE:

## 2023-01-17 NOTE — Progress Notes (Signed)
 Plan of Care Note for accepted transfer   Patient: CARAN STORCK MRN: [id number]   DOA: 01/17/2023  Facility requesting transfer: [facility].  ER. Requesting Provider: Dr. Cleotis. Reason for transfer: Intractable nausea vomiting with metabolic acidosis and hypoglycemia. Facility course:.  38 year old female with history of diabetes mellitus type 2 alcohol use and history of pancreatitis admitted in November last year with similar complaints of nausea vomiting metabolic acidosis and starvation ketosis presents to the ER with intractable nausea vomiting found to be hypoglycemic tachycardic and patient also was not able to take her alcohol because of vomiting.  Patient has metabolic acidosis with starvation ketosis and dehydration placed on fluids and closely observing her blood glucose levels and also on CIWA protocol.  Plan of care: The patient is accepted for admission to [facility] unit, at [facility]..   Author: Redia LOISE Cleaver, MD 01/17/2023  Check [email or web address] for on-call coverage.  Nursing staff, Please call [facility] Admits & Consults System-Wide number on Amion as soon as patient's arrival, so appropriate admitting provider can evaluate the pt.

## 2023-01-17 NOTE — ED Notes (Signed)
 Critical CBG 24, Orange juice given, RN aware.

## 2023-01-17 NOTE — ED Notes (Signed)
CBG 76 

## 2023-01-18 ENCOUNTER — Observation Stay (HOSPITAL_COMMUNITY): Payer: Commercial Managed Care - HMO

## 2023-01-18 DIAGNOSIS — E785 Hyperlipidemia, unspecified: Secondary | ICD-10-CM | POA: Diagnosis present

## 2023-01-18 DIAGNOSIS — E119 Type 2 diabetes mellitus without complications: Secondary | ICD-10-CM | POA: Diagnosis not present

## 2023-01-18 DIAGNOSIS — E861 Hypovolemia: Secondary | ICD-10-CM | POA: Diagnosis present

## 2023-01-18 DIAGNOSIS — K219 Gastro-esophageal reflux disease without esophagitis: Secondary | ICD-10-CM | POA: Diagnosis present

## 2023-01-18 DIAGNOSIS — E0781 Sick-euthyroid syndrome: Secondary | ICD-10-CM | POA: Diagnosis present

## 2023-01-18 DIAGNOSIS — Z6833 Body mass index (BMI) 33.0-33.9, adult: Secondary | ICD-10-CM | POA: Diagnosis not present

## 2023-01-18 DIAGNOSIS — E11649 Type 2 diabetes mellitus with hypoglycemia without coma: Secondary | ICD-10-CM | POA: Diagnosis present

## 2023-01-18 DIAGNOSIS — E872 Acidosis, unspecified: Secondary | ICD-10-CM | POA: Diagnosis present

## 2023-01-18 DIAGNOSIS — Z1152 Encounter for screening for COVID-19: Secondary | ICD-10-CM | POA: Diagnosis not present

## 2023-01-18 DIAGNOSIS — F10930 Alcohol use, unspecified with withdrawal, uncomplicated: Secondary | ICD-10-CM | POA: Diagnosis not present

## 2023-01-18 DIAGNOSIS — E860 Dehydration: Secondary | ICD-10-CM | POA: Diagnosis present

## 2023-01-18 DIAGNOSIS — Z87891 Personal history of nicotine dependence: Secondary | ICD-10-CM | POA: Diagnosis not present

## 2023-01-18 DIAGNOSIS — F10939 Alcohol use, unspecified with withdrawal, unspecified: Secondary | ICD-10-CM

## 2023-01-18 DIAGNOSIS — R000 Tachycardia, unspecified: Secondary | ICD-10-CM | POA: Diagnosis present

## 2023-01-18 DIAGNOSIS — Z888 Allergy status to other drugs, medicaments and biological substances status: Secondary | ICD-10-CM | POA: Diagnosis not present

## 2023-01-18 DIAGNOSIS — R112 Nausea with vomiting, unspecified: Secondary | ICD-10-CM

## 2023-01-18 DIAGNOSIS — Z8249 Family history of ischemic heart disease and other diseases of the circulatory system: Secondary | ICD-10-CM | POA: Diagnosis not present

## 2023-01-18 DIAGNOSIS — Z79899 Other long term (current) drug therapy: Secondary | ICD-10-CM | POA: Diagnosis not present

## 2023-01-18 DIAGNOSIS — Z833 Family history of diabetes mellitus: Secondary | ICD-10-CM | POA: Diagnosis not present

## 2023-01-18 DIAGNOSIS — I10 Essential (primary) hypertension: Secondary | ICD-10-CM | POA: Diagnosis present

## 2023-01-18 DIAGNOSIS — F10139 Alcohol abuse with withdrawal, unspecified: Secondary | ICD-10-CM | POA: Diagnosis present

## 2023-01-18 DIAGNOSIS — K529 Noninfective gastroenteritis and colitis, unspecified: Secondary | ICD-10-CM | POA: Diagnosis present

## 2023-01-18 DIAGNOSIS — Z7984 Long term (current) use of oral hypoglycemic drugs: Secondary | ICD-10-CM | POA: Diagnosis not present

## 2023-01-18 DIAGNOSIS — R519 Headache, unspecified: Secondary | ICD-10-CM

## 2023-01-18 DIAGNOSIS — E669 Obesity, unspecified: Secondary | ICD-10-CM | POA: Diagnosis present

## 2023-01-18 DIAGNOSIS — E8889 Other specified metabolic disorders: Secondary | ICD-10-CM | POA: Diagnosis present

## 2023-01-18 DIAGNOSIS — Z794 Long term (current) use of insulin: Secondary | ICD-10-CM | POA: Diagnosis not present

## 2023-01-18 DIAGNOSIS — E162 Hypoglycemia, unspecified: Secondary | ICD-10-CM

## 2023-01-18 LAB — CBG MONITORING, ED: Glucose-Capillary: 94 mg/dL (ref 70–99)

## 2023-01-18 LAB — GLUCOSE, CAPILLARY
Glucose-Capillary: 94 mg/dL (ref 70–99)
Glucose-Capillary: 94 mg/dL (ref 70–99)
Glucose-Capillary: 96 mg/dL (ref 70–99)
Glucose-Capillary: 92 mg/dL (ref 70–99)
Glucose-Capillary: 111 mg/dL — ABNORMAL HIGH (ref 70–99)
Glucose-Capillary: 95 mg/dL (ref 70–99)
Glucose-Capillary: 97 mg/dL (ref 70–99)
Glucose-Capillary: 106 mg/dL — ABNORMAL HIGH (ref 70–99)
Glucose-Capillary: 110 mg/dL — ABNORMAL HIGH (ref 70–99)
Glucose-Capillary: 132 mg/dL — ABNORMAL HIGH (ref 70–99)

## 2023-01-18 LAB — COMPREHENSIVE METABOLIC PANEL
ALT: 31 U/L (ref 0–44)
AST: 60 U/L — ABNORMAL HIGH (ref 15–41)
Albumin: 3.4 g/dL — ABNORMAL LOW (ref 3.5–5.0)
Alkaline Phosphatase: 69 U/L (ref 38–126)
Anion gap: 14 (ref 5–15)
BUN: 13 mg/dL (ref 6–20)
CO2: 23 mmol/L (ref 22–32)
Calcium: 8.4 mg/dL — ABNORMAL LOW (ref 8.9–10.3)
Chloride: 99 mmol/L (ref 98–111)
Creatinine, Ser: 0.66 mg/dL (ref 0.44–1.00)
GFR, Estimated: >60 mL/min (ref >60)
Glucose, Bld: 92 mg/dL (ref 70–99)
Potassium: 4.1 mmol/L (ref 3.5–5.1)
Sodium: 136 mmol/L (ref 135–145)
Total Bilirubin: 1.5 mg/dL — ABNORMAL HIGH (ref 0.0–1.2)
Total Protein: 7 g/dL (ref 6.5–8.1)

## 2023-01-18 LAB — T4, FREE: Free T4: 0.64 ng/dL (ref 0.61–1.12)

## 2023-01-18 LAB — MRSA NEXT GEN BY PCR, NASAL: MRSA by PCR Next Gen: NOT DETECTED

## 2023-01-18 MED ORDER — ONDANSETRON HCL 4 MG/2ML IJ SOLN
4.0000 mg | Freq: Four times a day (QID) | INTRAMUSCULAR | Status: DC | PRN
  Administered 2023-01-18 – 2023-01-19 (×4): 4 mg via INTRAVENOUS
  Filled 2023-01-18 (×4): qty 2

## 2023-01-18 MED ORDER — ORAL CARE MOUTH RINSE
15.0000 mL | OROMUCOSAL | Status: DC | PRN
Start: 2023-01-18 — End: 2023-01-20
  Administered 2023-01-18: 15 mL via OROMUCOSAL

## 2023-01-18 MED ORDER — SODIUM CHLORIDE 0.9 % IV SOLN
INTRAVENOUS | Status: DC
Start: 2023-01-18 — End: 2023-01-18

## 2023-01-18 MED ORDER — SODIUM CHLORIDE 0.9% FLUSH
3.0000 mL | INTRAVENOUS | Status: DC | PRN
  Administered 2023-01-18 – 2023-01-19 (×6): 3 mL via INTRAVENOUS

## 2023-01-18 MED ORDER — SODIUM CHLORIDE 0.9 % IV SOLN
INTRAVENOUS | Status: DC
Start: 2023-01-18 — End: 2023-01-19

## 2023-01-18 MED ORDER — LORAZEPAM 2 MG/ML IJ SOLN
1.0000 mg | INTRAMUSCULAR | Status: DC | PRN
Start: 2023-01-18 — End: 2023-01-21

## 2023-01-18 MED ORDER — SODIUM CHLORIDE 0.9% FLUSH
3.0000 mL | Freq: Two times a day (BID) | INTRAVENOUS | Status: DC
  Administered 2023-01-18 – 2023-01-19 (×4): 3 mL via INTRAVENOUS

## 2023-01-18 MED ORDER — CHLORHEXIDINE GLUCONATE CLOTH 2 % EX PADS
6.0000 | MEDICATED_PAD | Freq: Every day | CUTANEOUS | Status: DC
  Administered 2023-01-18 (×2): 6 via TOPICAL

## 2023-01-18 MED ORDER — LORAZEPAM 1 MG PO TABS
1.0000 mg | ORAL_TABLET | ORAL | Status: DC | PRN
  Administered 2023-01-18: 1 mg via ORAL
  Filled 2023-01-18: qty 1

## 2023-01-18 MED ORDER — MORPHINE SULFATE (PF) 2 MG/ML IV SOLN
1.0000 mg | Freq: Once | INTRAVENOUS | Status: AC | PRN
  Administered 2023-01-18: 1 mg via INTRAVENOUS
  Filled 2023-01-18: qty 1

## 2023-01-18 MED ORDER — OXYCODONE HCL 5 MG PO TABS
5.0000 mg | ORAL_TABLET | Freq: Four times a day (QID) | ORAL | Status: DC | PRN
  Administered 2023-01-18 – 2023-01-19 (×3): 5 mg via ORAL
  Filled 2023-01-18 (×4): qty 1

## 2023-01-18 MED ORDER — PANTOPRAZOLE SODIUM 40 MG IV SOLR
40.0000 mg | Freq: Every day | INTRAVENOUS | Status: DC
  Administered 2023-01-18 – 2023-01-19 (×2): 40 mg via INTRAVENOUS
  Filled 2023-01-18 (×2): qty 10

## 2023-01-18 MED ORDER — LISINOPRIL 5 MG PO TABS
5.0000 mg | ORAL_TABLET | Freq: Every day | ORAL | Status: DC
  Administered 2023-01-18 – 2023-01-20 (×3): 5 mg via ORAL
  Filled 2023-01-18 (×2): qty 2
  Filled 2023-01-18: qty 1

## 2023-01-18 MED ORDER — HYDRALAZINE HCL 20 MG/ML IJ SOLN
10.0000 mg | Freq: Four times a day (QID) | INTRAMUSCULAR | Status: DC | PRN
  Administered 2023-01-18: 10 mg via INTRAVENOUS
  Filled 2023-01-18: qty 1

## 2023-01-18 MED ORDER — NALOXONE HCL 0.4 MG/ML IJ SOLN: 0.4000 mg | INTRAMUSCULAR | Status: DC | PRN

## 2023-01-18 MED ORDER — ACETAMINOPHEN 325 MG PO TABS
650.0000 mg | ORAL_TABLET | Freq: Four times a day (QID) | ORAL | Status: DC | PRN
  Administered 2023-01-18: 650 mg via ORAL
  Filled 2023-01-18: qty 2

## 2023-01-18 MED ORDER — LORAZEPAM 1 MG PO TABS: 0.0000 mg | ORAL_TABLET | Freq: Two times a day (BID) | ORAL | Status: DC

## 2023-01-18 MED ORDER — ACETAMINOPHEN 650 MG RE SUPP
650.0000 mg | Freq: Four times a day (QID) | RECTAL | Status: DC | PRN
Start: 2023-01-18 — End: 2023-01-20

## 2023-01-18 MED ORDER — LORAZEPAM 1 MG PO TABS
0.0000 mg | ORAL_TABLET | Freq: Four times a day (QID) | ORAL | Status: AC
  Administered 2023-01-18: 1 mg via ORAL
  Administered 2023-01-19 (×2): 2 mg via ORAL
  Filled 2023-01-18 (×2): qty 2
  Filled 2023-01-18: qty 1

## 2023-01-18 NOTE — ED Notes (Signed)
Report given to Sookie RN   

## 2023-01-18 NOTE — Progress Notes (Signed)
 [facility] PROGRESS NOTE   Yolanda Flores FMW:[id number] DOB: 1985/12/05 DOA: 01/17/2023  PCP: Yolanda Barnie [name], MD  Brief History: 38 y.o. female with medical history significant of insulin -dependent type 2 diabetes, hypertension, hyperlipidemia, alcohol use disorder, pancreatitis, GERD presented to ED with complaints of nausea, vomiting, and diarrhea.  Found to be hypoglycemic by EMS with CBG 37.  She was given 15 g of oral glucose with improvement of CBG 74 initially but then it dropped to 46 again.  She was also noted to be tachycardic and had lactic acidosis.  She was hospitalized for further management.  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    Consultants: None  Procedures: None    Subjective/Interval History: Complains of headache this morning.  Denies any visual disturbances.  No weakness on any 1 side.  Nausea is present but no vomiting this morning.  No diarrhea in the last 24 hours.  No abdominal discomfort.  Feels fatigued.    Assessment/Plan:  Acute gastroenteritis Was exposed to multiple sick contacts at the place where she works.  Diarrhea appears to have subsided.  Came in with intractable nausea vomiting but seems to be improving as well.  Abdomen is benign to examination. LFTs were unremarkable.  Lipase was normal. Lactic acid level was noted to have improved.  Continue supportive treatment.  Continue IV fluids though we will decrease the rate today.  Lactic acidosis Most likely secondary to hypovolemia.  Improvement noted.  Continue to monitor.  Recurrent hypoglycemia in the setting of insulin -dependent diabetes mellitus type 2 Hypoglycemia likely due to acute illness and poor oral intake.  Glucose levels have improved though remains borderline low.  Continue to hold insulin  for now.  Continue to monitor CBGs. Last HbA1c was 11.3 in October 2024.  Will check it  tomorrow.  Headache Patient reports severe diffuse headache.  No focal neurological deficits noted.  No falls or injuries.  Will get a CT head.  History of alcohol abuse with withdrawal symptoms Noted to be tachycardic and hypertensive though not very tremulous.  Tachycardia could be multifactorial including hypovolemia.  Will continue with CIWA protocol.  Thiamine  folic acid  and multivitamins.  Essential hypertension/sinus tachycardia Blood pressures are creeping up.  Resume her home medication regimen.  Hydralazine  as needed.  Monitor heart rate.  Low TSH No known history of hypothyroidism.  Free T4 level is pending.  GERD Continue PPI  Obesity Estimated body mass index is 31.87 kg/m as calculated from the following:   Height as of this encounter: 5' 7 (1.702 m).   Weight as of this encounter: 92.3 kg.   DVT Prophylaxis: SCDs Code Status: Full code Family Communication: Discussed with patient Disposition Plan: Hopefully home when improved  Status is: Observation The patient will require care spanning > 2 midnights and should be moved to inpatient because: Dehydration, withdrawal symptoms, tachycardia      Medications: Scheduled:  Chlorhexidine  Gluconate Cloth  6 each Topical Q0600   folic acid   1 mg Oral Daily   multivitamin with minerals  1 tablet Oral Daily   pantoprazole  (PROTONIX ) IV  40 mg Intravenous Daily   sodium chloride  flush  3 mL Intravenous Q12H   thiamine   100 mg Oral Daily   Or   thiamine   100 mg Intravenous Daily   Continuous:  sodium chloride  150 mL/hr at 01/18/23 0636   PRN:acetaminophen  **OR** acetaminophen , LORazepam  **OR** LORazepam , naLOXone  (NARCAN )  injection, ondansetron  (ZOFRAN ) IV, mouth rinse, mouth rinse, oxyCODONE , sodium chloride  flush  Antibiotics: Anti-infectives (From admission, onward)    None       Objective:  Vital Signs  Vitals:   01/18/23 0600 01/18/23 0630 01/18/23 0800 01/18/23 0836  BP: (!) 140/109 (!)  139/100 (!) 150/98   Pulse: (!) 106 (!) 143 96   Resp: 16 (!) 23    Temp:    98.7 F (37.1 C)  TempSrc:    Oral  SpO2: 94% 97%    Weight:      Height:        Intake/Output Summary (Last 24 hours) at 01/18/2023 0906 Last data filed at 01/18/2023 0636 Gross per 24 hour  Intake 2517.37 ml  Output --  Net 2517.37 ml   Filed Weights   01/17/23 1551 01/18/23 0214  Weight: 99.3 kg 92.3 kg    General appearance: Awake alert.  In no distress Resp: Clear to auscultation bilaterally.  Normal effort Cardio: S1-S2 is tachycardic regular.  No S3-S4.  No rubs murmurs or bruit GI: Abdomen is soft.  Nontender nondistended.  Bowel sounds are present normal.  No masses organomegaly Extremities: No edema.  Full range of motion of lower extremities. Neurologic: Alert and oriented x3.  No focal neurological deficits.    Lab Results:  Data Reviewed: I have personally reviewed following labs and reports of the imaging studies  CBC: Recent Labs  Lab 01/17/23 1541  WBC 8.1  HGB 13.3  HCT 39.0  MCV 83.3  PLT 434*    Basic Metabolic Panel: Recent Labs  Lab 01/17/23 1541 01/18/23 0526  NA 137 136  K 4.4 4.1  CL 94* 99  CO2 17* 23  GLUCOSE 71 92  BUN 13 13  CREATININE 0.79 0.66  CALCIUM  9.4 8.4*    GFR: Estimated Creatinine Clearance: 114.5 mL/min (by C-G formula based on SCr of 0.66 mg/dL).  Liver Function Tests: Recent Labs  Lab 01/17/23 1541 01/18/23 0526  AST 67* 60*  ALT 30 31  ALKPHOS 93 69  BILITOT 1.1 1.5*  PROT 8.4* 7.0  ALBUMIN  4.4 3.4*    Recent Labs  Lab 01/17/23 1541  LIPASE 10*   CBG: Recent Labs  Lab 01/18/23 0020 01/18/23 0216 01/18/23 0403 01/18/23 0603 01/18/23 0746  GLUCAP 94 94 94 96 92     Thyroid Function Tests: Recent Labs    01/17/23 1706  TSH 0.215*     Recent Results (from the past 240 hours)  Resp panel by RT-PCR (RSV, Flu A&B, Covid) Anterior Nasal Swab     Status: None   Collection Time: 01/17/23  5:06 PM   Specimen:  Anterior Nasal Swab  Result Value Ref Range Status   SARS Coronavirus 2 by RT PCR NEGATIVE NEGATIVE Final    Comment: (NOTE) SARS-CoV-2 target nucleic acids are NOT DETECTED.  The SARS-CoV-2 RNA is generally detectable in upper respiratory specimens during the acute phase of infection. The lowest concentration of SARS-CoV-2 viral copies this assay can detect is 138 copies/mL. A negative result does not preclude SARS-Cov-2 infection and should not be used as the sole basis for treatment or other patient management decisions. A negative result may occur with  improper specimen collection/handling, submission of specimen other than nasopharyngeal swab, presence of viral mutation(s) within the areas targeted by this assay, and inadequate number of viral copies(<138 copies/mL). A negative result must be combined with clinical observations, patient history, and epidemiological information. The expected result is Negative.  Fact Sheet for Patients:  bloggercourse.com  Fact Sheet for Healthcare Providers:  seriousbroker.it  This test is no t yet approved or cleared by the [place]  FDA and  has been authorized for detection and/or diagnosis of SARS-CoV-2 by FDA under an Emergency Use Authorization (EUA). This EUA will remain  in effect (meaning this test can be used) for the duration of the COVID-19 declaration under Section 564(b)(1) of the Act, 21 U.S.C.section 360bbb-3(b)(1), unless the authorization is terminated  or revoked sooner.       Influenza A by PCR NEGATIVE NEGATIVE Final   Influenza B by PCR NEGATIVE NEGATIVE Final    Comment: (NOTE) The Xpert Xpress SARS-CoV-2/FLU/RSV plus assay is intended as an aid in the diagnosis of influenza from Nasopharyngeal swab specimens and should not be used as a sole basis for treatment. Nasal washings and aspirates are unacceptable for Xpert Xpress SARS-CoV-2/FLU/RSV testing.  Fact  Sheet for Patients: bloggercourse.com  Fact Sheet for Healthcare Providers: seriousbroker.it  This test is not yet approved or cleared by the [place]  FDA and has been authorized for detection and/or diagnosis of SARS-CoV-2 by FDA under an Emergency Use Authorization (EUA). This EUA will remain in effect (meaning this test can be used) for the duration of the COVID-19 declaration under Section 564(b)(1) of the Act, 21 U.S.C. section 360bbb-3(b)(1), unless the authorization is terminated or revoked.     Resp Syncytial Virus by PCR NEGATIVE NEGATIVE Final    Comment: (NOTE) Fact Sheet for Patients: bloggercourse.com  Fact Sheet for Healthcare Providers: seriousbroker.it  This test is not yet approved or cleared by the [place]  FDA and has been authorized for detection and/or diagnosis of SARS-CoV-2 by FDA under an Emergency Use Authorization (EUA). This EUA will remain in effect (meaning this test can be used) for the duration of the COVID-19 declaration under Section 564(b)(1) of the Act, 21 U.S.C. section 360bbb-3(b)(1), unless the authorization is terminated or revoked.  Performed at [facility], [address], [place], [place] [address]       Radiology Studies: DG Chest Portable 1 View Result Date: 01/17/2023 CLINICAL DATA:  Hypoglycemia, search for occult infection EXAM: PORTABLE CHEST 1 VIEW COMPARISON:  12/12/2022 FINDINGS: The heart size and mediastinal contours are within normal limits. Both lungs are clear. The visualized skeletal structures are unremarkable. IMPRESSION: No acute abnormality of the lungs in AP portable projection. Electronically Signed   By: Marolyn JONETTA Jaksch M.D.   On: 01/17/2023 17:29       LOS: 0 days   Mykhia Danish  [facility] Pager on [email or web address]  01/18/2023, 9:06 AM

## 2023-01-18 NOTE — Plan of Care (Signed)
  Problem: Clinical Measurements: Goal: Ability to maintain clinical measurements within normal limits will improve Outcome: Progressing Goal: Will remain free from infection Outcome: Progressing Goal: Diagnostic test results will improve Outcome: Progressing   Problem: Activity: Goal: Risk for activity intolerance will decrease Outcome: Progressing   Problem: Nutrition: Goal: Adequate nutrition will be maintained Outcome: Progressing   Problem: Pain Management: Goal: General experience of comfort will improve Outcome: Progressing   Problem: Safety: Goal: Ability to remain free from injury will improve Outcome: Progressing   Problem: Skin Integrity: Goal: Risk for impaired skin integrity will decrease Outcome: Progressing

## 2023-01-18 NOTE — H&P (Signed)
 History and Physical    YULIA Flores FMW:[id number] DOB: 20-Aug-1985 DOA: 01/17/2023  PCP: Vicci Barnie [name], MD  Patient coming from: Home  Chief Complaint: Vomiting  HPI: Yolanda Flores is a 38 y.o. female with medical history significant of insulin -dependent type 2 diabetes, hypertension, hyperlipidemia, alcohol use disorder, pancreatitis, GERD presented to ED with complaints of nausea, vomiting, and diarrhea.  Found to be hypoglycemic by EMS with CBG 37.  She was given 15 g of oral glucose with improvement of CBG 74 initially but then it dropped to 46 again.  She was given additional oral 15 g of glucose and upon arrival to the ED, CBG was 24.  She was given orange juice.  Noted to be tachycardic to the 140s and EKG showing sinus rhythm.  Afebrile and not hypotensive.  Labs showing no leukocytosis or anemia, platelet count 434k (improved since 01/08/2023), chloride 94, bicarb 17, anion gap 26, glucose 71, creatinine 0.79, AST 67 (elevated on labs done in November 2024 as well), remainder of LFTs normal, no elevation of lipase, urine pregnancy test negative, lactic acid 3.5> 2.8, TSH 0.215, COVID/influenza/RSV PCR negative.  UA with 40 ketones, 30 protein, and no signs of infection.  Chest x-ray showing no acute abnormality. Patient was given Maalox, fentanyl , folic acid , multivitamin, thiamine , Zofran , 2 L normal saline, and Ativan  in the ED.  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  About a week ago she started having similar symptoms.  She is having intractable nausea and nonbloody vomiting and has not been able to tolerate p.o. intake.  Also having acid reflux.  In addition, she had nonbloody diarrhea at the beginning of the week which has now stopped.  Denies abdominal pain or recent antibiotic use.  Patient states yesterday she was sweating a lot and shaky which made her concerned and she called EMS and when they  checked her blood sugar it was found to be very low.  Due to nausea/vomiting and not being able to eat, she has not taken her home insulin  for almost 2 days.  States she works night shift and has to drink coffee every night to stay awake but due to feeling sick, she has not been able to drink coffee for the past few days and now having headaches.  Also she normally drinks 4-5 shots of liquor about 5 times a week but has not been able to drink alcohol due to vomiting.  No other complaints.  Denies shortness of breath or chest pain.  Review of Systems:  Review of Systems  All other systems reviewed and are negative.   Past Medical History:  Diagnosis Date   AKI (acute kidney injury) (HCC)    Diabetes mellitus (HCC)    HLD (hyperlipidemia)    Hypertension    Pancreatitis     Past Surgical History:  Procedure Laterality Date   FOOT SURGERY       reports that she quit smoking about 4 years ago. Her smoking use included cigarettes. She has never used smokeless tobacco. She reports that she does not currently use alcohol. She reports that she does not use drugs.  Allergies  Allergen Reactions   Ibuprofen  Nausea And Vomiting    Family History  Problem Relation Age of Onset   Hypertension Mother    Diabetes Mother    Hypertension Father    Hypertension Brother     Prior to Admission medications  Medication Sig Start Date End Date Taking? Authorizing Provider  aspirin -acetaminophen -caffeine  (EXCEDRIN  MIGRAINE) 250-250-65 MG tablet Take 1-2 tablets by mouth every 6 (six) hours as needed for headache.    [provider]  atorvastatin  (LIPITOR) 40 MG tablet Take 1 tablet (40 mg total) by mouth daily. 07/16/22 12/12/22  Danton Jon HERO, PA-C  Continuous Glucose Receiver (FREESTYLE LIBRE 3 READER) DEVI 1 Device by Does not apply route daily. 10/21/22   Vicci Barnie [name], MD  Continuous Glucose Sensor (FREESTYLE LIBRE 3 SENSOR) MISC Change Q 2 weeks 10/21/22   Vicci Barnie [name], MD   Insulin  Glargine w/ Trans Port (BASAGLAR  TEMPO PEN) 100 UNIT/ML SOPN Inject 22 Units into the skin daily. 10/21/22   Vicci Barnie [name], MD  insulin  lispro (HUMALOG  KWIKPEN) 100 UNIT/ML KwikPen 3 units subcut inj with the 2 largest meals of the day. Patient taking differently: Inject 3 Units into the skin 2 (two) times daily. 10/28/22   Vicci Barnie [name], MD  lisinopril  (ZESTRIL ) 5 MG tablet Take 1 tablet (5 mg total) by mouth daily. 10/21/22   Vicci Barnie [name], MD  metFORMIN  (GLUCOPHAGE ) 1000 MG tablet Take 1 tablet (1,000 mg total) by mouth 2 (two) times daily with a meal. 07/16/22   Danton Jon HERO, PA-C  Multiple Vitamin (MULTIVITAMIN WITH MINERALS) TABS tablet Take 1 tablet by mouth daily. 01/18/19   Akula, Vijaya, MD  omeprazole  (PRILOSEC ) 20 MG capsule Take 1 capsule (20 mg total) by mouth daily. 10/21/22   Vicci Barnie [name], MD  ondansetron  (ZOFRAN -ODT) 4 MG disintegrating tablet Take 1 tablet (4 mg total) by mouth every 8 (eight) hours as needed. 01/08/23   Hildegard Loge, PA-C  pregabalin  (LYRICA ) 25 MG capsule Take 1 capsule (25 mg total) by mouth 2 (two) times daily. 10/21/22   Vicci Barnie [name], MD  thiamine  (VITAMIN B1) 100 MG tablet Take 1 tablet (100 mg total) by mouth daily. 05/17/22   Jillian Buttery, MD    Physical Exam: Vitals:   01/18/23 0100 01/18/23 0130 01/18/23 0200 01/18/23 0214  BP: (!) 121/90 (!) 124/92  (!) 141/99  Pulse: (!) 104 99  (!) 107  Resp: 17 18  (!) 22  Temp:    98.4 F (36.9 C)  TempSrc:   Oral Oral  SpO2: 96% 100%  96%  Weight:    92.3 kg  Height:        Physical Exam Vitals reviewed.  Constitutional:      General: She is not in acute distress.    Comments: Slightly tremulous  HENT:     Head: Normocephalic and atraumatic.  Eyes:     Extraocular Movements: Extraocular movements intact.  Cardiovascular:     Rate and Rhythm: Regular rhythm. Tachycardia present.     Pulses: Normal pulses.  Pulmonary:     Effort: Pulmonary effort is normal. No  respiratory distress.     Breath sounds: Normal breath sounds. No wheezing or rales.  Abdominal:     General: Bowel sounds are normal. There is no distension.     Palpations: Abdomen is soft.     Tenderness: There is no abdominal tenderness. There is no guarding.  Musculoskeletal:     Cervical back: Normal range of motion.     Right lower leg: No edema.     Left lower leg: No edema.  Skin:    General: Skin is warm and dry.  Neurological:     General: No focal deficit present.     Mental Status: She  is alert and oriented to person, place, and time.     Cranial Nerves: No cranial nerve deficit.     Sensory: No sensory deficit.     Motor: No weakness.     Labs on Admission: I have personally reviewed following labs and imaging studies  CBC: Recent Labs  Lab 01/17/23 1541  WBC 8.1  HGB 13.3  HCT 39.0  MCV 83.3  PLT 434*   Basic Metabolic Panel: Recent Labs  Lab 01/17/23 1541  NA 137  K 4.4  CL 94*  CO2 17*  GLUCOSE 71  BUN 13  CREATININE 0.79  CALCIUM  9.4   GFR: Estimated Creatinine Clearance: 114.5 mL/min (by C-G formula based on SCr of 0.79 mg/dL). Liver Function Tests: Recent Labs  Lab 01/17/23 1541  AST 67*  ALT 30  ALKPHOS 93  BILITOT 1.1  PROT 8.4*  ALBUMIN  4.4   Recent Labs  Lab 01/17/23 1541  LIPASE 10*   No results for input(s): AMMONIA in the last 168 hours. Coagulation Profile: No results for input(s): INR, PROTIME in the last 168 hours. Cardiac Enzymes: No results for input(s): CKTOTAL, CKMB, CKMBINDEX, TROPONINI in the last 168 hours. BNP (last 3 results) No results for input(s): PROBNP in the last 8760 hours. HbA1C: No results for input(s): HGBA1C in the last 72 hours. CBG: Recent Labs  Lab 01/17/23 1852 01/17/23 2002 01/17/23 2233 01/18/23 0020 01/18/23 0216  GLUCAP 133* 139* 90 94 94   Lipid Profile: No results for input(s): CHOL, HDL, LDLCALC, TRIG, CHOLHDL, LDLDIRECT in the last 72  hours. Thyroid Function Tests: Recent Labs    01/17/23 1706  TSH 0.215*   Anemia Panel: No results for input(s): VITAMINB12, FOLATE, FERRITIN, TIBC, IRON, RETICCTPCT in the last 72 hours. Urine analysis:    Component Value Date/Time   COLORURINE YELLOW 01/17/2023 1541   APPEARANCEUR HAZY (A) 01/17/2023 1541   LABSPEC 1.016 01/17/2023 1541   PHURINE 5.5 01/17/2023 1541   GLUCOSEU NEGATIVE 01/17/2023 1541   HGBUR NEGATIVE 01/17/2023 1541   BILIRUBINUR NEGATIVE 01/17/2023 1541   KETONESUR 40 (A) 01/17/2023 1541   PROTEINUR 30 (A) 01/17/2023 1541   UROBILINOGEN 2.0 (H) 12/31/2009 1757   NITRITE NEGATIVE 01/17/2023 1541   LEUKOCYTESUR NEGATIVE 01/17/2023 1541    Radiological Exams on Admission: DG Chest Portable 1 View Result Date: 01/17/2023 CLINICAL DATA:  Hypoglycemia, search for occult infection EXAM: PORTABLE CHEST 1 VIEW COMPARISON:  12/12/2022 FINDINGS: The heart size and mediastinal contours are within normal limits. Both lungs are clear. The visualized skeletal structures are unremarkable. IMPRESSION: No acute abnormality of the lungs in AP portable projection. Electronically Signed   By: Marolyn JONETTA Jaksch M.D.   On: 01/17/2023 17:29    EKG: Independently reviewed.  Sinus tachycardia, no significant change compared to previous EKG.  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  No longer having diarrhea now but continues to have nausea and vomiting and not able to tolerate p.o. intake.  No fever or leukocytosis. AST 67 (elevated on labs done in November 2024 as well in the setting of alcohol use), remainder of LFTs normal, no elevation of lipase, urine pregnancy test negative, COVID and flu negative.  Continue IV fluid hydration and antiemetic as needed.  High anion gap metabolic acidosis/ketoacidosis Likely  starvation ketoacidosis.  No hyperglycemia to suggest DKA.  Lactate trended down with IV fluids.  Continue IV fluid hydration with normal saline and monitor metabolic panel.  Recurrent hypoglycemia in the setting of insulin -dependent type 2 diabetes Patient hypoglycemic in the setting of vomiting for several days and not being able to tolerate any p.o. intake.  She has not taken her home insulin  for almost 2 days.  Glucose as low as 20s and was given oral dextrose  multiple times.  Most recent CBG in the 90s.  Hold insulin  and continue CBG checks every 2 hours for now until her oral intake improves.  Alcohol withdrawal Patient tachycardic and tremulous in the setting of not being able to drink alcohol.  She normally drinks 4-5 shots of liquor about 5 times a week.  Placed on CIWA protocol; Ativan  as needed.  Thiamine , folate, and multivitamin.  Sinus tachycardia Likely multifactorial from dehydration and alcohol withdrawal.  Heart rate initially in the 140s in the ED, now improved to 110s after patient received 2 L IV fluids and doses of Ativan .  TSH suppressed, check free T4 level.  No signs of infection.  No dyspnea, chest pain, or hypoxia to suggest PE.  Continue IV fluid hydration and Ativan  as needed for alcohol withdrawal.  Headache Likely related to caffeine  withdrawal.  No focal neurodeficit on exam.  A dose of IV morphine  ordered as patient is not able to tolerate taking pills at this time.  Hypertension Currently normotensive.  Resume home medication when she is able to tolerate p.o. intake.  GERD IV Protonix  40 mg daily.  DVT prophylaxis: SCDs Code Status: Full Code (discussed with the patient) Family Communication: Boyfriend at bedside. Level of care: Step Down Unit Admission status: It is my clinical opinion that referral for OBSERVATION is reasonable and necessary in this patient based on the above information provided. The aforementioned taken together are felt to place the  patient at high risk for further clinical deterioration. However, it is anticipated that the patient may be medically stable for discharge from the hospital within 24 to 48 hours.  Editha Ram MD [facility]  If 7PM-7AM, please contact night-coverage [email or web address]  01/18/2023, 2:46 AM

## 2023-01-18 NOTE — Plan of Care (Signed)
  Problem: Health Behavior/Discharge Planning: Goal: Ability to manage health-related needs will improve Outcome: Progressing   Problem: Clinical Measurements: Goal: Ability to maintain clinical measurements within normal limits will improve Outcome: Progressing Goal: Diagnostic test results will improve Outcome: Progressing   Problem: Elimination: Goal: Will not experience complications related to urinary retention Outcome: Progressing

## 2023-01-19 DIAGNOSIS — F10930 Alcohol use, unspecified with withdrawal, uncomplicated: Secondary | ICD-10-CM

## 2023-01-19 DIAGNOSIS — R519 Headache, unspecified: Secondary | ICD-10-CM

## 2023-01-19 DIAGNOSIS — K529 Noninfective gastroenteritis and colitis, unspecified: Secondary | ICD-10-CM | POA: Diagnosis not present

## 2023-01-19 LAB — CBC
HCT: 34 % — ABNORMAL LOW (ref 36.0–46.0)
Hemoglobin: 11 g/dL — ABNORMAL LOW (ref 12.0–15.0)
MCH: 28 pg (ref 26.0–34.0)
MCHC: 32.4 g/dL (ref 30.0–36.0)
MCV: 86.5 fL (ref 80.0–100.0)
Platelets: 258 10*3/uL (ref 150–400)
RBC: 3.93 MIL/uL (ref 3.87–5.11)
RDW: 16.6 % — ABNORMAL HIGH (ref 11.5–15.5)
WBC: 5.7 10*3/uL (ref 4.0–10.5)
nRBC: 0 % (ref 0.0–0.2)

## 2023-01-19 LAB — COMPREHENSIVE METABOLIC PANEL
ALT: 36 U/L (ref 0–44)
AST: 66 U/L — ABNORMAL HIGH (ref 15–41)
Albumin: 3.2 g/dL — ABNORMAL LOW (ref 3.5–5.0)
Alkaline Phosphatase: 63 U/L (ref 38–126)
Anion gap: 11 (ref 5–15)
BUN: 11 mg/dL (ref 6–20)
CO2: 24 mmol/L (ref 22–32)
Calcium: 8.1 mg/dL — ABNORMAL LOW (ref 8.9–10.3)
Chloride: 97 mmol/L — ABNORMAL LOW (ref 98–111)
Creatinine, Ser: 0.74 mg/dL (ref 0.44–1.00)
GFR, Estimated: >60 mL/min (ref >60)
Glucose, Bld: 135 mg/dL — ABNORMAL HIGH (ref 70–99)
Potassium: 3.7 mmol/L (ref 3.5–5.1)
Sodium: 132 mmol/L — ABNORMAL LOW (ref 135–145)
Total Bilirubin: 1.6 mg/dL — ABNORMAL HIGH (ref 0.0–1.2)
Total Protein: 6.7 g/dL (ref 6.5–8.1)

## 2023-01-19 LAB — GLUCOSE, CAPILLARY
Glucose-Capillary: 125 mg/dL — ABNORMAL HIGH (ref 70–99)
Glucose-Capillary: 128 mg/dL — ABNORMAL HIGH (ref 70–99)
Glucose-Capillary: 132 mg/dL — ABNORMAL HIGH (ref 70–99)
Glucose-Capillary: 134 mg/dL — ABNORMAL HIGH (ref 70–99)
Glucose-Capillary: 139 mg/dL — ABNORMAL HIGH (ref 70–99)
Glucose-Capillary: 154 mg/dL — ABNORMAL HIGH (ref 70–99)
Glucose-Capillary: 219 mg/dL — ABNORMAL HIGH (ref 70–99)

## 2023-01-19 LAB — MAGNESIUM: Magnesium: 1.3 mg/dL — ABNORMAL LOW (ref 1.7–2.4)

## 2023-01-19 LAB — CBG MONITORING, ED: Glucose-Capillary: 24 mg/dL — CL (ref 70–99)

## 2023-01-19 MED ORDER — BUTALBITAL-APAP-CAFFEINE 50-325-40 MG PO TABS
1.0000 | ORAL_TABLET | Freq: Four times a day (QID) | ORAL | Status: DC | PRN
  Administered 2023-01-19 – 2023-01-20 (×2): 1 via ORAL
  Filled 2023-01-19 (×2): qty 1

## 2023-01-19 MED ORDER — MAGNESIUM OXIDE -MG SUPPLEMENT 400 (240 MG) MG PO TABS
400.0000 mg | ORAL_TABLET | Freq: Two times a day (BID) | ORAL | Status: DC
  Administered 2023-01-19 – 2023-01-20 (×2): 400 mg via ORAL
  Filled 2023-01-19 (×3): qty 1

## 2023-01-19 MED ORDER — PANTOPRAZOLE SODIUM 40 MG PO TBEC
40.0000 mg | DELAYED_RELEASE_TABLET | Freq: Every day | ORAL | Status: DC
  Administered 2023-01-20: 40 mg via ORAL
  Filled 2023-01-19: qty 1

## 2023-01-19 MED ORDER — MAGNESIUM SULFATE 2 GM/50ML IV SOLN
2.0000 g | Freq: Once | INTRAVENOUS | Status: AC
  Administered 2023-01-19: 2 g via INTRAVENOUS
  Filled 2023-01-19: qty 50
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
## 2023-01-19 NOTE — Progress Notes (Addendum)
 [facility] PROGRESS NOTE   Yolanda Flores FMW:[id number] DOB: [birth year] DOA: 01/17/2023  PCP: Vicci Barnie [name], MD  Brief History: 38 y.o. female with medical history significant of insulin -dependent type 2 diabetes, hypertension, hyperlipidemia, alcohol use disorder, pancreatitis, GERD presented to ED with complaints of nausea, vomiting, and diarrhea.  Found to be hypoglycemic by EMS with CBG 37.  She was given 15 g of oral glucose with improvement of CBG 74 initially but then it dropped to 46 again.  She was also noted to be tachycardic and had lactic acidosis.  She was hospitalized for further management.  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    Consultants: None  Procedures: None    Subjective/Interval History: Patient mentions that headache is better.  She was told about her CT findings and was reassured.  Overall she feels better.  No nausea vomiting.  Tolerated her diet.  Diarrhea has resolved.  Still feels a bit anxious.    Assessment/Plan:  Acute gastroenteritis Was exposed to multiple sick contacts at the place where she works.  Diarrhea appears to have subsided.  Came in with intractable nausea vomiting. Symptoms have been improving.  Diarrhea has resolved.   Mildly abnormal AST and bilirubin is noted.  Lipase was normal. Lactic acid level was noted to have improved.  Continue supportive treatment.   Seems to be adequately hydrated.  Okay to discontinue IV fluids.  Lactic acidosis Most likely secondary to hypovolemia.  Improvement noted.  Continue to monitor.  Recurrent hypoglycemia in the setting of insulin -dependent diabetes mellitus type 2 Hypoglycemia likely due to acute illness and poor oral intake.   Glucose levels have improved.  Will decrease the frequency of CBG monitoring.   Last HbA1c was 11.3 in October 2024.  Repeat level is pending.   Depending on her CBG trends  over the next 24 hours we can determine treatment plan.    Headache CT head did not show any acute findings.  Patient without any focal neurological deficits.  Fioricet  can be tried.    History of alcohol abuse with withdrawal symptoms Continue with CIWA protocol.  Tachycardia has improved.  Blood pressures improved.   Thiamine  folic acid  and multivitamins.  Essential hypertension/sinus tachycardia Blood pressure has improved.  Currently on lisinopril  alone.  Also on hydralazine  as needed.    Hypomagnesemia Will be supplemented.  Recheck labs tomorrow morning.  Low TSH No known history of hypothyroidism.  Free T4 level is normal at 0.64.  Likely sick euthyroid.  GERD Continue PPI  Obesity Estimated body mass index is 33.6 kg/m as calculated from the following:   Height as of this encounter: 5' 7 (1.702 m).   Weight as of this encounter: 97.3 kg.   DVT Prophylaxis: SCDs Code Status: Full code Family Communication: Discussed with patient Disposition Plan: Hopefully home when improved      Medications: Scheduled:  Chlorhexidine  Gluconate Cloth  6 each Topical Q0600   folic acid   1 mg Oral Daily   lisinopril   5 mg Oral Daily   LORazepam   0-4 mg Oral Q6H   Followed by   NOREEN ON 01/20/2023] LORazepam   0-4 mg Oral Q12H   magnesium  oxide  400 mg Oral BID   multivitamin with minerals  1 tablet Oral Daily   pantoprazole  (PROTONIX ) IV  40 mg Intravenous Daily   sodium chloride  flush  3 mL Intravenous Q12H   thiamine   100  mg Oral Daily   Or   thiamine   100 mg Intravenous Daily   Continuous:  magnesium  sulfate bolus IVPB 2 g (01/19/23 1006)   PRN:acetaminophen  **OR** acetaminophen , butalbital -acetaminophen -caffeine , hydrALAZINE , LORazepam  **OR** LORazepam , naLOXone  (NARCAN )  injection, ondansetron  (ZOFRAN ) IV, mouth rinse, mouth rinse, oxyCODONE , sodium chloride  flush  Antibiotics: Anti-infectives (From admission, onward)    None       Objective:  Vital  Signs  Vitals:   01/19/23 0335 01/19/23 0500 01/19/23 0600 01/19/23 0800  BP:   (!) 144/96 120/77  Pulse:   97 84  Resp:   18 (!) 8  Temp: 98.4 F (36.9 C)     TempSrc: Oral     SpO2:   97% 95%  Weight:  97.3 kg    Height:        Intake/Output Summary (Last 24 hours) at 01/19/2023 1011 Last data filed at 01/19/2023 0631 Gross per 24 hour  Intake 2289.36 ml  Output --  Net 2289.36 ml   Filed Weights   01/17/23 1551 01/18/23 0214 01/19/23 0500  Weight: 99.3 kg 92.3 kg 97.3 kg    General appearance: Awake alert.  In no distress Resp: Clear to auscultation bilaterally.  Normal effort Cardio: S1-S2 is normal regular.  No S3-S4.  No rubs murmurs or bruit GI: Abdomen is soft.  Nontender nondistended.  Bowel sounds are present normal.  No masses organomegaly Extremities: No edema.  Full range of motion of lower extremities. Neurologic: Alert and oriented x3.  No focal neurological deficits.     Lab Results:  Data Reviewed: I have personally reviewed following labs and reports of the imaging studies  CBC: Recent Labs  Lab 01/17/23 1541 01/19/23 0331  WBC 8.1 5.7  HGB 13.3 11.0*  HCT 39.0 34.0*  MCV 83.3 86.5  PLT 434* 258    Basic Metabolic Panel: Recent Labs  Lab 01/17/23 1541 01/18/23 0526 01/19/23 0331  NA 137 136 132*  K 4.4 4.1 3.7  CL 94* 99 97*  CO2 17* 23 24  GLUCOSE 71 92 135*  BUN 13 13 11   CREATININE 0.79 0.66 0.74  CALCIUM  9.4 8.4* 8.1*  MG  --   --  1.3*    GFR: Estimated Creatinine Clearance: 117.6 mL/min (by C-G formula based on SCr of 0.74 mg/dL).  Liver Function Tests: Recent Labs  Lab 01/17/23 1541 01/18/23 0526 01/19/23 0331  AST 67* 60* 66*  ALT 30 31 36  ALKPHOS 93 69 63  BILITOT 1.1 1.5* 1.6*  PROT 8.4* 7.0 6.7  ALBUMIN  4.4 3.4* 3.2*    Recent Labs  Lab 01/17/23 1541  LIPASE 10*   CBG: Recent Labs  Lab 01/19/23 0015 01/19/23 0204 01/19/23 0357 01/19/23 0602 01/19/23 0730  GLUCAP 139* 132* 134* 128* 125*      Thyroid Function Tests: Recent Labs    01/17/23 1706 01/18/23 0526  TSH 0.215*  --   FREET4  --  0.64     Recent Results (from the past 240 hours)  Resp panel by RT-PCR (RSV, Flu A&B, Covid) Anterior Nasal Swab     Status: None   Collection Time: 01/17/23  5:06 PM   Specimen: Anterior Nasal Swab  Result Value Ref Range Status   SARS Coronavirus 2 by RT PCR NEGATIVE NEGATIVE Final    Comment: (NOTE) SARS-CoV-2 target nucleic acids are NOT DETECTED.  The SARS-CoV-2 RNA is generally detectable in upper respiratory specimens during the acute phase of infection. The lowest concentration of SARS-CoV-2 viral copies  this assay can detect is 138 copies/mL. A negative result does not preclude SARS-Cov-2 infection and should not be used as the sole basis for treatment or other patient management decisions. A negative result may occur with  improper specimen collection/handling, submission of specimen other than nasopharyngeal swab, presence of viral mutation(s) within the areas targeted by this assay, and inadequate number of viral copies(<138 copies/mL). A negative result must be combined with clinical observations, patient history, and epidemiological information. The expected result is Negative.  Fact Sheet for Patients:  bloggercourse.com  Fact Sheet for Healthcare Providers:  seriousbroker.it  This test is no t yet approved or cleared by the [place]  FDA and  has been authorized for detection and/or diagnosis of SARS-CoV-2 by FDA under an Emergency Use Authorization (EUA). This EUA will remain  in effect (meaning this test can be used) for the duration of the COVID-19 declaration under Section 564(b)(1) of the Act, 21 U.S.C.section 360bbb-3(b)(1), unless the authorization is terminated  or revoked sooner.       Influenza A by PCR NEGATIVE NEGATIVE Final   Influenza B by PCR NEGATIVE NEGATIVE Final    Comment:  (NOTE) The Xpert Xpress SARS-CoV-2/FLU/RSV plus assay is intended as an aid in the diagnosis of influenza from Nasopharyngeal swab specimens and should not be used as a sole basis for treatment. Nasal washings and aspirates are unacceptable for Xpert Xpress SARS-CoV-2/FLU/RSV testing.  Fact Sheet for Patients: bloggercourse.com  Fact Sheet for Healthcare Providers: seriousbroker.it  This test is not yet approved or cleared by the [place]  FDA and has been authorized for detection and/or diagnosis of SARS-CoV-2 by FDA under an Emergency Use Authorization (EUA). This EUA will remain in effect (meaning this test can be used) for the duration of the COVID-19 declaration under Section 564(b)(1) of the Act, 21 U.S.C. section 360bbb-3(b)(1), unless the authorization is terminated or revoked.     Resp Syncytial Virus by PCR NEGATIVE NEGATIVE Final    Comment: (NOTE) Fact Sheet for Patients: bloggercourse.com  Fact Sheet for Healthcare Providers: seriousbroker.it  This test is not yet approved or cleared by the [place]  FDA and has been authorized for detection and/or diagnosis of SARS-CoV-2 by FDA under an Emergency Use Authorization (EUA). This EUA will remain in effect (meaning this test can be used) for the duration of the COVID-19 declaration under Section 564(b)(1) of the Act, 21 U.S.C. section 360bbb-3(b)(1), unless the authorization is terminated or revoked.  Performed at [facility], [address], [place], [place] [address]   MRSA Next Gen by PCR, Nasal     Status: None   Collection Time: 01/18/23  7:35 AM  Result Value Ref Range Status   MRSA by PCR Next Gen NOT DETECTED NOT DETECTED Final    Comment: (NOTE) The GeneXpert MRSA Assay (FDA approved for NASAL specimens only), is one component of a comprehensive MRSA colonization  surveillance program. It is not intended to diagnose MRSA infection nor to guide or monitor treatment for MRSA infections. Test performance is not FDA approved in patients less than 5 years old. Performed at [facility], [address], [place], [place] [address]       Radiology Studies: CT HEAD WO CONTRAST ( ) Result Date: 01/18/2023 CLINICAL DATA:  Headache, sudden and severe EXAM: CT HEAD WITHOUT CONTRAST TECHNIQUE: Contiguous axial images were obtained from the base of the skull through the vertex without intravenous contrast. RADIATION DOSE REDUCTION: This exam was performed according to the departmental dose-optimization  program which includes automated exposure control, adjustment of the mA and/or kV according to patient size and/or use of iterative reconstruction technique. COMPARISON:  None Available. FINDINGS: Brain: No evidence of acute infarction, hemorrhage, hydrocephalus, extra-axial collection or mass lesion/mass effect.Nonspecific scattered dural calcifications, usually considered incidental. Vascular: No hyperdense vessel or unexpected calcification. Skull: Normal. Negative for fracture or focal lesion. Sinuses/Orbits: Mucosal thickening in the ethmoid and maxillary sinuses. No sinus fluid level. IMPRESSION: 1. No acute intracranial finding. 2. Mild mucosal thickening in the paranasal sinuses. Electronically Signed   By: Dorn Roulette M.D.   On: 01/18/2023 09:28   DG Chest Portable 1 View Result Date: 01/17/2023 CLINICAL DATA:  Hypoglycemia, search for occult infection EXAM: PORTABLE CHEST 1 VIEW COMPARISON:  12/12/2022 FINDINGS: The heart size and mediastinal contours are within normal limits. Both lungs are clear. The visualized skeletal structures are unremarkable. IMPRESSION: No acute abnormality of the lungs in AP portable projection. Electronically Signed   By: Marolyn JONETTA Jaksch M.D.   On: 01/17/2023 17:29       LOS: 1 day   Florestine Carmical  [facility] Pager on [email or web address]  01/19/2023, 10:11 AM

## 2023-01-19 NOTE — Plan of Care (Signed)
   Problem: Education: Goal: Knowledge of General Education information will improve Description: Including pain rating scale, medication(s)/side effects and non-pharmacologic comfort measures Outcome: Progressing   Problem: Health Behavior/Discharge Planning: Goal: Ability to manage health-related needs will improve Outcome: Progressing   Problem: Clinical Measurements: Goal: Ability to maintain clinical measurements within normal limits will improve Outcome: Progressing   Problem: Activity: Goal: Risk for activity intolerance will decrease Outcome: Progressing   Problem: Nutrition: Goal: Adequate nutrition will be maintained Outcome: Progressing   Problem: Coping: Goal: Level of anxiety will decrease Outcome: Progressing   Problem: Elimination: Goal: Will not experience complications related to bowel motility Outcome: Progressing   Problem: Pain Management: Goal: General experience of comfort will improve Outcome: Progressing   Problem: Safety: Goal: Ability to remain free from injury will improve Outcome: Progressing

## 2023-01-19 NOTE — Plan of Care (Signed)

## 2023-01-20 DIAGNOSIS — Z794 Long term (current) use of insulin: Secondary | ICD-10-CM | POA: Diagnosis not present

## 2023-01-20 DIAGNOSIS — E119 Type 2 diabetes mellitus without complications: Secondary | ICD-10-CM | POA: Diagnosis not present

## 2023-01-20 LAB — COMPREHENSIVE METABOLIC PANEL
ALT: 35 U/L (ref 0–44)
AST: 42 U/L — ABNORMAL HIGH (ref 15–41)
Albumin: 3.4 g/dL — ABNORMAL LOW (ref 3.5–5.0)
Alkaline Phosphatase: 61 U/L (ref 38–126)
Anion gap: 10 (ref 5–15)
BUN: 9 mg/dL (ref 6–20)
CO2: 25 mmol/L (ref 22–32)
Calcium: 8.6 mg/dL — ABNORMAL LOW (ref 8.9–10.3)
Chloride: 99 mmol/L (ref 98–111)
Creatinine, Ser: 0.57 mg/dL (ref 0.44–1.00)
GFR, Estimated: >60 mL/min (ref >60)
Glucose, Bld: 201 mg/dL — ABNORMAL HIGH (ref 70–99)
Potassium: 3.7 mmol/L (ref 3.5–5.1)
Sodium: 134 mmol/L — ABNORMAL LOW (ref 135–145)
Total Bilirubin: 1.2 mg/dL (ref 0.0–1.2)
Total Protein: 6.8 g/dL (ref 6.5–8.1)

## 2023-01-20 LAB — CBC
HCT: 33.1 % — ABNORMAL LOW (ref 36.0–46.0)
Hemoglobin: 10.8 g/dL — ABNORMAL LOW (ref 12.0–15.0)
MCH: 28.3 pg (ref 26.0–34.0)
MCHC: 32.6 g/dL (ref 30.0–36.0)
MCV: 86.6 fL (ref 80.0–100.0)
Platelets: 244 10*3/uL (ref 150–400)
RBC: 3.82 MIL/uL — ABNORMAL LOW (ref 3.87–5.11)
RDW: 16.3 % — ABNORMAL HIGH (ref 11.5–15.5)
WBC: 6 10*3/uL (ref 4.0–10.5)
nRBC: 0 % (ref 0.0–0.2)

## 2023-01-20 LAB — HEMOGLOBIN A1C
Hgb A1c MFr Bld: 7.9 % — ABNORMAL HIGH (ref 4.8–5.6)
Mean Plasma Glucose: 180 mg/dL

## 2023-01-20 LAB — GLUCOSE, CAPILLARY: Glucose-Capillary: 241 mg/dL — ABNORMAL HIGH (ref 70–99)

## 2023-01-20 LAB — MAGNESIUM: Magnesium: 1.9 mg/dL (ref 1.7–2.4)

## 2023-01-20 MED ORDER — THIAMINE HCL 100 MG PO TABS: 100.0000 mg | ORAL_TABLET | Freq: Every day | ORAL | 1 refills | Status: AC

## 2023-01-20 MED ORDER — FOLIC ACID 1 MG PO TABS: 1.0000 mg | ORAL_TABLET | Freq: Every day | ORAL | 0 refills | Status: DC

## 2023-01-20 MED ORDER — MAGNESIUM OXIDE -MG SUPPLEMENT 400 (240 MG) MG PO TABS: 400.0000 mg | ORAL_TABLET | Freq: Two times a day (BID) | ORAL | 0 refills | Status: DC

## 2023-01-20 NOTE — Progress Notes (Signed)
 Nurse reviewed discharge instructions with pt. Pt verbalized understanding of discharge instructions, follow up appointment and new medications.  No concerns at time of discharge.  Brother to pick pt up.  Work note given to pt.

## 2023-01-20 NOTE — Discharge Summary (Signed)
 [facility]  Physician Discharge Summary   Patient ID: Yolanda Flores MRN: [id number] DOB/AGE: 1986/01/10 38 y.o.  Admit date: 01/17/2023 Discharge date: 01/20/2023    PCP: Vicci Barnie [name], MD  DISCHARGE DIAGNOSES:  Acute gastroenteritis Hypoglycemia in the setting of insulin  dependent type 2 diabetes mellitus (HCC)   Alcohol withdrawal (HCC)   Headache   RECOMMENDATIONS FOR OUTPATIENT FOLLOW UP: Patient has to follow-up with her PCP for further management of her diabetes Check thyroid function test in 3 to 4 weeks.   Home Health: None Equipment/Devices: None  CODE STATUS: Full code  DISCHARGE CONDITION: fair  Diet recommendation: Modified carbohydrate  INITIAL HISTORY: 38 y.o. female with medical history significant of insulin -dependent type 2 diabetes, hypertension, hyperlipidemia, alcohol use disorder, pancreatitis, GERD presented to ED with complaints of nausea, vomiting, and diarrhea.  Found to be hypoglycemic by EMS with CBG 37.  She was given 15 g of oral glucose with improvement of CBG 74 initially but then it dropped to 46 again.  She was also noted to be tachycardic and had lactic acidosis.  She was hospitalized for further management.  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    HOSPITAL COURSE:   Acute gastroenteritis Was exposed to multiple sick contacts at the place where she works.   Symptoms have been improving.  Diarrhea has resolved.   Mildly abnormal AST and bilirubin is noted.  Lipase was normal. Lactic acid level was noted to have improved.   Patient tolerating her diet.  Still with occasional nausea but much improved.     Lactic acidosis Most likely secondary to hypovolemia.  Improvement noted.     Recurrent hypoglycemia in the setting of insulin -dependent diabetes mellitus type 2 Hypoglycemia likely due to acute illness and poor oral intake.   Patient was  placed on D5 infusion.  HbA1c 7.9. CBGs are now uptrending.  She was told to take half the usual amount of her basal insulin  tonight and then start taking her usual amount from tomorrow as long as her oral intake is adequate.  She has a glucometer and home and she will check her CBGs.   Headache CT head did not show any acute findings.  Patient without any focal neurological deficits.  Improved with Fioricet .   History of alcohol abuse with withdrawal symptoms Was placed on CIWA protocol.  Improved.   Essential hypertension/sinus tachycardia    Hypomagnesemia    Low TSH No known history of hypothyroidism.  Free T4 level is normal at 0.64.  Likely sick euthyroid.   GERD Continue PPI   Obesity Estimated body mass index is 33.6 kg/m as calculated from the following:   Height as of this encounter: 5' 7 (1.702 m).   Weight as of this encounter: 97.3 kg.     Patient is stable.  Okay for discharge home today.   PERTINENT LABS:  The results of significant diagnostics from this hospitalization (including imaging, microbiology, ancillary and laboratory) are listed below for reference.    Microbiology: Recent Results (from the past 240 hours)  Resp panel by RT-PCR (RSV, Flu A&B, Covid) Anterior Nasal Swab     Status: None   Collection Time: 01/17/23  5:06 PM   Specimen: Anterior Nasal Swab  Result Value Ref Range Status   SARS Coronavirus 2 by RT PCR NEGATIVE NEGATIVE Final    Comment: (NOTE) SARS-CoV-2 target nucleic acids are NOT DETECTED.  The SARS-CoV-2  RNA is generally detectable in upper respiratory specimens during the acute phase of infection. The lowest concentration of SARS-CoV-2 viral copies this assay can detect is 138 copies/mL. A negative result does not preclude SARS-Cov-2 infection and should not be used as the sole basis for treatment or other patient management decisions. A negative result may occur with  improper specimen collection/handling, submission of  specimen other than nasopharyngeal swab, presence of viral mutation(s) within the areas targeted by this assay, and inadequate number of viral copies(<138 copies/mL). A negative result must be combined with clinical observations, patient history, and epidemiological information. The expected result is Negative.  Fact Sheet for Patients:  bloggercourse.com  Fact Sheet for Healthcare Providers:  seriousbroker.it  This test is no t yet approved or cleared by the [place]  FDA and  has been authorized for detection and/or diagnosis of SARS-CoV-2 by FDA under an Emergency Use Authorization (EUA). This EUA will remain  in effect (meaning this test can be used) for the duration of the COVID-19 declaration under Section 564(b)(1) of the Act, 21 U.S.C.section 360bbb-3(b)(1), unless the authorization is terminated  or revoked sooner.       Influenza A by PCR NEGATIVE NEGATIVE Final   Influenza B by PCR NEGATIVE NEGATIVE Final    Comment: (NOTE) The Xpert Xpress SARS-CoV-2/FLU/RSV plus assay is intended as an aid in the diagnosis of influenza from Nasopharyngeal swab specimens and should not be used as a sole basis for treatment. Nasal washings and aspirates are unacceptable for Xpert Xpress SARS-CoV-2/FLU/RSV testing.  Fact Sheet for Patients: bloggercourse.com  Fact Sheet for Healthcare Providers: seriousbroker.it  This test is not yet approved or cleared by the [place]  FDA and has been authorized for detection and/or diagnosis of SARS-CoV-2 by FDA under an Emergency Use Authorization (EUA). This EUA will remain in effect (meaning this test can be used) for the duration of the COVID-19 declaration under Section 564(b)(1) of the Act, 21 U.S.C. section 360bbb-3(b)(1), unless the authorization is terminated or revoked.     Resp Syncytial Virus by PCR NEGATIVE NEGATIVE Final     Comment: (NOTE) Fact Sheet for Patients: bloggercourse.com  Fact Sheet for Healthcare Providers: seriousbroker.it  This test is not yet approved or cleared by the [place]  FDA and has been authorized for detection and/or diagnosis of SARS-CoV-2 by FDA under an Emergency Use Authorization (EUA). This EUA will remain in effect (meaning this test can be used) for the duration of the COVID-19 declaration under Section 564(b)(1) of the Act, 21 U.S.C. section 360bbb-3(b)(1), unless the authorization is terminated or revoked.  Performed at [facility], [address] [place], [place] [address]   MRSA Next Gen by PCR, Nasal     Status: None   Collection Time: 01/18/23  7:35 AM  Result Value Ref Range Status   MRSA by PCR Next Gen NOT DETECTED NOT DETECTED Final    Comment: (NOTE) The GeneXpert MRSA Assay (FDA approved for NASAL specimens only), is one component of a comprehensive MRSA colonization surveillance program. It is not intended to diagnose MRSA infection nor to guide or monitor treatment for MRSA infections. Test performance is not FDA approved in patients less than 9 years old. Performed at [facility], [address], [place], [place] [address]      Labs:   Basic Metabolic Panel: Recent Labs  Lab 01/17/23 1541 01/18/23 0526 01/19/23 0331 01/20/23 0332  NA 137 136 132* 134*  K 4.4 4.1 3.7 3.7  CL  94* 99 97* 99  CO2 17* 23 24 25   GLUCOSE 71 92 135* 201*  BUN 13 13 11 9   CREATININE 0.79 0.66 0.74 0.57  CALCIUM  9.4 8.4* 8.1* 8.6*  MG  --   --  1.3* 1.9   Liver Function Tests: Recent Labs  Lab 01/17/23 1541 01/18/23 0526 01/19/23 0331 01/20/23 0332  AST 67* 60* 66* 42*  ALT 30 31 36 35  ALKPHOS 93 69 63 61  BILITOT 1.1 1.5* 1.6* 1.2  PROT 8.4* 7.0 6.7 6.8  ALBUMIN  4.4 3.4* 3.2* 3.4*   Recent Labs  Lab 01/17/23 1541  LIPASE 10*   CBC: Recent Labs   Lab 01/17/23 1541 01/19/23 0331 01/20/23 0332  WBC 8.1 5.7 6.0  HGB 13.3 11.0* 10.8*  HCT 39.0 34.0* 33.1*  MCV 83.3 86.5 86.6  PLT 434* 258 244    CBG: Recent Labs  Lab 01/19/23 0602 01/19/23 0730 01/19/23 1137 01/19/23 1635 01/20/23 0909  GLUCAP 128* 125* 154* 219* 241*     IMAGING STUDIES CT HEAD WO CONTRAST ( ) Result Date: 01/18/2023 CLINICAL DATA:  Headache, sudden and severe EXAM: CT HEAD WITHOUT CONTRAST TECHNIQUE: Contiguous axial images were obtained from the base of the skull through the vertex without intravenous contrast. RADIATION DOSE REDUCTION: This exam was performed according to the departmental dose-optimization program which includes automated exposure control, adjustment of the mA and/or kV according to patient size and/or use of iterative reconstruction technique. COMPARISON:  None Available. FINDINGS: Brain: No evidence of acute infarction, hemorrhage, hydrocephalus, extra-axial collection or mass lesion/mass effect.Nonspecific scattered dural calcifications, usually considered incidental. Vascular: No hyperdense vessel or unexpected calcification. Skull: Normal. Negative for fracture or focal lesion. Sinuses/Orbits: Mucosal thickening in the ethmoid and maxillary sinuses. No sinus fluid level. IMPRESSION: 1. No acute intracranial finding. 2. Mild mucosal thickening in the paranasal sinuses. Electronically Signed   By: Dorn Roulette M.D.   On: 01/18/2023 09:28   DG Chest Portable 1 View Result Date: 01/17/2023 CLINICAL DATA:  Hypoglycemia, search for occult infection EXAM: PORTABLE CHEST 1 VIEW COMPARISON:  12/12/2022 FINDINGS: The heart size and mediastinal contours are within normal limits. Both lungs are clear. The visualized skeletal structures are unremarkable. IMPRESSION: No acute abnormality of the lungs in AP portable projection. Electronically Signed   By: Marolyn JONETTA Jaksch M.D.   On: 01/17/2023 17:29    DISCHARGE EXAMINATION: Vitals:   01/19/23 1920  01/19/23 2300 01/20/23 0128 01/20/23 0459  BP:  (!) 128/99 (!) 130/98 129/87  Pulse:  80 93 74  Resp:  17 17 17   Temp: 98 F (36.7 C) 98.3 F (36.8 C) 98.2 F (36.8 C) 98.2 F (36.8 C)  TempSrc: Oral Oral Oral Oral  SpO2:  98% 99% 100%  Weight:      Height:       General appearance: Awake alert.  In no distress Resp: Clear to auscultation bilaterally.  Normal effort Cardio: S1-S2 is normal regular.  No S3-S4.  No rubs murmurs or bruit GI: Abdomen is soft.  Nontender nondistended.  Bowel sounds are present normal.  No masses organomegaly   DISPOSITION: Home  Discharge Instructions     Call MD for:  difficulty breathing, headache or visual disturbances   Complete by: As directed    Call MD for:  extreme fatigue   Complete by: As directed    Call MD for:  persistant dizziness or light-headedness   Complete by: As directed    Call MD for:  persistant nausea and  vomiting   Complete by: As directed    Call MD for:  severe uncontrolled pain   Complete by: As directed    Call MD for:  temperature >100.4   Complete by: As directed    Diet Carb Modified   Complete by: As directed    Discharge instructions   Complete by: As directed    Please take only 10 units of your long-acting insulin  tonight.  Please check your glucose levels 3 times a day before each meal.  You may resume your usual dose of insulin  from tomorrow as long as your glucose levels are greater than 150.  Please be sure to follow-up with your primary care provider in 1 to 2 weeks.  You were cared for by a hospitalist during your hospital stay. If you have any questions about your discharge medications or the care you received while you were in the hospital after you are discharged, you can call the unit and asked to speak with the hospitalist on call if the hospitalist that took care of you is not available. Once you are discharged, your primary care physician will handle any further medical issues. Please note that NO  REFILLS for any discharge medications will be authorized once you are discharged, as it is imperative that you return to your primary care physician (or establish a relationship with a primary care physician if you do not have one) for your aftercare needs so that they can reassess your need for medications and monitor your lab values. If you do not have a primary care physician, you can call [phone] for a physician referral.   Increase activity slowly   Complete by: As directed          Allergies as of 01/20/2023       Reactions   Ibuprofen  Nausea And Vomiting        Medication List     STOP taking these medications    fenofibrate  160 MG tablet   metFORMIN  1000 MG tablet Commonly known as: GLUCOPHAGE        TAKE these medications    aspirin -acetaminophen -caffeine  250-250-65 MG tablet Commonly known as: EXCEDRIN  MIGRAINE Take 2 tablets by mouth every 6 (six) hours as needed for headache.   atorvastatin  40 MG tablet Commonly known as: LIPITOR Take 1 tablet (40 mg total) by mouth daily.   Basaglar  Tempo Pen 100 UNIT/ML Sopn Generic drug: Insulin  Glargine w/ Trans Port Inject 22 Units into the skin daily.   folic acid  1 MG tablet Commonly known as: FOLVITE  Take 1 tablet (1 mg total) by mouth daily.   FreeStyle Libre 3 Sensor Misc Change Q 2 weeks   insulin  lispro 100 UNIT/ML KwikPen Commonly known as: HumaLOG  KwikPen 3 units subcut inj with the 2 largest meals of the day. What changed:  how much to take how to take this when to take this additional instructions   lisinopril  5 MG tablet Commonly known as: ZESTRIL  Take 1 tablet (5 mg total) by mouth daily.   magnesium  oxide 400 (240 Mg) MG tablet Commonly known as: MAG-OX Take 1 tablet (400 mg total) by mouth 2 (two) times daily.   multivitamin with minerals Tabs tablet Take 1 tablet by mouth daily.   omeprazole  20 MG capsule Commonly known as: PRILOSEC  Take 1 capsule (20 mg total) by mouth daily.    ondansetron  4 MG disintegrating tablet Commonly known as: ZOFRAN -ODT Take 1 tablet (4 mg total) by mouth every 8 (eight) hours as needed. What changed:  reasons to take this   pregabalin  25 MG capsule Commonly known as: Lyrica  Take 1 capsule (25 mg total) by mouth 2 (two) times daily. What changed:  when to take this reasons to take this   thiamine  100 MG tablet Commonly known as: VITAMIN B1 Take 1 tablet (100 mg total) by mouth daily.          Follow-up Information     Vicci Barnie [name], MD. Schedule an appointment as soon as possible for a visit in 1 week(s).   Specialty: Internal Medicine Why: post hospitalization follow up Contact information: [address] [place] [address] [phone]                 TOTAL DISCHARGE TIME: 35 minutes  Brylie Sneath  [facility] Pager on [email or web address]  01/21/2023, 10:39 AM

## 2023-01-21 ENCOUNTER — Telehealth: Payer: Self-pay

## 2023-01-21 NOTE — Transitions of Care (Post Inpatient/ED Visit) (Signed)
   01/21/2023  Name: Yolanda Flores MRN: [id number] DOB: November 17, 1985  Today's TOC FU Call Status: Today's TOC FU Call Status:: Unsuccessful Call (1st Attempt) Unsuccessful Call (1st Attempt) Date: 01/21/23  Attempted to reach the patient regarding the most recent Inpatient/ED visit.  Follow Up Plan: Additional outreach attempts will be made to reach the patient to complete the Transitions of Care (Post Inpatient/ED visit) call.   Signature Slater Diesel, RN

## 2023-01-22 ENCOUNTER — Telehealth: Payer: Self-pay

## 2023-01-22 NOTE — Telephone Encounter (Signed)
 Copied from CRM [id number]. Topic: General - Other >> Jan 22, 2023 11:26 AM Turkey B wrote: Reason for CRM: pt called in returning call to Erskine Squibb, I went back to connect her to Erskine Squibb but she wasn't answering. Please cb

## 2023-01-22 NOTE — Transitions of Care (Post Inpatient/ED Visit) (Signed)
   01/22/2023  Name: Yolanda Flores MRN: [id number] DOB: [birth year]  Today's TOC FU Call Status: Today's TOC FU Call Status:: Successful TOC FU Call Completed Unsuccessful Call (1st Attempt) Date: 01/21/23 Unsuccessful Call (2nd Attempt) Date: 01/22/23 [facility] FU Call Complete Date: 01/22/23 Patient's Name and Date of Birth confirmed.  Transition Care Management Follow-up Telephone Call Date of Discharge: 01/20/23 Discharge Facility: [facility]) Type of Discharge: Inpatient Admission Primary Inpatient Discharge Diagnosis:: dehydration How have you been since you were released from the hospital?: Better Any questions or concerns?: No  Items Reviewed: Did you receive and understand the discharge instructions provided?: Yes Medications obtained,verified, and reconciled?: Partial Review Completed Reason for Partial Mediation Review: She said she has all of her medications and did not have any questions about the med regime and did not need to review the med list.  She also has a glucometer Any new allergies since your discharge?: No Dietary orders reviewed?: Yes Type of Diet Ordered:: heart healthy low sodium, carb modified Do you have support at home?: Yes People in Home: significant other  Medications Reviewed Today: Medications Reviewed Today   Medications were not reviewed in this encounter     Home Care and Equipment/Supplies: Were Home Health Services Ordered?: No Any new equipment or medical supplies ordered?: No  Functional Questionnaire: Do you need assistance with bathing/showering or dressing?: No Do you need assistance with meal preparation?: No Do you need assistance with eating?: No Do you have difficulty maintaining continence: No Do you need assistance with getting out of bed/getting out of a chair/moving?: No Do you have difficulty managing or taking your medications?: No  Follow up appointments reviewed: PCP Follow-up appointment confirmed?: Yes Date of  PCP follow-up appointment?: 02/12/23 Follow-up Provider: [facility] Follow-up appointment confirmed?: NA Do you need transportation to your follow-up appointment?: No Do you understand care options if your condition(s) worsen?: Yes-patient verbalized understanding    SIGNATURE Slater Diesel, RN

## 2023-01-22 NOTE — Transitions of Care (Post Inpatient/ED Visit) (Signed)
   01/22/2023  Name: Yolanda Flores MRN: [id number] DOB: July 26, 1985  Today's TOC FU Call Status: Today's TOC FU Call Status:: Unsuccessful Call (2nd Attempt) Unsuccessful Call (1st Attempt) Date: 01/21/23 Unsuccessful Call (2nd Attempt) Date: 01/22/23  Attempted to reach the patient regarding the most recent Inpatient/ED visit.  Follow Up Plan: Additional outreach attempts will be made to reach the patient to complete the Transitions of Care (Post Inpatient/ED visit) call.   Signature  Slater Diesel, RN

## 2023-01-22 NOTE — Telephone Encounter (Signed)
 I have returned this call to the patient and completed a TOC call.

## 2023-02-09 LAB — HM DIABETES EYE EXAM

## 2023-02-11 ENCOUNTER — Encounter: Payer: Self-pay | Admitting: Internal Medicine

## 2023-02-11 DIAGNOSIS — E11319 Type 2 diabetes mellitus with unspecified diabetic retinopathy without macular edema: Secondary | ICD-10-CM | POA: Insufficient documentation

## 2023-02-12 ENCOUNTER — Inpatient Hospital Stay: Payer: Commercial Managed Care - HMO | Admitting: Internal Medicine

## 2023-02-26 ENCOUNTER — Inpatient Hospital Stay: Payer: Commercial Managed Care - HMO | Admitting: Physician Assistant

## 2023-02-26 NOTE — Progress Notes (Deleted)
 Patient ID: Yolanda Flores, female   DOB: [birth year], 38 y.o.   MRN: [id number]  After hospitalization 1/4 to 1/7 DISCHARGE DIAGNOSES:  Acute gastroenteritis Hypoglycemia in the setting of insulin dependent type 2 diabetes mellitus (HCC)   Alcohol withdrawal (HCC)   Headache  HOSPITAL COURSE:    Acute gastroenteritis Was exposed to multiple sick contacts at the place where she works.   Symptoms have been improving.  Diarrhea has resolved.   Mildly abnormal AST and bilirubin is noted.  Lipase was normal. Lactic acid level was noted to have improved.   Patient tolerating her diet.  Still with occasional nausea but much improved.     Lactic acidosis Most likely secondary to hypovolemia.  Improvement noted.     Recurrent hypoglycemia in the setting of insulin-dependent diabetes mellitus type 2 Hypoglycemia likely due to acute illness and poor oral intake.   Patient was placed on D5 infusion.  HbA1c 7.9. CBGs are now uptrending.  She was told to take half the usual amount of her basal insulin tonight and then start taking her usual amount from tomorrow as long as her oral intake is adequate.  She has a glucometer and home and she will check her CBGs.   Headache CT head did not show any acute findings.  Patient without any focal neurological deficits.  Improved with Fioricet.   History of alcohol abuse with withdrawal symptoms Was placed on CIWA protocol.  Improved.   Essential hypertension/sinus tachycardia     Hypomagnesemia     Low TSH No known history of hypothyroidism.  Free T4 level is normal at 0.64.  Likely sick euthyroid.   GERD Continue PPI   Obesity Estimated body mass index is 33.6 kg/m as calculated from the following:   Height as of this encounter: 5\' 7"  (1.702 m).   Weight as of this encounter: 97.3 kg.

## 2023-03-09 ENCOUNTER — Ambulatory Visit: Payer: Commercial Managed Care - HMO | Admitting: Internal Medicine

## 2023-03-19 ENCOUNTER — Ambulatory Visit: Payer: Commercial Managed Care - HMO | Admitting: Internal Medicine

## 2023-03-31 ENCOUNTER — Ambulatory Visit: Payer: Commercial Managed Care - HMO | Attending: Internal Medicine | Admitting: Internal Medicine

## 2023-03-31 VITALS — BP 129/86 | HR 110 | Temp 98.2°F | Ht 67.0 in | Wt 214.0 lb

## 2023-03-31 DIAGNOSIS — E669 Obesity, unspecified: Secondary | ICD-10-CM

## 2023-03-31 DIAGNOSIS — E785 Hyperlipidemia, unspecified: Secondary | ICD-10-CM

## 2023-03-31 DIAGNOSIS — E119 Type 2 diabetes mellitus without complications: Secondary | ICD-10-CM

## 2023-03-31 DIAGNOSIS — M79672 Pain in left foot: Secondary | ICD-10-CM

## 2023-03-31 DIAGNOSIS — M792 Neuralgia and neuritis, unspecified: Secondary | ICD-10-CM

## 2023-03-31 DIAGNOSIS — I10 Essential (primary) hypertension: Secondary | ICD-10-CM

## 2023-03-31 DIAGNOSIS — Z7984 Long term (current) use of oral hypoglycemic drugs: Secondary | ICD-10-CM

## 2023-03-31 DIAGNOSIS — Z794 Long term (current) use of insulin: Secondary | ICD-10-CM | POA: Diagnosis not present

## 2023-03-31 DIAGNOSIS — F1011 Alcohol abuse, in remission: Secondary | ICD-10-CM

## 2023-03-31 DIAGNOSIS — R1013 Epigastric pain: Secondary | ICD-10-CM

## 2023-03-31 DIAGNOSIS — R252 Cramp and spasm: Secondary | ICD-10-CM

## 2023-03-31 DIAGNOSIS — I152 Hypertension secondary to endocrine disorders: Secondary | ICD-10-CM

## 2023-03-31 DIAGNOSIS — E1169 Type 2 diabetes mellitus with other specified complication: Secondary | ICD-10-CM

## 2023-03-31 DIAGNOSIS — Z6833 Body mass index (BMI) 33.0-33.9, adult: Secondary | ICD-10-CM

## 2023-03-31 MED ORDER — OMEPRAZOLE 20 MG PO CPDR: 20.0000 mg | DELAYED_RELEASE_CAPSULE | Freq: Every day | ORAL | 3 refills | Status: DC

## 2023-03-31 MED ORDER — ATORVASTATIN CALCIUM 40 MG PO TABS: ORAL_TABLET | ORAL | 1 refills | Status: DC

## 2023-03-31 MED ORDER — LISINOPRIL 10 MG PO TABS: 10.0000 mg | ORAL_TABLET | Freq: Every day | ORAL | 1 refills | Status: DC

## 2023-03-31 MED ORDER — BASAGLAR TEMPO PEN 100 UNIT/ML ~~LOC~~ SOPN: 22.0000 [IU] | PEN_INJECTOR | Freq: Every day | SUBCUTANEOUS | 2 refills | Status: DC

## 2023-03-31 MED ORDER — PREGABALIN 50 MG PO CAPS: 50.0000 mg | ORAL_CAPSULE | Freq: Two times a day (BID) | ORAL | 3 refills | Status: DC
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
## 2023-04-01 ENCOUNTER — Encounter: Payer: Self-pay | Admitting: Internal Medicine

## 2023-04-01 LAB — COMPREHENSIVE METABOLIC PANEL
ALT: 11 IU/L (ref 0–32)
AST: 13 IU/L (ref 0–40)
Albumin: 4.5 g/dL (ref 3.9–4.9)
Alkaline Phosphatase: 86 IU/L (ref 44–121)
BUN/Creatinine Ratio: 14 (ref 9–23)
BUN: 13 mg/dL (ref 6–20)
Bilirubin Total: 0.5 mg/dL (ref 0.0–1.2)
CO2: 22 mmol/L (ref 20–29)
Calcium: 10.3 mg/dL — ABNORMAL HIGH (ref 8.7–10.2)
Chloride: 96 mmol/L (ref 96–106)
Creatinine, Ser: 0.94 mg/dL (ref 0.57–1.00)
Globulin, Total: 3 g/dL (ref 1.5–4.5)
Glucose: 160 mg/dL — ABNORMAL HIGH (ref 70–99)
Potassium: 4.8 mmol/L (ref 3.5–5.2)
Sodium: 135 mmol/L (ref 134–144)
Total Protein: 7.5 g/dL (ref 6.0–8.5)
eGFR: 81 mL/min/{1.73_m2} (ref >59)

## 2023-04-07 ENCOUNTER — Ambulatory Visit: Admitting: Podiatry

## 2023-04-08 ENCOUNTER — Encounter: Payer: Self-pay | Admitting: Podiatry

## 2023-04-08 ENCOUNTER — Ambulatory Visit: Admitting: Podiatry

## 2023-04-08 ENCOUNTER — Ambulatory Visit (INDEPENDENT_AMBULATORY_CARE_PROVIDER_SITE_OTHER)

## 2023-04-08 DIAGNOSIS — D3610 Benign neoplasm of peripheral nerves and autonomic nervous system, unspecified: Secondary | ICD-10-CM

## 2023-04-08 DIAGNOSIS — M7672 Peroneal tendinitis, left leg: Secondary | ICD-10-CM

## 2023-04-08 DIAGNOSIS — G5762 Lesion of plantar nerve, left lower limb: Secondary | ICD-10-CM | POA: Diagnosis not present

## 2023-04-08 DIAGNOSIS — M79672 Pain in left foot: Secondary | ICD-10-CM | POA: Diagnosis not present

## 2023-04-08 MED ORDER — DICLOFENAC SODIUM 75 MG PO TBEC: 75.0000 mg | DELAYED_RELEASE_TABLET | Freq: Two times a day (BID) | ORAL | 2 refills | Status: DC

## 2023-04-08 MED ORDER — TRIAMCINOLONE ACETONIDE 10 MG/ML IJ SUSP
10.0000 mg | Freq: Once | INTRAMUSCULAR | Status: AC
  Administered 2023-04-08: 10 mg via INTRA_ARTICULAR

## 2023-04-08 NOTE — Progress Notes (Signed)
 Subjective:   Patient ID: Yolanda Flores, female   DOB: 38 y.o.   MRN: [id number]   HPI Patient presents with a lot of pain in the outside of the left foot and had an injury 10 years ago and feels like it is giving her trouble since then.  Also has back issues which could be contributory and states that it has been somewhat hard to walk.  Patient does not currently smoke and tries to be active   Review of Systems  All other systems reviewed and are negative.       Objective:  Physical Exam Vitals and nursing note reviewed.  Constitutional:      Appearance: She is well-developed.  Pulmonary:     Effort: Pulmonary effort is normal.  Musculoskeletal:        General: Normal range of motion.  Skin:    General: Skin is warm.  Neurological:     Mental Status: She is alert.     Neurovascular status intact muscle strength adequate range of motion within normal limits with exquisite discomfort noted lateral side left foot with inflammation and fluid around the base of the fifth metatarsal.  Patient is found to have good digital perfusion well oriented x 3 and I did not note any nerve damage currently     Assessment:  Possibility for inflammatory condition versus neuropathic condition even though very difficult to make complete determination as to why this is been hurting for 10 years     Plan:  H&P reviewed and I went ahead today I did do sterile prep I injected the tendon complex and capsule left 3 mg Dexasone Kenalog 5 mg Xylocaine around the peroneal insertion base of fifth metatarsal explaining risk first.  I then applied fascial brace to lift up the lateral foot and we will see back as needed and hopefully this will give relief of symptoms  X-rays were negative for signs that there is bony pathology associated with this condition

## 2023-04-28 ENCOUNTER — Other Ambulatory Visit: Payer: Self-pay

## 2023-04-28 ENCOUNTER — Emergency Department (HOSPITAL_COMMUNITY)

## 2023-04-28 ENCOUNTER — Inpatient Hospital Stay (HOSPITAL_COMMUNITY)
Admission: EM | Admit: 2023-04-28 | Discharge: 2023-04-30 | DRG: 638 | Disposition: A | Discharge status: Home / Self Care | Attending: Internal Medicine | Admitting: Internal Medicine

## 2023-04-28 ENCOUNTER — Encounter (HOSPITAL_COMMUNITY): Payer: Self-pay

## 2023-04-28 DIAGNOSIS — R9431 Abnormal electrocardiogram [ECG] [EKG]: Secondary | ICD-10-CM | POA: Diagnosis present

## 2023-04-28 DIAGNOSIS — Y906 Blood alcohol level of 120-199 mg/100 ml: Secondary | ICD-10-CM | POA: Diagnosis present

## 2023-04-28 DIAGNOSIS — E1159 Type 2 diabetes mellitus with other circulatory complications: Secondary | ICD-10-CM | POA: Diagnosis present

## 2023-04-28 DIAGNOSIS — E0590 Thyrotoxicosis, unspecified without thyrotoxic crisis or storm: Secondary | ICD-10-CM | POA: Diagnosis present

## 2023-04-28 DIAGNOSIS — F1010 Alcohol abuse, uncomplicated: Secondary | ICD-10-CM | POA: Diagnosis present

## 2023-04-28 DIAGNOSIS — Z87891 Personal history of nicotine dependence: Secondary | ICD-10-CM

## 2023-04-28 DIAGNOSIS — Z91148 Patient's other noncompliance with medication regimen for other reason: Secondary | ICD-10-CM

## 2023-04-28 DIAGNOSIS — Z6833 Body mass index (BMI) 33.0-33.9, adult: Secondary | ICD-10-CM

## 2023-04-28 DIAGNOSIS — E1165 Type 2 diabetes mellitus with hyperglycemia: Secondary | ICD-10-CM | POA: Diagnosis not present

## 2023-04-28 DIAGNOSIS — E8729 Other acidosis: Secondary | ICD-10-CM | POA: Diagnosis not present

## 2023-04-28 DIAGNOSIS — E1169 Type 2 diabetes mellitus with other specified complication: Secondary | ICD-10-CM | POA: Diagnosis present

## 2023-04-28 DIAGNOSIS — I152 Hypertension secondary to endocrine disorders: Secondary | ICD-10-CM | POA: Diagnosis present

## 2023-04-28 DIAGNOSIS — Z833 Family history of diabetes mellitus: Secondary | ICD-10-CM

## 2023-04-28 DIAGNOSIS — E860 Dehydration: Secondary | ICD-10-CM | POA: Diagnosis present

## 2023-04-28 DIAGNOSIS — E872 Acidosis, unspecified: Secondary | ICD-10-CM | POA: Diagnosis present

## 2023-04-28 DIAGNOSIS — E1140 Type 2 diabetes mellitus with diabetic neuropathy, unspecified: Secondary | ICD-10-CM | POA: Diagnosis present

## 2023-04-28 DIAGNOSIS — E1110 Type 2 diabetes mellitus with ketoacidosis without coma: Secondary | ICD-10-CM | POA: Diagnosis not present

## 2023-04-28 DIAGNOSIS — E66811 Obesity, class 1: Secondary | ICD-10-CM | POA: Diagnosis present

## 2023-04-28 DIAGNOSIS — E119 Type 2 diabetes mellitus without complications: Secondary | ICD-10-CM

## 2023-04-28 DIAGNOSIS — Z1152 Encounter for screening for COVID-19: Secondary | ICD-10-CM

## 2023-04-28 DIAGNOSIS — Z8249 Family history of ischemic heart disease and other diseases of the circulatory system: Secondary | ICD-10-CM

## 2023-04-28 DIAGNOSIS — E871 Hypo-osmolality and hyponatremia: Secondary | ICD-10-CM | POA: Diagnosis present

## 2023-04-28 DIAGNOSIS — Z794 Long term (current) use of insulin: Secondary | ICD-10-CM

## 2023-04-28 DIAGNOSIS — Z79899 Other long term (current) drug therapy: Secondary | ICD-10-CM

## 2023-04-28 DIAGNOSIS — E669 Obesity, unspecified: Secondary | ICD-10-CM

## 2023-04-28 DIAGNOSIS — R112 Nausea with vomiting, unspecified: Secondary | ICD-10-CM | POA: Diagnosis present

## 2023-04-28 DIAGNOSIS — E785 Hyperlipidemia, unspecified: Secondary | ICD-10-CM | POA: Diagnosis present

## 2023-04-28 DIAGNOSIS — N179 Acute kidney failure, unspecified: Secondary | ICD-10-CM | POA: Diagnosis present

## 2023-04-28 LAB — CBC WITH DIFFERENTIAL/PLATELET
Abs Immature Granulocytes: 0.04 10*3/uL (ref 0.00–0.07)
Basophils Absolute: 0.1 10*3/uL (ref 0.0–0.1)
Basophils Relative: 1 %
Eosinophils Absolute: 0 10*3/uL (ref 0.0–0.5)
Eosinophils Relative: 0 %
HCT: 39.7 % (ref 36.0–46.0)
Hemoglobin: 12.8 g/dL (ref 12.0–15.0)
Immature Granulocytes: 1 %
Lymphocytes Relative: 13 %
Lymphs Abs: 1 10*3/uL (ref 0.7–4.0)
MCH: 27.1 pg (ref 26.0–34.0)
MCHC: 32.2 g/dL (ref 30.0–36.0)
MCV: 84.1 fL (ref 80.0–100.0)
Monocytes Absolute: 0.7 10*3/uL (ref 0.1–1.0)
Monocytes Relative: 8 %
Neutro Abs: 6.1 10*3/uL (ref 1.7–7.7)
Neutrophils Relative %: 77 %
Platelets: 419 10*3/uL — ABNORMAL HIGH (ref 150–400)
RBC: 4.72 MIL/uL (ref 3.87–5.11)
RDW: 14.3 % (ref 11.5–15.5)
WBC: 8 10*3/uL (ref 4.0–10.5)
nRBC: 0 % (ref 0.0–0.2)

## 2023-04-28 LAB — URINALYSIS, ROUTINE W REFLEX MICROSCOPIC
Bacteria, UA: NONE SEEN
Bilirubin Urine: NEGATIVE
Glucose, UA: >=500 mg/dL — AB
Ketones, ur: 80 mg/dL — AB
Leukocytes,Ua: NEGATIVE
Nitrite: NEGATIVE
Protein, ur: 100 mg/dL — AB
Specific Gravity, Urine: 1.012 (ref 1.005–1.030)
pH: 5 (ref 5.0–8.0)

## 2023-04-28 LAB — RAPID URINE DRUG SCREEN, HOSP PERFORMED
Amphetamines: NOT DETECTED
Barbiturates: NOT DETECTED
Benzodiazepines: NOT DETECTED
Cocaine: NOT DETECTED
Opiates: NOT DETECTED
Tetrahydrocannabinol: NOT DETECTED

## 2023-04-28 LAB — BASIC METABOLIC PANEL WITH GFR
Anion gap: 25 — ABNORMAL HIGH (ref 5–15)
BUN: 20 mg/dL (ref 6–20)
CO2: 12 mmol/L — ABNORMAL LOW (ref 22–32)
Calcium: 7.3 mg/dL — ABNORMAL LOW (ref 8.9–10.3)
Chloride: 98 mmol/L (ref 98–111)
Creatinine, Ser: 1.05 mg/dL — ABNORMAL HIGH (ref 0.44–1.00)
GFR, Estimated: >60 mL/min (ref >60)
Glucose, Bld: 208 mg/dL — ABNORMAL HIGH (ref 70–99)
Potassium: 4.1 mmol/L (ref 3.5–5.1)
Sodium: 135 mmol/L (ref 135–145)

## 2023-04-28 LAB — OSMOLALITY: Osmolality: 340 mosm/kg (ref 275–295)

## 2023-04-28 LAB — ETHANOL: Alcohol, Ethyl (B): 191 mg/dL — ABNORMAL HIGH (ref <10)

## 2023-04-28 LAB — CBG MONITORING, ED: Glucose-Capillary: 233 mg/dL — ABNORMAL HIGH (ref 70–99)

## 2023-04-28 LAB — COMPREHENSIVE METABOLIC PANEL WITH GFR
ALT: 34 U/L (ref 0–44)
AST: 30 U/L (ref 15–41)
Albumin: 4.1 g/dL (ref 3.5–5.0)
Alkaline Phosphatase: 88 U/L (ref 38–126)
Anion gap: 29 — ABNORMAL HIGH (ref 5–15)
BUN: 23 mg/dL — ABNORMAL HIGH (ref 6–20)
CO2: 10 mmol/L — ABNORMAL LOW (ref 22–32)
Calcium: 8.4 mg/dL — ABNORMAL LOW (ref 8.9–10.3)
Chloride: 90 mmol/L — ABNORMAL LOW (ref 98–111)
Creatinine, Ser: 1.09 mg/dL — ABNORMAL HIGH (ref 0.44–1.00)
GFR, Estimated: >60 mL/min (ref >60)
Glucose, Bld: 253 mg/dL — ABNORMAL HIGH (ref 70–99)
Potassium: 4.6 mmol/L (ref 3.5–5.1)
Sodium: 129 mmol/L — ABNORMAL LOW (ref 135–145)
Total Bilirubin: 1.5 mg/dL — ABNORMAL HIGH (ref 0.0–1.2)
Total Protein: 8.9 g/dL — ABNORMAL HIGH (ref 6.5–8.1)

## 2023-04-28 LAB — BLOOD GAS, VENOUS
Acid-base deficit: 13.6 mmol/L — ABNORMAL HIGH (ref 0.0–2.0)
Bicarbonate: 12.6 mmol/L — ABNORMAL LOW (ref 20.0–28.0)
O2 Saturation: 64.7 %
Patient temperature: 37
pCO2, Ven: 30 mmHg — ABNORMAL LOW (ref 44–60)
pH, Ven: 7.23 — ABNORMAL LOW (ref 7.25–7.43)
pO2, Ven: 39 mmHg (ref 32–45)

## 2023-04-28 LAB — PHOSPHORUS: Phosphorus: 1.9 mg/dL — ABNORMAL LOW (ref 2.5–4.6)

## 2023-04-28 LAB — HCG, SERUM, QUALITATIVE: Preg, Serum: NEGATIVE

## 2023-04-28 LAB — MAGNESIUM: Magnesium: 1.9 mg/dL (ref 1.7–2.4)

## 2023-04-28 LAB — LIPASE, BLOOD: Lipase: 41 U/L (ref 11–51)

## 2023-04-28 LAB — RESP PANEL BY RT-PCR (RSV, FLU A&B, COVID)  RVPGX2
Influenza A by PCR: NEGATIVE
Influenza B by PCR: NEGATIVE
Resp Syncytial Virus by PCR: NEGATIVE
SARS Coronavirus 2 by RT PCR: NEGATIVE

## 2023-04-28 LAB — TROPONIN I (HIGH SENSITIVITY): Troponin I (High Sensitivity): 3 ng/L (ref <18)

## 2023-04-28 LAB — OSMOLALITY, URINE: Osmolality, Ur: 494 mosm/kg (ref 300–900)

## 2023-04-28 LAB — TSH: TSH: 0.253 u[IU]/mL — ABNORMAL LOW (ref 0.350–4.500)

## 2023-04-28 LAB — CK: Total CK: 27 U/L — ABNORMAL LOW (ref 38–234)

## 2023-04-28 LAB — CREATININE, URINE, RANDOM: Creatinine, Urine: 43 mg/dL

## 2023-04-28 LAB — LACTIC ACID, PLASMA: Lactic Acid, Venous: 3.1 mmol/L (ref 0.5–1.9)

## 2023-04-28 LAB — SODIUM, URINE, RANDOM: Sodium, Ur: 42 mmol/L

## 2023-04-28 MED ORDER — INSULIN ASPART 100 UNIT/ML IJ SOLN
0.0000 [IU] | INTRAMUSCULAR | Status: DC
  Administered 2023-04-28: 3 [IU] via SUBCUTANEOUS
  Administered 2023-04-29: 2 [IU] via SUBCUTANEOUS
  Administered 2023-04-29: 3 [IU] via SUBCUTANEOUS
  Filled 2023-04-28: qty 0.09

## 2023-04-28 MED ORDER — POTASSIUM PHOSPHATES 15 MMOLE/5ML IV SOLN
15.0000 mmol | Freq: Once | INTRAVENOUS | Status: AC
  Administered 2023-04-28: 15 mmol via INTRAVENOUS
  Filled 2023-04-28: qty 5

## 2023-04-28 MED ORDER — SODIUM CHLORIDE 0.9 % IV BOLUS
1000.0000 mL | Freq: Once | INTRAVENOUS | Status: AC
  Administered 2023-04-28: 1000 mL via INTRAVENOUS

## 2023-04-28 MED ORDER — THIAMINE HCL 100 MG/ML IJ SOLN
100.0000 mg | Freq: Every day | INTRAMUSCULAR | Status: DC
  Filled 2023-04-28: qty 2

## 2023-04-28 MED ORDER — DEXTROSE-SODIUM CHLORIDE 5-0.45 % IV SOLN: INTRAVENOUS | Status: DC

## 2023-04-28 MED ORDER — SUCRALFATE 1 GM/10ML PO SUSP
1.0000 g | Freq: Three times a day (TID) | ORAL | Status: DC
  Administered 2023-04-28 – 2023-04-30 (×5): 1 g via ORAL
  Filled 2023-04-28 (×5): qty 10

## 2023-04-28 MED ORDER — METOCLOPRAMIDE HCL 5 MG/ML IJ SOLN
5.0000 mg | Freq: Four times a day (QID) | INTRAMUSCULAR | Status: DC | PRN
  Administered 2023-04-28 – 2023-04-29 (×2): 5 mg via INTRAVENOUS
  Filled 2023-04-28 (×2): qty 2

## 2023-04-28 MED ORDER — LORAZEPAM 2 MG/ML IJ SOLN
1.0000 mg | INTRAMUSCULAR | Status: DC | PRN
  Administered 2023-04-29: 2 mg via INTRAVENOUS
  Filled 2023-04-28: qty 1

## 2023-04-28 MED ORDER — FENTANYL CITRATE PF 50 MCG/ML IJ SOSY: 25.0000 ug | PREFILLED_SYRINGE | INTRAMUSCULAR | Status: DC | PRN

## 2023-04-28 MED ORDER — FOLIC ACID 1 MG PO TABS
1.0000 mg | ORAL_TABLET | Freq: Every day | ORAL | Status: DC
  Administered 2023-04-28 – 2023-04-30 (×3): 1 mg via ORAL
  Filled 2023-04-28 (×3): qty 1

## 2023-04-28 MED ORDER — METOPROLOL TARTRATE 12.5 MG HALF TABLET
12.5000 mg | ORAL_TABLET | Freq: Two times a day (BID) | ORAL | Status: DC
  Administered 2023-04-28 – 2023-04-30 (×4): 12.5 mg via ORAL
  Filled 2023-04-28 (×4): qty 1

## 2023-04-28 MED ORDER — ADULT MULTIVITAMIN W/MINERALS CH
1.0000 | ORAL_TABLET | Freq: Every day | ORAL | Status: DC
  Administered 2023-04-28 – 2023-04-30 (×3): 1 via ORAL
  Filled 2023-04-28 (×3): qty 1

## 2023-04-28 MED ORDER — ACETAMINOPHEN 500 MG PO TABS
1000.0000 mg | ORAL_TABLET | Freq: Once | ORAL | Status: AC
  Administered 2023-04-28: 1000 mg via ORAL
  Filled 2023-04-28: qty 2

## 2023-04-28 MED ORDER — THIAMINE MONONITRATE 100 MG PO TABS
100.0000 mg | ORAL_TABLET | Freq: Every day | ORAL | Status: DC
  Administered 2023-04-28 – 2023-04-30 (×3): 100 mg via ORAL
  Filled 2023-04-28 (×3): qty 1

## 2023-04-28 MED ORDER — LORAZEPAM 2 MG/ML IJ SOLN
1.0000 mg | Freq: Once | INTRAMUSCULAR | Status: AC
  Administered 2023-04-28: 1 mg via INTRAVENOUS
  Filled 2023-04-28: qty 1

## 2023-04-28 MED ORDER — ONDANSETRON HCL 4 MG/2ML IJ SOLN: 4.0000 mg | Freq: Once | INTRAMUSCULAR | Status: DC

## 2023-04-28 MED ORDER — LORAZEPAM 1 MG PO TABS: 1.0000 mg | ORAL_TABLET | ORAL | Status: DC | PRN

## 2023-04-28 MED ORDER — ALUM & MAG HYDROXIDE-SIMETH 200-200-20 MG/5ML PO SUSP
30.0000 mL | Freq: Once | ORAL | Status: AC
  Administered 2023-04-28: 30 mL via ORAL
  Filled 2023-04-28: qty 30

## 2023-04-28 MED ORDER — THIAMINE HCL 100 MG/ML IJ SOLN: 100.0000 mg | Freq: Every day | INTRAMUSCULAR | Status: DC

## 2023-04-28 NOTE — Assessment & Plan Note (Signed)
-   will replace electrolytes and repeat  check Mg, phos and Ca level and replace as needed Monitor on telemetry   Lab Results  Component Value Date   K 4.1 04/28/2023     Lab Results  Component Value Date   CREATININE 1.05 (H) 04/28/2023   Lab Results  Component Value Date   MG 1.9 04/28/2023   Lab Results  Component Value Date   CALCIUM 7.3 (L) 04/28/2023   PHOS 1.9 (L) 04/28/2023

## 2023-04-28 NOTE — Assessment & Plan Note (Signed)
 In the setting of alcohol abuse mild dehydration.  Obtain electrolytes carefully monitored rehydrate

## 2023-04-28 NOTE — Assessment & Plan Note (Signed)
 Order CIWA protocol Administer thiamine IV Counseled regarding cessation

## 2023-04-28 NOTE — ED Provider Notes (Signed)
 [place] EMERGENCY DEPARTMENT AT [facility] Provider Note   CSN: [id number] Arrival date & time: 04/28/23  1416     History  Chief Complaint  Patient presents with   Generalized Body Aches   Emesis    Yolanda Flores is a 38 y.o. female.  Patient presents to the ED via EMS with complaint of nausea, vomiting, body aches x 1 week. Testing for COVID, influenza, RSV is negative. She endorses heavy alcohol consumption over the last several days. Last alcohol intake was this morning. Other than alcohol, she has not been able to tolerate solids and other fluids. She denies abdominal pain. She endorses getting short of breath when ambulating from her bed to the bathroom. Prior admissions for alcoholic ketoacidosis.    Emesis Severity:  Severe Duration:  1 week Timing:  Intermittent Quality:  Stomach contents Progression:  Unchanged Recent urination:  Decreased Associated symptoms: chills, headaches and myalgias   Associated symptoms: no abdominal pain and no fever   Risk factors: alcohol use and diabetes        Home Medications Prior to Admission medications   Medication Sig Start Date End Date Taking? Authorizing Provider  aspirin -acetaminophen -caffeine  (EXCEDRIN  MIGRAINE) 250-250-65 MG tablet Take 2 tablets by mouth every 6 (six) hours as needed for headache.    [provider]  atorvastatin  (LIPITOR) 40 MG tablet 1 tab PO Q M/W/Frid 03/31/23   Lawrance Presume, MD  Continuous Glucose Sensor (FREESTYLE LIBRE 3 SENSOR) MISC Change Q 2 weeks 10/21/22   Lawrance Presume, MD  diclofenac  (VOLTAREN ) 75 MG EC tablet Take 1 tablet (75 mg total) by mouth 2 (two) times daily. 04/08/23   Brandt Cake, DPM  folic acid  (FOLVITE ) 1 MG tablet Take 1 tablet (1 mg total) by mouth daily. Patient not taking: Reported on 03/31/2023 01/20/23   Krishnan, Gokul, MD  Insulin  Glargine w/ Trans Port (BASAGLAR  TEMPO PEN) 100 UNIT/ML SOPN Inject 22 Units into the skin daily. 03/31/23    Lawrance Presume, MD  insulin  lispro (HUMALOG  KWIKPEN) 100 UNIT/ML KwikPen 3 units subcut inj with the 2 largest meals of the day. Patient taking differently: Inject 3 Units into the skin daily. 10/28/22   Lawrance Presume, MD  lisinopril  (ZESTRIL ) 10 MG tablet Take 1 tablet (10 mg total) by mouth daily. 03/31/23   Lawrance Presume, MD  magnesium  oxide (MAG-OX) 400 (240 Mg) MG tablet Take 1 tablet (400 mg total) by mouth 2 (two) times daily. Patient not taking: Reported on 03/31/2023 01/20/23   Krishnan, Gokul, MD  Multiple Vitamin (MULTIVITAMIN WITH MINERALS) TABS tablet Take 1 tablet by mouth daily. 01/18/19   Akula, Vijaya, MD  omeprazole  (PRILOSEC ) 20 MG capsule Take 1 capsule (20 mg total) by mouth daily. 03/31/23   Lawrance Presume, MD  ondansetron  (ZOFRAN -ODT) 4 MG disintegrating tablet Take 1 tablet (4 mg total) by mouth every 8 (eight) hours as needed. Patient not taking: Reported on 03/31/2023 01/08/23   Lucina Sabal, PA-C  pregabalin  (LYRICA ) 50 MG capsule Take 1 capsule (50 mg total) by mouth 2 (two) times daily. 03/31/23   Lawrance Presume, MD  thiamine  (VITAMIN B1) 100 MG tablet Take 1 tablet (100 mg total) by mouth daily. 01/20/23   Krishnan, Gokul, MD      Allergies    Ibuprofen     Review of Systems   Review of Systems  Constitutional:  Positive for chills. Negative for fever.  Gastrointestinal:  Positive for vomiting. Negative for  abdominal pain.  Musculoskeletal:  Positive for myalgias.  Neurological:  Positive for headaches.  All other systems reviewed and are negative.   Physical Exam Updated Vital Signs BP (!) 140/111 (BP Location: Left Arm)   Pulse (!) 157   Temp 98.6 F (37 C) (Oral)   Resp 17   Ht 5\' 7"  (1.702 m)   Wt 97.1 kg   LMP 04/05/2023 (Exact Date)   SpO2 100%   BMI 33.53 kg/m  Physical Exam  ED Results / Procedures / Treatments   Labs (all labs ordered are listed, but only abnormal results are displayed) Labs Reviewed  COMPREHENSIVE  METABOLIC PANEL WITH GFR - Abnormal; Notable for the following components:      Result Value   Sodium 129 (*)    Chloride 90 (*)    CO2 10 (*)    Glucose, Bld 253 (*)    BUN 23 (*)    Creatinine, Ser 1.09 (*)    Calcium  8.4 (*)    Total Protein 8.9 (*)    Total Bilirubin 1.5 (*)    Anion gap 29 (*)    All other components within normal limits  CBC WITH DIFFERENTIAL/PLATELET - Abnormal; Notable for the following components:   Platelets 419 (*)    All other components within normal limits  URINALYSIS, ROUTINE W REFLEX MICROSCOPIC - Abnormal; Notable for the following components:   Color, Urine STRAW (*)    Glucose, UA >=500 (*)    Hgb urine dipstick MODERATE (*)    Ketones, ur 80 (*)    Protein, ur 100 (*)    All other components within normal limits  ETHANOL - Abnormal; Notable for the following components:   Alcohol, Ethyl (B) 191 (*)    All other components within normal limits  RESP PANEL BY RT-PCR (RSV, FLU A&B, COVID)  RVPGX2  LIPASE, BLOOD  HCG, SERUM, QUALITATIVE    EKG None  Radiology DG Chest 1 View Result Date: 04/28/2023 CLINICAL DATA:  shortness of breath EXAM: CHEST  1 VIEW COMPARISON:  01/17/2023. FINDINGS: Cardiac silhouette is unremarkable. No pneumothorax or pleural effusion. The lungs are clear. The visualized skeletal structures are unremarkable. IMPRESSION: No acute cardiopulmonary process. Electronically Signed   By: Sydell Eva M.D.   On: 04/28/2023 19:38    Procedures Procedures    Medications Ordered in ED Medications - No data to display  ED Course/ Medical Decision Making/ A&P                                 Medical Decision Making Amount and/or Complexity of Data Reviewed Labs: ordered. Radiology: ordered.  Risk OTC drugs.   Presentation today is almost a carbon copy of a previous admission for alcoholic ketoacidosis. She is a diabetic and has not been taking her medication over the last week. Urinalysis shows glucosuria with  ketones and protein. She started her period this week accounting for the hemoglobinuria. She does not have any urinary symptoms. Anion gap of 29, with mild acidosis noted on VBG. Corrected sodium of 131.  She has received IV fluids in the ED.   Will request admission to medicine.  Discussed with hospitalist (Dutova)-will admit.        Final Clinical Impression(s) / ED Diagnoses Final diagnoses:  Alcoholic ketoacidosis    Rx / DC Orders ED Discharge Orders     None         Felipe Horton,  Myrtie Atkinson, NP 04/28/23 2240    Owen Blowers P, DO 05/03/23 1410

## 2023-04-28 NOTE — Assessment & Plan Note (Signed)
 Likely alcoholic ketoacidosis.  Check lactic acid The patient has had similar presentations in the past Start D5 half-normal fluids Monitor electrolytes every 4 hours

## 2023-04-28 NOTE — Assessment & Plan Note (Signed)
 Obtain trop  Echo in AM

## 2023-04-28 NOTE — Assessment & Plan Note (Signed)
 Order sliding scale patient has not been compliant with her insulin.  In the past have had episodes of hypoglycemia.  May need to restart her home insulin at lower dose of 10 units tonight and monitor

## 2023-04-28 NOTE — ED Notes (Signed)
 Patient requesting pain and nausea medications at this time. Provider notified.

## 2023-04-28 NOTE — Assessment & Plan Note (Signed)
 Contributing to Anion gap acidosis will rehydre and follow  Lactic acidosis in the setting of dehydration

## 2023-04-28 NOTE — Assessment & Plan Note (Signed)
 Allow permissive hypertension for tonight given mild AKI hold lisinopril

## 2023-04-28 NOTE — Assessment & Plan Note (Signed)
 In the setting of dehydration will rehydrate and follow fluid status

## 2023-04-28 NOTE — Subjective & Objective (Signed)
 Resents with nausea vomiting body aches for the past 1 week has been still drinking alcohol last drink was this morning History of alcohol abuse, hypertension, diabetes and history of alcohol pancreatitis she is post to be on Basaglar 22 units daily as well as NovoLog also recently restarted her metformin She has chronic neuropathic foot pain and she takes Lyrica for this

## 2023-04-28 NOTE — H&P (Addendum)
 Yolanda Flores:[id number] DOB: 1985-02-07 DOA: 04/28/2023     PCP: Marcine Matar, MD      Patient arrived to ER on 04/28/23 at 1416 Referred by Attending Franne Forts, DO   Patient coming from:    home Lives  With family     Chief Complaint:   Chief Complaint  Patient presents with   Generalized Body Aches   Emesis    HPI: Yolanda Flores is a 38 y.o. female with medical history significant of HLD HTN DM2 neuropathy alcohol abuse tobacco abuse alcoholic pancreatitis    Presented with nausea vomiting fatigue for 1 week Sorethroat  Resents with nausea vomiting body aches for the past 1 week has been still drinking alcohol last drink was this morning History of alcohol abuse, hypertension, diabetes and history of alcohol pancreatitis she is post to be on Basaglar 22 units daily as well as NovoLog also recently restarted her metformin She has chronic neuropathic foot pain and she takes Lyrica for this     Has been drinking not using her insulin Patient have had in the past recurrent hypoglycemia in the setting of insulin dependent diabetes  Also has history of alcoholic ketosis in the setting of binge drinking as well as history of alcoholic hepatitis  Reports burning in her chest from vomiting repo   significant ETOH intake last drink was today Does not smoke  Denies THC  Lab Results  Component Value Date   SARSCOV2NAA NEGATIVE 04/28/2023   SARSCOV2NAA NEGATIVE 01/17/2023   SARSCOV2NAA NEGATIVE 12/12/2022   SARSCOV2NAA NEGATIVE 02/28/2022   Regarding pertinent Chronic problems: AST  Hyperlipidemia - on statins Lipitor (atorvastatin)  Lipid Panel     Component Value Date/Time   CHOL 103 07/16/2022 1216   TRIG 81 07/16/2022 1216   HDL 44 07/16/2022 1216   CHOLHDL 2.3 07/16/2022 1216   CHOLHDL 4.4 03/01/2022 0133   VLDL UNABLE TO CALCULATE IF TRIGLYCERIDE OVER 400 mg/dL 81/19/1478 2956   LDLCALC 43 07/16/2022 1216   LDLDIRECT 189 (H) 03/01/2022  0133   LABVLDL 16 07/16/2022 1216    HTN on Ramipril      DM 2 -  Lab Results  Component Value Date   HGBA1C 7.9 (H) 01/19/2023    on insulin, poor compliance    Chronic anemia - baseline hg Hemoglobin & Hematocrit  Recent Labs    01/19/23 0331 01/20/23 0332 04/28/23 1442  HGB 11.0* 10.8* 12.8   Iron/TIBC/Ferritin/ %Sat    Component Value Date/Time   IRON 27 (L) 01/07/2019 0943   TIBC 233 (L) 01/07/2019 0943   FERRITIN 593 (H) 01/07/2019 0943   IRONPCTSAT 12 01/07/2019 0943    While in ER:    AG 29 Alcohol level 191 VBG   7.23 Low  Acid-base deficit 13.6 High  mmol/L   pCO2, Ven 30 Low  mmHg O2 Saturation 64.7 %  pO2, Ven 39 mmHg        Lab Orders         Resp panel by RT-PCR (RSV, Flu A&B, Covid) Anterior Nasal Swab         Comprehensive metabolic panel         Lipase, blood         CBC with Diff         Urinalysis, Routine w reflex microscopic -Urine, Clean Catch         hCG, serum, qualitative         Ethanol  Blood gas, venous (at [facility] and [facility])      CXR -  NON acute    Following Medications were ordered in ER: Medications  alum & mag hydroxide-simeth (MAALOX/MYLANTA) 200-200-20 MG/5ML suspension 30 mL (30 mLs Oral Given 04/28/23 1807)  acetaminophen (TYLENOL) tablet 1,000 mg (1,000 mg Oral Given 04/28/23 1807)  sodium chloride 0.9 % bolus 1,000 mL (1,000 mLs Intravenous New Bag/Given 04/28/23 1852)    ___    ED Triage Vitals  Encounter Vitals Group     BP 04/28/23 1425 (!) 140/111     Systolic BP Percentile --      Diastolic BP Percentile --      Pulse Rate 04/28/23 1425 (!) 157     Resp 04/28/23 1425 17     Temp 04/28/23 1425 98.6 F (37 C)     Temp Source 04/28/23 1425 Oral     SpO2 04/28/23 1425 100 %     Weight 04/28/23 1429 214 lb 1.1 oz (97.1 kg)     Height 04/28/23 1429 5\' 7"  (1.702 m)     Head Circumference --      Peak Flow --      Pain Score 04/28/23 1428 10     Pain Loc --      Pain Education --      Exclude from Growth  Chart --   JXBJ(47)@     _________________________________________ Significant initial  Findings: Abnormal Labs Reviewed  COMPREHENSIVE METABOLIC PANEL WITH GFR - Abnormal; Notable for the following components:      Result Value   Sodium 129 (*)    Chloride 90 (*)    CO2 10 (*)    Glucose, Bld 253 (*)    BUN 23 (*)    Creatinine, Ser 1.09 (*)    Calcium 8.4 (*)    Total Protein 8.9 (*)    Total Bilirubin 1.5 (*)    Anion gap 29 (*)    All other components within normal limits  CBC WITH DIFFERENTIAL/PLATELET - Abnormal; Notable for the following components:   Platelets 419 (*)    All other components within normal limits  URINALYSIS, ROUTINE W REFLEX MICROSCOPIC - Abnormal; Notable for the following components:   Color, Urine STRAW (*)    Glucose, UA >=500 (*)    Hgb urine dipstick MODERATE (*)    Ketones, ur 80 (*)    Protein, ur 100 (*)    All other components within normal limits  ETHANOL - Abnormal; Notable for the following components:   Alcohol, Ethyl (B) 191 (*)    All other components within normal limits  BLOOD GAS, VENOUS - Abnormal; Notable for the following components:   pH, Ven 7.23 (*)    pCO2, Ven 30 (*)    Bicarbonate 12.6 (*)    Acid-base deficit 13.6 (*)    All other components within normal limits      _________________________ Troponin  ordered Cardiac Panel (last 3 results) No results for input(s): "CKTOTAL", "CKMB", "TROPONINIHS", "RELINDX" in the last 72 hours.   ECG: Ordered Personally reviewed and interpreted by me showing: HR : 134 Rhythm:Sinus tachycardia Left anterior fascicular block Borderline repolarization abnormality ST elevation, consider inferior injury QTC 454    The recent clinical data is shown below. Vitals:   04/28/23 1700 04/28/23 1730 04/28/23 1836 04/28/23 1845  BP: 132/88 (!) 124/97    Pulse:  (!) 117  (!) 118  Resp: (!) 21 (!) 23  (!) 27  Temp:  98.7 F (37.1 C)   TempSrc:   Oral   SpO2:  100%  100%  Weight:       Height:        WBC     Component Value Date/Time   WBC 8.0 04/28/2023 1442   LYMPHSABS 1.0 04/28/2023 1442   MONOABS 0.7 04/28/2023 1442   EOSABS 0.0 04/28/2023 1442   BASOSABS 0.1 04/28/2023 1442         UA   no evidence of UTI      Urine analysis:    Component Value Date/Time   COLORURINE STRAW (A) 04/28/2023 1610   APPEARANCEUR CLEAR 04/28/2023 1610   LABSPEC 1.012 04/28/2023 1610   PHURINE 5.0 04/28/2023 1610   GLUCOSEU >=500 (A) 04/28/2023 1610   HGBUR MODERATE (A) 04/28/2023 1610   BILIRUBINUR NEGATIVE 04/28/2023 1610   KETONESUR 80 (A) 04/28/2023 1610   PROTEINUR 100 (A) 04/28/2023 1610   UROBILINOGEN 2.0 (H) 12/31/2009 1757   NITRITE NEGATIVE 04/28/2023 1610   LEUKOCYTESUR NEGATIVE 04/28/2023 1610    Results for orders placed or performed during the hospital encounter of 04/28/23  Resp panel by RT-PCR (RSV, Flu A&B, Covid) Anterior Nasal Swab     Status: None   Collection Time: 04/28/23  2:42 PM   Specimen: Anterior Nasal Swab  Result Value Ref Range Status   SARS Coronavirus 2 by RT PCR NEGATIVE NEGATIVE Final         Influenza A by PCR NEGATIVE NEGATIVE Final   Influenza B by PCR NEGATIVE NEGATIVE Final         Resp Syncytial Virus by PCR NEGATIVE NEGATIVE Final           ________________________________________________________________   Venous  Blood Gas result:  pH  7.23 Low  Acid-base deficit 13.6 High  mmol/L   pCO2, Ven 30 Low  mmHg O2 Saturation 64.7 %  pO2, Ven 39 mmHg     ABG    Component Value Date/Time   PHART 7.28 (L) 03/01/2022 1135   PCO2ART 25 (L) 03/01/2022 1135   PO2ART 106 03/01/2022 1135   HCO3 12.6 (L) 04/28/2023 1811   TCO2 18 (L) 02/28/2022 2039   ACIDBASEDEF 13.6 (H) 04/28/2023 1811   O2SAT 64.7 04/28/2023 1811    __________________________________________________________ Recent Labs  Lab 04/28/23 1442  NA 129*  K 4.6  CO2 10*  GLUCOSE 253*  BUN 23*  CREATININE 1.09*  CALCIUM 8.4*    Cr  ,  Up from  baseline see below Lab Results  Component Value Date   CREATININE 1.09 (H) 04/28/2023   CREATININE 0.94 03/31/2023   CREATININE 0.57 01/20/2023    Recent Labs  Lab 04/28/23 1442  AST 30  ALT 34  ALKPHOS 88  BILITOT 1.5*  PROT 8.9*  ALBUMIN 4.1   Lab Results  Component Value Date   CALCIUM 8.4 (L) 04/28/2023   PHOS 1.7 (L) 03/02/2022       Plt: Lab Results  Component Value Date   PLT 419 (H) 04/28/2023       Recent Labs  Lab 04/28/23 1442  WBC 8.0  NEUTROABS 6.1  HGB 12.8  HCT 39.7  MCV 84.1  PLT 419*    HG/HCT  Up from baseline see below    Component Value Date/Time   HGB 12.8 04/28/2023 1442   HCT 39.7 04/28/2023 1442   MCV 84.1 04/28/2023 1442     Recent Labs  Lab 04/28/23 1442  LIPASE 41     _______________________________________________  Hospitalist was called for admission for   Alcoholic ketoacidosis, anion gap metabolic acidosis    The following Work up has been ordered so far:  Orders Placed This Encounter  Procedures   Resp panel by RT-PCR (RSV, Flu A&B, Covid) Anterior Nasal Swab   DG Chest 1 View   Comprehensive metabolic panel   Lipase, blood   CBC with Diff   Urinalysis, Routine w reflex microscopic -Urine, Clean Catch   hCG, serum, qualitative   Ethanol   Blood gas, venous (at [facility] and [facility])   Diet NPO time specified   Initiate Carrier Fluid Protocol   Consult to hospitalist   ED EKG   EKG 12-Lead   Insert peripheral IV     OTHER Significant initial  Findings:  labs showing:     DM  labs:  HbA1C: Recent Labs    07/16/22 1035 10/21/22 0953 01/19/23 0331  HGBA1C 8.2* 11.3* 7.9*      CBG (last 3)  No results for input(s): "GLUCAP" in the last 72 hours.        Cultures:    Component Value Date/Time   SDES  01/04/2022 0820    BLOOD LEFT ARM Performed at [facility], [address], [place], [place] [address]    SPECREQUEST  01/04/2022 0820    BOTTLES DRAWN AEROBIC AND ANAEROBIC Blood Culture adequate  volume Performed at [facility], [address] [place], [place] [address]    CULT  01/04/2022 0820    NO GROWTH 5 DAYS Performed at [facility], [address] [place], [place] [address]    REPTSTATUS 01/09/2022 FINAL 01/04/2022 0820     Radiological Exams on Admission: DG Chest 1 View Result Date: 04/28/2023 CLINICAL DATA:  shortness of breath EXAM: CHEST  1 VIEW COMPARISON:  01/17/2023. FINDINGS: Cardiac silhouette is unremarkable. No pneumothorax or pleural effusion. The lungs are clear. The visualized skeletal structures are unremarkable. IMPRESSION: No acute cardiopulmonary process. Electronically Signed   By: Sydell Eva M.D.   On: 04/28/2023 19:38   _______________________________________________________________________________________________________ Latest  Blood pressure (!) 124/97, pulse (!) 118, temperature 98.7 F (37.1 C), temperature source Oral, resp. rate (!) 27, height 5\' 7"  (1.702 m), weight 97.1 kg, last menstrual period 04/05/2023, SpO2 100%.   Vitals  labs and radiology finding personally reviewed  Review of Systems:    Pertinent positives include:   fatigue,  indigestion, abdominal pain, nausea, vomiting,  Constitutional:  No weight loss, night sweats, Fevers, chills, weight loss  HEENT:  No headaches, Difficulty swallowing,Tooth/dental problems,Sore throat,  No sneezing, itching, ear ache, nasal congestion, post nasal drip,  Cardio-vascular:  No chest pain, Orthopnea, PND, anasarca, dizziness, palpitations.no Bilateral lower extremity swelling  GI:  No heartburn,diarrhea, change in bowel habits, loss of appetite, melena, blood in stool, hematemesis Resp:  no shortness of breath at rest. No dyspnea on exertion, No excess mucus, no productive cough, No non-productive cough, No coughing up of blood.No change in color of mucus.No wheezing. Skin:  no rash or lesions. No jaundice GU:  no dysuria, change in color of urine, no  urgency or frequency. No straining to urinate.  No flank pain.  Musculoskeletal:  No joint pain or no joint swelling. No decreased range of motion. No back pain.  Psych:  No change in mood or affect. No depression or anxiety. No memory loss.  Neuro: no localizing neurological complaints, no tingling, no weakness, no double vision, no gait abnormality, no slurred speech,  no confusion  All systems reviewed and apart from HOPI all are negative _______________________________________________________________________________________________ Past Medical History:   Past Medical History:  Diagnosis Date   AKI (acute kidney injury) (HCC)    Diabetes mellitus (HCC)    HLD (hyperlipidemia)    Hypertension    Pancreatitis      Past Surgical History:  Procedure Laterality Date   FOOT SURGERY      Social History:  Ambulatory   independently     reports that she quit smoking about 4 years ago. Her smoking use included cigarettes. She has never used smokeless tobacco. She reports that she does not currently use alcohol. She reports that she does not use drugs.    Family History:   Family History  Problem Relation Age of Onset   Hypertension Mother    Diabetes Mother    Hypertension Father    Hypertension Brother    ______________________________________________________________________________________________ Allergies: Allergies  Allergen Reactions   Ibuprofen Nausea And Vomiting     Prior to Admission medications   Medication Sig Start Date End Date Taking? Authorizing Provider  aspirin-acetaminophen-caffeine (EXCEDRIN MIGRAINE) [phone] MG tablet Take 2 tablets by mouth every 6 (six) hours as needed for headache.    [provider]  atorvastatin (LIPITOR) 40 MG tablet 1 tab PO Q M/W/Frid 03/31/23   Marcine Matar, MD  Continuous Glucose Sensor (FREESTYLE LIBRE 3 SENSOR) MISC Change Q 2 weeks 10/21/22   Marcine Matar, MD  diclofenac (VOLTAREN) 75 MG EC tablet  Take 1 tablet (75 mg total) by mouth 2 (two) times daily. 04/08/23   Lenn Sink, DPM  folic acid (FOLVITE) 1 MG tablet Take 1 tablet (1 mg total) by mouth daily. Patient not taking: Reported on 03/31/2023 01/20/23   Osvaldo Shipper, MD  Insulin Glargine w/ Trans Port (BASAGLAR TEMPO PEN) 100 UNIT/ML SOPN Inject 22 Units into the skin daily. 03/31/23   Marcine Matar, MD  insulin lispro (HUMALOG KWIKPEN) 100 UNIT/ML KwikPen 3 units subcut inj with the 2 largest meals of the day. Patient taking differently: Inject 3 Units into the skin daily. 10/28/22   Marcine Matar, MD  lisinopril (ZESTRIL) 10 MG tablet Take 1 tablet (10 mg total) by mouth daily. 03/31/23   Marcine Matar, MD  magnesium oxide (MAG-OX) 400 (240 Mg) MG tablet Take 1 tablet (400 mg total) by mouth 2 (two) times daily. Patient not taking: Reported on 03/31/2023 01/20/23   Osvaldo Shipper, MD  Multiple Vitamin (MULTIVITAMIN WITH MINERALS) TABS tablet Take 1 tablet by mouth daily. 01/18/19   Kathlen Mody, MD  omeprazole (PRILOSEC) 20 MG capsule Take 1 capsule (20 mg total) by mouth daily. 03/31/23   Marcine Matar, MD  ondansetron (ZOFRAN-ODT) 4 MG disintegrating tablet Take 1 tablet (4 mg total) by mouth every 8 (eight) hours as needed. Patient not taking: Reported on 03/31/2023 01/08/23   Marita Kansas, PA-C  pregabalin (LYRICA) 50 MG capsule Take 1 capsule (50 mg total) by mouth 2 (two) times daily. 03/31/23   Marcine Matar, MD  thiamine (VITAMIN B1) 100 MG tablet Take 1 tablet (100 mg total) by mouth daily. 01/20/23   Osvaldo Shipper, MD    ___________________________________________________________________________________________________ Physical Exam:    04/28/2023    6:45 PM 04/28/2023    5:30 PM 04/28/2023    5:00 PM  Vitals with BMI  Systolic  124 132  Diastolic  97 88  Pulse 118 117      1. General:  in No  Acute distress    Chronically ill   -appearing 2. Psychological: Alert and   Oriented 3. Head/ENT:   Dry Mucous Membranes                          Head Non traumatic, neck supple                          Poor Dentition 4. SKIN: decreased Skin turgor,  Skin clean Dry and intact no rash    5. Heart: rapid Regular rate and rhythm no  Murmur, no Rub or gallop 6. Lungs:  y, no wheezes or crackles   7. Abdomen: Soft,  non-tender, Non distended   obese  bowel sounds present 8. Lower extremities: no clubbing, cyanosis, no  edema 9. Neurologically Grossly intact, moving all 4 extremities equally tremulous 10. MSK: Normal range of motion    Chart has been reviewed  ______________________________________________________________________________________________  Assessment/Plan  38 y.o. female with medical history significant of HLD HTN DM2 neuropathy alcohol abuse tobacco abuse alcoholic pancreatitis  Admitted for  Alcoholic ketoacidosis, anion gap metabolic acidosis    Present on Admission:  Alcoholic ketoacidosis  Abnormal ECG  AKI (acute kidney injury) (HCC)  Type 2 diabetes mellitus with obesity (HCC)  Alcohol abuse  Hypertension associated with diabetes (HCC)  High anion gap metabolic acidosis  Hyponatremia  Hypophosphatemia  Lactic acidosis  Hyperthyroidism     Abnormal ECG Obtain trop  Echo in AM    AKI (acute kidney injury) (HCC) In the setting of dehydration will rehydrate and follow fluid status  Type 2 diabetes mellitus with obesity (HCC) Order sliding scale patient has not been compliant with her insulin.  In the past have had episodes of hypoglycemia.  May need to restart her home insulin at lower dose of 10 units tonight and monitor  Alcohol abuse Order CIWA protocol Administer thiamine IV Counseled regarding cessation  Hypertension associated with diabetes (HCC) Allow permissive hypertension for tonight given mild AKI hold lisinopril  High anion gap metabolic acidosis Likely alcoholic ketoacidosis.  Check lactic acid The patient has had similar  presentations in the past Start D5 half-normal fluids Monitor electrolytes every 4 hours  Hyponatremia In the setting of alcohol abuse mild dehydration.  Obtain electrolytes carefully monitored rehydrate    Hypophosphatemia - will replace electrolytes and repeat  check Mg, phos and Ca level and replace as needed Monitor on telemetry   Lab Results  Component Value Date   K 4.1 04/28/2023     Lab Results  Component Value Date   CREATININE 1.05 (H) 04/28/2023   Lab Results  Component Value Date   MG 1.9 04/28/2023   Lab Results  Component Value Date   CALCIUM 7.3 (L) 04/28/2023   PHOS 1.9 (L) 04/28/2023     Lactic acidosis Contributing to Anion gap acidosis will rehydre and follow  Lactic acidosis in the setting of dehydration   Hyperthyroidism Persistently low TSH will get T3 T4 If elevated will need further work up  Start on low dose metoprolol given tachycardia   Other plan as per orders.  DVT prophylaxis:  SCD     Code Status:    Code Status: Prior FULL CODE  as per patient   I had personally discussed CODE STATUS with patient   ACP   none    Family Communication:   Family not at  Bedside  Diet  Diet Orders (From admission, onward)     Start     Ordered   04/28/23 1427  Diet NPO time specified  Diet effective now        04/28/23 1426            Disposition Plan:         To home once workup is complete and patient is stable   Following barriers for discharge:                                                      Electrolytes corrected                                                           Pain controlled with PO medications                                  Consult Orders  (From admission, onward)           Start     Ordered   04/28/23 1928  Consult to hospitalist  Once       Provider:  (Not yet assigned)  Question Answer Comment  Place call to: [facility]   Reason for Consult Admit      04/28/23 1928                                Transition of care consulted                     Admission status:  ED Disposition     ED Disposition  Admit   Condition  --   Comment  Hospital Area: [facility] [[id number]]  Level of Care: Stepdown [14]  Admit to SDU based on following criteria: Hemodynamic compromise or significant risk of instability:  Patient requiring short term acute titration and management of vasoactive drips, and invasive monitoring (i.e., CVP and Arterial line).  Admit to SDU based on following criteria: Severe physiological/psychological symptoms:  Any diagnosis requiring assessment & intervention at least every 4 hours on an ongoing basis to obtain desired patient outcomes including stability and rehabilitation  May place patient in observation at [facility] or [facility] if equivalent level of care is available:: No  Covid Evaluation: Confirmed COVID Negative  Diagnosis: Alcoholic ketoacidosis [161096]  Admitting Physician: Therisa Doyne [[id number]]  Attending Physician: Therisa Doyne [[id number]]          Obs      Level of care    stepdown      Lab Results  Component Value Date   SARSCOV2NAA NEGATIVE 04/28/2023     Gift Rueckert 04/28/2023, 10:33 PM    [facility]     after 2 AM please page floor coverage PA If 7AM-7PM, please contact the day team taking care of the patient using [email or web address]

## 2023-04-28 NOTE — ED Triage Notes (Addendum)
 Pt BIB EMS from home with reports of n/v body aches x 1 week ago. 18 g left ac. 500 ml fluids given and 4 mg zofran. Pt drank alcohol this morning.

## 2023-04-28 NOTE — Assessment & Plan Note (Addendum)
 Persistently low TSH will get T3 T4 If elevated will need further work up  Start on low dose metoprolol given tachycardia

## 2023-04-29 DIAGNOSIS — E871 Hypo-osmolality and hyponatremia: Secondary | ICD-10-CM | POA: Diagnosis present

## 2023-04-29 DIAGNOSIS — E860 Dehydration: Secondary | ICD-10-CM | POA: Diagnosis present

## 2023-04-29 DIAGNOSIS — E1140 Type 2 diabetes mellitus with diabetic neuropathy, unspecified: Secondary | ICD-10-CM | POA: Diagnosis present

## 2023-04-29 DIAGNOSIS — Y906 Blood alcohol level of 120-199 mg/100 ml: Secondary | ICD-10-CM | POA: Diagnosis present

## 2023-04-29 DIAGNOSIS — Z91148 Patient's other noncompliance with medication regimen for other reason: Secondary | ICD-10-CM | POA: Diagnosis not present

## 2023-04-29 DIAGNOSIS — Z8249 Family history of ischemic heart disease and other diseases of the circulatory system: Secondary | ICD-10-CM | POA: Diagnosis not present

## 2023-04-29 DIAGNOSIS — E1110 Type 2 diabetes mellitus with ketoacidosis without coma: Secondary | ICD-10-CM | POA: Diagnosis present

## 2023-04-29 DIAGNOSIS — R9431 Abnormal electrocardiogram [ECG] [EKG]: Secondary | ICD-10-CM | POA: Diagnosis not present

## 2023-04-29 DIAGNOSIS — E66811 Obesity, class 1: Secondary | ICD-10-CM | POA: Diagnosis present

## 2023-04-29 DIAGNOSIS — Z794 Long term (current) use of insulin: Secondary | ICD-10-CM | POA: Diagnosis not present

## 2023-04-29 DIAGNOSIS — N179 Acute kidney failure, unspecified: Secondary | ICD-10-CM | POA: Diagnosis present

## 2023-04-29 DIAGNOSIS — E785 Hyperlipidemia, unspecified: Secondary | ICD-10-CM | POA: Diagnosis present

## 2023-04-29 DIAGNOSIS — E1165 Type 2 diabetes mellitus with hyperglycemia: Secondary | ICD-10-CM | POA: Diagnosis not present

## 2023-04-29 DIAGNOSIS — F1010 Alcohol abuse, uncomplicated: Secondary | ICD-10-CM | POA: Diagnosis present

## 2023-04-29 DIAGNOSIS — Z1152 Encounter for screening for COVID-19: Secondary | ICD-10-CM | POA: Diagnosis not present

## 2023-04-29 DIAGNOSIS — Z6833 Body mass index (BMI) 33.0-33.9, adult: Secondary | ICD-10-CM | POA: Diagnosis not present

## 2023-04-29 DIAGNOSIS — Z87891 Personal history of nicotine dependence: Secondary | ICD-10-CM | POA: Diagnosis not present

## 2023-04-29 DIAGNOSIS — E8729 Other acidosis: Secondary | ICD-10-CM | POA: Diagnosis present

## 2023-04-29 DIAGNOSIS — E0590 Thyrotoxicosis, unspecified without thyrotoxic crisis or storm: Secondary | ICD-10-CM | POA: Diagnosis present

## 2023-04-29 DIAGNOSIS — E1159 Type 2 diabetes mellitus with other circulatory complications: Secondary | ICD-10-CM | POA: Diagnosis present

## 2023-04-29 DIAGNOSIS — Z79899 Other long term (current) drug therapy: Secondary | ICD-10-CM | POA: Diagnosis not present

## 2023-04-29 DIAGNOSIS — Z833 Family history of diabetes mellitus: Secondary | ICD-10-CM | POA: Diagnosis not present

## 2023-04-29 DIAGNOSIS — I152 Hypertension secondary to endocrine disorders: Secondary | ICD-10-CM | POA: Diagnosis present

## 2023-04-29 LAB — BASIC METABOLIC PANEL WITH GFR
Anion gap: 18 — ABNORMAL HIGH (ref 5–15)
BUN: 20 mg/dL (ref 6–20)
CO2: 14 mmol/L — ABNORMAL LOW (ref 22–32)
Calcium: 7.4 mg/dL — ABNORMAL LOW (ref 8.9–10.3)
Chloride: 98 mmol/L (ref 98–111)
Creatinine, Ser: 1.08 mg/dL — ABNORMAL HIGH (ref 0.44–1.00)
GFR, Estimated: >60 mL/min (ref >60)
Glucose, Bld: 476 mg/dL — ABNORMAL HIGH (ref 70–99)
Potassium: 4.2 mmol/L (ref 3.5–5.1)
Sodium: 130 mmol/L — ABNORMAL LOW (ref 135–145)

## 2023-04-29 LAB — CBC
HCT: 33.5 % — ABNORMAL LOW (ref 36.0–46.0)
Hemoglobin: 11.1 g/dL — ABNORMAL LOW (ref 12.0–15.0)
MCH: 27.6 pg (ref 26.0–34.0)
MCHC: 33.1 g/dL (ref 30.0–36.0)
MCV: 83.3 fL (ref 80.0–100.0)
Platelets: 345 10*3/uL (ref 150–400)
RBC: 4.02 MIL/uL (ref 3.87–5.11)
RDW: 14.3 % (ref 11.5–15.5)
WBC: 4.4 10*3/uL (ref 4.0–10.5)
nRBC: 0 % (ref 0.0–0.2)

## 2023-04-29 LAB — GLUCOSE, CAPILLARY
Glucose-Capillary: 234 mg/dL — ABNORMAL HIGH (ref 70–99)
Glucose-Capillary: 246 mg/dL — ABNORMAL HIGH (ref 70–99)
Glucose-Capillary: 137 mg/dL — ABNORMAL HIGH (ref 70–99)
Glucose-Capillary: 106 mg/dL — ABNORMAL HIGH (ref 70–99)

## 2023-04-29 LAB — COMPREHENSIVE METABOLIC PANEL WITH GFR
ALT: 28 U/L (ref 0–44)
AST: 27 U/L (ref 15–41)
Albumin: 3.5 g/dL (ref 3.5–5.0)
Alkaline Phosphatase: 67 U/L (ref 38–126)
Anion gap: 19 — ABNORMAL HIGH (ref 5–15)
BUN: 17 mg/dL (ref 6–20)
CO2: 15 mmol/L — ABNORMAL LOW (ref 22–32)
Calcium: 7.9 mg/dL — ABNORMAL LOW (ref 8.9–10.3)
Chloride: 97 mmol/L — ABNORMAL LOW (ref 98–111)
Creatinine, Ser: 0.96 mg/dL (ref 0.44–1.00)
GFR, Estimated: >60 mL/min (ref >60)
Glucose, Bld: 259 mg/dL — ABNORMAL HIGH (ref 70–99)
Potassium: 3.8 mmol/L (ref 3.5–5.1)
Sodium: 131 mmol/L — ABNORMAL LOW (ref 135–145)
Total Bilirubin: 2.1 mg/dL — ABNORMAL HIGH (ref 0.0–1.2)
Total Protein: 7.3 g/dL (ref 6.5–8.1)

## 2023-04-29 LAB — T4, FREE: Free T4: 0.98 ng/dL (ref 0.61–1.12)

## 2023-04-29 LAB — LACTIC ACID, PLASMA: Lactic Acid, Venous: 1.1 mmol/L (ref 0.5–1.9)

## 2023-04-29 LAB — HEMOGLOBIN A1C
Hgb A1c MFr Bld: 12.5 % — ABNORMAL HIGH (ref 4.8–5.6)
Mean Plasma Glucose: 312 mg/dL

## 2023-04-29 LAB — CBG MONITORING, ED
Glucose-Capillary: 205 mg/dL — ABNORMAL HIGH (ref 70–99)
Glucose-Capillary: 221 mg/dL — ABNORMAL HIGH (ref 70–99)
Glucose-Capillary: 231 mg/dL — ABNORMAL HIGH (ref 70–99)
Glucose-Capillary: 248 mg/dL — ABNORMAL HIGH (ref 70–99)
Glucose-Capillary: 269 mg/dL — ABNORMAL HIGH (ref 70–99)

## 2023-04-29 LAB — MRSA NEXT GEN BY PCR, NASAL: MRSA by PCR Next Gen: NOT DETECTED

## 2023-04-29 LAB — MAGNESIUM: Magnesium: 2.1 mg/dL (ref 1.7–2.4)

## 2023-04-29 LAB — PHOSPHORUS: Phosphorus: <1 mg/dL — CL (ref 2.5–4.6)

## 2023-04-29 LAB — TROPONIN I (HIGH SENSITIVITY): Troponin I (High Sensitivity): 5 ng/L (ref <18)

## 2023-04-29 LAB — PREALBUMIN: Prealbumin: 28 mg/dL (ref 18–38)

## 2023-04-29 MED ORDER — HYDROCODONE-ACETAMINOPHEN 5-325 MG PO TABS
1.0000 | ORAL_TABLET | ORAL | Status: DC | PRN
  Administered 2023-04-29 – 2023-04-30 (×2): 2 via ORAL
  Filled 2023-04-29 (×2): qty 2

## 2023-04-29 MED ORDER — POTASSIUM PHOSPHATES 15 MMOLE/5ML IV SOLN
30.0000 mmol | Freq: Once | INTRAVENOUS | Status: AC
  Administered 2023-04-29: 30 mmol via INTRAVENOUS
  Filled 2023-04-29: qty 10

## 2023-04-29 MED ORDER — ONDANSETRON HCL 4 MG/2ML IJ SOLN
4.0000 mg | Freq: Four times a day (QID) | INTRAMUSCULAR | Status: DC
  Administered 2023-04-29 – 2023-04-30 (×5): 4 mg via INTRAVENOUS
  Filled 2023-04-29 (×5): qty 2

## 2023-04-29 MED ORDER — INSULIN GLARGINE W/ TRANS PORT 100 UNIT/ML ~~LOC~~ SOPN: 10.0000 [IU] | PEN_INJECTOR | Freq: Every day | SUBCUTANEOUS | Status: DC

## 2023-04-29 MED ORDER — ACETAMINOPHEN 650 MG RE SUPP: 650.0000 mg | Freq: Four times a day (QID) | RECTAL | Status: DC | PRN

## 2023-04-29 MED ORDER — PANTOPRAZOLE SODIUM 40 MG PO TBEC: 40.0000 mg | DELAYED_RELEASE_TABLET | Freq: Every day | ORAL | Status: DC

## 2023-04-29 MED ORDER — ATORVASTATIN CALCIUM 40 MG PO TABS
40.0000 mg | ORAL_TABLET | Freq: Every day | ORAL | Status: DC
  Administered 2023-04-29 – 2023-04-30 (×2): 40 mg via ORAL
  Filled 2023-04-29 (×2): qty 1

## 2023-04-29 MED ORDER — ONDANSETRON HCL 4 MG/2ML IJ SOLN: 4.0000 mg | Freq: Four times a day (QID) | INTRAMUSCULAR | Status: DC | PRN

## 2023-04-29 MED ORDER — DICYCLOMINE HCL 10 MG PO CAPS: 10.0000 mg | ORAL_CAPSULE | Freq: Three times a day (TID) | ORAL | Status: DC | PRN

## 2023-04-29 MED ORDER — ASPIRIN-ACETAMINOPHEN-CAFFEINE 250-250-65 MG PO TABS: 2.0000 | ORAL_TABLET | Freq: Four times a day (QID) | ORAL | Status: DC | PRN

## 2023-04-29 MED ORDER — INSULIN ASPART 100 UNIT/ML IJ SOLN
0.0000 [IU] | INTRAMUSCULAR | Status: DC
  Administered 2023-04-29: 7 [IU] via SUBCUTANEOUS
  Administered 2023-04-29: 3 [IU] via SUBCUTANEOUS
  Administered 2023-04-29: 7 [IU] via SUBCUTANEOUS
  Administered 2023-04-30: 4 [IU] via SUBCUTANEOUS
  Administered 2023-04-30: 7 [IU] via SUBCUTANEOUS
  Filled 2023-04-29: qty 0.2

## 2023-04-29 MED ORDER — PANTOPRAZOLE SODIUM 40 MG IV SOLR
40.0000 mg | Freq: Two times a day (BID) | INTRAVENOUS | Status: DC
  Administered 2023-04-29 – 2023-04-30 (×3): 40 mg via INTRAVENOUS
  Filled 2023-04-29 (×3): qty 10

## 2023-04-29 MED ORDER — SODIUM CHLORIDE 0.45 % IV SOLN: INTRAVENOUS | Status: DC

## 2023-04-29 MED ORDER — METOCLOPRAMIDE HCL 5 MG/ML IJ SOLN
10.0000 mg | Freq: Four times a day (QID) | INTRAMUSCULAR | Status: DC | PRN
  Administered 2023-04-29 – 2023-04-30 (×2): 10 mg via INTRAVENOUS
  Filled 2023-04-29 (×2): qty 2

## 2023-04-29 MED ORDER — ACETAMINOPHEN 325 MG PO TABS: 650.0000 mg | ORAL_TABLET | Freq: Four times a day (QID) | ORAL | Status: DC | PRN

## 2023-04-29 MED ORDER — ALUM & MAG HYDROXIDE-SIMETH 200-200-20 MG/5ML PO SUSP: 30.0000 mL | Freq: Four times a day (QID) | ORAL | Status: DC | PRN

## 2023-04-29 MED ORDER — ORAL CARE MOUTH RINSE: 15.0000 mL | OROMUCOSAL | Status: DC | PRN

## 2023-04-29 MED ORDER — CHLORHEXIDINE GLUCONATE CLOTH 2 % EX PADS
6.0000 | MEDICATED_PAD | Freq: Every day | CUTANEOUS | Status: DC
Start: 2023-04-29 — End: 2023-04-30
  Administered 2023-04-29: 6 via TOPICAL

## 2023-04-29 MED ORDER — FENTANYL CITRATE PF 50 MCG/ML IJ SOSY: 12.5000 ug | PREFILLED_SYRINGE | INTRAMUSCULAR | Status: DC | PRN

## 2023-04-29 MED ORDER — STERILE WATER FOR INJECTION IV SOLN
INTRAVENOUS | Status: DC
  Filled 2023-04-29 (×2): qty 150

## 2023-04-29 MED ORDER — ONDANSETRON HCL 4 MG PO TABS: 4.0000 mg | ORAL_TABLET | Freq: Four times a day (QID) | ORAL | Status: DC | PRN

## 2023-04-29 MED ORDER — INSULIN GLARGINE-YFGN 100 UNIT/ML ~~LOC~~ SOLN
15.0000 [IU] | Freq: Every day | SUBCUTANEOUS | Status: DC
  Administered 2023-04-29 – 2023-04-30 (×2): 15 [IU] via SUBCUTANEOUS
  Filled 2023-04-29 (×3): qty 0.15

## 2023-04-29 NOTE — ED Notes (Signed)
 Patient bed linens and clothing changed due to bed soiled with urine. Patient provided with pad and mesh panties due to her being on her menstrual.

## 2023-04-29 NOTE — Plan of Care (Signed)
  Problem: Metabolic: Goal: Ability to maintain appropriate glucose levels will improve Outcome: Progressing   Problem: Nutritional: Goal: Maintenance of adequate nutrition will improve Outcome: Progressing   Problem: Education: Goal: Knowledge of General Education information will improve Description: Including pain rating scale, medication(s)/side effects and non-pharmacologic comfort measures Outcome: Progressing   Problem: Coping: Goal: Level of anxiety will decrease Outcome: Progressing   Problem: Pain Managment: Goal: General experience of comfort will improve and/or be controlled Outcome: Progressing

## 2023-04-29 NOTE — Inpatient Diabetes Management (Signed)
 Inpatient Diabetes Program Recommendations  AACE/ADA: New Consensus Statement on Inpatient Glycemic Control (2015)  Target Ranges:  Prepandial:   less than 140 mg/dL      Peak postprandial:   less than 180 mg/dL (1-2 hours)      Critically ill patients:  140 - 180 mg/dL   Lab Results  Component Value Date   GLUCAP 221 (H) 04/29/2023   HGBA1C 7.9 (H) 01/19/2023    Review of Glycemic Control  Latest Reference Range & Units 04/28/23 14:42  CO2 22 - 32 mmol/L 10 (L)  Glucose 70 - 99 mg/dL 403 (H)  (L): Data is abnormally low (H): Data is abnormally high  Diabetes history: DM2 Outpatient Diabetes medications: Basaglar 22 units every day, Humalog 3 units TID, Freestyle Libre 3 Current orders for Inpatient glycemic control: Novolog 0-9 unist Q4H  Received consult for DKA/HHS/Hyperglycemia.  Admitted with alcoholic ketoacidosis. Will see patient when appropriate.   Will continue to follow while inpatient.  Thank you, Hays Lipschutz, MSN, CDCES Diabetes Coordinator Inpatient Diabetes Program [phone] (team pager from 8a-5p)

## 2023-04-29 NOTE — ED Notes (Signed)
 Patient went to bathroom but said she could not urinate.She has been in a brief all night and stated she didn't know when she was going. So she has repeatedly urinated in a brief all night.

## 2023-04-29 NOTE — Progress Notes (Signed)
 PROGRESS NOTE    Yolanda Flores  ATF:[id number] DOB: 1985-08-23 DOA: 04/28/2023 PCP: Marcine Matar, MD   Brief Narrative:  38 year old female with history of hypertension, hyperlipidemia, diabetes mellitus type 2, neuropathy, alcohol abuse, tobacco abuse, alcoholic pancreatitis presented with generalized bodyaches and vomiting with recent alcohol binge and noncompliance to insulin.  On presentation, sodium was 129, bicarb of 10, anion gap of 29, blood alcohol level of 191.  COVID/influenza/RSV PCR negative.  She was started on IV fluids and antiemetics.    Assessment & Plan:   Possible alcoholic ketoacidosis causing high anion gap metabolic acidosis Lactic acidosis: From above Intractable nausea and vomiting -Presented with vomiting after recent alcohol binge and noncompliance to insulin.  Bicarb improving but still low at 14.  Will start bicarb drip. -Currently on clear liquid diet: Advance diet as tolerated.  Continue antiemetics as needed.  Repeat a.m. labs - Continue sucralfate.  Start Protonix 40 mg IV every 12 hours for now.  Acute kidney injury Dehydration -Continue IV fluids.  Repeat a.m. labs  Hyponatremia -Continue IV fluids.  Monitor.  Diabetes mellitus type 2 with hyperglycemia -CBGs with SSI  Alcohol abuse - Counseled regarding abstinence.  Continue CIWA protocol.  Continue thiamine, folic acid and multivitamin - TOC consult  Obesity class I - Outpatient follow-up  Hyperthyroidism - Outpatient follow-up with PCP and/or endocrinology  DVT prophylaxis: SCDs Code Status: Full Family Communication: None at bedside Disposition Plan: Status is: Observation The patient will require care spanning > 2 midnights and should be moved to inpatient because: Of severity of illness.  Need for IV fluids.    Consultants: None  Procedures: None  Antimicrobials: None   Subjective: Patient seen and examined at bedside.  Still complains of intermittent nausea but  feels slightly better.  Had vomiting earlier this morning.  Complains of indigestion.  No chest pain or shortness of breath reported.  Objective: Vitals:   04/29/23 0633 04/29/23 0700 04/29/23 0830 04/29/23 0900  BP:  (!) 134/100 (!) 165/118   Pulse:  (!) 104 96 (!) 101  Resp:   18 (!) 22  Temp: 98.5 F (36.9 C)     TempSrc: Oral     SpO2:  100% 100% 100%  Weight:      Height:       No intake or output data in the 24 hours ending 04/29/23 1009 Filed Weights   04/28/23 1429  Weight: 97.1 kg    Examination:  General exam: Appears calm and comfortable  Respiratory system: Bilateral decreased breath sounds at bases Cardiovascular system: S1 & S2 heard, intermittently tachycardic Gastrointestinal system: Abdomen is obese, nondistended, soft and nontender. Normal bowel sounds heard. Extremities: No cyanosis, clubbing, edema  Central nervous system: Alert and oriented. No focal neurological deficits. Moving extremities Skin: No rashes, lesions or ulcers Psychiatry: Flat affect.  Not agitated.    Data Reviewed: I have personally reviewed following labs and imaging studies  CBC: Recent Labs  Lab 04/28/23 1442  WBC 8.0  NEUTROABS 6.1  HGB 12.8  HCT 39.7  MCV 84.1  PLT 419*   Basic Metabolic Panel: Recent Labs  Lab 04/28/23 1442 04/28/23 2114 04/29/23 0927  NA 129* 135 130*  K 4.6 4.1 4.2  CL 90* 98 98  CO2 10* 12* 14*  GLUCOSE 253* 208* 476*  BUN 23* 20 20  CREATININE 1.09* 1.05* 1.08*  CALCIUM 8.4* 7.3* 7.4*  MG  --  1.9  --   PHOS  --  1.9*  --  GFR: Estimated Creatinine Clearance: 87 mL/min (A) (by C-G formula based on SCr of 1.08 mg/dL (H)). Liver Function Tests: Recent Labs  Lab 04/28/23 1442  AST 30  ALT 34  ALKPHOS 88  BILITOT 1.5*  PROT 8.9*  ALBUMIN 4.1   Recent Labs  Lab 04/28/23 1442  LIPASE 41   No results for input(s): "AMMONIA" in the last 168 hours. Coagulation Profile: No results for input(s): "INR", "PROTIME" in the last 168  hours. Cardiac Enzymes: Recent Labs  Lab 04/28/23 2114  CKTOTAL 27*   BNP (last 3 results) No results for input(s): "PROBNP" in the last 8760 hours. HbA1C: No results for input(s): "HGBA1C" in the last 72 hours. CBG: Recent Labs  Lab 04/28/23 2007 04/29/23 0009 04/29/23 0151 04/29/23 0608 04/29/23 0801  GLUCAP 233* 231* 248* 205* 221*   Lipid Profile: No results for input(s): "CHOL", "HDL", "LDLCALC", "TRIG", "CHOLHDL", "LDLDIRECT" in the last 72 hours. Thyroid Function Tests: Recent Labs    04/28/23 1442  TSH 0.253*  FREET4 0.98   Anemia Panel: No results for input(s): "VITAMINB12", "FOLATE", "FERRITIN", "TIBC", "IRON", "RETICCTPCT" in the last 72 hours. Sepsis Labs: Recent Labs  Lab 04/28/23 2051  LATICACIDVEN 3.1*    Recent Results (from the past 240 hours)  Resp panel by RT-PCR (RSV, Flu A&B, Covid) Anterior Nasal Swab     Status: None   Collection Time: 04/28/23  2:42 PM   Specimen: Anterior Nasal Swab  Result Value Ref Range Status   SARS Coronavirus 2 by RT PCR NEGATIVE NEGATIVE Final    Comment: (NOTE) SARS-CoV-2 target nucleic acids are NOT DETECTED.  The SARS-CoV-2 RNA is generally detectable in upper respiratory specimens during the acute phase of infection. The lowest concentration of SARS-CoV-2 viral copies this assay can detect is 138 copies/mL. A negative result does not preclude SARS-Cov-2 infection and should not be used as the sole basis for treatment or other patient management decisions. A negative result may occur with  improper specimen collection/handling, submission of specimen other than nasopharyngeal swab, presence of viral mutation(s) within the areas targeted by this assay, and inadequate number of viral copies(<138 copies/mL). A negative result must be combined with clinical observations, patient history, and epidemiological information. The expected result is Negative.  Fact Sheet for Patients:   BloggerCourse.com  Fact Sheet for Healthcare Providers:  SeriousBroker.it  This test is no t yet approved or cleared by the [place]  FDA and  has been authorized for detection and/or diagnosis of SARS-CoV-2 by FDA under an Emergency Use Authorization (EUA). This EUA will remain  in effect (meaning this test can be used) for the duration of the COVID-19 declaration under Section 564(b)(1) of the Act, 21 U.S.C.section 360bbb-3(b)(1), unless the authorization is terminated  or revoked sooner.       Influenza A by PCR NEGATIVE NEGATIVE Final   Influenza B by PCR NEGATIVE NEGATIVE Final    Comment: (NOTE) The Xpert Xpress SARS-CoV-2/FLU/RSV plus assay is intended as an aid in the diagnosis of influenza from Nasopharyngeal swab specimens and should not be used as a sole basis for treatment. Nasal washings and aspirates are unacceptable for Xpert Xpress SARS-CoV-2/FLU/RSV testing.  Fact Sheet for Patients: BloggerCourse.com  Fact Sheet for Healthcare Providers: SeriousBroker.it  This test is not yet approved or cleared by the [place]  FDA and has been authorized for detection and/or diagnosis of SARS-CoV-2 by FDA under an Emergency Use Authorization (EUA). This EUA will remain in effect (meaning this test can be used)  for the duration of the COVID-19 declaration under Section 564(b)(1) of the Act, 21 U.S.C. section 360bbb-3(b)(1), unless the authorization is terminated or revoked.     Resp Syncytial Virus by PCR NEGATIVE NEGATIVE Final    Comment: (NOTE) Fact Sheet for Patients: BloggerCourse.com  Fact Sheet for Healthcare Providers: SeriousBroker.it  This test is not yet approved or cleared by the [place]  FDA and has been authorized for detection and/or diagnosis of SARS-CoV-2 by FDA under an Emergency Use  Authorization (EUA). This EUA will remain in effect (meaning this test can be used) for the duration of the COVID-19 declaration under Section 564(b)(1) of the Act, 21 U.S.C. section 360bbb-3(b)(1), unless the authorization is terminated or revoked.  Performed at [facility], [address], [place], [place] [address]          Radiology Studies: DG Chest 1 View Result Date: 04/28/2023 CLINICAL DATA:  shortness of breath EXAM: CHEST  1 VIEW COMPARISON:  01/17/2023. FINDINGS: Cardiac silhouette is unremarkable. No pneumothorax or pleural effusion. The lungs are clear. The visualized skeletal structures are unremarkable. IMPRESSION: No acute cardiopulmonary process. Electronically Signed   By: Sydell Eva M.D.   On: 04/28/2023 19:38        Scheduled Meds:  folic acid  1 mg Oral Daily   insulin aspart  0-9 Units Subcutaneous Q4H   metoprolol tartrate  12.5 mg Oral BID   multivitamin with minerals  1 tablet Oral Daily   ondansetron (ZOFRAN) IV  4 mg Intravenous Once   sucralfate  1 g Oral TID WC & HS   thiamine  100 mg Oral Daily   Or   thiamine  100 mg Intravenous Daily   thiamine (VITAMIN B1) injection  100 mg Intravenous Daily   Continuous Infusions:  dextrose 5 % and 0.45 % NaCl 50 mL/hr at 04/28/23 2249          Audria Leather, MD [facility] 04/29/2023, 10:09 AM

## 2023-04-29 NOTE — ED Notes (Signed)
 Both IV infusions are still running due to not completing overnight.  It was reported that pt would not keep her arms straight so that the IV's would continue to run.  Pt cooperative at this time keeping IV's running so we can finish them.    BMP sent to lab

## 2023-04-29 NOTE — ED Notes (Signed)
 Patient urinated on herself at this time for the 3rd time. Patient stated she is unable to call for help because by the time she calls she has already peed. New linens, brief and chuck pads will be placed.

## 2023-04-30 ENCOUNTER — Inpatient Hospital Stay (HOSPITAL_COMMUNITY)

## 2023-04-30 ENCOUNTER — Encounter (HOSPITAL_COMMUNITY): Payer: Self-pay

## 2023-04-30 DIAGNOSIS — R9431 Abnormal electrocardiogram [ECG] [EKG]: Secondary | ICD-10-CM | POA: Diagnosis not present

## 2023-04-30 DIAGNOSIS — E8729 Other acidosis: Secondary | ICD-10-CM | POA: Diagnosis not present

## 2023-04-30 LAB — CBC WITH DIFFERENTIAL/PLATELET
Abs Immature Granulocytes: 0.01 10*3/uL (ref 0.00–0.07)
Basophils Absolute: 0.1 10*3/uL (ref 0.0–0.1)
Basophils Relative: 1 %
Eosinophils Absolute: 0.1 10*3/uL (ref 0.0–0.5)
Eosinophils Relative: 2 %
HCT: 33.2 % — ABNORMAL LOW (ref 36.0–46.0)
Hemoglobin: 11.3 g/dL — ABNORMAL LOW (ref 12.0–15.0)
Immature Granulocytes: 0 %
Lymphocytes Relative: 27 %
Lymphs Abs: 1.7 10*3/uL (ref 0.7–4.0)
MCH: 28 pg (ref 26.0–34.0)
MCHC: 34 g/dL (ref 30.0–36.0)
MCV: 82.2 fL (ref 80.0–100.0)
Monocytes Absolute: 0.8 10*3/uL (ref 0.1–1.0)
Monocytes Relative: 13 %
Neutro Abs: 3.5 10*3/uL (ref 1.7–7.7)
Neutrophils Relative %: 57 %
Platelets: 314 10*3/uL (ref 150–400)
RBC: 4.04 MIL/uL (ref 3.87–5.11)
RDW: 14.6 % (ref 11.5–15.5)
WBC: 6.2 10*3/uL (ref 4.0–10.5)
nRBC: 0 % (ref 0.0–0.2)

## 2023-04-30 LAB — COMPREHENSIVE METABOLIC PANEL WITH GFR
ALT: 31 U/L (ref 0–44)
AST: 34 U/L (ref 15–41)
Albumin: 3.4 g/dL — ABNORMAL LOW (ref 3.5–5.0)
Alkaline Phosphatase: 63 U/L (ref 38–126)
Anion gap: 14 (ref 5–15)
BUN: 12 mg/dL (ref 6–20)
CO2: 23 mmol/L (ref 22–32)
Calcium: 7.8 mg/dL — ABNORMAL LOW (ref 8.9–10.3)
Chloride: 97 mmol/L — ABNORMAL LOW (ref 98–111)
Creatinine, Ser: 0.81 mg/dL (ref 0.44–1.00)
GFR, Estimated: >60 mL/min (ref >60)
Glucose, Bld: 93 mg/dL (ref 70–99)
Potassium: 3.6 mmol/L (ref 3.5–5.1)
Sodium: 134 mmol/L — ABNORMAL LOW (ref 135–145)
Total Bilirubin: 1.5 mg/dL — ABNORMAL HIGH (ref 0.0–1.2)
Total Protein: 7 g/dL (ref 6.5–8.1)

## 2023-04-30 LAB — GLUCOSE, CAPILLARY
Glucose-Capillary: 115 mg/dL — ABNORMAL HIGH (ref 70–99)
Glucose-Capillary: 191 mg/dL — ABNORMAL HIGH (ref 70–99)
Glucose-Capillary: 208 mg/dL — ABNORMAL HIGH (ref 70–99)

## 2023-04-30 LAB — PHOSPHORUS: Phosphorus: 1.4 mg/dL — ABNORMAL LOW (ref 2.5–4.6)

## 2023-04-30 LAB — ECHOCARDIOGRAM COMPLETE
Area-P 1/2: 5.54 cm2
Height: 67 in
S' Lateral: 2.8 cm
Weight: 3192.26 [oz_av]

## 2023-04-30 LAB — MAGNESIUM: Magnesium: 1.8 mg/dL (ref 1.7–2.4)

## 2023-04-30 LAB — T3: T3, Total: 58 ng/dL — ABNORMAL LOW (ref 71–180)

## 2023-04-30 MED ORDER — ONDANSETRON HCL 4 MG PO TABS: 4.0000 mg | ORAL_TABLET | Freq: Three times a day (TID) | ORAL | 0 refills | Status: DC | PRN

## 2023-04-30 MED ORDER — FOLIC ACID 1 MG PO TABS: 1.0000 mg | ORAL_TABLET | Freq: Every day | ORAL | 0 refills | Status: DC

## 2023-04-30 MED ORDER — PANTOPRAZOLE SODIUM 40 MG PO TBEC: 40.0000 mg | DELAYED_RELEASE_TABLET | Freq: Two times a day (BID) | ORAL | 0 refills | Status: DC

## 2023-04-30 MED ORDER — INSULIN LISPRO (1 UNIT DIAL) 100 UNIT/ML (KWIKPEN): 6.0000 [IU] | PEN_INJECTOR | Freq: Every day | SUBCUTANEOUS | Status: DC

## 2023-04-30 MED ORDER — POTASSIUM PHOSPHATES 15 MMOLE/5ML IV SOLN
30.0000 mmol | Freq: Once | INTRAVENOUS | Status: AC
  Administered 2023-04-30: 30 mmol via INTRAVENOUS
  Filled 2023-04-30: qty 10

## 2023-04-30 MED ORDER — TRAMADOL HCL 50 MG PO TABS: 50.0000 mg | ORAL_TABLET | Freq: Four times a day (QID) | ORAL | 0 refills | Status: DC | PRN

## 2023-04-30 MED ORDER — ALUM & MAG HYDROXIDE-SIMETH 200-200-20 MG/5ML PO SUSP: 30.0000 mL | Freq: Four times a day (QID) | ORAL | 0 refills | Status: AC | PRN

## 2023-04-30 NOTE — Progress Notes (Signed)
 Echocardiogram 2D Echocardiogram has been performed.  Emmaline Haring Giorgia Wahler RDCS 04/30/2023, 9:13 AM
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
## 2023-04-30 NOTE — Discharge Summary (Signed)
 Physician Discharge Summary  Yolanda Flores WUJ:[id number] DOB: [birth year] DOA: 04/28/2023  PCP: Marcine Matar, MD  Admit date: 04/28/2023 Discharge date: 04/30/2023  Admitted From: Home Disposition: Home  Recommendations for Outpatient Follow-up:  Follow up with PCP in 1 week with repeat CBC/CMP Recommend evaluation and follow-up with GI if symptoms persist Abstain from alcohol Follow up in ED if symptoms worsen or new appear   Home Health: No Equipment/Devices: None  Discharge Condition: Stable CODE STATUS: Full Diet recommendation: Heart healthy/carb modified  Brief/Interim Summary: 38 year old female with history of hypertension, hyperlipidemia, diabetes mellitus type 2, neuropathy, alcohol abuse, tobacco abuse, alcoholic pancreatitis presented with generalized bodyaches and vomiting with recent alcohol binge and noncompliance to insulin.  On presentation, sodium was 129, bicarb of 10, anion gap of 29, blood alcohol level of 191.  COVID/influenza/RSV PCR negative.  She was started on IV fluids and antiemetics.   During the hospitalization, her condition has improved.  She is tolerating advanced diet.  Acidosis has resolved.  She feels better today and feels okay to go home today.  Discharge home today with outpatient follow-up with PCP.  Discharge Diagnoses:   Possible alcoholic ketoacidosis causing high anion gap metabolic acidosis Lactic acidosis: From above Intractable nausea and vomiting -Presented with vomiting after recent alcohol binge and noncompliance to insulin.  Treated with bicarb drip.  Today her acidosis has resolved. -She is tolerating advanced diet. -She feels better today and feels okay to go home today.  Discharge home today with outpatient follow-up with PCP.  Continue Protonix twice a day on discharge.   Acute kidney injury Dehydration - Treated with IV fluids.  Improved.  Outpatient follow-up.   Hyponatremia - Mild.  Treated with IV fluids.   Encourage oral intake.  Outpatient follow-up.  Diabetes mellitus type 2 with hyperglycemia - Carb modified diet.  Continue home regimen.  Alcohol abuse - Counseled regarding abstinence.  Treated with CIWA protocol.  Continue thiamine, folic acid and multivitamin - TOC consulted   Obesity class I - Outpatient follow-up   Hyperthyroidism - Outpatient follow-up with PCP and/or endocrinology  Hypophosphatemia - Replaced during the hospitalization  Normocytic anemia -Questionable cause.  Hemoglobin stable.  Outpatient follow-up  Obesity class I - Outpatient follow-up  Discharge Instructions  Discharge Instructions     Diet - low sodium heart healthy   Complete by: As directed    Diet Carb Modified   Complete by: As directed    Increase activity slowly   Complete by: As directed       Allergies as of 04/30/2023   No Known Allergies      Medication List     STOP taking these medications    magnesium oxide 400 (240 Mg) MG tablet Commonly known as: MAG-OX   omeprazole 20 MG capsule Commonly known as: PRILOSEC       TAKE these medications    alum & mag hydroxide-simeth 200-200-20 MG/5ML suspension Commonly known as: MAALOX/MYLANTA Take 30 mLs by mouth every 6 (six) hours as needed for indigestion or heartburn.   aspirin-acetaminophen-caffeine 250-250-65 MG tablet Commonly known as: EXCEDRIN MIGRAINE Take 2 tablets by mouth every 6 (six) hours as needed for headache.   atorvastatin 40 MG tablet Commonly known as: LIPITOR 1 tab PO Q M/W/Frid What changed:  how much to take how to take this when to take this additional instructions   Basaglar Tempo Pen 100 UNIT/ML Sopn Generic drug: Insulin Glargine w/ Trans Port Inject 22 Units into the skin  daily.   folic acid 1 MG tablet Commonly known as: FOLVITE Take 1 tablet (1 mg total) by mouth daily.   FreeStyle Libre 3 Sensor Misc Change Q 2 weeks   insulin lispro 100 UNIT/ML KwikPen Commonly known  as: HumaLOG KwikPen Inject 6 Units into the skin daily.   lisinopril 10 MG tablet Commonly known as: ZESTRIL Take 1 tablet (10 mg total) by mouth daily.   multivitamin with minerals Tabs tablet Take 1 tablet by mouth daily.   ondansetron 4 MG tablet Commonly known as: Zofran Take 1 tablet (4 mg total) by mouth every 8 (eight) hours as needed for nausea or vomiting.   pantoprazole 40 MG tablet Commonly known as: Protonix Take 1 tablet (40 mg total) by mouth 2 (two) times daily before a meal.   pregabalin 50 MG capsule Commonly known as: Lyrica Take 1 capsule (50 mg total) by mouth 2 (two) times daily.   thiamine 100 MG tablet Commonly known as: VITAMIN B1 Take 1 tablet (100 mg total) by mouth daily.   traMADol 50 MG tablet Commonly known as: Ultram Take 1 tablet (50 mg total) by mouth every 6 (six) hours as needed for moderate pain (pain score 4-6) or severe pain (pain score 7-10).        Follow-up Information     Lawrance Presume, MD. Schedule an appointment as soon as possible for a visit in 1 week(s).   Specialty: Internal Medicine Contact information: [address] [place] [address] [phone]                No Known Allergies  Consultations: None   Procedures/Studies: DG Chest 1 View Result Date: 04/28/2023 CLINICAL DATA:  shortness of breath EXAM: CHEST  1 VIEW COMPARISON:  01/17/2023. FINDINGS: Cardiac silhouette is unremarkable. No pneumothorax or pleural effusion. The lungs are clear. The visualized skeletal structures are unremarkable. IMPRESSION: No acute cardiopulmonary process. Electronically Signed   By: Sydell Eva M.D.   On: 04/28/2023 19:38   DG Foot Complete Left Result Date: 04/08/2023 Please see detailed radiograph report in office note.     Subjective: Patient seen and examined at bedside.  Feels slightly better.  Feels okay to go home today.  No fever, chest pain, shortness of breath reported.   Tolerating diet.  Discharge Exam: Vitals:   04/30/23 0900 04/30/23 1023  BP: (!) 135/90 (!) 122/93  Pulse: 90 81  Resp: 18   Temp:    SpO2: 96%     General: Pt is alert, awake, not in acute distress.  Slow to respond. Cardiovascular: rate controlled, S1/S2 + Respiratory: bilateral decreased breath sounds at bases Abdominal: Soft, obese, NT, ND, bowel sounds + Extremities: no edema, no cyanosis    The results of significant diagnostics from this hospitalization (including imaging, microbiology, ancillary and laboratory) are listed below for reference.     Microbiology: Recent Results (from the past 240 hours)  Resp panel by RT-PCR (RSV, Flu A&B, Covid) Anterior Nasal Swab     Status: None   Collection Time: 04/28/23  2:42 PM   Specimen: Anterior Nasal Swab  Result Value Ref Range Status   SARS Coronavirus 2 by RT PCR NEGATIVE NEGATIVE Final    Comment: (NOTE) SARS-CoV-2 target nucleic acids are NOT DETECTED.  The SARS-CoV-2 RNA is generally detectable in upper respiratory specimens during the acute phase of infection. The lowest concentration of SARS-CoV-2 viral copies this assay can detect is 138 copies/mL. A negative result  does not preclude SARS-Cov-2 infection and should not be used as the sole basis for treatment or other patient management decisions. A negative result may occur with  improper specimen collection/handling, submission of specimen other than nasopharyngeal swab, presence of viral mutation(s) within the areas targeted by this assay, and inadequate number of viral copies(<138 copies/mL). A negative result must be combined with clinical observations, patient history, and epidemiological information. The expected result is Negative.  Fact Sheet for Patients:  BloggerCourse.com  Fact Sheet for Healthcare Providers:  SeriousBroker.it  This test is no t yet approved or cleared by the [place]  FDA and   has been authorized for detection and/or diagnosis of SARS-CoV-2 by FDA under an Emergency Use Authorization (EUA). This EUA will remain  in effect (meaning this test can be used) for the duration of the COVID-19 declaration under Section 564(b)(1) of the Act, 21 U.S.C.section 360bbb-3(b)(1), unless the authorization is terminated  or revoked sooner.       Influenza A by PCR NEGATIVE NEGATIVE Final   Influenza B by PCR NEGATIVE NEGATIVE Final    Comment: (NOTE) The Xpert Xpress SARS-CoV-2/FLU/RSV plus assay is intended as an aid in the diagnosis of influenza from Nasopharyngeal swab specimens and should not be used as a sole basis for treatment. Nasal washings and aspirates are unacceptable for Xpert Xpress SARS-CoV-2/FLU/RSV testing.  Fact Sheet for Patients: BloggerCourse.com  Fact Sheet for Healthcare Providers: SeriousBroker.it  This test is not yet approved or cleared by the [place]  FDA and has been authorized for detection and/or diagnosis of SARS-CoV-2 by FDA under an Emergency Use Authorization (EUA). This EUA will remain in effect (meaning this test can be used) for the duration of the COVID-19 declaration under Section 564(b)(1) of the Act, 21 U.S.C. section 360bbb-3(b)(1), unless the authorization is terminated or revoked.     Resp Syncytial Virus by PCR NEGATIVE NEGATIVE Final    Comment: (NOTE) Fact Sheet for Patients: BloggerCourse.com  Fact Sheet for Healthcare Providers: SeriousBroker.it  This test is not yet approved or cleared by the [place]  FDA and has been authorized for detection and/or diagnosis of SARS-CoV-2 by FDA under an Emergency Use Authorization (EUA). This EUA will remain in effect (meaning this test can be used) for the duration of the COVID-19 declaration under Section 564(b)(1) of the Act, 21 U.S.C. section 360bbb-3(b)(1),  unless the authorization is terminated or revoked.  Performed at [facility], [address], [place], [place] [address]   MRSA Next Gen by PCR, Nasal     Status: None   Collection Time: 04/29/23  2:19 PM   Specimen: Nasal Mucosa; Nasal Swab  Result Value Ref Range Status   MRSA by PCR Next Gen NOT DETECTED NOT DETECTED Final    Comment: (NOTE) The GeneXpert MRSA Assay (FDA approved for NASAL specimens only), is one component of a comprehensive MRSA colonization surveillance program. It is not intended to diagnose MRSA infection nor to guide or monitor treatment for MRSA infections. Test performance is not FDA approved in patients less than 10 years old. Performed at [facility], [address] [place], [place] [address]      Labs: BNP (last 3 results) No results for input(s): "BNP" in the last 8760 hours. Basic Metabolic Panel: Recent Labs  Lab 04/28/23 1442 04/28/23 2114 04/29/23 0927 04/29/23 1437 04/30/23 0316  NA 129* 135 130* 131* 134*  K 4.6 4.1 4.2 3.8 3.6  CL 90* 98 98 97* 97*  CO2  10* 12* 14* 15* 23  GLUCOSE 253* 208* 476* 259* 93  BUN 23* 20 20 17 12   CREATININE 1.09* 1.05* 1.08* 0.96 0.81  CALCIUM 8.4* 7.3* 7.4* 7.9* 7.8*  MG  --  1.9  --  2.1 1.8  PHOS  --  1.9*  --  <1.0* 1.4*   Liver Function Tests: Recent Labs  Lab 04/28/23 1442 04/29/23 1437 04/30/23 0316  AST 30 27 34  ALT 34 28 31  ALKPHOS 88 67 63  BILITOT 1.5* 2.1* 1.5*  PROT 8.9* 7.3 7.0  ALBUMIN 4.1 3.5 3.4*   Recent Labs  Lab 04/28/23 1442  LIPASE 41   No results for input(s): "AMMONIA" in the last 168 hours. CBC: Recent Labs  Lab 04/28/23 1442 04/29/23 1437 04/30/23 0316  WBC 8.0 4.4 6.2  NEUTROABS 6.1  --  3.5  HGB 12.8 11.1* 11.3*  HCT 39.7 33.5* 33.2*  MCV 84.1 83.3 82.2  PLT 419* 345 314   Cardiac Enzymes: Recent Labs  Lab 04/28/23 2114  CKTOTAL 27*   BNP: Invalid input(s): "POCBNP" CBG: Recent Labs  Lab  04/29/23 1543 04/29/23 2002 04/29/23 2307 04/30/23 0343 04/30/23 0725  GLUCAP 246* 137* 106* 115* 191*   D-Dimer No results for input(s): "DDIMER" in the last 72 hours. Hgb A1c Recent Labs    04/28/23 1442  HGBA1C 12.5*   Lipid Profile No results for input(s): "CHOL", "HDL", "LDLCALC", "TRIG", "CHOLHDL", "LDLDIRECT" in the last 72 hours. Thyroid function studies Recent Labs    04/28/23 1442  TSH 0.253*   Anemia work up No results for input(s): "VITAMINB12", "FOLATE", "FERRITIN", "TIBC", "IRON", "RETICCTPCT" in the last 72 hours. Urinalysis    Component Value Date/Time   COLORURINE STRAW (A) 04/28/2023 1610   APPEARANCEUR CLEAR 04/28/2023 1610   LABSPEC 1.012 04/28/2023 1610   PHURINE 5.0 04/28/2023 1610   GLUCOSEU >=500 (A) 04/28/2023 1610   HGBUR MODERATE (A) 04/28/2023 1610   BILIRUBINUR NEGATIVE 04/28/2023 1610   KETONESUR 80 (A) 04/28/2023 1610   PROTEINUR 100 (A) 04/28/2023 1610   UROBILINOGEN 2.0 (H) 12/31/2009 1757   NITRITE NEGATIVE 04/28/2023 1610   LEUKOCYTESUR NEGATIVE 04/28/2023 1610   Sepsis Labs Recent Labs  Lab 04/28/23 1442 04/29/23 1437 04/30/23 0316  WBC 8.0 4.4 6.2   Microbiology Recent Results (from the past 240 hours)  Resp panel by RT-PCR (RSV, Flu A&B, Covid) Anterior Nasal Swab     Status: None   Collection Time: 04/28/23  2:42 PM   Specimen: Anterior Nasal Swab  Result Value Ref Range Status   SARS Coronavirus 2 by RT PCR NEGATIVE NEGATIVE Final    Comment: (NOTE) SARS-CoV-2 target nucleic acids are NOT DETECTED.  The SARS-CoV-2 RNA is generally detectable in upper respiratory specimens during the acute phase of infection. The lowest concentration of SARS-CoV-2 viral copies this assay can detect is 138 copies/mL. A negative result does not preclude SARS-Cov-2 infection and should not be used as the sole basis for treatment or other patient management decisions. A negative result may occur with  improper specimen  collection/handling, submission of specimen other than nasopharyngeal swab, presence of viral mutation(s) within the areas targeted by this assay, and inadequate number of viral copies(<138 copies/mL). A negative result must be combined with clinical observations, patient history, and epidemiological information. The expected result is Negative.  Fact Sheet for Patients:  BloggerCourse.com  Fact Sheet for Healthcare Providers:  SeriousBroker.it  This test is no t yet approved or cleared by the [place] FDA  and  has been authorized for detection and/or diagnosis of SARS-CoV-2 by FDA under an Emergency Use Authorization (EUA). This EUA will remain  in effect (meaning this test can be used) for the duration of the COVID-19 declaration under Section 564(b)(1) of the Act, 21 U.S.C.section 360bbb-3(b)(1), unless the authorization is terminated  or revoked sooner.       Influenza A by PCR NEGATIVE NEGATIVE Final   Influenza B by PCR NEGATIVE NEGATIVE Final    Comment: (NOTE) The Xpert Xpress SARS-CoV-2/FLU/RSV plus assay is intended as an aid in the diagnosis of influenza from Nasopharyngeal swab specimens and should not be used as a sole basis for treatment. Nasal washings and aspirates are unacceptable for Xpert Xpress SARS-CoV-2/FLU/RSV testing.  Fact Sheet for Patients: BloggerCourse.com  Fact Sheet for Healthcare Providers: SeriousBroker.it  This test is not yet approved or cleared by the [place]  FDA and has been authorized for detection and/or diagnosis of SARS-CoV-2 by FDA under an Emergency Use Authorization (EUA). This EUA will remain in effect (meaning this test can be used) for the duration of the COVID-19 declaration under Section 564(b)(1) of the Act, 21 U.S.C. section 360bbb-3(b)(1), unless the authorization is terminated or revoked.     Resp Syncytial  Virus by PCR NEGATIVE NEGATIVE Final    Comment: (NOTE) Fact Sheet for Patients: BloggerCourse.com  Fact Sheet for Healthcare Providers: SeriousBroker.it  This test is not yet approved or cleared by the [place]  FDA and has been authorized for detection and/or diagnosis of SARS-CoV-2 by FDA under an Emergency Use Authorization (EUA). This EUA will remain in effect (meaning this test can be used) for the duration of the COVID-19 declaration under Section 564(b)(1) of the Act, 21 U.S.C. section 360bbb-3(b)(1), unless the authorization is terminated or revoked.  Performed at [facility], [address] [place], [place] [address]   MRSA Next Gen by PCR, Nasal     Status: None   Collection Time: 04/29/23  2:19 PM   Specimen: Nasal Mucosa; Nasal Swab  Result Value Ref Range Status   MRSA by PCR Next Gen NOT DETECTED NOT DETECTED Final    Comment: (NOTE) The GeneXpert MRSA Assay (FDA approved for NASAL specimens only), is one component of a comprehensive MRSA colonization surveillance program. It is not intended to diagnose MRSA infection nor to guide or monitor treatment for MRSA infections. Test performance is not FDA approved in patients less than 57 years old. Performed at [facility], [address] [place], [place] [address]      Time coordinating discharge: 35 minutes  SIGNED:   Audria Leather, MD  [facility] 04/30/2023, 10:36 AM

## 2023-05-01 ENCOUNTER — Telehealth: Payer: Self-pay | Admitting: *Deleted

## 2023-05-01 NOTE — Transitions of Care (Post Inpatient/ED Visit) (Signed)
   05/01/2023  Name: Yolanda Flores MRN: [id number] DOB: 19-Mar-1985  Today's TOC FU Call Status: Today's TOC FU Call Status:: Unsuccessful Call (1st Attempt) Unsuccessful Call (1st Attempt) Date: 05/01/23  Attempted to reach the patient regarding the most recent Inpatient/ED visit.  Follow Up Plan: Additional outreach attempts will be made to reach the patient to complete the Transitions of Care (Post Inpatient/ED visit) call.   Arna Better RN, BSN [place]  [facility] Health RN Care Manager [phone]

## 2023-05-04 ENCOUNTER — Telehealth: Payer: Self-pay | Admitting: *Deleted

## 2023-05-04 NOTE — Transitions of Care (Post Inpatient/ED Visit) (Signed)
   05/04/2023  Name: ILARIA MUCH MRN: [id number] DOB: [birth year]  Today's TOC FU Call Status: Today's TOC FU Call Status:: Unsuccessful Call (2nd Attempt) Unsuccessful Call (2nd Attempt) Date: 05/04/23  Attempted to reach the patient regarding the most recent Inpatient/ED visit.  Follow Up Plan: Additional outreach attempts will be made to reach the patient to complete the Transitions of Care (Post Inpatient/ED visit) call.   Arna Better RN, BSN [place]  [facility] Health RN Care Manager [phone]

## 2023-05-05 ENCOUNTER — Telehealth: Payer: Self-pay | Admitting: *Deleted

## 2023-05-05 NOTE — Transitions of Care (Post Inpatient/ED Visit) (Signed)
   05/05/2023  Name: Yolanda Flores MRN: [id number] DOB: [birth year]  Today's TOC FU Call Status: Today's TOC FU Call Status:: Unsuccessful Call (3rd Attempt) Unsuccessful Call (3rd Attempt) Date: 05/05/23  Attempted to reach the patient regarding the most recent Inpatient/ED visit.  Follow Up Plan: No further outreach attempts will be made at this time. We have been unable to contact the patient.  Arna Better RN, BSN [name]  [facility] RN Care Manager [phone]

## 2023-05-11 ENCOUNTER — Ambulatory Visit: Payer: Commercial Managed Care - HMO | Admitting: Internal Medicine

## 2023-05-22 ENCOUNTER — Telehealth: Payer: Self-pay | Admitting: Internal Medicine

## 2023-05-22 ENCOUNTER — Other Ambulatory Visit: Payer: Self-pay | Admitting: Internal Medicine

## 2023-05-22 NOTE — Telephone Encounter (Deleted)
 Copied from CRM [id number]. Topic: Clinical - Medication Prior Auth >> May 22, 2023 11:05 AM Felizardo Hotter wrote: Reason for CRM: Pt needs prior authorization for Insulin  insulin  lispro (HUMALOG  KWIKPEN) 100 UNIT/ML KwikPen. Please call [phone] pt when acquired.

## 2023-05-22 NOTE — Telephone Encounter (Signed)
 Copied from CRM [id number]. Topic: Clinical - Medication Prior Auth >> May 22, 2023 11:05 AM Felizardo Hotter wrote: Reason for CRM: Pt needs prior authorization for Insulin  insulin  lispro (HUMALOG  KWIKPEN) 100 UNIT/ML KwikPen. Please call [phone] pt when acquired.

## 2023-05-27 ENCOUNTER — Other Ambulatory Visit: Payer: Self-pay | Admitting: Pharmacist

## 2023-05-27 MED ORDER — NOVOLOG FLEXPEN 100 UNIT/ML ~~LOC~~ SOPN: 3.0000 [IU] | PEN_INJECTOR | Freq: Two times a day (BID) | SUBCUTANEOUS | 0 refills | Status: DC

## 2023-05-27 NOTE — Telephone Encounter (Signed)
 Called but no answer. LVM informing patient that requested medication was changed and sent to the pharmacy. No PA needed.

## 2023-06-26 ENCOUNTER — Telehealth: Payer: Self-pay | Admitting: Internal Medicine

## 2023-06-26 MED ORDER — HUMALOG KWIKPEN 200 UNIT/ML ~~LOC~~ SOPN: 3.0000 [IU] | PEN_INJECTOR | Freq: Two times a day (BID) | SUBCUTANEOUS | 0 refills | Status: DC

## 2023-06-26 NOTE — Addendum Note (Signed)
 Addended by: Freada Jacobs, Mara [place] L on: 06/26/2023 04:45 PM   Modules accepted: Orders

## 2023-06-26 NOTE — Telephone Encounter (Signed)
 Per PA, insurance prefers U200 Humalog . Rxn sent.

## 2023-06-26 NOTE — Telephone Encounter (Signed)
 Copied from CRM [id number]. Topic: Clinical - Medication Prior Auth >> Jun 26, 2023  2:21 PM Precious C wrote:  Reason for CRM: Pt called in because preferred pharmacy is request a prior auth for medication- insulin  aspart (NOVOLOG  FLEXPEN) 100 UNIT/ML FlexPen, which she's currently out of it. Please call pt at [phone] pt when acquired.   [id number] [place]ette Nestle, [place] - [address] AT [facility] OF [address] [place] [address] Phone: [phone] Fax: [phone]

## 2023-07-02 ENCOUNTER — Ambulatory Visit: Admitting: Internal Medicine

## 2023-08-15 ENCOUNTER — Other Ambulatory Visit: Payer: Self-pay | Admitting: Podiatry

## 2023-08-15 ENCOUNTER — Other Ambulatory Visit: Payer: Self-pay | Admitting: Internal Medicine

## 2023-08-15 DIAGNOSIS — R1013 Epigastric pain: Secondary | ICD-10-CM

## 2023-08-26 ENCOUNTER — Other Ambulatory Visit: Payer: Self-pay | Admitting: Physician Assistant

## 2023-08-26 ENCOUNTER — Other Ambulatory Visit: Payer: Self-pay | Admitting: Internal Medicine

## 2023-08-26 DIAGNOSIS — M792 Neuralgia and neuritis, unspecified: Secondary | ICD-10-CM

## 2023-08-26 DIAGNOSIS — E1165 Type 2 diabetes mellitus with hyperglycemia: Secondary | ICD-10-CM

## 2023-08-27 ENCOUNTER — Other Ambulatory Visit: Payer: Self-pay | Admitting: Internal Medicine

## 2023-08-27 DIAGNOSIS — M792 Neuralgia and neuritis, unspecified: Secondary | ICD-10-CM

## 2023-08-27 NOTE — Telephone Encounter (Unsigned)
 Copied from CRM #[id number]. Topic: Clinical - Medication Refill >> Aug 27, 2023  1:25 PM Marissa P wrote: Medication: pregabalin  (LYRICA ) 50 MG capsule  Has the patient contacted their pharmacy? Yes (Agent: If no, request that the patient contact the pharmacy for the refill. If patient does not wish to contact the pharmacy document the reason why and proceed with request.) (Agent: If yes, when and what did the pharmacy advise?)  This is the patient's preferred pharmacy:  [id number] - [place], [place] - [address] AT [facility] OF [address] [place] [address] Phone: [phone] Fax: [phone]  Is this the correct pharmacy for this prescription? Yes If no, delete pharmacy and type the correct one.   Has the prescription been filled recently? Yes  Is the patient out of the medication? Yes  Has the patient been seen for an appointment in the last year OR does the patient have an upcoming appointment? Yes  Can we respond through MyChart? Yes  Agent: Please be advised that Rx refills may take up to 3 business days. We ask that you follow-up with your pharmacy.

## 2023-08-31 NOTE — Telephone Encounter (Signed)
 Requested medication (s) are due for refill today -no  Requested medication (s) are on the active medication list -yes  Future visit scheduled -yes  Last refill: 08/27/23 #60 1RF  Notes to clinic: non delegated Rx- duplicate request  Requested Prescriptions  Pending Prescriptions Disp Refills   pregabalin  (LYRICA ) 50 MG capsule 60 capsule 3    Sig: Take 1 capsule (50 mg total) by mouth 2 (two) times daily.     Not Delegated - Neurology:  Anticonvulsants - Controlled - pregabalin  Failed - 08/31/2023  2:11 PM      Failed - This refill cannot be delegated      Passed - Cr in normal range and within 360 days    Creatinine, Ser  Date Value Ref Range Status  04/30/2023 0.81 0.44 - 1.00 mg/dL Final   Creatinine, Urine  Date Value Ref Range Status  04/28/2023 43 mg/dL Final    Comment:    Performed at [facility], [address] [place], [place] [address]         Passed - Completed PHQ-2 or PHQ-9 in the last 360 days      Passed - Valid encounter within last 12 months    Recent Outpatient Visits           5 months ago Type 2 diabetes mellitus with obesity (HCC)   [facility] [place]. [facility] Vicci Sober B, MD   10 months ago Type 2 diabetes mellitus with obesity [facility])   [facility] Of [place]. [facility] Vicci Sober [name], MD   1 year ago Type 2 diabetes mellitus with obesity [facility])   [place] [facility] Dept Of [place]. [facility] [place], [place], [place]   1 year ago Diabetes mellitus type 2 in obese [facility])   [facility] Of [place]. [facility] [place], [place], [place]   2 years ago Diabetes mellitus type 2 in obese [facility])   [facility] [facility] Dept Of [place]. [facility] Vicci Sober [name], MD                 Requested Prescriptions  Pending Prescriptions Disp  Refills   pregabalin  (LYRICA ) 50 MG capsule 60 capsule 3    Sig: Take 1 capsule (50 mg total) by mouth 2 (two) times daily.     Not Delegated - Neurology:  Anticonvulsants - Controlled - pregabalin  Failed - 08/31/2023  2:11 PM      Failed - This refill cannot be delegated      Passed - Cr in normal range and within 360 days    Creatinine, Ser  Date Value Ref Range Status  04/30/2023 0.81 0.44 - 1.00 mg/dL Final   Creatinine, Urine  Date Value Ref Range Status  04/28/2023 43 mg/dL Final    Comment:    Performed at [facility], [address], [place], [place] [address]         Passed - Completed PHQ-2 or PHQ-9 in the last 360 days      Passed - Valid encounter within last 12 months    Recent Outpatient Visits           5 months ago Type 2 diabetes mellitus with obesity (HCC)   [facility]  [facility] [place]. [facility] Vicci Sober B, MD   10 months ago Type 2 diabetes mellitus with obesity [facility])   [place] [facility] Dept Of [place]. [facility] Vicci Sober [name], MD   1 year ago Type 2 diabetes mellitus with obesity [facility])   [place] [facility] Dept Of [place]. [facility] [place], [place], [place]   1 year ago Diabetes mellitus type 2 in obese [facility])   [facility] Dept Of [place]. [facility] [place], [place], [place]   2 years ago Diabetes mellitus type 2 in obese [facility])   [place] [facility] A Dept Of . [facility] Vicci Sober [name], MD

## 2023-09-25 ENCOUNTER — Ambulatory Visit: Admitting: Internal Medicine

## 2023-10-02 ENCOUNTER — Other Ambulatory Visit: Payer: Self-pay | Admitting: Internal Medicine

## 2023-10-02 DIAGNOSIS — E1169 Type 2 diabetes mellitus with other specified complication: Secondary | ICD-10-CM

## 2023-10-22 ENCOUNTER — Ambulatory Visit: Attending: Internal Medicine | Admitting: Internal Medicine

## 2023-10-22 ENCOUNTER — Encounter: Payer: Self-pay | Admitting: Internal Medicine

## 2023-10-22 DIAGNOSIS — E1169 Type 2 diabetes mellitus with other specified complication: Secondary | ICD-10-CM

## 2023-10-22 DIAGNOSIS — M792 Neuralgia and neuritis, unspecified: Secondary | ICD-10-CM

## 2023-10-22 DIAGNOSIS — Z23 Encounter for immunization: Secondary | ICD-10-CM

## 2023-10-22 DIAGNOSIS — E785 Hyperlipidemia, unspecified: Secondary | ICD-10-CM

## 2023-10-22 DIAGNOSIS — F1091 Alcohol use, unspecified, in remission: Secondary | ICD-10-CM

## 2023-10-22 DIAGNOSIS — Z6835 Body mass index (BMI) 35.0-35.9, adult: Secondary | ICD-10-CM

## 2023-10-22 DIAGNOSIS — Z7984 Long term (current) use of oral hypoglycemic drugs: Secondary | ICD-10-CM | POA: Diagnosis not present

## 2023-10-22 DIAGNOSIS — I152 Hypertension secondary to endocrine disorders: Secondary | ICD-10-CM

## 2023-10-22 DIAGNOSIS — Z794 Long term (current) use of insulin: Secondary | ICD-10-CM | POA: Diagnosis not present

## 2023-10-22 DIAGNOSIS — Z79899 Other long term (current) drug therapy: Secondary | ICD-10-CM

## 2023-10-22 DIAGNOSIS — E119 Type 2 diabetes mellitus without complications: Secondary | ICD-10-CM

## 2023-10-22 DIAGNOSIS — E1159 Type 2 diabetes mellitus with other circulatory complications: Secondary | ICD-10-CM

## 2023-10-22 LAB — POCT GLYCOSYLATED HEMOGLOBIN (HGB A1C): HbA1c, POC (controlled diabetic range): 11.8 % — AB (ref 0.0–7.0)

## 2023-10-22 LAB — GLUCOSE, POCT (MANUAL RESULT ENTRY): POC Glucose: 147 mg/dL — AB (ref 70–99)

## 2023-10-22 MED ORDER — BASAGLAR KWIKPEN 100 UNIT/ML ~~LOC~~ SOPN: 28.0000 [IU] | PEN_INJECTOR | Freq: Every day | SUBCUTANEOUS | 4 refills | Status: DC

## 2023-10-22 MED ORDER — LISINOPRIL 20 MG PO TABS: 20.0000 mg | ORAL_TABLET | Freq: Every day | ORAL | 1 refills | Status: DC

## 2023-10-22 MED ORDER — ROSUVASTATIN CALCIUM 5 MG PO TABS: ORAL_TABLET | ORAL | 3 refills | Status: AC

## 2023-10-22 MED ORDER — HUMALOG KWIKPEN 200 UNIT/ML ~~LOC~~ SOPN: PEN_INJECTOR | SUBCUTANEOUS | 12 refills | Status: AC

## 2023-10-22 MED ORDER — METFORMIN HCL 1000 MG PO TABS
1000.0000 mg | ORAL_TABLET | Freq: Two times a day (BID) | ORAL | 1 refills | Status: AC
Start: 2023-10-22 — End: ?

## 2023-10-22 NOTE — Patient Instructions (Addendum)
  VISIT SUMMARY: Today, you had a follow-up appointment to review your chronic medical conditions, including diabetes, hypertension, and hyperlipidemia. We discussed your current medications, recent blood sugar levels, and challenges with diet and weight loss. We also reviewed your history of pancreatitis, chronic left foot pain, and recent cessation of alcohol use.  YOUR PLAN: -TYPE 2 DIABETES MELLITUS, POORLY CONTROLLED: Your diabetes is not well controlled, with an A1c of 11.8% and blood sugar levels ranging from 200 to 347 mg/dL. We have adjusted your insulin  doses: Basaglar  to 28 units daily and Novolog  to 8 units with meals. Continue taking metformin  1000 mg twice daily. You are encouraged to use your continuous glucose monitor and follow dietary advice to reduce sugary snacks and switch to whole grain bread. We are referring you to an endocrinologist to evaluate the safety of using GLP-1 receptor agonists due to your history of pancreatitis.  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 We discussed the risks associated with GLP-1 receptor agonists, and you will be referred to an endocrinologist for further evaluation.  -HYPERTENSION:  We aim to keep your blood pressure below 130/80 mmHg. Your lisinopril  dose has been increased to 20 mg daily. Please continue to monitor your blood pressure at home and follow a low-salt diet.  -HYPERLIPIDEMIA: You previously stopped atorvastatin  due to muscle cramps. We will start you on rosuvastatin 5 mg three times a week to see if you tolerate it better. A cholesterol blood test has been ordered to monitor your levels.  -CHRONIC LEFT FOOT PAIN, LIKELY INFLAMMATORY: You have chronic left foot pain, likely due to inflammation, which has persisted for 10 years. Lyrica  has not been effective, so we may consider increasing the dosage. You can take meloxicam  daily or ibuprofen  as needed with food. We will also perform a foot  examination.  -ALCOHOL USE, IN REMISSION: You have not consumed alcohol since April 2025, and your alcohol use is in remission.  -GENERAL HEALTH MAINTENANCE: We discussed the need for routine health maintenance, including scheduling a Pap smear and ensuring you have had a recent eye examination.  INSTRUCTIONS: Please follow up with the endocrinologist for further evaluation of your diabetes management and the potential use of GLP-1 receptor agonists. Schedule a Pap smear as discussed. Continue to monitor your blood pressure at home and follow the dietary recommendations provided. A cholesterol blood test has been ordered, so please complete that as instructed.                      Contains text generated by Abridge.                                 Contains text generated by Abridge.
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
## 2023-10-24 ENCOUNTER — Ambulatory Visit: Payer: Self-pay | Admitting: Internal Medicine

## 2023-10-24 ENCOUNTER — Other Ambulatory Visit: Payer: Self-pay | Admitting: Internal Medicine

## 2023-10-24 DIAGNOSIS — I152 Hypertension secondary to endocrine disorders: Secondary | ICD-10-CM

## 2023-10-24 LAB — LIPID PANEL
Chol/HDL Ratio: 3.9 ratio (ref 0.0–4.4)
Cholesterol, Total: 182 mg/dL (ref 100–199)
HDL: 47 mg/dL (ref >39)
LDL Chol Calc (NIH): 115 mg/dL — ABNORMAL HIGH (ref 0–99)
Triglycerides: 112 mg/dL (ref 0–149)
VLDL Cholesterol Cal: 20 mg/dL (ref 5–40)

## 2023-10-24 LAB — COMPREHENSIVE METABOLIC PANEL WITH GFR
ALT: 11 IU/L (ref 0–32)
AST: 10 IU/L (ref 0–40)
Albumin: 4.2 g/dL (ref 3.9–4.9)
Alkaline Phosphatase: 76 IU/L (ref 41–116)
BUN/Creatinine Ratio: 17 (ref 9–23)
BUN: 16 mg/dL (ref 6–20)
Bilirubin Total: 0.3 mg/dL (ref 0.0–1.2)
CO2: 23 mmol/L (ref 20–29)
Calcium: 10.1 mg/dL (ref 8.7–10.2)
Chloride: 96 mmol/L (ref 96–106)
Creatinine, Ser: 0.95 mg/dL (ref 0.57–1.00)
Globulin, Total: 2.8 g/dL (ref 1.5–4.5)
Glucose: 83 mg/dL (ref 70–99)
Potassium: 4.5 mmol/L (ref 3.5–5.2)
Sodium: 135 mmol/L (ref 134–144)
Total Protein: 7 g/dL (ref 6.0–8.5)
eGFR: 80 mL/min/1.73 (ref >59)

## 2023-10-24 LAB — MICROALBUMIN / CREATININE URINE RATIO
Creatinine, Urine: 72.3 mg/dL
Microalb/Creat Ratio: 5 mg/g{creat} (ref 0–29)
Microalbumin, Urine: 3.6 ug/mL

## 2023-11-06 ENCOUNTER — Other Ambulatory Visit: Payer: Self-pay | Admitting: Internal Medicine

## 2023-11-06 DIAGNOSIS — M792 Neuralgia and neuritis, unspecified: Secondary | ICD-10-CM

## 2023-11-20 ENCOUNTER — Ambulatory Visit: Attending: Internal Medicine | Admitting: Internal Medicine

## 2023-12-18 ENCOUNTER — Ambulatory Visit: Attending: Family Medicine | Admitting: Internal Medicine

## 2023-12-18 ENCOUNTER — Other Ambulatory Visit (HOSPITAL_COMMUNITY)
Admission: RE | Admit: 2023-12-18 | Discharge: 2023-12-18 | Disposition: A | Discharge status: Home / Self Care | Source: Ambulatory Visit | Attending: Family Medicine | Admitting: Family Medicine

## 2023-12-18 VITALS — BP 119/84 | HR 84 | Temp 98.4°F | Ht 67.0 in | Wt 228.0 lb

## 2023-12-18 DIAGNOSIS — Z0001 Encounter for general adult medical examination with abnormal findings: Secondary | ICD-10-CM | POA: Diagnosis not present

## 2023-12-18 DIAGNOSIS — Z23 Encounter for immunization: Secondary | ICD-10-CM | POA: Diagnosis not present

## 2023-12-18 DIAGNOSIS — Z124 Encounter for screening for malignant neoplasm of cervix: Secondary | ICD-10-CM

## 2023-12-18 DIAGNOSIS — G8929 Other chronic pain: Secondary | ICD-10-CM

## 2023-12-18 DIAGNOSIS — Z6835 Body mass index (BMI) 35.0-35.9, adult: Secondary | ICD-10-CM

## 2023-12-18 DIAGNOSIS — M545 Low back pain, unspecified: Secondary | ICD-10-CM | POA: Diagnosis not present

## 2023-12-18 DIAGNOSIS — E66812 Obesity, class 2: Secondary | ICD-10-CM

## 2023-12-18 DIAGNOSIS — N926 Irregular menstruation, unspecified: Secondary | ICD-10-CM

## 2023-12-18 DIAGNOSIS — Z0000 Encounter for general adult medical examination without abnormal findings: Secondary | ICD-10-CM
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
## 2023-12-18 NOTE — Patient Instructions (Addendum)
 [facility]  Ph# [phone]  [address] [place], [place] [address]   VISIT SUMMARY: Today, you had your annual physical exam and Pap smear. We discussed your recent irregular menstrual cycles, back pain, and overall health habits. We also reviewed your last Pap smear results and administered the first dose of the HPV vaccine. You received advice on exercise, healthy snacking, and were referred to an endocrinologist for diabetes management.  YOUR PLAN: -WOMAN'S WELLNESS VISIT: This visit focused on maintaining your overall health, screening for cancer, and updating vaccinations. Your last Pap smear was HPV positive but negative for high-risk subtypes. Today, we performed another Pap smear and gave you the first dose of the HPV vaccine. You were encouraged to exercise 3-5 days a week for 30 minutes and advised to choose healthy snacks like fruits and unsalted nuts.  -LOW BACK PAIN WITH RIGHT-SIDED SCIATICA: Your chronic low back pain, which sometimes radiates to your right hip and buttock, is likely due to strain from your work. This condition is known as sciatica. You were referred to physical therapy for pain management and advised to modify your work activities to reduce heavy lifting. An x-ray of your back was offered if you wish to have one.  INSTRUCTIONS: Please follow up with the endocrinologist for diabetes management as discussed. Continue with the physical therapy sessions for your back pain and make the recommended work modifications. If you experience any worsening of symptoms or have any concerns, please schedule a follow-up appointment.                      Contains text generated by [employer]bridge.                                 Contains text generated by [employer]bridge.

## 2023-12-19 ENCOUNTER — Encounter: Payer: Self-pay | Admitting: Internal Medicine

## 2023-12-22 LAB — CYTOLOGY - PAP
Adequacy: ABSENT
Comment: NEGATIVE
Comment: NEGATIVE
Comment: NEGATIVE
Diagnosis: NEGATIVE
HPV 16: NEGATIVE
HPV 18 / 45: NEGATIVE
High risk HPV: POSITIVE — AB

## 2023-12-23 ENCOUNTER — Ambulatory Visit: Payer: Self-pay | Admitting: Internal Medicine

## 2023-12-23 ENCOUNTER — Other Ambulatory Visit: Payer: Self-pay | Admitting: Internal Medicine

## 2023-12-23 DIAGNOSIS — R87618 Other abnormal cytological findings on specimens from cervix uteri: Secondary | ICD-10-CM

## 2024-01-03 ENCOUNTER — Observation Stay (HOSPITAL_COMMUNITY)
Admission: EM | Admit: 2024-01-03 | Discharge: 2024-01-05 | Disposition: A | Discharge status: Home / Self Care | Attending: Internal Medicine | Admitting: Internal Medicine

## 2024-01-03 ENCOUNTER — Other Ambulatory Visit: Payer: Self-pay

## 2024-01-03 ENCOUNTER — Encounter (HOSPITAL_COMMUNITY): Payer: Self-pay

## 2024-01-03 DIAGNOSIS — Z79899 Other long term (current) drug therapy: Secondary | ICD-10-CM | POA: Insufficient documentation

## 2024-01-03 DIAGNOSIS — Z7982 Long term (current) use of aspirin: Secondary | ICD-10-CM | POA: Insufficient documentation

## 2024-01-03 DIAGNOSIS — Z87891 Personal history of nicotine dependence: Secondary | ICD-10-CM | POA: Diagnosis not present

## 2024-01-03 DIAGNOSIS — F10939 Alcohol use, unspecified with withdrawal, unspecified: Secondary | ICD-10-CM | POA: Diagnosis present

## 2024-01-03 DIAGNOSIS — I10 Essential (primary) hypertension: Secondary | ICD-10-CM | POA: Diagnosis not present

## 2024-01-03 DIAGNOSIS — Z794 Long term (current) use of insulin: Secondary | ICD-10-CM | POA: Diagnosis not present

## 2024-01-03 DIAGNOSIS — G629 Polyneuropathy, unspecified: Secondary | ICD-10-CM

## 2024-01-03 DIAGNOSIS — F10239 Alcohol dependence with withdrawal, unspecified: Secondary | ICD-10-CM | POA: Insufficient documentation

## 2024-01-03 DIAGNOSIS — E119 Type 2 diabetes mellitus without complications: Secondary | ICD-10-CM

## 2024-01-03 DIAGNOSIS — N179 Acute kidney failure, unspecified: Secondary | ICD-10-CM | POA: Diagnosis not present

## 2024-01-03 DIAGNOSIS — E1140 Type 2 diabetes mellitus with diabetic neuropathy, unspecified: Secondary | ICD-10-CM | POA: Diagnosis not present

## 2024-01-03 DIAGNOSIS — R1013 Epigastric pain: Secondary | ICD-10-CM

## 2024-01-03 DIAGNOSIS — E871 Hypo-osmolality and hyponatremia: Secondary | ICD-10-CM | POA: Insufficient documentation

## 2024-01-03 DIAGNOSIS — F10930 Alcohol use, unspecified with withdrawal, uncomplicated: Secondary | ICD-10-CM | POA: Diagnosis not present

## 2024-01-03 DIAGNOSIS — R112 Nausea with vomiting, unspecified: Secondary | ICD-10-CM | POA: Diagnosis not present

## 2024-01-03 DIAGNOSIS — E1165 Type 2 diabetes mellitus with hyperglycemia: Secondary | ICD-10-CM | POA: Insufficient documentation

## 2024-01-03 DIAGNOSIS — E1110 Type 2 diabetes mellitus with ketoacidosis without coma: Principal | ICD-10-CM | POA: Insufficient documentation

## 2024-01-03 DIAGNOSIS — E8729 Other acidosis: Principal | ICD-10-CM | POA: Diagnosis present

## 2024-01-03 DIAGNOSIS — Z7984 Long term (current) use of oral hypoglycemic drugs: Secondary | ICD-10-CM | POA: Diagnosis not present

## 2024-01-03 DIAGNOSIS — K219 Gastro-esophageal reflux disease without esophagitis: Secondary | ICD-10-CM | POA: Diagnosis not present

## 2024-01-03 LAB — BLOOD GAS, VENOUS
Acid-base deficit: 7.9 mmol/L — ABNORMAL HIGH (ref 0.0–2.0)
Bicarbonate: 18.3 mmol/L — ABNORMAL LOW (ref 20.0–28.0)
O2 Saturation: 44 %
Patient temperature: 37
pCO2, Ven: 39 mmHg — ABNORMAL LOW (ref 44–60)
pH, Ven: 7.28 (ref 7.25–7.43)
pO2, Ven: 31 mmHg — CL (ref 32–45)

## 2024-01-03 LAB — URINALYSIS, ROUTINE W REFLEX MICROSCOPIC
Bacteria, UA: NONE SEEN
Bilirubin Urine: NEGATIVE
Glucose, UA: >=500 mg/dL — AB
Ketones, ur: 80 mg/dL — AB
Leukocytes,Ua: NEGATIVE
Nitrite: NEGATIVE
Protein, ur: 100 mg/dL — AB
Specific Gravity, Urine: 1.01 (ref 1.005–1.030)
pH: 5 (ref 5.0–8.0)

## 2024-01-03 LAB — CBC WITH DIFFERENTIAL/PLATELET
Abs Immature Granulocytes: 0.06 K/uL (ref 0.00–0.07)
Basophils Absolute: 0.1 K/uL (ref 0.0–0.1)
Basophils Relative: 1 %
Eosinophils Absolute: 0 K/uL (ref 0.0–0.5)
Eosinophils Relative: 0 %
HCT: 41.2 % (ref 36.0–46.0)
Hemoglobin: 13.6 g/dL (ref 12.0–15.0)
Immature Granulocytes: 1 %
Lymphocytes Relative: 8 %
Lymphs Abs: 0.9 K/uL (ref 0.7–4.0)
MCH: 27.4 pg (ref 26.0–34.0)
MCHC: 33 g/dL (ref 30.0–36.0)
MCV: 83.1 fL (ref 80.0–100.0)
Monocytes Absolute: 0.6 K/uL (ref 0.1–1.0)
Monocytes Relative: 5 %
Neutro Abs: 10 K/uL — ABNORMAL HIGH (ref 1.7–7.7)
Neutrophils Relative %: 85 %
Platelets: 590 K/uL — ABNORMAL HIGH (ref 150–400)
RBC: 4.96 MIL/uL (ref 3.87–5.11)
RDW: 13.8 % (ref 11.5–15.5)
WBC: 11.7 K/uL — ABNORMAL HIGH (ref 4.0–10.5)
nRBC: 0 % (ref 0.0–0.2)

## 2024-01-03 LAB — CBG MONITORING, ED
Glucose-Capillary: 364 mg/dL — ABNORMAL HIGH (ref 70–99)
Glucose-Capillary: 266 mg/dL — ABNORMAL HIGH (ref 70–99)

## 2024-01-03 LAB — CREATININE, URINE, RANDOM: Creatinine, Urine: 29 mg/dL

## 2024-01-03 LAB — LIPASE, BLOOD: Lipase: 12 U/L (ref 11–51)

## 2024-01-03 LAB — COMPREHENSIVE METABOLIC PANEL WITH GFR
ALT: 54 U/L — ABNORMAL HIGH (ref 0–44)
AST: 56 U/L — ABNORMAL HIGH (ref 15–41)
Albumin: 4.7 g/dL (ref 3.5–5.0)
Alkaline Phosphatase: 116 U/L (ref 38–126)
Anion gap: 37 — ABNORMAL HIGH (ref 5–15)
BUN: 24 mg/dL — ABNORMAL HIGH (ref 6–20)
CO2: 16 mmol/L — ABNORMAL LOW (ref 22–32)
Calcium: 9.5 mg/dL (ref 8.9–10.3)
Chloride: 78 mmol/L — ABNORMAL LOW (ref 98–111)
Creatinine, Ser: 1.4 mg/dL — ABNORMAL HIGH (ref 0.44–1.00)
GFR, Estimated: 50 mL/min — ABNORMAL LOW
Glucose, Bld: 342 mg/dL — ABNORMAL HIGH (ref 70–99)
Potassium: 5 mmol/L (ref 3.5–5.1)
Sodium: 131 mmol/L — ABNORMAL LOW (ref 135–145)
Total Bilirubin: 1.4 mg/dL — ABNORMAL HIGH (ref 0.0–1.2)
Total Protein: 9 g/dL — ABNORMAL HIGH (ref 6.5–8.1)

## 2024-01-03 LAB — GLUCOSE, CAPILLARY: Glucose-Capillary: 219 mg/dL — ABNORMAL HIGH (ref 70–99)

## 2024-01-03 LAB — HCG, SERUM, QUALITATIVE: Preg, Serum: NEGATIVE

## 2024-01-03 LAB — SODIUM, URINE, RANDOM: Sodium, Ur: 105 mmol/L

## 2024-01-03 MED ORDER — INSULIN ASPART 100 UNIT/ML IJ SOLN: 0.0000 [IU] | Freq: Every day | INTRAMUSCULAR | Status: DC

## 2024-01-03 MED ORDER — LACTATED RINGERS IV BOLUS: 20.0000 mL/kg | Freq: Once | INTRAVENOUS | Status: DC

## 2024-01-03 MED ORDER — LACTATED RINGERS IV SOLN: INTRAVENOUS | Status: DC

## 2024-01-03 MED ORDER — THIAMINE HCL 100 MG/ML IJ SOLN
100.0000 mg | Freq: Once | INTRAMUSCULAR | Status: AC
  Administered 2024-01-03: 100 mg via INTRAVENOUS
  Filled 2024-01-03: qty 2

## 2024-01-03 MED ORDER — INSULIN ASPART 100 UNIT/ML IJ SOLN
0.0000 [IU] | INTRAMUSCULAR | Status: DC
  Administered 2024-01-03: 3 [IU] via SUBCUTANEOUS
  Administered 2024-01-04: 2 [IU] via SUBCUTANEOUS
  Administered 2024-01-04: 3 [IU] via SUBCUTANEOUS
  Administered 2024-01-04: 2 [IU] via SUBCUTANEOUS
  Filled 2024-01-03 (×2): qty 2
  Filled 2024-01-03: qty 3
  Filled 2024-01-03: qty 6

## 2024-01-03 MED ORDER — DEXTROSE-SODIUM CHLORIDE 5-0.9 % IV SOLN: INTRAVENOUS | Status: DC

## 2024-01-03 MED ORDER — ALUM & MAG HYDROXIDE-SIMETH 200-200-20 MG/5ML PO SUSP
30.0000 mL | Freq: Once | ORAL | Status: AC
  Administered 2024-01-03: 30 mL via ORAL
  Filled 2024-01-03: qty 30

## 2024-01-03 MED ORDER — SODIUM CHLORIDE 0.9 % IV SOLN: 250.0000 mL | INTRAVENOUS | Status: AC | PRN

## 2024-01-03 MED ORDER — ONDANSETRON HCL 4 MG PO TABS: 4.0000 mg | ORAL_TABLET | Freq: Four times a day (QID) | ORAL | Status: DC | PRN

## 2024-01-03 MED ORDER — THIAMINE HCL 100 MG/ML IJ SOLN: 100.0000 mg | Freq: Every day | INTRAMUSCULAR | Status: DC

## 2024-01-03 MED ORDER — ADULT MULTIVITAMIN W/MINERALS CH
1.0000 | ORAL_TABLET | Freq: Every day | ORAL | Status: DC
  Administered 2024-01-03 – 2024-01-05 (×3): 1 via ORAL
  Filled 2024-01-03 (×3): qty 1

## 2024-01-03 MED ORDER — ACETAMINOPHEN 325 MG PO TABS
650.0000 mg | ORAL_TABLET | Freq: Four times a day (QID) | ORAL | Status: DC | PRN
  Administered 2024-01-04 – 2024-01-05 (×3): 650 mg via ORAL
  Filled 2024-01-03 (×3): qty 2

## 2024-01-03 MED ORDER — SODIUM CHLORIDE 0.9% FLUSH
3.0000 mL | Freq: Two times a day (BID) | INTRAVENOUS | Status: DC
  Administered 2024-01-03 – 2024-01-05 (×3): 3 mL via INTRAVENOUS

## 2024-01-03 MED ORDER — INSULIN GLARGINE 100 UNIT/ML ~~LOC~~ SOLN
22.0000 [IU] | Freq: Every day | SUBCUTANEOUS | Status: DC
  Administered 2024-01-04 – 2024-01-05 (×2): 22 [IU] via SUBCUTANEOUS
  Filled 2024-01-03 (×2): qty 0.22

## 2024-01-03 MED ORDER — ENOXAPARIN SODIUM 60 MG/0.6ML IJ SOSY
50.0000 mg | PREFILLED_SYRINGE | INTRAMUSCULAR | Status: DC
  Administered 2024-01-04 – 2024-01-05 (×2): 50 mg via SUBCUTANEOUS
  Filled 2024-01-03 (×2): qty 0.6

## 2024-01-03 MED ORDER — INSULIN REGULAR(HUMAN) IN NACL 100-0.9 UT/100ML-% IV SOLN: INTRAVENOUS | Status: DC

## 2024-01-03 MED ORDER — ACETAMINOPHEN 650 MG RE SUPP: 650.0000 mg | Freq: Four times a day (QID) | RECTAL | Status: DC | PRN

## 2024-01-03 MED ORDER — ONDANSETRON HCL 4 MG/2ML IJ SOLN
4.0000 mg | Freq: Four times a day (QID) | INTRAMUSCULAR | Status: DC | PRN
  Administered 2024-01-04: 4 mg via INTRAVENOUS
  Filled 2024-01-03: qty 2

## 2024-01-03 MED ORDER — PREGABALIN 50 MG PO CAPS
50.0000 mg | ORAL_CAPSULE | Freq: Two times a day (BID) | ORAL | Status: DC
  Administered 2024-01-03 – 2024-01-05 (×4): 50 mg via ORAL
  Filled 2024-01-03 (×4): qty 1

## 2024-01-03 MED ORDER — PANTOPRAZOLE SODIUM 40 MG PO TBEC
40.0000 mg | DELAYED_RELEASE_TABLET | Freq: Every day | ORAL | Status: DC
  Administered 2024-01-03 – 2024-01-05 (×3): 40 mg via ORAL
  Filled 2024-01-03 (×3): qty 1

## 2024-01-03 MED ORDER — SODIUM CHLORIDE 0.9% FLUSH: 3.0000 mL | INTRAVENOUS | Status: DC | PRN

## 2024-01-03 MED ORDER — DEXTROSE IN LACTATED RINGERS 5 % IV SOLN: INTRAVENOUS | Status: DC

## 2024-01-03 MED ORDER — INSULIN ASPART 100 UNIT/ML IJ SOLN: 0.0000 [IU] | Freq: Four times a day (QID) | INTRAMUSCULAR | Status: DC

## 2024-01-03 MED ORDER — ONDANSETRON HCL 4 MG/2ML IJ SOLN
4.0000 mg | Freq: Once | INTRAMUSCULAR | Status: AC
  Administered 2024-01-03: 4 mg via INTRAVENOUS
  Filled 2024-01-03: qty 2

## 2024-01-03 MED ORDER — LORAZEPAM 1 MG PO TABS
1.0000 mg | ORAL_TABLET | ORAL | Status: DC | PRN
  Administered 2024-01-04: 2 mg via ORAL
  Filled 2024-01-03: qty 2

## 2024-01-03 MED ORDER — LORAZEPAM 2 MG/ML IJ SOLN
1.0000 mg | INTRAMUSCULAR | Status: DC | PRN
  Administered 2024-01-03: 2 mg via INTRAVENOUS
  Filled 2024-01-03: qty 1

## 2024-01-03 MED ORDER — FOLIC ACID 1 MG PO TABS
1.0000 mg | ORAL_TABLET | Freq: Every day | ORAL | Status: DC
  Administered 2024-01-03 – 2024-01-05 (×3): 1 mg via ORAL
  Filled 2024-01-03 (×3): qty 1

## 2024-01-03 MED ORDER — LACTATED RINGERS IV BOLUS
1000.0000 mL | Freq: Once | INTRAVENOUS | Status: AC
  Administered 2024-01-03: 1000 mL via INTRAVENOUS

## 2024-01-03 MED ORDER — DEXTROSE 50 % IV SOLN: 0.0000 mL | INTRAVENOUS | Status: DC | PRN

## 2024-01-03 MED ORDER — ALUM & MAG HYDROXIDE-SIMETH 200-200-20 MG/5ML PO SUSP
30.0000 mL | Freq: Four times a day (QID) | ORAL | Status: DC | PRN
  Administered 2024-01-04: 30 mL via ORAL
  Filled 2024-01-03 (×2): qty 30

## 2024-01-03 MED ORDER — PROCHLORPERAZINE EDISYLATE 10 MG/2ML IJ SOLN
5.0000 mg | Freq: Once | INTRAMUSCULAR | Status: AC
  Administered 2024-01-03: 5 mg via INTRAVENOUS
  Filled 2024-01-03: qty 2

## 2024-01-03 MED ORDER — THIAMINE MONONITRATE 100 MG PO TABS
100.0000 mg | ORAL_TABLET | Freq: Every day | ORAL | Status: DC
  Administered 2024-01-04 – 2024-01-05 (×2): 100 mg via ORAL
  Filled 2024-01-03 (×2): qty 1

## 2024-01-03 MED ORDER — SODIUM CHLORIDE 0.9 % IV BOLUS
1000.0000 mL | Freq: Once | INTRAVENOUS | Status: AC
  Administered 2024-01-03: 1000 mL via INTRAVENOUS

## 2024-01-03 MED ORDER — BASAGLAR KWIKPEN 100 UNIT/ML ~~LOC~~ SOPN: 28.0000 [IU] | PEN_INJECTOR | Freq: Every day | SUBCUTANEOUS | Status: DC

## 2024-01-03 MED ORDER — POTASSIUM CHLORIDE 10 MEQ/100ML IV SOLN
10.0000 meq | INTRAVENOUS | Status: DC
  Filled 2024-01-03: qty 100

## 2024-01-03 MED ORDER — BASAGLAR KWIKPEN 100 UNIT/ML ~~LOC~~ SOPN: 22.0000 [IU] | PEN_INJECTOR | Freq: Every day | SUBCUTANEOUS | Status: DC

## 2024-01-03 NOTE — ED Notes (Signed)
 Dr. Lee to bedside to discuss plan of care.  PT ambulated to and from restroom standby assist, noted steady gait.  PT back to bed and reconnected to continuous cardiac, pulse ox and bp monitoring.  Ativan  administered based off CIWA score and protocol.  Pt made aware has admitting bed assigned, but currently in cleaning status, will notify when completed. PT verbalized understanding.  No respiratory distress noted. PT states shortness of breath on exertion and activity better than PTA

## 2024-01-03 NOTE — ED Notes (Signed)
 Called lab to notify sending VBG down right now per order, tube station down for lab, being hand delivered by [facility], NT

## 2024-01-03 NOTE — ED Notes (Signed)
 First poc with patient. PT axox4. GCS 15. C/O epigastric pain 7/10. PT connected to continuous bp and o2 monitoring.  During initial RN assessment  pt also vomited, notified MD via secure chat.  VS obtained and recorded. No respiratory distress noted.

## 2024-01-03 NOTE — ED Notes (Signed)
 ED TO INPATIENT HANDOFF REPORT  Name/Age/Gender Yolanda Flores 38 y.o. female  Code Status    Code Status Orders  (From admission, onward)           Start     Ordered   01/03/24 2146  Full code  Continuous       Question:  By:  Answer:  Consent: discussion documented in EHR   01/03/24 2145           Code Status History     Date Active Date Inactive Code Status Order ID Comments User Context   04/28/2023 2101 04/30/2023 1840 Full Code [id number]  Silvester Ales, MD ED   01/18/2023 0403 01/20/2023 1713 Full Code [id number]  Alfornia Madison, MD Inpatient   12/12/2022 1427 12/13/2022 1739 Full Code [id number]  Celinda Alm Lot, MD ED   05/15/2022 2355 05/17/2022 1742 Full Code [id number]  Leopold Damien [name], MD ED   03/01/2022 0035 03/03/2022 1554 Full Code [id number]  Tobie Jorie JONELLE, MD ED   01/04/2022 0030 01/05/2022 1757 Full Code [id number]  Debby Camila LABOR, MD ED   12/04/2021 2327 12/07/2021 2004 Full Code [id number]  Silvester Ales, MD Inpatient   12/24/2019 1645 12/28/2019 1819 Full Code [id number]  Gretta Leita SQUIBB, DO ED   01/04/2019 1736 01/18/2019 2226 Full Code [id number]  Sherrill Alejandro Donovan, DO ED   05/24/2018 2050 05/27/2018 1854 Full Code [id number]  Hilma Rankins, MD ED       Home/SNF/Other Home  Chief Complaint Alcoholic ketoacidosis [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.29]  Level of Care/Admitting Diagnosis ED Disposition     ED Disposition  Admit   Condition  --   Comment  Hospital Area: [facility] [[id number]]  Level of Care: Telemetry [5]  Admit to tele based on following criteria: Other see comments  Comments: Monitor for arrhythmia  May place patient in observation at [facility] or [facility] if equivalent level of care is available:: No  Diagnosis: Alcoholic ketoacidosis [172225]  Admitting Physician: SUNDIL, SUBRINA [[id number]]  Attending Physician: SUNDIL, SUBRINA [[id number]]          Medical History Past Medical History:  Diagnosis Date   AKI (acute  kidney injury)    Diabetes mellitus (HCC)    HLD (hyperlipidemia)    Hypertension    Pancreatitis     Allergies Allergies[1]  IV Location/Drains/Wounds Patient Lines/Drains/Airways Status     Active Line/Drains/Airways     Name Placement date Placement time Site Days   Peripheral IV 01/03/24 20 G 1 Right Antecubital 01/03/24  1829  Antecubital  less than 1   Peripheral IV 01/03/24 Left Antecubital 01/03/24  1729  Antecubital  less than 1            Labs/Imaging Results for orders placed or performed during the hospital encounter of 01/03/24 (from the past 48 hours)  POC CBG, ED     Status: Abnormal   Collection Time: 01/03/24  6:25 PM  Result Value Ref Range   Glucose-Capillary 364 (H) 70 - 99 mg/dL    Comment: Glucose reference range applies only to samples taken after fasting for at least 8 hours.  Comprehensive metabolic panel     Status: Abnormal   Collection Time: 01/03/24  6:45 PM  Result Value Ref Range   Sodium 131 (L) 135 - 145 mmol/L    Comment: Electrolytes repeated to verify    Potassium 5.0 3.5 - 5.1 mmol/L   Chloride 78 (L) 98 - 111 mmol/L   CO2 16 (L)  22 - 32 mmol/L   Glucose, Bld 342 (H) 70 - 99 mg/dL    Comment: Glucose reference range applies only to samples taken after fasting for at least 8 hours.   BUN 24 (H) 6 - 20 mg/dL   Creatinine, Ser 8.59 (H) 0.44 - 1.00 mg/dL   Calcium  9.5 8.9 - 10.3 mg/dL   Total Protein 9.0 (H) 6.5 - 8.1 g/dL   Albumin  4.7 3.5 - 5.0 g/dL   AST 56 (H) 15 - 41 U/L   ALT 54 (H) 0 - 44 U/L   Alkaline Phosphatase 116 38 - 126 U/L   Total Bilirubin 1.4 (H) 0.0 - 1.2 mg/dL   GFR, Estimated 50 (L) >60 mL/min    Comment: (NOTE) Calculated using the CKD-EPI Creatinine Equation (2021)    Anion gap 37 (H) 5 - 15    Comment: Performed at [facility], [address] [place], [place] [address]  Lipase, blood     Status: None   Collection Time: 01/03/24  6:45 PM  Result Value Ref Range   Lipase 12 11 - 51  U/L    Comment: Performed at [facility], [address] [place], [place] [address]  CBC with Differential     Status: Abnormal   Collection Time: 01/03/24  6:45 PM  Result Value Ref Range   WBC 11.7 (H) 4.0 - 10.5 K/uL   RBC 4.96 3.87 - 5.11 MIL/uL   Hemoglobin 13.6 12.0 - 15.0 g/dL   HCT 58.7 63.9 - 53.9 %   MCV 83.1 80.0 - 100.0 fL   MCH 27.4 26.0 - 34.0 pg   MCHC 33.0 30.0 - 36.0 g/dL   RDW 86.1 88.4 - 84.4 %   Platelets 590 (H) 150 - 400 K/uL   nRBC 0.0 0.0 - 0.2 %   Neutrophils Relative % 85 %   Neutro Abs 10.0 (H) 1.7 - 7.7 K/uL   Lymphocytes Relative 8 %   Lymphs Abs 0.9 0.7 - 4.0 K/uL   Monocytes Relative 5 %   Monocytes Absolute 0.6 0.1 - 1.0 K/uL   Eosinophils Relative 0 %   Eosinophils Absolute 0.0 0.0 - 0.5 K/uL   Basophils Relative 1 %   Basophils Absolute 0.1 0.0 - 0.1 K/uL   Immature Granulocytes 1 %   Abs Immature Granulocytes 0.06 0.00 - 0.07 K/uL    Comment: Performed at [facility], [address], [place], [place] [address]  hCG, serum, qualitative     Status: None   Collection Time: 01/03/24  6:45 PM  Result Value Ref Range   Preg, Serum NEGATIVE NEGATIVE    Comment:        THE SENSITIVITY OF THIS METHODOLOGY IS >10 mIU/mL. Performed at [facility], [address] [place], [place] [address]   Blood gas, venous     Status: Abnormal   Collection Time: 01/03/24  8:28 PM  Result Value Ref Range   pH, Ven 7.28 7.25 - 7.43   pCO2, Ven 39 (L) 44 - 60 mmHg   pO2, Ven 31 (LL) 32 - 45 mmHg    Comment: CRITICAL RESULT CALLED TO, READ BACK BY AND VERIFIED WITH: DELORES [name] RN @ 2105 01/03/24 CAL    Bicarbonate 18.3 (L) 20.0 - 28.0 mmol/L   Acid-base deficit 7.9 (H) 0.0 - 2.0 mmol/L   O2 Saturation 44 %   Patient temperature 37.0     Comment: Performed at [facility], [address], [place], [place] [address]  CBG monitoring, ED     Status: Abnormal   Collection Time: 01/03/24  10:07 PM  Result Value Ref Range   Glucose-Capillary 266 (H) 70 - 99 mg/dL    Comment: Glucose reference range applies only to samples taken after fasting for at least 8 hours.   No results found.  Pending Labs Unresulted Labs (From admission, onward)     Start     Ordered   01/04/24 0500  CBC  Tomorrow morning,   R        01/03/24 2146   01/04/24 0500  Beta-hydroxybutyric acid  Tomorrow morning,   R        01/03/24 2215   01/04/24 0500  Hepatic function panel  Tomorrow morning,   R        01/03/24 2215   01/04/24 0152  Basic metabolic panel  STAT Now then every 4 hours ,   R (with STAT occurrences)      01/03/24 2215   01/03/24 2216  Beta-hydroxybutyric acid  ONCE - URGENT,   URGENT        01/03/24 2215   01/03/24 2155  Creatinine, urine, random  Once,   R        01/03/24 2154   01/03/24 2155  Sodium, urine, random  Once,   R        01/03/24 2154   01/03/24 2154  Urinalysis, Routine w reflex microscopic -Urine, Clean Catch  Once,   R       Question Answer Comment  Specimen Source Urine, Clean Catch   Release to patient Immediate      01/03/24 2154   01/03/24 2147  HIV Antibody (routine testing w rflx)  (HIV Antibody (Routine testing w reflex) panel)  Add-on,   AD        01/03/24 2146            Vitals/Pain Today's Vitals   01/03/24 2100 01/03/24 2147 01/03/24 2212 01/03/24 2227  BP: (!) 126/93  132/81 (!) 131/92  Pulse:   (!) 129 (!) 108  Resp: (!) 23   20  Temp:    98.3 F (36.8 C)  TempSrc:    Oral  SpO2:  98%  98%  Weight:      Height:      PainSc:    9     Isolation Precautions No active isolations  Medications Medications  insulin  aspart (novoLOG ) injection 0-6 Units (3 Units Subcutaneous Given 01/03/24 2219)  LORazepam  (ATIVAN ) tablet 1-4 mg ( Oral See Alternative 01/03/24 2216)    Or  LORazepam  (ATIVAN ) injection 1-4 mg (2 mg Intravenous Given 01/03/24 2216)  thiamine  (VITAMIN B1) tablet 100 mg (has no administration in time range)    Or   thiamine  (VITAMIN B1) injection 100 mg (has no administration in time range)  folic acid  (FOLVITE ) tablet 1 mg (1 mg Oral Given 01/03/24 2215)  multivitamin with minerals tablet 1 tablet (1 tablet Oral Given 01/03/24 2215)  enoxaparin  (LOVENOX ) injection 50 mg (has no administration in time range)  sodium chloride  flush (NS) 0.9 % injection 3 mL (3 mLs Intravenous Given 01/03/24 2218)  sodium chloride  flush (NS) 0.9 % injection 3 mL (3 mLs Intravenous Given 01/03/24 2218)  sodium chloride  flush (NS) 0.9 % injection 3 mL (has no administration in time range)  0.9 %  sodium chloride  infusion (has no administration in time range)  acetaminophen  (TYLENOL ) tablet 650 mg (has no  administration in time range)    Or  acetaminophen  (TYLENOL ) suppository 650 mg (has no administration in time range)  ondansetron  (ZOFRAN ) tablet 4 mg (has no administration in time range)    Or  ondansetron  (ZOFRAN ) injection 4 mg (has no administration in time range)  dextrose  5 %-0.9 % sodium chloride  infusion (has no administration in time range)  Basaglar  KwikPen KwikPen 28 Units (has no administration in time range)  pantoprazole  (PROTONIX ) EC tablet 40 mg (has no administration in time range)  pregabalin  (LYRICA ) capsule 50 mg (has no administration in time range)  alum & mag hydroxide-simeth (MAALOX/MYLANTA) 200-200-20 MG/5ML suspension 30 mL (has no administration in time range)  ondansetron  (ZOFRAN ) injection 4 mg (4 mg Intravenous Given 01/03/24 1857)  sodium chloride  0.9 % bolus 1,000 mL (0 mLs Intravenous Stopped 01/03/24 2035)  alum & mag hydroxide-simeth (MAALOX/MYLANTA) 200-200-20 MG/5ML suspension 30 mL (30 mLs Oral Given 01/03/24 1919)  prochlorperazine  (COMPAZINE ) injection 5 mg (5 mg Intravenous Given 01/03/24 2020)  lactated ringers  bolus 1,000 mL (0 mLs Intravenous Stopped 01/03/24 2223)  thiamine  (VITAMIN B1) injection 100 mg (100 mg Intravenous Given 01/03/24 2022)    Mobility walks     [1]  No Known Allergies

## 2024-01-03 NOTE — ED Provider Notes (Signed)
 " [name] Provider Note   CSN: [id number] Arrival date & time: 01/03/24  1757     Patient presents with: Abdominal Pain (C/o epigastric pain since this am with n/v)   Yolanda Flores is a 38 y.o. female.    Abdominal Pain Patient presents with nausea vomiting and abdominal pain.  History of same.  History of pancreatitis but states this feels different.  Has not felt good over the last 3 days.  Has not taken her insulin  because she had not been eating.  Some shortness of breath.  No fevers.  History of alcoholic ketoacidosis.  States she drank heavily on her birthday which was 5 days ago.    Past Medical History:  Diagnosis Date   AKI (acute kidney injury)    Diabetes mellitus (HCC)    HLD (hyperlipidemia)    Hypertension    Pancreatitis     Prior to Admission medications  Medication Sig Start Date End Date Taking? Authorizing Provider  alum & mag hydroxide-simeth (MAALOX/MYLANTA) 200-200-20 MG/5ML suspension Take 30 mLs by mouth every 6 (six) hours as needed for indigestion or heartburn. 04/30/23   Cheryle Page, MD  aspirin -acetaminophen -caffeine  (EXCEDRIN  MIGRAINE) 250-250-65 MG tablet Take 2 tablets by mouth every 6 (six) hours as needed for headache.    [provider]  Continuous Glucose Sensor (FREESTYLE LIBRE 3 SENSOR) MISC Change Q 2 weeks 10/21/22   Vicci Barnie [name], MD  Insulin  Glargine (BASAGLAR  [facility]) 100 UNIT/ML Inject 28 Units into the skin daily. 10/22/23   Vicci Barnie [name], MD  insulin  lispro (HUMALOG  KWIKPEN) 200 UNIT/ML KwikPen 8 units TID subcut with meals 10/22/23   Vicci Barnie [name], MD  lisinopril  (ZESTRIL ) 20 MG tablet Take 1 tablet (20 mg total) by mouth daily. 10/22/23   Vicci Barnie [name], MD  metFORMIN  (GLUCOPHAGE ) 1000 MG tablet Take 1 tablet (1,000 mg total) by mouth 2 (two) times daily with a meal. 10/22/23   Vicci Barnie [name], MD  Multiple Vitamin (MULTIVITAMIN WITH MINERALS) TABS  tablet Take 1 tablet by mouth daily. 01/18/19   Akula, Vijaya, MD  omeprazole  (PRILOSEC ) 20 MG capsule TAKE 1 CAPSULE(20 MG) BY MOUTH DAILY 08/17/23   Newlin, Enobong, MD  ondansetron  (ZOFRAN ) 4 MG tablet Take 1 tablet (4 mg total) by mouth every 8 (eight) hours as needed for nausea or vomiting. 04/30/23   Cheryle Page, MD  pregabalin  (LYRICA ) 50 MG capsule TAKE 1 CAPSULE(50 MG) BY MOUTH TWICE DAILY 11/11/23   Vicci Barnie [name], MD  rosuvastatin  (CRESTOR ) 5 MG tablet 5 mg po Q M-W-F 10/22/23   Vicci Barnie [name], MD  thiamine  (VITAMIN B1) 100 MG tablet Take 1 tablet (100 mg total) by mouth daily. 01/20/23   Krishnan, Gokul, MD    Allergies: Patient has no known allergies.    Review of Systems  Gastrointestinal:  Positive for abdominal pain.    Updated Vital Signs BP (!) 126/93   Pulse (!) 127   Temp 99.4 F (37.4 C) (Oral)   Resp (!) 23   Ht 5' 7 (1.702 m)   Wt 103.4 kg   LMP 12/30/2023   SpO2 95%   BMI 35.71 kg/m   Physical Exam Vitals and nursing note reviewed.  Cardiovascular:     Rate and Rhythm: Tachycardia present.  Abdominal:     Tenderness: There is abdominal tenderness.     Comments: Mild upper abdominal tenderness without rebound or guarding.  No hernia palpated.  Neurological:  Mental Status: She is alert.     (all labs ordered are listed, but only abnormal results are displayed) Labs Reviewed  BLOOD GAS, VENOUS - Abnormal; Notable for the following components:      Result Value   pCO2, Ven 39 (*)    pO2, Ven 31 (*)    Bicarbonate 18.3 (*)    Acid-base deficit 7.9 (*)    All other components within normal limits  COMPREHENSIVE METABOLIC PANEL WITH GFR - Abnormal; Notable for the following components:   Sodium 131 (*)    Chloride 78 (*)    CO2 16 (*)    Glucose, Bld 342 (*)    BUN 24 (*)    Creatinine, Ser 1.40 (*)    Total Protein 9.0 (*)    AST 56 (*)    ALT 54 (*)    Total Bilirubin 1.4 (*)    GFR, Estimated 50 (*)    Anion gap 37 (*)    All  other components within normal limits  CBC WITH DIFFERENTIAL/PLATELET - Abnormal; Notable for the following components:   WBC 11.7 (*)    Platelets 590 (*)    Neutro Abs 10.0 (*)    All other components within normal limits  CBG MONITORING, ED - Abnormal; Notable for the following components:   Glucose-Capillary 364 (*)    All other components within normal limits  LIPASE, BLOOD  HCG, SERUM, QUALITATIVE    EKG: None  Radiology: No results found.   Procedures   Medications Ordered in the ED  dextrose  5 %-0.9 % sodium chloride  infusion (has no administration in time range)  ondansetron  (ZOFRAN ) injection 4 mg (4 mg Intravenous Given 01/03/24 1857)  sodium chloride  0.9 % bolus 1,000 mL (0 mLs Intravenous Stopped 01/03/24 2035)  alum & mag hydroxide-simeth (MAALOX/MYLANTA) 200-200-20 MG/5ML suspension 30 mL (30 mLs Oral Given 01/03/24 1919)  prochlorperazine  (COMPAZINE ) injection 5 mg (5 mg Intravenous Given 01/03/24 2020)  lactated ringers  bolus 1,000 mL (1,000 mLs Intravenous New Bag/Given 01/03/24 2023)  thiamine  (VITAMIN B1) injection 100 mg (100 mg Intravenous Given 01/03/24 2022)                                    Medical Decision Making Amount and/or Complexity of Data Reviewed Labs: ordered.  Risk OTC drugs. Prescription drug management.   Patient with nausea vomit abdominal pain.  Began after heavy drinking.  Differential diagnose includes DKA, pancreatitis, alcoholic ketoacidosis.  Blood work does show elevated sugar with anion gap.  Some electrolyte abnormalities also.  Boluses have been given.  However I think likely alcoholic ketoacidosis with heavy alcohol use.  Will give thiamine  and start dextrose  in the infusion.  Has required recurrent doses of antiemetics.  I think patient benefit from mission to the hospital.  Will discuss with hospitalist.   CRITICAL CARE Performed by: [place] Total critical care time: 30 minutes Critical care time was  exclusive of separately billable procedures and treating other patients. Critical care was necessary to treat or prevent imminent or life-threatening deterioration. Critical care was time spent personally by me on the following activities: development of treatment plan with patient and/or surrogate as well as nursing, discussions with consultants, evaluation of patient's response to treatment, examination of patient, obtaining history from patient or surrogate, ordering and performing treatments and interventions, ordering and review of laboratory studies, ordering and review of radiographic studies, pulse oximetry and  re-evaluation of patient's condition.      Final diagnoses:  Alcoholic ketoacidosis    ED Discharge Orders     None          Patsey Lot, MD 01/03/24 2124  "

## 2024-01-03 NOTE — ED Triage Notes (Addendum)
 Patient bib EMS C/o nausea/vomiting/reflux burning/loose stool and weakness since yesterday  Developed shobr today VS w/ EMS 118-136 hr, bp 138/86 CBG 361  20 left AC  NS going 4mg  zofran  given en route   Patient denies sick contacts Says it feels like acid reflux

## 2024-01-03 NOTE — ED Notes (Signed)
 Patient reports not taken her insulin  last Thursday, hasn't checked glucose in past few days either

## 2024-01-03 NOTE — H&P (Signed)
 " History and Physical    Yolanda Flores FMW:[id number] DOB: 1985/05/26 DOA: 01/03/2024  PCP: Vicci Barnie [name], MD   Patient coming from: Home   Chief Complaint:  Chief Complaint  Patient presents with   Abdominal Pain    C/o epigastric pain since this am with n/v   ED TRIAGE note:  Patient bib EMS C/o nausea/vomiting/reflux burning/loose stool and weakness since yesterday  Developed shobr today VS w/ EMS 118-136 hr, bp 138/86 CBG 361   20 left AC  NS going 4mg  zofran  given en route    Patient denies sick contacts Says it feels like acid reflux     HPI:  Yolanda Flores is a 38 y.o. female with medical history significant of chronic alcohol use disorder, cyclical vomiting syndrome, hyperthyroidism, hyperlipidemia, insulin -dependent DM type II and history of recurrent pancreatitis presented emergency department complaint of nausea, vomiting abdominal pain over the course of last 3 days.  Patient also reported given she has poor oral intake also not taking insulin  as well.  Patient denies abdominal pain currently, hematemesis, chest pain, palpitation, fever and chill.. Patient reported patient had birthday 5 days ago and started drinking heavily last drink was 2 days ago however patient is not also taking insulin  at the same time as she has poor oral intake and afraid of hypoglycemia.  ED Course:  At presentation to ED patient found tachycardic heart rate up to 127 otherwise hemodynamically stable. POC blood glucose 364. CMP showing low sodium 131, low chloride 78, low bicarb 16, elevated blood Leukos 342, elevated creatinine 1.4, elevated AST/ALT 56/54, elevated anion gap 37.  Normal lipase level. CBC showing leukocyte 11.7 and elevated platelet count 590. Pregnancy test negative. VBG normal pH and low bicarb 18. In the ED patient received IV thiamine  100 mg, 1 L of NS bolus and D5 -normal saline has been initiated.  Hospitalist consulted for further evaluation  management of alcoholic ketoacidosis, high anion gap metabolic acidosis, nausea and vomiting, hyperglycemia, alcohol withdrawal and acute kidney injury.     Significant labs in the ED: Lab Orders         Blood gas, venous         Comprehensive metabolic panel         Lipase, blood         CBC with Differential         hCG, serum, qualitative         CBC         HIV Antibody (routine testing w rflx)         Urinalysis, Routine w reflex microscopic -Urine, Clean Catch         Creatinine, urine, random         Sodium, urine, random         Beta-hydroxybutyric acid         Basic metabolic panel         Beta-hydroxybutyric acid         Hepatic function panel         POC CBG, ED       Review of Systems:  Review of Systems  Constitutional:  Negative for chills, fever, malaise/fatigue and weight loss.  Respiratory:  Negative for cough, sputum production and shortness of breath.   Cardiovascular:  Negative for chest pain and palpitations.  Gastrointestinal:  Positive for nausea and vomiting. Negative for abdominal pain, blood in stool, constipation, diarrhea, heartburn and melena.  Neurological:  Negative for dizziness, tremors,  seizures and headaches.  Psychiatric/Behavioral:  The patient is not nervous/anxious.     Past Medical History:  Diagnosis Date   AKI (acute kidney injury)    Diabetes mellitus (HCC)    HLD (hyperlipidemia)    Hypertension    Pancreatitis     Past Surgical History:  Procedure Laterality Date   FOOT SURGERY       reports that she quit smoking about 5 years ago. Her smoking use included cigarettes. She smoked an average of 4 packs per day. She has never used smokeless tobacco. She reports that she does not currently use alcohol. She reports that she does not use drugs.  Allergies[1]  Family History  Problem Relation Age of Onset   Hypertension Mother    Diabetes Mother    Hypertension Father    Hypertension Brother     Prior to Admission  medications  Medication Sig Start Date End Date Taking? Authorizing Provider  alum & mag hydroxide-simeth (MAALOX/MYLANTA) 200-200-20 MG/5ML suspension Take 30 mLs by mouth every 6 (six) hours as needed for indigestion or heartburn. 04/30/23   Cheryle Page, MD  aspirin -acetaminophen -caffeine  (EXCEDRIN  MIGRAINE) 250-250-65 MG tablet Take 2 tablets by mouth every 6 (six) hours as needed for headache.    [provider]  Continuous Glucose Sensor (FREESTYLE LIBRE 3 SENSOR) MISC Change Q 2 weeks 10/21/22   Vicci Barnie [name], MD  Insulin  Glargine (BASAGLAR  [facility]) 100 UNIT/ML Inject 28 Units into the skin daily. 10/22/23   Vicci Barnie [name], MD  insulin  lispro (HUMALOG  KWIKPEN) 200 UNIT/ML KwikPen 8 units TID subcut with meals 10/22/23   Vicci Barnie [name], MD  lisinopril  (ZESTRIL ) 20 MG tablet Take 1 tablet (20 mg total) by mouth daily. 10/22/23   Vicci Barnie [name], MD  metFORMIN  (GLUCOPHAGE ) 1000 MG tablet Take 1 tablet (1,000 mg total) by mouth 2 (two) times daily with a meal. 10/22/23   Vicci Barnie [name], MD  Multiple Vitamin (MULTIVITAMIN WITH MINERALS) TABS tablet Take 1 tablet by mouth daily. 01/18/19   Akula, Vijaya, MD  omeprazole  (PRILOSEC ) 20 MG capsule TAKE 1 CAPSULE(20 MG) BY MOUTH DAILY 08/17/23   Newlin, Enobong, MD  ondansetron  (ZOFRAN ) 4 MG tablet Take 1 tablet (4 mg total) by mouth every 8 (eight) hours as needed for nausea or vomiting. 04/30/23   Cheryle Page, MD  pregabalin  (LYRICA ) 50 MG capsule TAKE 1 CAPSULE(50 MG) BY MOUTH TWICE DAILY 11/11/23   Vicci Barnie [name], MD  rosuvastatin  (CRESTOR ) 5 MG tablet 5 mg po Q M-W-F 10/22/23   Vicci Barnie [name], MD  thiamine  (VITAMIN B1) 100 MG tablet Take 1 tablet (100 mg total) by mouth daily. 01/20/23   Verdene Purchase, MD     Physical Exam: Vitals:   01/03/24 1928 01/03/24 2030 01/03/24 2100 01/03/24 2212  BP: (!) 154/94 120/85 (!) 126/93 132/81  Pulse: (!) 121 (!) 127  (!) 129  Resp: 20 20 (!) 23   Temp: 99.4 F (37.4 C)      TempSrc: Oral     SpO2: 99% 95%    Weight:      Height:        Physical Exam Vitals and nursing note reviewed.  Constitutional:      General: She is not in acute distress.    Appearance: She is not ill-appearing.  HENT:     Mouth/Throat:     Mouth: Mucous membranes are moist.  Cardiovascular:     Rate and Rhythm: Tachycardia present.     Heart sounds:  Normal heart sounds.  Abdominal:     General: Abdomen is scaphoid. Bowel sounds are normal.     Palpations: Abdomen is soft.     Tenderness: There is no abdominal tenderness.  Skin:    Capillary Refill: Capillary refill takes less than 2 seconds.  Neurological:     Mental Status: She is alert and oriented to person, place, and time.  Psychiatric:        Mood and Affect: Mood normal. Mood is not anxious.      Labs on Admission: I have personally reviewed following labs and imaging studies  CBC: Recent Labs  Lab 01/03/24 1845  WBC 11.7*  NEUTROABS 10.0*  HGB 13.6  HCT 41.2  MCV 83.1  PLT 590*   Basic Metabolic Panel: Recent Labs  Lab 01/03/24 1845  NA 131*  K 5.0  CL 78*  CO2 16*  GLUCOSE 342*  BUN 24*  CREATININE 1.40*  CALCIUM  9.5   GFR: Estimated Creatinine Clearance: 68.7 mL/min (A) (by C-G formula based on SCr of 1.4 mg/dL (H)). Liver Function Tests: Recent Labs  Lab 01/03/24 1845  AST 56*  ALT 54*  ALKPHOS 116  BILITOT 1.4*  PROT 9.0*  ALBUMIN  4.7   Recent Labs  Lab 01/03/24 1845  LIPASE 12   No results for input(s): AMMONIA in the last 168 hours. Coagulation Profile: No results for input(s): INR, PROTIME in the last 168 hours. Cardiac Enzymes: No results for input(s): CKTOTAL, CKMB, CKMBINDEX, TROPONINI, TROPONINIHS in the last 168 hours. BNP (last 3 results) No results for input(s): BNP in the last 8760 hours. HbA1C: No results for input(s): HGBA1C in the last 72 hours. CBG: Recent Labs  Lab 01/03/24 1825  GLUCAP 364*   Lipid Profile: No results for  input(s): CHOL, HDL, LDLCALC, TRIG, CHOLHDL, LDLDIRECT in the last 72 hours. Thyroid Function Tests: No results for input(s): TSH, T4TOTAL, FREET4, T3FREE, THYROIDAB in the last 72 hours. Anemia Panel: No results for input(s): VITAMINB12, FOLATE, FERRITIN, TIBC, IRON, RETICCTPCT in the last 72 hours. Urine analysis:    Component Value Date/Time   COLORURINE STRAW (A) 04/28/2023 1610   APPEARANCEUR CLEAR 04/28/2023 1610   LABSPEC 1.012 04/28/2023 1610   PHURINE 5.0 04/28/2023 1610   GLUCOSEU >=500 (A) 04/28/2023 1610   HGBUR MODERATE (A) 04/28/2023 1610   BILIRUBINUR NEGATIVE 04/28/2023 1610   KETONESUR 80 (A) 04/28/2023 1610   PROTEINUR 100 (A) 04/28/2023 1610   UROBILINOGEN 2.0 (H) 12/31/2009 1757   NITRITE NEGATIVE 04/28/2023 1610   LEUKOCYTESUR NEGATIVE 04/28/2023 1610    Radiological Exams on Admission: I have personally reviewed images No results found.   EKG: My personal interpretation of EKG shows: Sinus tachycardia heart rate 118.    Assessment/Plan: Principal Problem:   Alcoholic ketoacidosis Active Problems:   Insulin  dependent type 2 diabetes mellitus (HCC)   AKI (acute kidney injury)   Nausea & vomiting   Alcohol withdrawal (HCC)   GERD (gastroesophageal reflux disease)   Peripheral neuropathy    Assessment and Plan: Alcoholic ketoacidosis Nausea and vomiting -Presented emergency department with complaining of nausea vomiting and poor oral intake.  Started drinking heavily 5 days ago during her birthday and for last 2 days have nausea and vomiting with associated poor oral intake.  Denies any hematemesis, fever, chill chest pain and palpitation - At presentation to ED patient found tachycardic otherwise hemodynamically stable - Further workup revealed elevated anion gap metabolic acidosis.  VBG unremarkable except low bicarb.  Normal pH.  Patient  is not currently in DKA or neither in HHS. - Lab work, POC blood glucose  364. CMP showing low sodium 131, low chloride 78, low bicarb 16, elevated blood Leukos 342, elevated creatinine 1.4, elevated AST/ALT 56/54, elevated anion gap 37.  Normal lipase level. CBC showing leukocyte 11.7 and elevated platelet count 590. Pregnancy test negative. VBG normal pH low bicarb 18. -In the ED patient received IV thiamine  100 mg, 1 L of NS bolus and D5 -normal saline has been initiated. -Continue D5- NS 100 cc/h which will facilitate insulin  release eventually will improve metabolic acidosis and hyperglycemia as well. - Continue to check POC blood glucose every 4 hours with sliding scale insulin  as well. -Check BMP every 4 hours.  Insulin -dependent DM type II Hyperglycemia -Currently patient is not in DKA or HHS.  Previous A1c 12.5 in April 2025.  Patient has history of recurrent noncompliance with insulin  regimen at home. - Continue long-acting insulin  28 units in the morning, starting sliding scale insulin  with POC blood glucose check every 4 hours. -Holding metformin  in the setting of nausea vomiting.   Acute kidney injury -Elevated creatinine 1.4.  Prerenal acute kidney injury in the setting of dehydration from nausea vomiting and poor oral intake. - Checking UA, urine creatinine sodium - In the ED patient received 1 L of NS bolus.  Currently on maintenance fluid D5 NS.  Alcohol withdrawal History of chronic alcohol use -History of binge drinking last drinking was 2 days ago. -Continue CIWA protocol with IV Ativan  as needed.  Continue thiamine , folic acid  and multivitamin.  Hyponatremia - Low sodium 131.  Hyponatremia in the setting of chronic alcohol use and poor oral intake from nausea/vomiting.   -Received 1 L of NS in the ED.  Continue to check electrolytes.   GERD - Patient is complaining about heartburn.  Denies any hematemesis.  Continue Protonix  daily and Maalox as needed   Peripheral neuropathy -Continue Lyrica  twice daily.   DVT prophylaxis:   Lovenox  Code Status:  Full Code Diet: Heart healthy diet Family Communication:   Family was present at bedside, at the time of interview. Opportunity was given to ask question and all questions were answered satisfactorily.  Disposition Plan: To monitor improvement of bicarb level and anion gap. Consults: Case management Admission status:   Observation, Telemetry bed  Severity of Illness: The appropriate patient status for this patient is OBSERVATION. Observation status is judged to be reasonable and necessary in order to provide the required intensity of service to ensure the patient's safety. The patient's presenting symptoms, physical exam findings, and initial radiographic and laboratory data in the context of their medical condition is felt to place them at decreased risk for further clinical deterioration. Furthermore, it is anticipated that the patient will be medically stable for discharge from the hospital within 2 midnights of admission.     Atlas Crossland, MD [facility]  How to contact the [facility] Attending or Consulting provider 7A - 7P or covering provider during after hours 7P -7A, for this patient.  Check the care team in [facility] and look for a) attending/consulting [facility] provider listed and b) the [facility] team listed Log into [email or web address] and use [facility] universal password to access. If you do not have the password, please contact the hospital operator. Locate the [facility] provider you are looking for under [facility] and page to a number that you can be directly reached. If you still have difficulty reaching the provider, please page the [facility] (Director on  Call) for the Hospitalists listed on amion for assistance.  01/03/2024, 10:23 PM           [1] No Known Allergies  "

## 2024-01-04 DIAGNOSIS — E8729 Other acidosis: Secondary | ICD-10-CM | POA: Diagnosis not present

## 2024-01-04 LAB — BASIC METABOLIC PANEL WITH GFR
Anion gap: 20 — ABNORMAL HIGH (ref 5–15)
Anion gap: 19 — ABNORMAL HIGH (ref 5–15)
BUN: 21 mg/dL — ABNORMAL HIGH (ref 6–20)
BUN: 21 mg/dL — ABNORMAL HIGH (ref 6–20)
CO2: 20 mmol/L — ABNORMAL LOW (ref 22–32)
CO2: 20 mmol/L — ABNORMAL LOW (ref 22–32)
Calcium: 9 mg/dL (ref 8.9–10.3)
Calcium: 8.9 mg/dL (ref 8.9–10.3)
Chloride: 90 mmol/L — ABNORMAL LOW (ref 98–111)
Chloride: 93 mmol/L — ABNORMAL LOW (ref 98–111)
Creatinine, Ser: 1.27 mg/dL — ABNORMAL HIGH (ref 0.44–1.00)
Creatinine, Ser: 1.24 mg/dL — ABNORMAL HIGH (ref 0.44–1.00)
GFR, Estimated: 56 mL/min — ABNORMAL LOW
GFR, Estimated: 58 mL/min — ABNORMAL LOW
Glucose, Bld: 265 mg/dL — ABNORMAL HIGH (ref 70–99)
Glucose, Bld: 282 mg/dL — ABNORMAL HIGH (ref 70–99)
Potassium: 4.7 mmol/L (ref 3.5–5.1)
Potassium: 4.7 mmol/L (ref 3.5–5.1)
Sodium: 130 mmol/L — ABNORMAL LOW (ref 135–145)
Sodium: 132 mmol/L — ABNORMAL LOW (ref 135–145)

## 2024-01-04 LAB — HEPATIC FUNCTION PANEL
ALT: 38 U/L (ref 0–44)
AST: 35 U/L (ref 15–41)
Albumin: 4.2 g/dL (ref 3.5–5.0)
Alkaline Phosphatase: 95 U/L (ref 38–126)
Bilirubin, Direct: 0.4 mg/dL — ABNORMAL HIGH (ref 0.0–0.2)
Indirect Bilirubin: 0.9 mg/dL (ref 0.3–0.9)
Total Bilirubin: 1.3 mg/dL — ABNORMAL HIGH (ref 0.0–1.2)
Total Protein: 7.7 g/dL (ref 6.5–8.1)

## 2024-01-04 LAB — HIV ANTIBODY (ROUTINE TESTING W REFLEX): HIV Screen 4th Generation wRfx: NONREACTIVE

## 2024-01-04 LAB — GLUCOSE, CAPILLARY
Glucose-Capillary: 264 mg/dL — ABNORMAL HIGH (ref 70–99)
Glucose-Capillary: 246 mg/dL — ABNORMAL HIGH (ref 70–99)
Glucose-Capillary: 239 mg/dL — ABNORMAL HIGH (ref 70–99)
Glucose-Capillary: 250 mg/dL — ABNORMAL HIGH (ref 70–99)
Glucose-Capillary: 224 mg/dL — ABNORMAL HIGH (ref 70–99)

## 2024-01-04 LAB — BETA-HYDROXYBUTYRIC ACID
Beta-Hydroxybutyric Acid: >8 mmol/L — ABNORMAL HIGH (ref 0.05–0.27)
Beta-Hydroxybutyric Acid: 4.8 mmol/L — ABNORMAL HIGH (ref 0.05–0.27)

## 2024-01-04 LAB — CBC
HCT: 34.9 % — ABNORMAL LOW (ref 36.0–46.0)
Hemoglobin: 12.2 g/dL (ref 12.0–15.0)
MCH: 28 pg (ref 26.0–34.0)
MCHC: 35 g/dL (ref 30.0–36.0)
MCV: 80.2 fL (ref 80.0–100.0)
Platelets: 448 K/uL — ABNORMAL HIGH (ref 150–400)
RBC: 4.35 MIL/uL (ref 3.87–5.11)
RDW: 13.7 % (ref 11.5–15.5)
WBC: 11.2 K/uL — ABNORMAL HIGH (ref 4.0–10.5)
nRBC: 0 % (ref 0.0–0.2)

## 2024-01-04 LAB — MAGNESIUM: Magnesium: 1.6 mg/dL — ABNORMAL LOW (ref 1.7–2.4)

## 2024-01-04 LAB — PHOSPHORUS: Phosphorus: 2.3 mg/dL — ABNORMAL LOW (ref 2.5–4.6)

## 2024-01-04 MED ORDER — MAGNESIUM SULFATE 2 GM/50ML IV SOLN
2.0000 g | Freq: Once | INTRAVENOUS | Status: AC
  Administered 2024-01-04: 2 g via INTRAVENOUS
  Filled 2024-01-04: qty 50

## 2024-01-04 MED ORDER — SODIUM CHLORIDE 0.9 % IV SOLN: INTRAVENOUS | Status: AC

## 2024-01-04 MED ORDER — INSULIN ASPART 100 UNIT/ML IJ SOLN
0.0000 [IU] | Freq: Three times a day (TID) | INTRAMUSCULAR | Status: DC
  Administered 2024-01-04 – 2024-01-05 (×3): 3 [IU] via SUBCUTANEOUS
  Filled 2024-01-04 (×3): qty 3

## 2024-01-04 MED ORDER — ALUM & MAG HYDROXIDE-SIMETH 200-200-20 MG/5ML PO SUSP
30.0000 mL | ORAL | Status: DC | PRN
  Administered 2024-01-04 – 2024-01-05 (×4): 30 mL via ORAL
  Filled 2024-01-04 (×3): qty 30

## 2024-01-04 MED ORDER — INSULIN ASPART 100 UNIT/ML IJ SOLN
0.0000 [IU] | Freq: Every day | INTRAMUSCULAR | Status: DC
  Administered 2024-01-04: 2 [IU] via SUBCUTANEOUS
  Filled 2024-01-04: qty 2

## 2024-01-04 MED ORDER — SODIUM PHOSPHATES 45 MMOLE/15ML IV SOLN
20.0000 mmol | Freq: Once | INTRAVENOUS | Status: AC
  Administered 2024-01-04: 20 mmol via INTRAVENOUS
  Filled 2024-01-04: qty 6.67

## 2024-01-04 MED ORDER — PROCHLORPERAZINE EDISYLATE 10 MG/2ML IJ SOLN
5.0000 mg | Freq: Four times a day (QID) | INTRAMUSCULAR | Status: DC | PRN
  Administered 2024-01-05: 5 mg via INTRAVENOUS
  Filled 2024-01-04: qty 2

## 2024-01-04 MED ORDER — POTASSIUM PHOSPHATES 15 MMOLE/5ML IV SOLN: 20.0000 mmol | Freq: Once | INTRAVENOUS | Status: DC

## 2024-01-04 NOTE — Progress Notes (Signed)
 " PROGRESS NOTE    Yolanda ANAGNOS  FMW:[id number] DOB: 09-27-1985 DOA: 01/03/2024 PCP: Vicci Barnie [name], MD    Brief Narrative:  38 year old with history of chronic alcohol use disorder, cyclic vomiting syndrome, type 2 diabetes on insulin , previous pancreatitis who had binge drinking starting her 38th birthday, about 3 days ago she started having epigastric pain nausea and vomiting, unable to eat.  Also not taking insulin  because she was not eating.  At the emergency room tachycardic with heart rate 127, blood glucose 364, bicarb 16, creatinine 1.4, anion gap 37.  Normal lipase.  Admitted due to significant symptoms of dehydration, persistent nausea and elevated blood sugars.  Subjective: Patient seen and examined in the morning rounds.  She feels much better.  Denies any abdominal pain.  She is eating carb controlled diet, she had some nausea and Zofran  did not help.  Denies any obvious vomiting.  She has acid reflux.   Assessment & Plan:   Intractable nausea vomiting, alcoholic ketoacidosis, alcohol induced gastritis: Some improvement of symptoms, however still very high risk of dehydration. Continue maintenance IV fluids. Continue adequate nausea medications. Continue to challenge with carb controlled diet. Monitor electrolytes and replace as needed. Continue Protonix .  Type 2 diabetes with hyperglycemia: Resume long-acting insulin  and sliding scale insulin .  Holding metformin .  Blood sugars are better today.  AKI: Due to above.  Improving.  Hypomagnesemia Hypophosphatemia Replace aggressively.  Monitor levels.  Alcohol use disorder with risk of alcohol withdrawal: Reports intermittent use of alcohol and denies previous alcohol withdrawal syndromes. CIWA protocol.  Close monitoring.  Benzodiazepine based CIWA protocol.  Multivitamins.  Extensive counseling done.  GERD: As above on PPI.  Maalox.  Peripheral neuropathy: On Lyrica .  Continued.     DVT prophylaxis: SCDs  Start: 01/03/24 2147 Place TED hose Start: 01/03/24 2147   Code Status: Full code Family Communication: None at the bedside Disposition Plan: Status is: Observation The patient will require care spanning > 2 midnights and should be moved to inpatient because: Significant dietary intolerance, IV fluids required     Consultants:  None  Procedures:  None  Antimicrobials:  None     Objective: Vitals:   01/03/24 2324 01/04/24 0407 01/04/24 0700 01/04/24 0916  BP: (!) 129/92 126/85  (!) 131/94  Pulse: 96 (!) 117  84  Resp: 18  (!) 25 16  Temp: 98.2 F (36.8 C) 97.7 F (36.5 C)  97.8 F (36.6 C)  TempSrc: Oral Oral  Oral  SpO2: 100% 99%  100%  Weight:      Height:        Intake/Output Summary (Last 24 hours) at 01/04/2024 1353 Last data filed at 01/04/2024 9371 Gross per 24 hour  Intake 2821.73 ml  Output --  Net 2821.73 ml   Filed Weights   01/03/24 1805  Weight: 103.4 kg    Examination:  General exam: Appears calm and comfortable. Respiratory system: Clear to auscultation. Respiratory effort normal.  No added sounds. Cardiovascular system: S1 & S2 heard, RRR. No JVD, murmurs, rubs, gallops or clicks.  Gastrointestinal system: Soft.  Nontender.  Bowel sound present. Central nervous system: Alert and oriented. No focal neurological deficits.    Data Reviewed: I have personally reviewed following labs and imaging studies  CBC: Recent Labs  Lab 01/03/24 1845 01/04/24 0217  WBC 11.7* 11.2*  NEUTROABS 10.0*  --   HGB 13.6 12.2  HCT 41.2 34.9*  MCV 83.1 80.2  PLT 590* 448*   Basic Metabolic  Panel: Recent Labs  Lab 01/03/24 1845 01/04/24 0217 01/04/24 0340 01/04/24 0341  NA 131* 130*  --  132*  K 5.0 4.7  --  4.7  CL 78* 90*  --  93*  CO2 16* 20*  --  20*  GLUCOSE 342* 265*  --  282*  BUN 24* 21*  --  21*  CREATININE 1.40* 1.27*  --  1.24*  CALCIUM  9.5 9.0  --  8.9  MG  --   --  1.6*  --   PHOS  --   --  2.3*  --    GFR: Estimated  Creatinine Clearance: 77.5 mL/min (A) (by C-G formula based on SCr of 1.24 mg/dL (H)). Liver Function Tests: Recent Labs  Lab 01/03/24 1845 01/04/24 0341  AST 56* 35  ALT 54* 38  ALKPHOS 116 95  BILITOT 1.4* 1.3*  PROT 9.0* 7.7  ALBUMIN  4.7 4.2   Recent Labs  Lab 01/03/24 1845  LIPASE 12   No results for input(s): AMMONIA in the last 168 hours. Coagulation Profile: No results for input(s): INR, PROTIME in the last 168 hours. Cardiac Enzymes: No results for input(s): CKTOTAL, CKMB, CKMBINDEX, TROPONINI in the last 168 hours. BNP (last 3 results) No results for input(s): PROBNP in the last 8760 hours. HbA1C: No results for input(s): HGBA1C in the last 72 hours. CBG: Recent Labs  Lab 01/03/24 2207 01/03/24 2343 01/04/24 0405 01/04/24 0756 01/04/24 1156  GLUCAP 266* 219* 264* 246* 239*   Lipid Profile: No results for input(s): CHOL, HDL, LDLCALC, TRIG, CHOLHDL, LDLDIRECT in the last 72 hours. Thyroid Function Tests: No results for input(s): TSH, T4TOTAL, FREET4, T3FREE, THYROIDAB in the last 72 hours. Anemia Panel: No results for input(s): VITAMINB12, FOLATE, FERRITIN, TIBC, IRON, RETICCTPCT in the last 72 hours. Sepsis Labs: No results for input(s): PROCALCITON, LATICACIDVEN in the last 168 hours.  No results found for this or any previous visit (from the past 240 hours).       Radiology Studies: No results found.      Scheduled Meds:  enoxaparin  (LOVENOX ) injection  50 mg Subcutaneous Q24H   folic acid   1 mg Oral Daily   insulin  aspart  0-6 Units Subcutaneous Q4H   insulin  glargine  22 Units Subcutaneous Daily   multivitamin with minerals  1 tablet Oral Daily   pantoprazole   40 mg Oral Daily   pregabalin   50 mg Oral BID   sodium chloride  flush  3 mL Intravenous Q12H   sodium chloride  flush  3 mL Intravenous Q12H   thiamine   100 mg Oral Daily   Or   thiamine   100 mg Intravenous Daily    Continuous Infusions:  sodium chloride      sodium chloride  75 mL/hr at 01/04/24 1032   sodium PHOSPHATE  IVPB (in mmol) 20 mmol (01/04/24 1231)     LOS: 0 days       Renato Applebaum, MD [facility]   "
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
## 2024-01-04 NOTE — Plan of Care (Incomplete)
" °  Problem: Education: Goal: Ability to describe self-care measures that may prevent or decrease complications (Diabetes Survival Skills Education) will improve Outcome: Progressing   Problem: Coping: Goal: Ability to adjust to condition or change in health will improve Outcome: Progressing   Problem: Fluid Volume: Goal: Ability to maintain a balanced intake and output will improve Outcome: Progressing   Problem: Health Behavior/Discharge Planning: Goal: Ability to identify and utilize available resources and services will improve Outcome: Progressing Goal: Ability to manage health-related needs will improve Outcome: Progressing   Problem: Metabolic: Goal: Ability to maintain appropriate glucose levels will improve Outcome: Progressing   Problem: Nutritional: Goal: Maintenance of adequate nutrition will improve Outcome: Progressing Goal: Progress toward achieving an optimal weight will improve Outcome: Progressing   Problem: Education: Goal: Ability to describe self-care measures that may prevent or decrease complications (Diabetes Survival Skills Education) will improve Outcome: Progressing   Problem: Skin Integrity: Goal: Risk for impaired skin integrity will decrease Outcome: Adequate for Discharge   Problem: Tissue Perfusion: Goal: Adequacy of tissue perfusion will improve Outcome: Adequate for Discharge   Problem: Cardiac: Goal: Ability to maintain an adequate cardiac output will improve Outcome: Adequate for Discharge   "

## 2024-01-04 NOTE — Inpatient Diabetes Management (Signed)
 Inpatient Diabetes Program Recommendations  AACE/ADA: New Consensus Statement on Inpatient Glycemic Control (2015)  Target Ranges:  Prepandial:   less than 140 mg/dL      Peak postprandial:   less than 180 mg/dL (1-2 hours)      Critically ill patients:  140 - 180 mg/dL   Lab Results  Component Value Date   GLUCAP 239 (H) 01/04/2024   HGBA1C 11.8 (A) 10/22/2023    Review of Glycemic Control  Latest Reference Range & Units 01/03/24 23:43 01/04/24 04:05 01/04/24 07:56 01/04/24 11:56  Glucose-Capillary 70 - 99 mg/dL 780 (H) 735 (H) 753 (H) 239 (H)   Diabetes history: DM 2 Outpatient Diabetes medications:  Basaglar  28 units daily Humalog  8 units tid with meals Metformin  1000 mg bid Current orders for Inpatient glycemic control:  Novolog  0-6 units q 4 hours Lantus  22 units daily Inpatient Diabetes Program Recommendations:    Agree with the addition of Lantus  this morning. If appropriate, consider adding Novolog  3 units tid with meals.   - A1C slightly better than April, 2025. Patient was seen by PCP in October of 2025 (when A1C was done) and changes made.  Will need close f/u with PCP.  Thanks,  Randall Bullocks, RN, BC-ADM Inpatient Diabetes Coordinator Pager [phone]  (8a-5p)

## 2024-01-05 ENCOUNTER — Other Ambulatory Visit (HOSPITAL_COMMUNITY): Payer: Self-pay

## 2024-01-05 DIAGNOSIS — E8729 Other acidosis: Secondary | ICD-10-CM | POA: Diagnosis not present

## 2024-01-05 LAB — CBC WITH DIFFERENTIAL/PLATELET
Abs Immature Granulocytes: 0.01 K/uL (ref 0.00–0.07)
Basophils Absolute: 0.1 K/uL (ref 0.0–0.1)
Basophils Relative: 1 %
Eosinophils Absolute: 0.2 K/uL (ref 0.0–0.5)
Eosinophils Relative: 3 %
HCT: 33 % — ABNORMAL LOW (ref 36.0–46.0)
Hemoglobin: 11.4 g/dL — ABNORMAL LOW (ref 12.0–15.0)
Immature Granulocytes: 0 %
Lymphocytes Relative: 35 %
Lymphs Abs: 1.9 K/uL (ref 0.7–4.0)
MCH: 28.1 pg (ref 26.0–34.0)
MCHC: 34.5 g/dL (ref 30.0–36.0)
MCV: 81.3 fL (ref 80.0–100.0)
Monocytes Absolute: 0.5 K/uL (ref 0.1–1.0)
Monocytes Relative: 10 %
Neutro Abs: 2.7 K/uL (ref 1.7–7.7)
Neutrophils Relative %: 51 %
Platelets: 362 K/uL (ref 150–400)
RBC: 4.06 MIL/uL (ref 3.87–5.11)
RDW: 13.8 % (ref 11.5–15.5)
WBC: 5.4 K/uL (ref 4.0–10.5)
nRBC: 0 % (ref 0.0–0.2)

## 2024-01-05 LAB — COMPREHENSIVE METABOLIC PANEL WITH GFR
ALT: 44 U/L (ref 0–44)
AST: 53 U/L — ABNORMAL HIGH (ref 15–41)
Albumin: 3.7 g/dL (ref 3.5–5.0)
Alkaline Phosphatase: 84 U/L (ref 38–126)
Anion gap: 11 (ref 5–15)
BUN: 9 mg/dL (ref 6–20)
CO2: 23 mmol/L (ref 22–32)
Calcium: 8.7 mg/dL — ABNORMAL LOW (ref 8.9–10.3)
Chloride: 99 mmol/L (ref 98–111)
Creatinine, Ser: 0.94 mg/dL (ref 0.44–1.00)
GFR, Estimated: >60 mL/min
Glucose, Bld: 207 mg/dL — ABNORMAL HIGH (ref 70–99)
Potassium: 3.9 mmol/L (ref 3.5–5.1)
Sodium: 133 mmol/L — ABNORMAL LOW (ref 135–145)
Total Bilirubin: 0.8 mg/dL (ref 0.0–1.2)
Total Protein: 7.1 g/dL (ref 6.5–8.1)

## 2024-01-05 LAB — PHOSPHORUS: Phosphorus: 1.3 mg/dL — ABNORMAL LOW (ref 2.5–4.6)

## 2024-01-05 LAB — GLUCOSE, CAPILLARY
Glucose-Capillary: 185 mg/dL — ABNORMAL HIGH (ref 70–99)
Glucose-Capillary: 177 mg/dL — ABNORMAL HIGH (ref 70–99)
Glucose-Capillary: 106 mg/dL — ABNORMAL HIGH (ref 70–99)

## 2024-01-05 LAB — MAGNESIUM: Magnesium: 2.4 mg/dL (ref 1.7–2.4)

## 2024-01-05 MED ORDER — OMEPRAZOLE 20 MG PO CPDR
20.0000 mg | DELAYED_RELEASE_CAPSULE | Freq: Two times a day (BID) | ORAL | 2 refills | Status: AC
  Filled 2024-01-05: qty 60, 30d supply, fill #0

## 2024-01-05 MED ORDER — ONDANSETRON HCL 4 MG PO TABS
4.0000 mg | ORAL_TABLET | Freq: Three times a day (TID) | ORAL | 0 refills | Status: AC | PRN
  Filled 2024-01-05: qty 20, 7d supply, fill #0

## 2024-01-05 MED ORDER — SODIUM PHOSPHATES 45 MMOLE/15ML IV SOLN
30.0000 mmol | Freq: Once | INTRAVENOUS | Status: AC
  Administered 2024-01-05: 30 mmol via INTRAVENOUS
  Filled 2024-01-05: qty 10

## 2024-01-05 MED ORDER — CARMEX CLASSIC LIP BALM EX OINT
TOPICAL_OINTMENT | CUTANEOUS | Status: DC | PRN
  Filled 2024-01-05: qty 10

## 2024-01-05 MED ORDER — K PHOS MONO-SOD PHOS DI & MONO 155-852-130 MG PO TABS
500.0000 mg | ORAL_TABLET | Freq: Two times a day (BID) | ORAL | 0 refills | Status: AC
  Filled 2024-01-05: qty 28, 7d supply, fill #0

## 2024-01-05 NOTE — Plan of Care (Incomplete)
" °  Problem: Education: Goal: Ability to describe self-care measures that may prevent or decrease complications (Diabetes Survival Skills Education) will improve Outcome: Adequate for Discharge Goal: Individualized Educational Video(s) Outcome: Adequate for Discharge   Problem: Coping: Goal: Ability to adjust to condition or change in health will improve Outcome: Adequate for Discharge   Problem: Fluid Volume: Goal: Ability to maintain a balanced intake and output will improve Outcome: Adequate for Discharge   Problem: Health Behavior/Discharge Planning: Goal: Ability to identify and utilize available resources and services will improve Outcome: Adequate for Discharge Goal: Ability to manage health-related needs will improve Outcome: Adequate for Discharge   Problem: Metabolic: Goal: Ability to maintain appropriate glucose levels will improve Outcome: Adequate for Discharge   Problem: Nutritional: Goal: Maintenance of adequate nutrition will improve Outcome: Adequate for Discharge   Problem: Skin Integrity: Goal: Risk for impaired skin integrity will decrease Outcome: Adequate for Discharge   Problem: Tissue Perfusion: Goal: Adequacy of tissue perfusion will improve Outcome: Adequate for Discharge   "

## 2024-01-05 NOTE — Discharge Summary (Signed)
 Physician Discharge Summary  Yolanda Flores FMW:[id number] DOB: [birth year] DOA: 01/03/2024  PCP: Yolanda Barnie [name], MD  Admit date: 01/03/2024 Discharge date: 01/05/2024  Admitted From: Home Disposition: Home  Recommendations for Outpatient Follow-up:  Follow up with PCP in 1-2 weeks Please obtain BMP/CBC/magnesium /phosphorus in one week   Discharge Condition: Stable CODE STATUS: Full code Diet recommendation: Low-carb diet, no alcohol  Discharge summary: 38 year old with history of chronic alcohol use disorder, cyclic vomiting syndrome, type 2 diabetes on insulin , previous pancreatitis who had binge drinking starting her 38th birthday, about 3 days ago she started having epigastric pain nausea and vomiting, unable to eat.  Also not taking insulin  because she was not eating.  At the emergency room tachycardic with heart rate 127, blood glucose 364, bicarb 16, creatinine 1.4, anion gap 37.  Normal lipase.  Admitted due to significant symptoms of dehydration, persistent nausea and elevated blood sugars.  Treated symptomatically with IV fluids and PPI.  Symptoms improved.  Able to go home today.  Detailed discussion about alcohol cessation.   Intractable nausea vomiting, alcoholic ketoacidosis, likely alcohol induced gastritis: Treated with PPI, IV fluids.  Symptoms improved.  Tolerating carb controlled diet.  Electrolytes are adequate.  Renal functions are improved. Short course of Zofran  was prescribed. Patient is on Prilosec , she will take 20 mg twice daily for 1 month and then go back on once daily.  Also can take Maalox.   Type 2 diabetes with hyperglycemia: Resume long-acting insulin  and sliding scale insulin  and metformin .  AKI: Due to above.  Normalized.   Hypomagnesemia, replaced and adequate. Hypophosphatemia, phosphorus 1.3 today.  She received 30 mmol of phosphorus IV before discharge today.  Will also prescribe oral phosphorus.  Will need recheck in 1 week.    Alcohol  use disorder with risk of alcohol withdrawal: So far uncomplicated.  Counseling done.  Resources provided.   Peripheral neuropathy: On Lyrica .  Continued.  Tolerating diet.  Electrolytes replaced.  Stable to discharge home today with close outpatient follow-up.  Will need repeat electrolyte monitoring in 1 week.  Discharge Diagnoses:  Principal Problem:   Alcoholic ketoacidosis Active Problems:   Insulin  dependent type 2 diabetes mellitus (HCC)   AKI (acute kidney injury)   Nausea & vomiting   Alcohol withdrawal (HCC)   GERD (gastroesophageal reflux disease)   Peripheral neuropathy    Discharge Instructions  Discharge Instructions     Diet Carb Modified   Complete by: As directed    Increase activity slowly   Complete by: As directed       Allergies as of 01/05/2024   No Known Allergies      Medication List     TAKE these medications    alum & mag hydroxide-simeth 200-200-20 MG/5ML suspension Commonly known as: MAALOX/MYLANTA Take 30 mLs by mouth every 6 (six) hours as needed for indigestion or heartburn.   aspirin -acetaminophen -caffeine  250-250-65 MG tablet Commonly known as: EXCEDRIN  MIGRAINE Take 2 tablets by mouth every 6 (six) hours as needed for headache.   Basaglar  KwikPen 100 UNIT/ML Inject 28 Units into the skin daily. What changed: how much to take   calcium  carbonate 500 MG chewable tablet Commonly known as: TUMS - dosed in mg elemental calcium  Chew 1 tablet by mouth daily as needed for indigestion or heartburn.   FreeStyle Libre 3 Sensor Misc Change Q 2 weeks   HumaLOG  KwikPen 200 UNIT/ML KwikPen Generic drug: insulin  lispro 8 units TID subcut with meals What changed:  how much to  take how to take this when to take this additional instructions   lisinopril  20 MG tablet Commonly known as: ZESTRIL  Take 1 tablet (20 mg total) by mouth daily.   metFORMIN  1000 MG tablet Commonly known as: GLUCOPHAGE  Take 1 tablet (1,000 mg total) by  mouth 2 (two) times daily with a meal.   multivitamin with minerals Tabs tablet Take 1 tablet by mouth daily.   omeprazole  20 MG capsule Commonly known as: PRILOSEC  Take 1 capsule (20 mg total) by mouth 2 (two) times daily before a meal. What changed: See the new instructions.   ondansetron  4 MG tablet Commonly known as: Zofran  Take 1 tablet (4 mg total) by mouth every 8 (eight) hours as needed for nausea or vomiting.   Phospha 250 Neutral 155-852-130 MG Tabs Take 2 tablets (500 mg total) by mouth 2 (two) times daily for 7 days.   pregabalin  50 MG capsule Commonly known as: LYRICA  TAKE 1 CAPSULE(50 MG) BY MOUTH TWICE DAILY   rosuvastatin  5 MG tablet Commonly known as: Crestor  5 mg po Q M-W-F What changed:  how much to take how to take this when to take this additional instructions   thiamine  100 MG tablet Commonly known as: VITAMIN B1 Take 1 tablet (100 mg total) by mouth daily.        Allergies[1]  Consultations: None   Procedures/Studies: No results found. (Echo, Carotid, EGD, Colonoscopy, ERCP)    Subjective: Patient seen in the morning rounds.  Occasional nausea but able to eat regular food.  Denies any abdominal pain or diarrhea.  Blood sugars are adequate.  She is comfortable with plan to go home with outpatient follow-up.   Discharge Exam: Vitals:   01/05/24 0455 01/05/24 1311  BP: (!) 125/91 (!) 115/93  Pulse: 62 81  Resp:  16  Temp: 97.9 F (36.6 C) 98.4 F (36.9 C)  SpO2: 100% 99%   Vitals:   01/04/24 1422 01/04/24 2013 01/05/24 0455 01/05/24 1311  BP: (!) 127/90 116/89 (!) 125/91 (!) 115/93  Pulse: 99 69 62 81  Resp: 18   16  Temp: 98.2 F (36.8 C) 98.5 F (36.9 C) 97.9 F (36.6 C) 98.4 F (36.9 C)  TempSrc: Oral Oral Oral Oral  SpO2: 100% 98% 100% 99%  Weight:      Height:        General: Pt is alert, awake, not in acute distress Cardiovascular: RRR, S1/S2 +, no rubs, no gallops Respiratory: CTA bilaterally, no wheezing, no  rhonchi Abdominal: Soft, NT, ND, bowel sounds + Extremities: no edema, no cyanosis    The results of significant diagnostics from this hospitalization (including imaging, microbiology, ancillary and laboratory) are listed below for reference.     Microbiology: No results found for this or any previous visit (from the past 240 hours).   Labs: BNP (last 3 results) No results for input(s): BNP in the last 8760 hours. Basic Metabolic Panel: Recent Labs  Lab 01/03/24 1845 01/04/24 0217 01/04/24 0340 01/04/24 0341 01/05/24 0429  NA 131* 130*  --  132* 133*  K 5.0 4.7  --  4.7 3.9  CL 78* 90*  --  93* 99  CO2 16* 20*  --  20* 23  GLUCOSE 342* 265*  --  282* 207*  BUN 24* 21*  --  21* 9  CREATININE 1.40* 1.27*  --  1.24* 0.94  CALCIUM  9.5 9.0  --  8.9 8.7*  MG  --   --  1.6*  --  2.4  PHOS  --   --  2.3*  --  1.3*   Liver Function Tests: Recent Labs  Lab 01/03/24 1845 01/04/24 0341 01/05/24 0429  AST 56* 35 53*  ALT 54* 38 44  ALKPHOS 116 95 84  BILITOT 1.4* 1.3* 0.8  PROT 9.0* 7.7 7.1  ALBUMIN  4.7 4.2 3.7   Recent Labs  Lab 01/03/24 1845  LIPASE 12   No results for input(s): AMMONIA in the last 168 hours. CBC: Recent Labs  Lab 01/03/24 1845 01/04/24 0217 01/05/24 0429  WBC 11.7* 11.2* 5.4  NEUTROABS 10.0*  --  2.7  HGB 13.6 12.2 11.4*  HCT 41.2 34.9* 33.0*  MCV 83.1 80.2 81.3  PLT 590* 448* 362   Cardiac Enzymes: No results for input(s): CKTOTAL, CKMB, CKMBINDEX, TROPONINI in the last 168 hours. BNP: Invalid input(s): POCBNP CBG: Recent Labs  Lab 01/04/24 1156 01/04/24 1615 01/04/24 2110 01/05/24 0745 01/05/24 1116  GLUCAP 239* 250* 224* 185* 177*   D-Dimer No results for input(s): DDIMER in the last 72 hours. Hgb A1c No results for input(s): HGBA1C in the last 72 hours. Lipid Profile No results for input(s): CHOL, HDL, LDLCALC, TRIG, CHOLHDL, LDLDIRECT in the last 72 hours. Thyroid function studies No  results for input(s): TSH, T4TOTAL, T3FREE, THYROIDAB in the last 72 hours.  Invalid input(s): FREET3 Anemia work up No results for input(s): VITAMINB12, FOLATE, FERRITIN, TIBC, IRON, RETICCTPCT in the last 72 hours. Urinalysis    Component Value Date/Time   COLORURINE STRAW (A) 01/03/2024 2252   APPEARANCEUR CLEAR 01/03/2024 2252   LABSPEC 1.010 01/03/2024 2252   PHURINE 5.0 01/03/2024 2252   GLUCOSEU >=500 (A) 01/03/2024 2252   HGBUR SMALL (A) 01/03/2024 2252   BILIRUBINUR NEGATIVE 01/03/2024 2252   KETONESUR 80 (A) 01/03/2024 2252   PROTEINUR 100 (A) 01/03/2024 2252   UROBILINOGEN 2.0 (H) 12/31/2009 1757   NITRITE NEGATIVE 01/03/2024 2252   LEUKOCYTESUR NEGATIVE 01/03/2024 2252   Sepsis Labs Recent Labs  Lab 01/03/24 1845 01/04/24 0217 01/05/24 0429  WBC 11.7* 11.2* 5.4   Microbiology No results found for this or any previous visit (from the past 240 hours).   Time coordinating discharge: 35 minutes  SIGNED:   Renato Applebaum, MD  [facility] 01/05/2024, 2:06 PM     [1] No Known Allergies

## 2024-01-05 NOTE — Progress Notes (Signed)
 Discharge mediations delivered to patient at the bedside

## 2024-01-05 NOTE — Progress Notes (Signed)
 Discharge medications delivered to patient at the bedside.

## 2024-01-05 NOTE — Inpatient Diabetes Management (Addendum)
 Inpatient Diabetes Program Recommendations  AACE/ADA: New Consensus Statement on Inpatient Glycemic Control (2015)  Target Ranges:  Prepandial:   less than 140 mg/dL      Peak postprandial:   less than 180 mg/dL (1-2 hours)      Critically ill patients:  140 - 180 mg/dL   Lab Results  Component Value Date   GLUCAP 185 (H) 01/05/2024   HGBA1C 11.8 (A) 10/22/2023    Latest Reference Range & Units 01/04/24 07:56 01/04/24 11:56 01/04/24 16:15 01/04/24 21:10 01/05/24 07:45  Glucose-Capillary 70 - 99 mg/dL 753 (H) 760 (H) 749 (H) 224 (H) 185 (H)  (H): Data is abnormally high  Diabetes history: DM 2 Outpatient Diabetes medications:  Basaglar  28 units daily Humalog  8 units tid with meals Metformin  1000 mg bid Current orders for Inpatient glycemic control:  Novolog  0-15 tid correction Lantus  22 units daily Inpatient Diabetes Program Recommendations:    Inpatient Diabetes Program Recommendations:   Postprandial CBGs elevated. Please consider: -Add Novolog  3 units tid meal coverage if eats 50% meal  Thank you, Patton Rabinovich E. Jerric Oyen, RN, MSN, CNS, CDCES  Diabetes Coordinator Inpatient Glycemic Control Team Team Pager [phone] (8am-5pm) 01/05/2024 10:37 AM

## 2024-01-06 ENCOUNTER — Telehealth: Payer: Self-pay

## 2024-01-06 NOTE — Transitions of Care (Post Inpatient/ED Visit) (Signed)
" ° °  01/06/2024  Name: Yolanda Flores MRN: [id number] DOB: Jun 06, 1985  Today's TOC FU Call Status: Today's TOC FU Call Status:: Unsuccessful Call (1st Attempt) Unsuccessful Call (1st Attempt) Date: 01/06/24  Attempted to reach the patient regarding the most recent Inpatient/ED visit.  Follow Up Plan: Additional outreach attempts will be made to reach the patient to complete the Transitions of Care (Post Inpatient/ED visit) call.   Signature  Slater Diesel, RN   "

## 2024-01-08 ENCOUNTER — Telehealth: Payer: Self-pay

## 2024-01-08 NOTE — Transitions of Care (Post Inpatient/ED Visit) (Signed)
" ° °  01/08/2024  Name: Yolanda Flores MRN: [id number] DOB: 21-Feb-1985  Today's TOC FU Call Status: Today's TOC FU Call Status:: Unsuccessful Call (3rd Attempt) Unsuccessful Call (1st Attempt) Date: 01/06/24 Unsuccessful Call (2nd Attempt) Date: 01/08/24 Unsuccessful Call (3rd Attempt) Date: 01/08/24  Attempted to reach the patient regarding the most recent Inpatient/ED visit.  Follow Up Plan: No further outreach attempts will be made at this time. We have been unable to contact the patient.  Letter sent to patient requesting she contact [facility] to schedule a follow up appointment as we have not been able to reach her.   Signature  Slater Diesel, RN   "

## 2024-01-08 NOTE — Transitions of Care (Post Inpatient/ED Visit) (Signed)
" ° °  01/08/2024  Name: Yolanda Flores MRN: [id number] DOB: 09-09-1985  Today's TOC FU Call Status: Today's TOC FU Call Status:: Unsuccessful Call (2nd Attempt) Unsuccessful Call (1st Attempt) Date: 01/06/24 Unsuccessful Call (2nd Attempt) Date: 01/08/24  Attempted to reach the patient regarding the most recent Inpatient/ED visit.  Follow Up Plan: Additional outreach attempts will be made to reach the patient to complete the Transitions of Care (Post Inpatient/ED visit) call.   Signature  Slater Diesel, RN   "

## 2024-01-09 ENCOUNTER — Other Ambulatory Visit: Payer: Self-pay | Admitting: Family Medicine

## 2024-01-09 DIAGNOSIS — R1013 Epigastric pain: Secondary | ICD-10-CM

## 2024-01-11 NOTE — Telephone Encounter (Signed)
 Copied from CRM #[id number]. Topic: General - Other >> Jan 11, 2024  4:28 PM Hadassah PARAS wrote:  Reason for CRM: Pt is returning missed call from Marvis Bradley, RN. Please advise on #[phone]

## 2024-01-12 NOTE — Telephone Encounter (Signed)
 Call to patient unable to reach. Message left.

## 2024-01-18 ENCOUNTER — Ambulatory Visit: Attending: Internal Medicine

## 2024-01-18 DIAGNOSIS — Z23 Encounter for immunization: Secondary | ICD-10-CM | POA: Diagnosis not present

## 2024-01-18 NOTE — Progress Notes (Signed)
 HPV vaccine (2nd) administered per protocols.  Information sheet given. Patient denies and pain or discomfort at injection site. Tolerated injection well no reaction.

## 2024-01-21 ENCOUNTER — Ambulatory Visit: Admitting: Internal Medicine

## 2024-02-16 ENCOUNTER — Other Ambulatory Visit: Payer: Self-pay | Admitting: Internal Medicine

## 2024-02-16 DIAGNOSIS — R1013 Epigastric pain: Secondary | ICD-10-CM

## 2024-02-18 ENCOUNTER — Encounter: Payer: Self-pay | Admitting: Internal Medicine

## 2024-02-18 ENCOUNTER — Ambulatory Visit: Admitting: Internal Medicine

## 2024-02-18 VITALS — BP 118/77 | HR 90 | Ht 67.0 in | Wt 229.6 lb

## 2024-02-18 DIAGNOSIS — F1090 Alcohol use, unspecified, uncomplicated: Secondary | ICD-10-CM

## 2024-02-18 DIAGNOSIS — Z794 Long term (current) use of insulin: Secondary | ICD-10-CM

## 2024-02-18 DIAGNOSIS — E785 Hyperlipidemia, unspecified: Secondary | ICD-10-CM

## 2024-02-18 DIAGNOSIS — E1169 Type 2 diabetes mellitus with other specified complication: Secondary | ICD-10-CM

## 2024-02-18 DIAGNOSIS — Z6835 Body mass index (BMI) 35.0-35.9, adult: Secondary | ICD-10-CM

## 2024-02-18 LAB — POCT GLYCOSYLATED HEMOGLOBIN (HGB A1C): Hemoglobin A1C: 10.3 % — AB (ref 4.0–5.6)

## 2024-02-18 MED ORDER — LISINOPRIL 20 MG PO TABS: 20.0000 mg | ORAL_TABLET | Freq: Every day | ORAL | 1 refills | Status: AC

## 2024-02-18 MED ORDER — BASAGLAR KWIKPEN 100 UNIT/ML ~~LOC~~ SOPN: 32.0000 [IU] | PEN_INJECTOR | Freq: Every day | SUBCUTANEOUS | 4 refills | Status: AC
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
## 2024-02-18 NOTE — Patient Instructions (Signed)
" °  VISIT SUMMARY: During your follow-up visit, we discussed your diabetes, recent hospitalization for alcohol-induced ketoacidosis, weight management, and hyperlipidemia. We reviewed your current medications and made some adjustments to better manage your conditions.  YOUR PLAN: -TYPE 2 DIABETES MELLITUS ASSOCIATED WITH MORBID OBESITY: Type 2 diabetes is a condition where your body does not use insulin  properly, leading to high blood sugar levels. We increased your Basaglar  to 32 units daily, continued your Humalog  at 8 units with meals, and Metformin  1000 mg twice a day. We ordered pancreatic antibodies to check for type 1 diabetes and encouraged you to use your continuous glucose monitor. Please discuss GLP-1 therapy with your endocrinologist and follow up with a diabetic eye exam. Your weight has been stable, and you have cravings for sweets and carbohydrates. You joined a gym and plan to exercise three times a week. Please continue with regular exercise and make dietary changes to reduce carbohydrate and sugar intake.  -ALCOHOL USE DISORDER WITH RECENT ALCOHOL-INDUCED KETOACIDOSIS AND GASTRITIS: Alcohol use disorder is a condition where you have difficulty controlling your alcohol consumption. You were recently hospitalized for alcohol-induced ketoacidosis and gastritis. You have been abstinent for six weeks, which is excellent. Please continue to abstain from alcohol and be aware of the risks of relapse.  -HYPERLIPIDEMIA: Hyperlipidemia is a condition where you have high levels of fats in your blood. You were switched to rosuvastatin  but have not taken it for five days due to pharmacy issues. Please resume taking rosuvastatin  as prescribed. We also ordered an electrolyte panel to check for any imbalances.  INSTRUCTIONS: Please follow up with your endocrinologist to discuss GLP-1 therapy and have a diabetic eye exam. Resume taking rosuvastatin  as prescribed and continue with your exercise and dietary  plans. Use your continuous glucose monitor regularly and monitor your blood sugar levels. Abstain from alcohol and be aware of the risks of relapse.    Contains text generated by [employer].   "

## 2024-02-19 LAB — IA-2 AUTOANTIBODIES

## 2024-02-19 LAB — GLUTAMIC ACID DECARBOXYLASE AUTO ABS: Glutamic Acid Decarb Ab: <5 U/mL (ref 0.0–5.0)

## 2024-02-25 ENCOUNTER — Ambulatory Visit

## 2024-03-18 ENCOUNTER — Ambulatory Visit: Admitting: Endocrinology

## 2024-06-17 ENCOUNTER — Ambulatory Visit: Payer: Self-pay | Admitting: Internal Medicine
